# Patient Record
Sex: Female | Born: 1960
Health system: Southern US, Community
[De-identification: ages and names within clinical notes are randomized; demographics above are authoritative.]

## PROBLEM LIST (undated history)

## (undated) DIAGNOSIS — M899 Disorder of bone, unspecified: Secondary | ICD-10-CM

## (undated) DIAGNOSIS — M1990 Unspecified osteoarthritis, unspecified site: Secondary | ICD-10-CM

## (undated) DIAGNOSIS — Z860100 Personal history of colon polyps, unspecified: Secondary | ICD-10-CM

## (undated) DIAGNOSIS — T4145XA Adverse effect of unspecified anesthetic, initial encounter: Secondary | ICD-10-CM

## (undated) DIAGNOSIS — K219 Gastro-esophageal reflux disease without esophagitis: Secondary | ICD-10-CM

## (undated) DIAGNOSIS — K8012 Calculus of gallbladder with acute and chronic cholecystitis without obstruction: Principal | ICD-10-CM

## (undated) DIAGNOSIS — F329 Major depressive disorder, single episode, unspecified: Secondary | ICD-10-CM

## (undated) DIAGNOSIS — E785 Hyperlipidemia, unspecified: Secondary | ICD-10-CM

## (undated) DIAGNOSIS — I10 Essential (primary) hypertension: Secondary | ICD-10-CM

## (undated) DIAGNOSIS — N950 Postmenopausal bleeding: Secondary | ICD-10-CM

## (undated) DIAGNOSIS — G4733 Obstructive sleep apnea (adult) (pediatric): Secondary | ICD-10-CM

## (undated) DIAGNOSIS — F419 Anxiety disorder, unspecified: Secondary | ICD-10-CM

## (undated) DIAGNOSIS — K644 Residual hemorrhoidal skin tags: Secondary | ICD-10-CM

## (undated) DIAGNOSIS — K635 Polyp of colon: Secondary | ICD-10-CM

## (undated) DIAGNOSIS — K2950 Unspecified chronic gastritis without bleeding: Secondary | ICD-10-CM

## (undated) DIAGNOSIS — H269 Unspecified cataract: Secondary | ICD-10-CM

## (undated) DIAGNOSIS — T8859XA Other complications of anesthesia, initial encounter: Secondary | ICD-10-CM

## (undated) DIAGNOSIS — R7303 Prediabetes: Secondary | ICD-10-CM

## (undated) DIAGNOSIS — K8020 Calculus of gallbladder without cholecystitis without obstruction: Secondary | ICD-10-CM

## (undated) DIAGNOSIS — G4730 Sleep apnea, unspecified: Secondary | ICD-10-CM

## (undated) DIAGNOSIS — R928 Other abnormal and inconclusive findings on diagnostic imaging of breast: Secondary | ICD-10-CM

## (undated) DIAGNOSIS — R112 Nausea with vomiting, unspecified: Secondary | ICD-10-CM

## (undated) DIAGNOSIS — T7840XA Allergy, unspecified, initial encounter: Secondary | ICD-10-CM

## (undated) DIAGNOSIS — F32A Depression, unspecified: Secondary | ICD-10-CM

## (undated) DIAGNOSIS — D509 Iron deficiency anemia, unspecified: Secondary | ICD-10-CM

## (undated) DIAGNOSIS — G8929 Other chronic pain: Secondary | ICD-10-CM

## (undated) DIAGNOSIS — Z9889 Other specified postprocedural states: Secondary | ICD-10-CM

## (undated) HISTORY — DX: Allergy, unspecified, initial encounter: T78.40XA

## (undated) HISTORY — PX: OTHER SURGICAL HISTORY: SHX169

## (undated) HISTORY — PX: COLONOSCOPY WITH PROPOFOL: SHX5780

## (undated) HISTORY — DX: Unspecified cataract: H26.9

## (undated) HISTORY — DX: Polyp of colon: K63.5

## (undated) HISTORY — DX: Unspecified osteoarthritis, unspecified site: M19.90

## (undated) HISTORY — DX: Calculus of gallbladder without cholecystitis without obstruction: K80.20

## (undated) HISTORY — DX: Calculus of gallbladder with acute and chronic cholecystitis without obstruction: K80.12

## (undated) HISTORY — PX: ESOPHAGOGASTRODUODENOSCOPY (EGD) WITH PROPOFOL: SHX5813

## (undated) HISTORY — PX: BREAST BIOPSY: SHX20

## (undated) HISTORY — DX: Sleep apnea, unspecified: G47.30

## (undated) HISTORY — DX: Iron deficiency anemia, unspecified: D50.9

## (undated) HISTORY — PX: DRUG INDUCED ENDOSCOPY: SHX6808

## (undated) HISTORY — DX: Gastro-esophageal reflux disease without esophagitis: K21.9

## (undated) HISTORY — DX: Other abnormal and inconclusive findings on diagnostic imaging of breast: R92.8

## (undated) HISTORY — DX: Essential (primary) hypertension: I10

## (undated) HISTORY — PX: EYE SURGERY: SHX253

## (undated) HISTORY — DX: Disorder of bone, unspecified: M89.9

---

## 2003-09-26 ENCOUNTER — Encounter: Admission: RE | Admit: 2003-09-26 | Discharge: 2003-09-26 | Payer: Self-pay | Admitting: Internal Medicine

## 2004-05-06 ENCOUNTER — Emergency Department (HOSPITAL_COMMUNITY): Admission: EM | Admit: 2004-05-06 | Discharge: 2004-05-06 | Payer: Self-pay | Admitting: Emergency Medicine

## 2007-08-21 ENCOUNTER — Ambulatory Visit: Payer: Self-pay | Admitting: Family Medicine

## 2007-08-23 ENCOUNTER — Ambulatory Visit: Payer: Self-pay | Admitting: *Deleted

## 2007-08-23 ENCOUNTER — Ambulatory Visit (HOSPITAL_COMMUNITY): Admission: RE | Admit: 2007-08-23 | Discharge: 2007-08-23 | Payer: Self-pay | Admitting: Family Medicine

## 2007-11-02 ENCOUNTER — Ambulatory Visit: Payer: Self-pay | Admitting: Family Medicine

## 2007-11-02 LAB — CONVERTED CEMR LAB
ALT: 15 units/L (ref 0–35)
AST: 18 units/L (ref 0–37)
Albumin: 4.2 g/dL (ref 3.5–5.2)
Alkaline Phosphatase: 115 units/L (ref 39–117)
BUN: 9 mg/dL (ref 6–23)
Basophils Absolute: 0.1 10*3/uL (ref 0.0–0.1)
Basophils Relative: 1 % (ref 0–1)
CO2: 19 meq/L (ref 19–32)
Calcium: 9 mg/dL (ref 8.4–10.5)
Chloride: 105 meq/L (ref 96–112)
Creatinine, Ser: 0.74 mg/dL (ref 0.40–1.20)
Eosinophils Absolute: 0.3 10*3/uL (ref 0.0–0.7)
Eosinophils Relative: 3 % (ref 0–5)
Glucose, Bld: 79 mg/dL (ref 70–99)
HCT: 34.1 % — ABNORMAL LOW (ref 36.0–46.0)
Helicobacter Pylori Antibody-IgG: 4.4 — ABNORMAL HIGH
Hemoglobin: 10 g/dL — ABNORMAL LOW (ref 12.0–15.0)
Lymphocytes Relative: 32 % (ref 12–46)
Lymphs Abs: 2.5 10*3/uL (ref 0.7–4.0)
MCHC: 29.3 g/dL — ABNORMAL LOW (ref 30.0–36.0)
MCV: 71.6 fL — ABNORMAL LOW (ref 78.0–100.0)
Monocytes Absolute: 0.4 10*3/uL (ref 0.1–1.0)
Monocytes Relative: 6 % (ref 3–12)
Neutro Abs: 4.5 10*3/uL (ref 1.7–7.7)
Neutrophils Relative %: 58 % (ref 43–77)
Platelets: 304 10*3/uL (ref 150–400)
Potassium: 4.8 meq/L (ref 3.5–5.3)
RBC: 4.76 M/uL (ref 3.87–5.11)
RDW: 17.1 % — ABNORMAL HIGH (ref 11.5–15.5)
Sodium: 141 meq/L (ref 135–145)
Total Bilirubin: 0.3 mg/dL (ref 0.3–1.2)
Total Protein: 7.5 g/dL (ref 6.0–8.3)
WBC: 7.8 10*3/uL (ref 4.0–10.5)

## 2008-02-11 ENCOUNTER — Ambulatory Visit: Payer: Self-pay | Admitting: Family Medicine

## 2008-09-11 ENCOUNTER — Ambulatory Visit: Payer: Self-pay | Admitting: Family Medicine

## 2008-09-16 ENCOUNTER — Ambulatory Visit (HOSPITAL_COMMUNITY): Admission: RE | Admit: 2008-09-16 | Discharge: 2008-09-16 | Payer: Self-pay | Admitting: Family Medicine

## 2009-02-19 ENCOUNTER — Ambulatory Visit: Payer: Self-pay | Admitting: Family Medicine

## 2009-03-19 ENCOUNTER — Encounter: Payer: Self-pay | Admitting: Internal Medicine

## 2009-03-19 ENCOUNTER — Ambulatory Visit: Payer: Self-pay | Admitting: Internal Medicine

## 2009-03-19 ENCOUNTER — Ambulatory Visit (HOSPITAL_COMMUNITY): Admission: RE | Admit: 2009-03-19 | Discharge: 2009-03-19 | Payer: Self-pay | Admitting: Internal Medicine

## 2009-03-19 DIAGNOSIS — D509 Iron deficiency anemia, unspecified: Secondary | ICD-10-CM

## 2009-03-19 DIAGNOSIS — R0789 Other chest pain: Secondary | ICD-10-CM | POA: Insufficient documentation

## 2010-02-10 ENCOUNTER — Emergency Department (HOSPITAL_COMMUNITY): Admission: EM | Admit: 2010-02-10 | Discharge: 2010-02-10 | Payer: Self-pay | Admitting: Family Medicine

## 2010-02-12 ENCOUNTER — Ambulatory Visit: Payer: Self-pay | Admitting: Family Medicine

## 2010-02-12 LAB — CONVERTED CEMR LAB
Basophils Absolute: 0.1 10*3/uL (ref 0.0–0.1)
Basophils Relative: 1 % (ref 0–1)
Eosinophils Absolute: 0.2 10*3/uL (ref 0.0–0.7)
Eosinophils Relative: 2 % (ref 0–5)
HCT: 42 % (ref 36.0–46.0)
Hemoglobin: 13.5 g/dL (ref 12.0–15.0)
INR: 1.01 (ref <1.50)
Lymphocytes Relative: 34 % (ref 12–46)
Lymphs Abs: 2.5 10*3/uL (ref 0.7–4.0)
MCHC: 32.1 g/dL (ref 30.0–36.0)
MCV: 86.1 fL (ref 78.0–100.0)
Monocytes Absolute: 0.5 10*3/uL (ref 0.1–1.0)
Monocytes Relative: 7 % (ref 3–12)
Neutro Abs: 4.1 10*3/uL (ref 1.7–7.7)
Neutrophils Relative %: 56 % (ref 43–77)
Platelets: 300 10*3/uL (ref 150–400)
Prothrombin Time: 13.2 s (ref 11.6–15.2)
RBC: 4.88 M/uL (ref 3.87–5.11)
RDW: 13.2 % (ref 11.5–15.5)
WBC: 7.4 10*3/uL (ref 4.0–10.5)
aPTT: 30 s (ref 24–37)

## 2010-02-18 ENCOUNTER — Ambulatory Visit (HOSPITAL_COMMUNITY): Admission: RE | Admit: 2010-02-18 | Discharge: 2010-02-18 | Payer: Self-pay | Admitting: Family Medicine

## 2010-05-13 ENCOUNTER — Ambulatory Visit: Payer: Self-pay | Admitting: Family Medicine

## 2010-05-13 LAB — CONVERTED CEMR LAB
Chlamydia, DNA Probe: NEGATIVE
GC Probe Amp, Genital: NEGATIVE

## 2010-05-26 ENCOUNTER — Ambulatory Visit (HOSPITAL_COMMUNITY): Admission: RE | Admit: 2010-05-26 | Discharge: 2010-05-26 | Payer: Self-pay | Admitting: Family Medicine

## 2010-05-27 ENCOUNTER — Ambulatory Visit: Payer: Self-pay | Admitting: Internal Medicine

## 2010-05-27 ENCOUNTER — Encounter (INDEPENDENT_AMBULATORY_CARE_PROVIDER_SITE_OTHER): Payer: Self-pay | Admitting: Adult Health

## 2010-05-27 LAB — CONVERTED CEMR LAB
BUN: 8 mg/dL (ref 6–23)
CO2: 24 meq/L (ref 19–32)
Calcium: 10.1 mg/dL (ref 8.4–10.5)
Chloride: 104 meq/L (ref 96–112)
Creatinine, Ser: 0.58 mg/dL (ref 0.40–1.20)
Glucose, Bld: 96 mg/dL (ref 70–99)
Potassium: 4 meq/L (ref 3.5–5.3)
Sodium: 142 meq/L (ref 135–145)

## 2010-07-13 ENCOUNTER — Ambulatory Visit: Payer: Self-pay | Admitting: Family Medicine

## 2010-07-13 LAB — CONVERTED CEMR LAB
HCV Ab: NEGATIVE
Hep A Total Ab: POSITIVE — AB
Hep B Core Total Ab: NEGATIVE
Hep B S Ab: NEGATIVE
Rheumatoid fact SerPl-aCnc: <20 intl units/mL (ref 0–20)
Sed Rate: 1 mm/hr (ref 0–22)

## 2011-03-31 ENCOUNTER — Encounter: Payer: Self-pay | Admitting: Gastroenterology

## 2011-05-11 ENCOUNTER — Ambulatory Visit: Payer: Self-pay | Admitting: Gastroenterology

## 2011-08-25 ENCOUNTER — Other Ambulatory Visit (HOSPITAL_COMMUNITY): Payer: Self-pay | Admitting: Family Medicine

## 2011-08-25 DIAGNOSIS — R1011 Right upper quadrant pain: Secondary | ICD-10-CM

## 2011-08-29 ENCOUNTER — Ambulatory Visit (HOSPITAL_COMMUNITY)
Admission: RE | Admit: 2011-08-29 | Discharge: 2011-08-29 | Disposition: A | Discharge status: Home / Self Care | Payer: Self-pay | Source: Ambulatory Visit | Attending: Family Medicine | Admitting: Family Medicine

## 2011-08-29 DIAGNOSIS — K7689 Other specified diseases of liver: Secondary | ICD-10-CM | POA: Insufficient documentation

## 2011-08-29 DIAGNOSIS — R1011 Right upper quadrant pain: Secondary | ICD-10-CM

## 2011-09-19 ENCOUNTER — Encounter (INDEPENDENT_AMBULATORY_CARE_PROVIDER_SITE_OTHER): Payer: Self-pay | Admitting: Surgery

## 2011-09-19 ENCOUNTER — Ambulatory Visit (INDEPENDENT_AMBULATORY_CARE_PROVIDER_SITE_OTHER): Payer: PRIVATE HEALTH INSURANCE | Admitting: Surgery

## 2011-09-19 VITALS — BP 120/84 | HR 60 | Temp 97.4°F | Resp 18 | Ht 61.5 in | Wt 166.0 lb

## 2011-09-19 DIAGNOSIS — K625 Hemorrhage of anus and rectum: Secondary | ICD-10-CM | POA: Insufficient documentation

## 2011-09-19 DIAGNOSIS — K648 Other hemorrhoids: Secondary | ICD-10-CM | POA: Insufficient documentation

## 2011-09-19 DIAGNOSIS — K8010 Calculus of gallbladder with chronic cholecystitis without obstruction: Secondary | ICD-10-CM

## 2011-09-19 DIAGNOSIS — K219 Gastro-esophageal reflux disease without esophagitis: Secondary | ICD-10-CM | POA: Insufficient documentation

## 2011-09-19 DIAGNOSIS — K8012 Calculus of gallbladder with acute and chronic cholecystitis without obstruction: Secondary | ICD-10-CM | POA: Insufficient documentation

## 2011-09-19 NOTE — Progress Notes (Signed)
Subjective:     Patient ID: Susan Lopez, female   DOB: Aug 21, 1961, 50 y.o.   MRN: [id number]  HPI  Patient Care Team: Collene Leyden as PCP - General (Family Medicine)  This patient is a 50 y.o.female who presents today for surgical evaluation.   Reason for visit: Gallstones and biliary colic.  Patient is a 50 year old female who has numerous abdominal issues. She has history of reflux disease it seems to be well-controlled with omeprazole and Carafate. She also history of rectal bleeding. She's never had a colonoscopy. She struggles with chronic constipation. She occasionally will use laxatives to help her have bowel movements. She's never been on a bowel regimen.  Her main complaint has been intermittent right upper cautery postprandial abdominal pain. She often will get bloating. Seems to happen with more heavy foods but it's hard to tease out what diet bothers her more than not. She will get discomfort when she bent over well. This pain is different than heartburn or reflux. No sick contacts or travel history.  She does not speak [place]. She comes in with her niece who is bilingual. They prefer the niece to be interpreter.  Past Medical History  Diagnosis Date  . Iron deficiency anemia   . Hypertension   . Arthritis   . Gallstones     Past Surgical History  Procedure Date  . Cesarean section 1985, 1992    History   Social History  . Marital Status: Single    Spouse Name: N/A    Number of Children: N/A  . Years of Education: N/A   Occupational History  . Not on file.   Social History Main Topics  . Smoking status: Former Games developer  . Smokeless tobacco: Not on file  . Alcohol Use: No  . Drug Use: No  . Sexually Active:    Other Topics Concern  . Not on file   Social History Narrative  . No narrative on file    History reviewed. No pertinent family history.  Current outpatient prescriptions:Bisacodyl (LAXATIVE PO), Take 5 mg by mouth daily.  , Disp:  , Rfl: ;  lisinopril (PRINIVIL,ZESTRIL) 10 MG tablet, Take 10 mg by mouth daily.  , Disp: , Rfl: ;  omeprazole (PRILOSEC) 20 MG capsule, Take 20 mg by mouth daily.  , Disp: , Rfl:   No Known Allergies     Review of Systems  Constitutional: Negative for fever, chills, diaphoresis, appetite change and fatigue.  HENT: Positive for sore throat and trouble swallowing. Negative for ear pain, neck pain and ear discharge.   Eyes: Negative for photophobia, discharge and visual disturbance.  Respiratory: Negative for cough, choking, chest tightness, shortness of breath, wheezing and stridor.   Cardiovascular: Positive for chest pain. Negative for palpitations and leg swelling.       Patient walks 30 minutes for about without difficulty.  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  No exertional chest/neck/shoulder/arm pain.  Gastrointestinal: Positive for nausea, abdominal pain, constipation, abdominal distention, anal bleeding and rectal pain. Negative for vomiting and diarrhea.  Genitourinary: Negative for dysuria, urgency, frequency, decreased urine volume, vaginal bleeding, vaginal discharge and difficulty urinating.       No personal nor family history of GI/colon cancer, inflammatory bowel disease, irritable bowel syndrome, allergy such as Celiac Sprue, dietary/dairy problems, colitis, ulcers nor gastritis.    No recent sick contacts/gastroenteritis.  No travel outside the country.  No changes in diet.    Musculoskeletal: Positive for arthralgias. Negative for myalgias and gait  problem.  Skin: Negative for color change, pallor and rash.  Neurological: Negative for dizziness, speech difficulty, weakness and numbness.  Hematological: Negative for adenopathy.  Psychiatric/Behavioral: Negative for confusion and agitation. The patient is not nervous/anxious.        Objective:   Physical Exam  Constitutional: She is oriented to person, place, and time. She appears well-developed and well-nourished. No  distress.  HENT:  Head: Normocephalic.  Mouth/Throat: Oropharynx is clear and moist. No oropharyngeal exudate.  Eyes: Conjunctivae and EOM are normal. Pupils are equal, round, and reactive to light. No scleral icterus.  Neck: Normal range of motion. Neck supple. No tracheal deviation present.  Cardiovascular: Normal rate, regular rhythm and intact distal pulses.   Pulmonary/Chest: Effort normal and breath sounds normal. No respiratory distress. She exhibits no tenderness.  Abdominal: Soft. She exhibits no distension and no mass. There is no rebound and no guarding. Hernia confirmed negative in the right inguinal area and confirmed negative in the left inguinal area.       Obese.  RUQ TTP, no major Murphy's sign  Genitourinary: No vaginal discharge found.       Perianal skin clean with good hygiene.  No pruritis.  No pilonidal disease.  No fissure.  No abscess/fistula.    Tolerates digital and anoscopic rectal exam but sensitive.  Normal sphincter tone.  No rectal masses.    Hemorrhoidal piles R post > L lat mildly enlarged. R ant very mild   Musculoskeletal: Normal range of motion. She exhibits no tenderness.  Lymphadenopathy:    She has no cervical adenopathy.       Right: No inguinal adenopathy present.       Left: No inguinal adenopathy present.  Neurological: She is alert and oriented to person, place, and time. No cranial nerve deficit. She exhibits normal muscle tone. Coordination normal.  Skin: Skin is warm and dry. No rash noted. She is not diaphoretic. No erythema.  Psychiatric: She has a normal mood and affect. Her behavior is normal. Judgment and thought content normal.   Studies: A 2 cm large gallstone on ultrasound. Questionable Murphy's sign. No gallbladder wall thickening or ductal dilatation    Assessment:     Chronic cholecystitis + gallstone  GERD - controlled on meds.  No Hiatal hernia on 2009 UGI  Anorectal bleeding, most likely due to hemorrhoids    Plan:      Continue PPIs and Carafate for GERD. Consider reevaluation after cholecystectomy.  Consider laparoscopic cholecystectomy. I think she is reasonable to do a single site technique. She may need conversion to multiple port. I did discuss technique:  The anatomy & physiology of hepatobiliary & pancreatic function was discussed.  The pathophysiology of gallbladder dysfunction was discussed.  Natural history risks without surgery was discussed.   I feel the risks of no intervention will lead to serious problems that outweigh the operative risks; therefore, I recommended cholecystectomy to remove the pathology.  I explained laparoscopic techniques with possible need for an open approach.  Probable cholangiogram to evaluate the bilary tract was explained as well.    Risks such as bleeding, infection, abscess, leak, injury to other organs, need for further treatment, heart attack, death, and other risks were discussed.  I noted a good likelihood this will help address the problem.  Possibility that this will not correct all abdominal symptoms was explained.  Goals of post-operative recovery were discussed as well.  We will work to minimize complications.  An educational handout further explaining  the pathology and treatment options was given as well.  Questions were answered.  The patient expresses understanding & wishes to proceed with surgery.  Hemorrhoids most likely etiology of rectal bleeding. However, she is over 50. And his reason for a colonoscopy. I discussed that with him into recommended. They are wishing to hold off for now until after surgery. I noted rectum persistent rectal bleeding is normal. I held off on doing any bending or injections as she is rather sensitive for now and is not have a good bowel regimen.   I gave her a handout on cholecystectomy, hemorrhoids and, on bowel regimen area.  These were in Spanish as well

## 2011-09-19 NOTE — Patient Instructions (Addendum)
Colecistectoma laparoscpica  (Laparoscopic Cholecystectomy)  La colecistectoma laparoscpica es una ciruga que se realiza para extirpar la vescula biliar. Est ubicada ligeramente a la derecha del centro del vientre (abdomen ), detrs del hgado. Almacena la bilis que se produce en el hgado. La bilis interviene en la digestin y absorcin de las grasas. La enfermedad de la vescula biliar (colecistitis) es la inflamacin de la vescula biliar. Esta enfermedad se debe a la acumulacin de piedras (colelitiasis). Estas piedras obstruyen el flujo de la bilis, lo que produce inflamacin y Engineer, mining. En los [employer], podr ser [place]. Cuando no es [place] operacin de [place], usted tendr tiempo de prepararse para el procedimiento. La ciruga laparoscpica es una alternativa a la ciruga abierta convencional, y normalmente requiere menos tiempo de Customer service manager. El conducto bliliar comn tambin debe examinarse y explorarse. El mdico comentar esto con usted si considera que debe [place]. Si se encuentran clculos en el conducto comn, debern extirparse. HGALE SABER A SU MDICO:   Alergias a alimentos o medicamentos.   Medicamentos que [place], incluyendo vitaminas, hierbas, gotas oftlmicas, medicamentos de venta libre y cremas.   Uso de corticoides (por va oral o cremas).   Problemas anteriores debido a anestsicos o a medicamentos que [employer] sensibilidad.   Antecedentes de hemorragias o cogulos sanguneos   Cirugas anteriores.   Otros problemas de salud, incluyendo diabetes y problemas renales.   Posibilidad de embarazo, si corresponde.  RIESGOS Y COMPLICACIONES  Todas las cirugas conllevan riesgos. Algunos problemas que pueden ocurrir luego de la intervencin son:   Infecciones.   Daos en el conducto biliar comn, nervios, arterias, venas u otros rganos internos como el [address] o los intestinos.   Hemorragias   Una piedra puede  quedar en el conducto biliar comn.  ANTES DEL PROCEDIMIENTO   No tome aspirina durante los [address] a la [place], ni anticoagulantes durante 1 semana antes de la [place].   No coma ni beba nada despus de la medianoche anterior a la ciruga.   Informe al profesional que lo asiste si se resfra o sufre algn problema infeccioso antes de la [place].   Deber presentarse 60 minutos antes del procedimiento, o segn se lo indiquen.  PROCEDIMIENTO Le administrarn un medicamento que lo har dormir (anestesia general). Una vez que est dormido, el mdico realizar varios cortes pequeos (incisin) en en el abdomen. Una de estas incisiones se utilizan para insertar un telescopio pequeo, con luz (laparoscopio) en el abdomen. El laparoscopio le permite al cirujano observar el interior del abdomen. Le insertarn gas dixido de carbono en el abdomen. Esto tambin dar lugar al cirujano para Retail buyer. Otros instrumentos quirrgicos utilizados para llevar a cabo la ciruga se insertarn a travs de las otras incisiones.  La laparoscopia puede no ser apropiada cuando:  Hay una cicatriz importante de una operacin anterior.   La vescula est muy inflamada.   Hay trastornos hemorrgios o cirrosis en el hgado.   Si tiene Chartered loss adjuster de trmino.   Existen otras enfermedades que imposibilitan el procedimiento laparoscpico.  El cirujano determina que no es posible continuar con el procedimiento laparoscpico. Si esto sucede, realizar una ciruga abdominal abierta. En este caso, el cirujano realizar una incisin para ingresar al abdomen. Esto le dar Neomia Dear mayor visin y campo para trabajar en l. Le permitir al Nicholes Mango realizar procedimientos que a veces no pueden realizarse con un laparoscopio. Este tipo de [place] requiere un tiempo ms prolongado de Customer service manager.  DESPUS DEL PROCEDIMIENTO   Despus de la ciruga lo llevarn al rea de recuperacin donde un(a) enfermero(a) lo  cuidar y Chief Operating Officer su evolucin.   Podr volver a Sales promotion account executive.   No reanude la actividad fsica hasta que se lo indique el mdico.   Puede reanudar su dieta y sus actividades normales segn se le haya indicado.  Document Released: 10/17/2005 Document Revised: 06/29/2011 [facility] Patient Information 2012 [place], [place].  Hemorroides  (Hemorrhoids) Las hemorroides son venas agrandadas (dilatadas) alrededor del recto. Hay 2 tipos de hemorroides, que se determina por su ubicacin. Las hemorroides internas, que son las que se encuentran dentro del recto.Generalmente no duelen, Charity fundraiser.Sin embargo, pueden hincharse y salir por el recto, entonces se irritan y duelen. Las hemorroides externas incluyen las venas externas del ano, y se sienten como un bulto duro y que duele, cerca del ano.Muchas veces pican, y pueden romperse y Geophysicist/field seismologist. En algunos casos se forman cogulos en las venas. Esto hace que se hinchen y duelan. Esto se suele denominar trombosis hemorroidal.  CAUSAS  Las causas de hemorroides son:   Vanetta Mulders. El embarazo aumenta la presin en las venas hemorroidales.   Constipacin   Dificultad para mover el intestino   Obesidad.   Levantar pesas u otras actividades que impliquen esfuerzo.  TRATAMIENTO  La mayora de los casos de hemorroides mejoran en 1 a 2 semanas. Sin embargo, si los sntomas no mejoran o tiene Runner, broadcasting/film/video, el mdico puede Education officer, environmental un procedimiento para disminuir las hemorroides o extirparlas completamente.Los tratamientos posibles son:   Abigail Butts con Neomia Dear banda de goma. Se coloca una banda de goma en la base de la hemorroides para cortar la circulacin.   Escleroterapia Se inyecta una sustancia qumica para disminuir el tamao de la hemorroides.   Terapia con luz infrarroja. Se utiliza un instrumento para quemar la hemorroides.   Hemorroidectoma Es la remocin quirrgica de la hemorroides.  INSTRUCCIONES PARA EL CUIDADO EN EL HOGAR     Agregue fibra a su dieta. Consulte con su mdico acerca del uso de suplementos con fibras.   Beba gran cantidad de lquido para mantener la orina de tono claro o color amarillo plido.   Haga ejercicios regularmente.   Vaya al bao cuando sienta la necesidad de mover el intestino. Noespere.   Evite hacer fuerza al mover el intestino.   Mantenga la zona anal limpia y seca.   Solo tome medicamentos que se pueden comprar sin receta o recetados para Chief Technology Officer, Dentist o fiebre, como le indica el mdico.  Si la hemorroides est trombosada:   Tome baos de asiento calientes durante 20 a 30 minutos, 3 a 4 veces por da.   Si le duele y est hinchada, coloque compresas con hielo en la zona, segn la tolerancia. Usar las compresas de [employer] baos de asiento puede ser [place]. Llene una bolsa plstica con hielo. Coloque una toalla entre la bolsa de hielo y la piel.   Puede usar o Contractor segn las indicaciones algunas cremas especiales y supositorios.   No utilice una almohada en forma de aro ni se siente en el inodoro durante perodos prolongados. Esto aumenta la afluencia de sangre y Chief Technology Officer.  SOLICITE ATENCIN MDICA SI:   Aumenta el dolor y la hinchazn, y no puede controlarlo con Tourist information centre manager.   Tiene un sangrado que no puede parar.   No puede mover el intestino.   Siente dolor o tiene inflamacin fuera de la zona de  las hemorroides.   Tiene escalofros o una temperatura oral mayor a 102 F (38.9 C).  ASEGRESE DE QUE:   Comprende estas instrucciones.   Controlar su enfermedad.   Solicitar ayuda de inmediato si no mejora o si empeora.  Document Released: 10/17/2005 Document Revised: 06/29/2011 [facility] Patient Information 2012 [place], [place].  Colonoscopa (Colonoscopy) El profesional que lo asiste le ha ordenado una colonoscopa. Es un examen para evaluar el colon en su totalidad. Durante este examen se limpia el colon. Luego se inserta un tubo largo de  fibras pticas en el recto y el colon. El tubo de fibras pticas (fibroscopio, endoscopio) es un largo haz de fibras unidas y Vincent flexibles. Estas fibras transmiten luz hacia la zona examinada y envan las imgenes para que el profesional las observe. Las molestias son mnimas. Podrn administrarle un sedante suave que lo ayudar a dormir antes y Teacher, adult education. Este examen tambin ayuda a Engineer, manufacturing bultos (tumores), plipos, inflamacin y reas de [place]. El profesional extraer una pequea pieza de tejido (biopsia) que ser examinada en el microscopio. INFORME AL PROFESIONAL SOBRE LOS SIGUIENTES PUNTOS:  Alergias.   Medicamentos que toma, incluyendo hierbas, gotas oftlmicas, medicamentos de venta libre y cremas.   Uso de esteroides (por va oral o cremas).   Problemas anteriores debido a anestsicos.   Posibilidad de embarazo, si correspondiera.   Antecedentes de haber tenido cogulos sanguneos (tromboflebitis) o antecedentes de hemorragias o problemas sanguneos.   Cirugas previas.   Otros problemas de [place].  ANTES DEL PROCEDIMIENTO  [facility] antes del estudio deber someterse a [place]   Consulte a su mdico si debe cambiar o suspender los medicamentos que toma habitualmente.   Le requerirn que se aplique enemas o tome un purgante.   Tendr que beber una gran cantidad de solucin electroltica durante un breve perodo de [place]. Esta solucin ayuda a limpiar el colon.   Deber presentarse 60 minutos antes del procedimiento a menos que el profesional que lo asiste le indique otra cosa.  DESPUS DEL PROCEDIMIENTO  Si le administraron un sedante o un analgsico, deber solicitar a alguna persona que lo lleve hasta su casa.   En algunos casos se observar un pequeo cogulo la primera vez que haga la deposicin. NO debe preocuparse.  AVERIGUE LOS RESULTADOS DE SU ESTUDIO Durante su visita no contar con todos los [employer]. En este caso,  tenga otra entrevista con su mdico para conocerlos. No piense que el resultado es normal si no tiene noticias de su mdico o de la institucin mdica. Es Copy seguimiento de todos los [place]. INSTRUCCIONES PARA EL CUIDADO DOMICILIARIO  No es infrecuente eliminar una cantidad moderada de gases y experimentar ligeros clicos abdominales luego del procedimiento. Esto se debe al aire que se le ha insuflado en el colon durante el examen. [place] o colquese una almohadilla trmica en el abdomen (vientre).   Podr retornar a sus comidas y actividades habituales despus que se le pase el efecto de los sedantes y los medicamentos.   Utilice los medicamentos de venta libre o de prescripcin para Chief Technology Officer, Environmental health practitioner o la [place], segn se lo indique el profesional que lo asiste. NO tome aspirina o anticoagulantes si le tomaron una biopsia. Consulte al profesional que lo asiste acerca del uso de medicamentos en caso que le hayan practicado una biopsia.  SOLICITE ATENCIN MDICA DE INMEDIATO SI:  Tiene fiebre.   Elimina grandes cogulos o elimina sangre luego  del procedimiento. Esto puede sucederle hasta 10 a 14 das posteriores al procedimiento. Es ms probable que ocurra si le han practicado una biopsia.   Siente dolor abdominal que empeora y no puede aliviarse con los medicamentos.  Document Released: 07/27/2005 Document Revised: 06/29/2011 [facility] Patient Information 2012 [place], [place].

## 2011-09-26 ENCOUNTER — Encounter (INDEPENDENT_AMBULATORY_CARE_PROVIDER_SITE_OTHER): Payer: Self-pay | Admitting: Family Medicine

## 2011-10-14 ENCOUNTER — Encounter (HOSPITAL_COMMUNITY): Payer: Self-pay | Admitting: Pharmacy Technician

## 2011-10-21 ENCOUNTER — Other Ambulatory Visit: Payer: Self-pay

## 2011-10-21 ENCOUNTER — Encounter (HOSPITAL_COMMUNITY): Payer: Self-pay

## 2011-10-21 ENCOUNTER — Ambulatory Visit (HOSPITAL_COMMUNITY)
Admission: RE | Admit: 2011-10-21 | Discharge: 2011-10-21 | Disposition: A | Discharge status: Home / Self Care | Payer: Self-pay | Source: Ambulatory Visit | Attending: Surgery | Admitting: Surgery

## 2011-10-21 ENCOUNTER — Encounter (HOSPITAL_COMMUNITY)
Admission: RE | Admit: 2011-10-21 | Discharge: 2011-10-21 | Payer: Self-pay | Source: Ambulatory Visit | Attending: Surgery | Admitting: Surgery

## 2011-10-21 DIAGNOSIS — Z01818 Encounter for other preprocedural examination: Secondary | ICD-10-CM | POA: Insufficient documentation

## 2011-10-21 DIAGNOSIS — K644 Residual hemorrhoidal skin tags: Secondary | ICD-10-CM

## 2011-10-21 DIAGNOSIS — Z01812 Encounter for preprocedural laboratory examination: Secondary | ICD-10-CM | POA: Insufficient documentation

## 2011-10-21 DIAGNOSIS — Z01810 Encounter for preprocedural cardiovascular examination: Secondary | ICD-10-CM | POA: Insufficient documentation

## 2011-10-21 HISTORY — DX: Residual hemorrhoidal skin tags: K64.4

## 2011-10-21 HISTORY — PX: EYE SURGERY: SHX253

## 2011-10-21 LAB — BASIC METABOLIC PANEL
CO2: 27 mEq/L (ref 19–32)
Calcium: 9.5 mg/dL (ref 8.4–10.5)
Chloride: 105 mEq/L (ref 96–112)
Creatinine, Ser: 0.61 mg/dL (ref 0.50–1.10)
GFR calc Af Amer: >90 mL/min (ref >90)
Sodium: 140 mEq/L (ref 135–145)

## 2011-10-21 LAB — SURGICAL PCR SCREEN
MRSA, PCR: INVALID — AB
Staphylococcus aureus: INVALID — AB

## 2011-10-21 LAB — CBC
MCH: 30.6 pg (ref 26.0–34.0)
Platelets: 254 10*3/uL (ref 150–400)
RBC: 4.61 MIL/uL (ref 3.87–5.11)
RDW: 12.6 % (ref 11.5–15.5)
WBC: 7.2 10*3/uL (ref 4.0–10.5)

## 2011-10-21 NOTE — Patient Instructions (Addendum)
20 Susan Lopez  10/21/2011   Your procedure is scheduled on:  10-27-11  Report to [facility] at 0745 AM.  Call this number if you have problems the morning of surgery: [phone]   Remember:   Do not eat food:After Midnight.  May have clear liquids:until Midnight .  Clear liquids include soda, tea, black coffee, apple or grape juice, broth.  Take these medicines the morning of surgery with A SIP OF WATER:Omeprazole, may use eye drops   Do not wear jewelry, make-up or nail polish.  Do not wear lotions, powders, or perfumes. You may wear deodorant.  Do not shave 48 hours prior to surgery.  Do not bring valuables to the hospital.  Contacts, dentures or bridgework may not be worn into surgery.  Leave suitcase in the car. After surgery it may be brought to your room.  For patients admitted to the hospital, checkout time is 11:00 AM the day of discharge.   Patients discharged the day of surgery will not be allowed to drive home.  Name and phone number of your driver:  Transportation [phone]  Special Instructions: CHG Shower Use Special Wash: 1/2 bottle night before surgery and 1/2 bottle morning of surgery.   Please read over the following fact sheets that you were given: MRSA Information

## 2011-10-21 NOTE — Pre-Procedure Instructions (Signed)
10-21-11 [facility], interpreter with patient today and is willing to to be with pt. Day of surgery. Will inform language services of this.

## 2011-10-24 LAB — MRSA CULTURE

## 2011-10-27 ENCOUNTER — Encounter (HOSPITAL_COMMUNITY): Payer: Self-pay | Admitting: *Deleted

## 2011-10-27 ENCOUNTER — Ambulatory Visit (HOSPITAL_COMMUNITY): Payer: Self-pay

## 2011-10-27 ENCOUNTER — Other Ambulatory Visit (INDEPENDENT_AMBULATORY_CARE_PROVIDER_SITE_OTHER): Payer: Self-pay | Admitting: Surgery

## 2011-10-27 ENCOUNTER — Ambulatory Visit (HOSPITAL_COMMUNITY)
Admission: RE | Admit: 2011-10-27 | Discharge: 2011-10-27 | Disposition: A | Discharge status: Home / Self Care | Payer: Self-pay | Source: Ambulatory Visit | Attending: Surgery | Admitting: Surgery

## 2011-10-27 ENCOUNTER — Encounter (HOSPITAL_COMMUNITY)
Admission: RE | Disposition: A | Discharge status: Home / Self Care | Payer: Self-pay | Source: Ambulatory Visit | Attending: Surgery

## 2011-10-27 ENCOUNTER — Encounter (HOSPITAL_COMMUNITY): Payer: Self-pay | Admitting: Anesthesiology

## 2011-10-27 ENCOUNTER — Ambulatory Visit (HOSPITAL_COMMUNITY): Payer: Self-pay | Admitting: Anesthesiology

## 2011-10-27 DIAGNOSIS — I10 Essential (primary) hypertension: Secondary | ICD-10-CM | POA: Insufficient documentation

## 2011-10-27 DIAGNOSIS — J029 Acute pharyngitis, unspecified: Secondary | ICD-10-CM | POA: Insufficient documentation

## 2011-10-27 DIAGNOSIS — K219 Gastro-esophageal reflux disease without esophagitis: Secondary | ICD-10-CM | POA: Insufficient documentation

## 2011-10-27 DIAGNOSIS — K625 Hemorrhage of anus and rectum: Secondary | ICD-10-CM | POA: Insufficient documentation

## 2011-10-27 DIAGNOSIS — D509 Iron deficiency anemia, unspecified: Secondary | ICD-10-CM | POA: Insufficient documentation

## 2011-10-27 DIAGNOSIS — R142 Eructation: Secondary | ICD-10-CM | POA: Insufficient documentation

## 2011-10-27 DIAGNOSIS — R1011 Right upper quadrant pain: Secondary | ICD-10-CM | POA: Insufficient documentation

## 2011-10-27 DIAGNOSIS — R1310 Dysphagia, unspecified: Secondary | ICD-10-CM | POA: Insufficient documentation

## 2011-10-27 DIAGNOSIS — R141 Gas pain: Secondary | ICD-10-CM | POA: Insufficient documentation

## 2011-10-27 DIAGNOSIS — R0789 Other chest pain: Secondary | ICD-10-CM | POA: Insufficient documentation

## 2011-10-27 DIAGNOSIS — K8000 Calculus of gallbladder with acute cholecystitis without obstruction: Secondary | ICD-10-CM | POA: Insufficient documentation

## 2011-10-27 DIAGNOSIS — R143 Flatulence: Secondary | ICD-10-CM | POA: Insufficient documentation

## 2011-10-27 DIAGNOSIS — K6289 Other specified diseases of anus and rectum: Secondary | ICD-10-CM | POA: Insufficient documentation

## 2011-10-27 DIAGNOSIS — K8010 Calculus of gallbladder with chronic cholecystitis without obstruction: Secondary | ICD-10-CM

## 2011-10-27 DIAGNOSIS — K7689 Other specified diseases of liver: Secondary | ICD-10-CM | POA: Insufficient documentation

## 2011-10-27 DIAGNOSIS — K5900 Constipation, unspecified: Secondary | ICD-10-CM | POA: Insufficient documentation

## 2011-10-27 DIAGNOSIS — M2550 Pain in unspecified joint: Secondary | ICD-10-CM | POA: Insufficient documentation

## 2011-10-27 HISTORY — PX: CHOLECYSTECTOMY: SHX55

## 2011-10-27 SURGERY — LAPAROSCOPIC CHOLECYSTECTOMY WITH INTRAOPERATIVE CHOLANGIOGRAM
Anesthesia: General | Site: Abdomen | Wound class: Dirty or Infected

## 2011-10-27 MED ORDER — ACETAMINOPHEN 650 MG RE SUPP
650.0000 mg | RECTAL | Status: DC | PRN
  Filled 2011-10-27: qty 1

## 2011-10-27 MED ORDER — OXYCODONE HCL 5 MG PO TABS: 5.0000 mg | ORAL_TABLET | ORAL | Status: DC | PRN

## 2011-10-27 MED ORDER — PROMETHAZINE HCL 25 MG/ML IJ SOLN
6.2500 mg | INTRAMUSCULAR | Status: DC | PRN
  Administered 2011-10-27: 6.25 mg via INTRAVENOUS

## 2011-10-27 MED ORDER — ACETAMINOPHEN 325 MG PO TABS: 650.0000 mg | ORAL_TABLET | ORAL | Status: DC | PRN

## 2011-10-27 MED ORDER — LACTATED RINGERS IV SOLN
INTRAVENOUS | Status: DC
  Administered 2011-10-27: 14:00:00 via INTRAVENOUS
  Administered 2011-10-27: 1000 mL via INTRAVENOUS

## 2011-10-27 MED ORDER — SODIUM CHLORIDE 0.9 % IV SOLN
INTRAVENOUS | Status: AC
  Filled 2011-10-27: qty 3

## 2011-10-27 MED ORDER — GLYCOPYRROLATE 0.2 MG/ML IJ SOLN
INTRAMUSCULAR | Status: DC | PRN
  Administered 2011-10-27: .4 mg via INTRAVENOUS

## 2011-10-27 MED ORDER — FENTANYL CITRATE 0.05 MG/ML IJ SOLN
25.0000 ug | INTRAMUSCULAR | Status: DC | PRN
Start: 2011-10-27 — End: 2011-10-27

## 2011-10-27 MED ORDER — SODIUM CHLORIDE 0.9 % IJ SOLN: 3.0000 mL | Freq: Two times a day (BID) | INTRAMUSCULAR | Status: DC

## 2011-10-27 MED ORDER — SODIUM CHLORIDE 0.9 % IV SOLN: 250.0000 mL | INTRAVENOUS | Status: DC | PRN

## 2011-10-27 MED ORDER — FENTANYL CITRATE 0.05 MG/ML IJ SOLN
INTRAMUSCULAR | Status: AC
  Filled 2011-10-27: qty 2

## 2011-10-27 MED ORDER — LIDOCAINE HCL (CARDIAC) 20 MG/ML IV SOLN
INTRAVENOUS | Status: DC | PRN
  Administered 2011-10-27: 30 mg via INTRAVENOUS

## 2011-10-27 MED ORDER — KETOROLAC TROMETHAMINE 30 MG/ML IJ SOLN
INTRAMUSCULAR | Status: DC | PRN
  Administered 2011-10-27: 30 mg via INTRAVENOUS

## 2011-10-27 MED ORDER — ONDANSETRON HCL 4 MG/2ML IJ SOLN
INTRAMUSCULAR | Status: DC | PRN
  Administered 2011-10-27 (×2): 2 mg via INTRAVENOUS

## 2011-10-27 MED ORDER — BUPIVACAINE-EPINEPHRINE 0.25% -1:200000 IJ SOLN
INTRAMUSCULAR | Status: DC | PRN
  Administered 2011-10-27: 50 mL

## 2011-10-27 MED ORDER — ONDANSETRON HCL 4 MG/2ML IJ SOLN: 4.0000 mg | Freq: Four times a day (QID) | INTRAMUSCULAR | Status: DC | PRN

## 2011-10-27 MED ORDER — LACTATED RINGERS IR SOLN
Status: DC | PRN
  Administered 2011-10-27 (×2): 1000 mL
  Administered 2011-10-27: 3000 mL

## 2011-10-27 MED ORDER — PROMETHAZINE HCL 12.5 MG PO TABS: 12.5000 mg | ORAL_TABLET | Freq: Four times a day (QID) | ORAL | Status: AC | PRN

## 2011-10-27 MED ORDER — FENTANYL CITRATE 0.05 MG/ML IJ SOLN
INTRAMUSCULAR | Status: DC | PRN
  Administered 2011-10-27: 50 ug via INTRAVENOUS
  Administered 2011-10-27 (×4): 25 ug via INTRAVENOUS
  Administered 2011-10-27: 50 ug via INTRAVENOUS

## 2011-10-27 MED ORDER — OXYCODONE HCL 5 MG PO TABS
5.0000 mg | ORAL_TABLET | ORAL | Status: AC | PRN
Start: 2011-10-27 — End: 2011-11-06

## 2011-10-27 MED ORDER — PROPOFOL 10 MG/ML IV EMUL
INTRAVENOUS | Status: DC | PRN
  Administered 2011-10-27: 150 mg via INTRAVENOUS

## 2011-10-27 MED ORDER — PROMETHAZINE HCL 25 MG/ML IJ SOLN
INTRAMUSCULAR | Status: AC
  Filled 2011-10-27: qty 1

## 2011-10-27 MED ORDER — FENTANYL CITRATE 0.05 MG/ML IJ SOLN
25.0000 ug | INTRAMUSCULAR | Status: DC | PRN
  Administered 2011-10-27 (×2): 50 ug via INTRAVENOUS

## 2011-10-27 MED ORDER — FENTANYL CITRATE 0.05 MG/ML IJ SOLN
50.0000 ug | INTRAMUSCULAR | Status: DC | PRN
  Administered 2011-10-27: 100 ug via INTRAVENOUS

## 2011-10-27 MED ORDER — EPHEDRINE SULFATE 50 MG/ML IJ SOLN
INTRAMUSCULAR | Status: DC | PRN
  Administered 2011-10-27: 5 mg via INTRAVENOUS
  Administered 2011-10-27: 10 mg via INTRAVENOUS
  Administered 2011-10-27: 5 mg via INTRAVENOUS
  Administered 2011-10-27: 2.5 mg via INTRAVENOUS
  Administered 2011-10-27: 10 mg via INTRAVENOUS

## 2011-10-27 MED ORDER — AMOXICILLIN-POT CLAVULANATE 875-125 MG PO TABS: 1.0000 | ORAL_TABLET | Freq: Two times a day (BID) | ORAL | Status: AC

## 2011-10-27 MED ORDER — SUCCINYLCHOLINE CHLORIDE 20 MG/ML IJ SOLN
INTRAMUSCULAR | Status: DC | PRN
  Administered 2011-10-27: 100 mg via INTRAVENOUS

## 2011-10-27 MED ORDER — STERILE WATER FOR IRRIGATION IR SOLN
Status: DC | PRN
  Administered 2011-10-27: 1500 mL

## 2011-10-27 MED ORDER — IOHEXOL 300 MG/ML  SOLN
INTRAMUSCULAR | Status: DC | PRN
  Administered 2011-10-27: 10 mL via INTRAVENOUS

## 2011-10-27 MED ORDER — CISATRACURIUM BESYLATE 2 MG/ML IV SOLN
INTRAVENOUS | Status: DC | PRN
  Administered 2011-10-27 (×2): 1 mg via INTRAVENOUS
  Administered 2011-10-27: 6 mg via INTRAVENOUS
  Administered 2011-10-27: 2 mg via INTRAVENOUS

## 2011-10-27 MED ORDER — IOHEXOL 300 MG/ML  SOLN
INTRAMUSCULAR | Status: AC
  Filled 2011-10-27: qty 1

## 2011-10-27 MED ORDER — NEOSTIGMINE METHYLSULFATE 1 MG/ML IJ SOLN
INTRAMUSCULAR | Status: DC | PRN
  Administered 2011-10-27: 3 mg via INTRAVENOUS

## 2011-10-27 MED ORDER — LACTATED RINGERS IV SOLN
INTRAVENOUS | Status: DC | PRN
  Administered 2011-10-27 (×2): via INTRAVENOUS

## 2011-10-27 MED ORDER — SODIUM CHLORIDE 0.9 % IJ SOLN: 3.0000 mL | INTRAMUSCULAR | Status: DC | PRN

## 2011-10-27 MED ORDER — AMPICILLIN-SULBACTAM SODIUM 3 (2-1) G IJ SOLR
3.0000 g | INTRAMUSCULAR | Status: DC | PRN
  Administered 2011-10-27: 3 g via INTRAVENOUS

## 2011-10-27 MED ORDER — 0.9 % SODIUM CHLORIDE (POUR BTL) OPTIME
TOPICAL | Status: DC | PRN
  Administered 2011-10-27: 1000 mL

## 2011-10-27 MED ORDER — BUPIVACAINE-EPINEPHRINE 0.25% -1:200000 IJ SOLN
INTRAMUSCULAR | Status: AC
  Filled 2011-10-27: qty 1

## 2011-10-27 MED ORDER — PROMETHAZINE HCL 25 MG/ML IJ SOLN: 12.5000 mg | Freq: Four times a day (QID) | INTRAMUSCULAR | Status: DC | PRN

## 2011-10-27 SURGICAL SUPPLY — 50 items
APPLIER CLIP 5 13 M/L LIGAMAX5 (MISCELLANEOUS) ×2
APPLIER CLIP ROT 10 11.4 M/L (STAPLE)
APR CLP MED LRG 11.4X10 (STAPLE)
APR CLP MED LRG 5 ANG JAW (MISCELLANEOUS) ×1
BAG SPEC RTRVL LRG 6X4 10 (ENDOMECHANICALS) ×1
CABLE HIGH FREQUENCY MONO STRZ (ELECTRODE) ×1 IMPLANT
CANISTER SUCTION 2500CC (MISCELLANEOUS) ×2 IMPLANT
CLIP APPLIE 5 13 M/L LIGAMAX5 (MISCELLANEOUS) IMPLANT
CLIP APPLIE ROT 10 11.4 M/L (STAPLE) IMPLANT
CLOSURE STERI STRIP 1/2 X4 (GAUZE/BANDAGES/DRESSINGS) ×1 IMPLANT
CLOTH BEACON ORANGE TIMEOUT ST (SAFETY) ×2 IMPLANT
COVER MAYO STAND STRL (DRAPES) ×2 IMPLANT
COVER SURGICAL LIGHT HANDLE (MISCELLANEOUS) ×1 IMPLANT
DECANTER SPIKE VIAL GLASS SM (MISCELLANEOUS) ×2 IMPLANT
DRAPE C-ARM 42X72 X-RAY (DRAPES) ×2 IMPLANT
DRAPE LAPAROSCOPIC ABDOMINAL (DRAPES) ×2 IMPLANT
DRAPE WARM FLUID 44X44 (DRAPE) ×1 IMPLANT
DRSG TEGADERM 2-3/8X2-3/4 SM (GAUZE/BANDAGES/DRESSINGS) ×4 IMPLANT
DRSG TEGADERM 4X4.75 (GAUZE/BANDAGES/DRESSINGS) ×2 IMPLANT
ELECT REM PT RETURN 9FT ADLT (ELECTROSURGICAL) ×2
ELECTRODE REM PT RTRN 9FT ADLT (ELECTROSURGICAL) ×1 IMPLANT
ENDOLOOP SUT PDS II  0 18 (SUTURE) ×1
ENDOLOOP SUT PDS II 0 18 (SUTURE) IMPLANT
GAUZE SPONGE 2X2 8PLY STRL LF (GAUZE/BANDAGES/DRESSINGS) IMPLANT
GLOVE ECLIPSE 8.0 STRL XLNG CF (GLOVE) ×2 IMPLANT
GLOVE INDICATOR 8.0 STRL GRN (GLOVE) ×2 IMPLANT
GOWN STRL NON-REIN LRG LVL3 (GOWN DISPOSABLE) ×4 IMPLANT
GOWN STRL REIN XL XLG (GOWN DISPOSABLE) ×3 IMPLANT
KIT BASIN OR (CUSTOM PROCEDURE TRAY) ×2 IMPLANT
NS IRRIG 1000ML POUR BTL (IV SOLUTION) ×1 IMPLANT
POUCH SPECIMEN RETRIEVAL 10MM (ENDOMECHANICALS) ×1 IMPLANT
SCALPEL HARMONIC ACE (MISCELLANEOUS) ×2 IMPLANT
SCISSORS LAP 5X35 DISP (ENDOMECHANICALS) ×1 IMPLANT
SET CHOLANGIOGRAPH MIX (MISCELLANEOUS) ×1 IMPLANT
SET IRRIG TUBING LAPAROSCOPIC (IRRIGATION / IRRIGATOR) ×1 IMPLANT
SLEEVE Z-THREAD 5X100MM (TROCAR) IMPLANT
SPONGE GAUZE 2X2 STER 10/PKG (GAUZE/BANDAGES/DRESSINGS) ×1
SUT MNCRL AB 4-0 PS2 18 (SUTURE) ×2 IMPLANT
SUT VICRYL 0 ENDOLOOP (SUTURE) IMPLANT
SUT VICRYL 0 TIES 12 18 (SUTURE) IMPLANT
SUT VICRYL 0 UR6 27IN ABS (SUTURE) ×3 IMPLANT
SYR 20CC LL (SYRINGE) ×2 IMPLANT
TOWEL OR 17X26 10 PK STRL BLUE (TOWEL DISPOSABLE) ×2 IMPLANT
TRAY LAP CHOLE (CUSTOM PROCEDURE TRAY) ×2 IMPLANT
TROCAR 5M 150ML BLDLS (TROCAR) ×1 IMPLANT
TROCAR BLADELESS OPT 5 75 (ENDOMECHANICALS) ×1 IMPLANT
TROCAR CANNULA W/PORT DUAL 5MM (MISCELLANEOUS) IMPLANT
TROCAR Z-THREAD FIOS 11X100 BL (TROCAR) IMPLANT
TROCAR Z-THREAD FIOS 5X100MM (TROCAR) ×1 IMPLANT
TUBING INSUFFLATION 10FT LAP (TUBING) ×2 IMPLANT

## 2011-10-27 NOTE — Progress Notes (Signed)
Interpretor at bedside. 

## 2011-10-27 NOTE — Brief Op Note (Signed)
10/27/2011  12:01 PM  PATIENT:  Susan Lopez  50 y.o. female  Patient Care Team: Collene Leyden as PCP - General (Family Medicine)    PRE-OPERATIVE DIAGNOSIS:  Chronic cholecystitis with large gallstone  POST-OPERATIVE DIAGNOSIS:  Acute on chronic cholecystitis with large gallstone  PROCEDURE:  Procedure(s): LAPAROSCOPIC CHOLECYSTECTOMY WITH INTRAOPERATIVE CHOLANGIOGRAM (single site)  SURGEON:  Surgeon(s): Ardeth Sportsman, MD  PHYSICIAN ASSISTANT:   ASSISTANTS: none   ANESTHESIA:   local and general  EBL:  Total I/O In: 1100 [I.V.:1000; IV Piggyback:100] Out: -   BLOOD ADMINISTERED:none  DRAINS: none   LOCAL MEDICATIONS USED:  BUPIVICAINE 50  SPECIMEN:  Source of Specimen:  Gallbladder  DISPOSITION OF SPECIMEN:  PATHOLOGY  COUNTS:  YES  TOURNIQUET:  * No tourniquets in log *  DICTATION: .Other Dictation: Dictation Number [id number]  PLAN OF CARE: Discharge to home after PACU  PATIENT DISPOSITION:  PACU - hemodynamically stable.   Delay start of Pharmacological VTE agent (>24hrs) due to surgical blood loss or risk of bleeding:  NO

## 2011-10-27 NOTE — Progress Notes (Signed)
Received in short stay from PACU very sleepy from Phenergan but responds easily. Incision clean and dry ice bag to incision.

## 2011-10-27 NOTE — Progress Notes (Signed)
Prescriptions called into pharmacy in [place], [place] by doctor. Called Dr Michaell Cowing to inform & received orders to call antibiotic & phenergan into [facility] at [address], [place].

## 2011-10-27 NOTE — H&P (Signed)
HPI  Patient Care Team:  Collene Leyden as PCP - General (Family Medicine)  This patient is a 50 y.o.female who presents today for surgical evaluation.  Reason for visit: Gallstones and biliary colic.   Patient is a 50 year old female who has numerous abdominal issues. She has history of reflux disease it seems to be well-controlled with omeprazole and Carafate. She also history of rectal bleeding. She's never had a colonoscopy. She struggles with chronic constipation. She occasionally will use laxatives to help her have bowel movements. She's never been on a bowel regimen.  Her main complaint has been intermittent right upper cautery postprandial abdominal pain. She often will get bloating. Seems to happen with more heavy foods but it's hard to tease out what diet bothers her more than not. She will get discomfort when she bent over well. This pain is different than heartburn or reflux. No sick contacts or travel history.   She does not speak [place]. She comes in with her niece who is bilingual. They prefer the niece to be interpreter.   Past Medical History   Diagnosis  Date   .  Iron deficiency anemia    .  Hypertension    .  Arthritis    .  Gallstones     Past Surgical History   Procedure  Date   .  Cesarean section  1985, 1992    History    Social History   .  Marital Status:  Single     Spouse Name:  N/A     Number of Children:  N/A   .  Years of Education:  N/A    Occupational History   .  Not on file.    Social History Main Topics   .  Smoking status:  Former Games developer   .  Smokeless tobacco:  Not on file   .  Alcohol Use:  No   .  Drug Use:  No   .  Sexually Active:     Other Topics  Concern   .  Not on file    Social History Narrative   .  No narrative on file    History reviewed. No pertinent family history.  Current outpatient prescriptions:Bisacodyl (LAXATIVE PO), Take 5 mg by mouth daily. , Disp: , Rfl: ; lisinopril (PRINIVIL,ZESTRIL) 10 MG tablet, Take 10  mg by mouth daily. , Disp: , Rfl: ; omeprazole (PRILOSEC) 20 MG capsule, Take 20 mg by mouth daily. , Disp: , Rfl:  No Known Allergies  Review of Systems  Constitutional: Negative for fever, chills, diaphoresis, appetite change and fatigue.  HENT: Positive for sore throat and trouble swallowing. Negative for ear pain, neck pain and ear discharge.  Eyes: Negative for photophobia, discharge and visual disturbance.  Respiratory: Negative for cough, choking, chest tightness, shortness of breath, wheezing and stridor.  Cardiovascular: Positive for chest pain. Negative for palpitations and leg swelling.  Patient walks 30 minutes for about without difficulty. 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 No exertional chest/neck/shoulder/arm pain.  Gastrointestinal: Positive for nausea, abdominal pain, constipation, abdominal distention, anal bleeding and rectal pain. Negative for vomiting and diarrhea.  Genitourinary: Negative for dysuria, urgency, frequency, decreased urine volume, vaginal bleeding, vaginal discharge and difficulty urinating.  No personal nor family history of GI/colon cancer, inflammatory bowel disease, irritable bowel syndrome, allergy such as Celiac Sprue, dietary/dairy problems, colitis, ulcers nor gastritis.  No recent sick contacts/gastroenteritis. No travel outside the country. No changes in diet.  Musculoskeletal: Positive for arthralgias. Negative  for myalgias and gait problem.  Skin: Negative for color change, pallor and rash.  Neurological: Negative for dizziness, speech difficulty, weakness and numbness.  Hematological: Negative for adenopathy.  Psychiatric/Behavioral: Negative for confusion and agitation. The patient is not nervous/anxious.    Objective:   Physical Exam  Constitutional: She is oriented to person, place, and time. She appears well-developed and well-nourished. No distress.  HENT:  Head: Normocephalic.  Mouth/Throat: Oropharynx is clear and moist. No oropharyngeal  exudate.  Eyes: Conjunctivae and EOM are normal. Pupils are equal, round, and reactive to light. No scleral icterus.  Neck: Normal range of motion. Neck supple. No tracheal deviation present.  Cardiovascular: Normal rate, regular rhythm and intact distal pulses.  Pulmonary/Chest: Effort normal and breath sounds normal. No respiratory distress. She exhibits no tenderness.  Abdominal: Soft. She exhibits no distension and no mass. There is no rebound and no guarding. Hernia confirmed negative in the right inguinal area and confirmed negative in the left inguinal area.  Obese.  RUQ TTP, no major Murphy's sign   Musculoskeletal: Normal range of motion. She exhibits no tenderness.  Lymphadenopathy:  She has no cervical adenopathy.  Right: No inguinal adenopathy present.  Left: No inguinal adenopathy present.  Neurological: She is alert and oriented to person, place, and time. No cranial nerve deficit. She exhibits normal muscle tone. Coordination normal.  Skin: Skin is warm and dry. No rash noted. She is not diaphoretic. No erythema.  Psychiatric: She has a normal mood and affect. Her behavior is normal. Judgment and thought content normal.   Studies: A 2 cm large gallstone on ultrasound. Questionable Murphy's sign. No gallbladder wall thickening or ductal dilatation   Assessment:    Chronic cholecystitis + gallstone  GERD - controlled on meds. No Hiatal hernia on 2009 UGI  Anorectal bleeding, most likely due to hemorrhoids   Plan:    Continue PPIs and Carafate for GERD. Consider reevaluation after cholecystectomy.   Consider laparoscopic cholecystectomy. I think she is reasonable to do a single site technique. She may need conversion to multiple port. I did discuss technique:  The anatomy & physiology of hepatobiliary & pancreatic function was discussed. The pathophysiology of gallbladder dysfunction was discussed. Natural history risks without surgery was discussed. I feel the risks of no  intervention will lead to serious problems that outweigh the operative risks; therefore, I recommended cholecystectomy to remove the pathology. I explained laparoscopic techniques with possible need for an open approach. Probable cholangiogram to evaluate the bilary tract was explained as well.   Risks such as bleeding, infection, abscess, leak, injury to other organs, need for further treatment, heart attack, death, and other risks were discussed. I noted a good likelihood this will help address the problem. Possibility that this will not correct all abdominal symptoms was explained. Goals of post-operative recovery were discussed as well. We will work to minimize complications. An educational handout further explaining the pathology and treatment options was given as well. Questions were answered. The patient expresses understanding & wishes to proceed with surgery.  I have re-reviewed the the patient's records, history, medications, and allergies.  I have re-examined the patient.  I again discussed intraoperative plans and goals of post-operative recovery.  The patient agrees to proceed.

## 2011-10-27 NOTE — Transfer of Care (Signed)
Immediate Anesthesia Transfer of Care Note  Patient: Susan Lopez  Procedure(s) Performed:  LAPAROSCOPIC CHOLECYSTECTOMY WITH INTRAOPERATIVE CHOLANGIOGRAM - Laparoscopic Chole w/ IOC Single Site  Patient Location: PACU  Anesthesia Type: General  Level of Consciousness: sedated  Airway & Oxygen Therapy: Patient Spontanous Breathing  Post-op Assessment: Report given to PACU RN and Post -op Vital signs reviewed and stable  Post vital signs: Reviewed and stable  Complications: No apparent anesthesia complications

## 2011-10-27 NOTE — Anesthesia Postprocedure Evaluation (Signed)
  Anesthesia Post-op Note  Patient: Susan Lopez  Procedure(s) Performed:  LAPAROSCOPIC CHOLECYSTECTOMY WITH INTRAOPERATIVE CHOLANGIOGRAM - Laparoscopic Chole w/ IOC Single Site  Patient Location: PACU  Anesthesia Type: General  Level of Consciousness: oriented and sedated  Airway and Oxygen Therapy: Patient Spontanous Breathing  Post-op Pain: mild  Post-op Assessment: Post-op Vital signs reviewed, Patient's Cardiovascular Status Stable, Respiratory Function Stable and Patent Airway  Post-op Vital Signs: stable  Complications: No apparent anesthesia complications

## 2011-10-27 NOTE — Anesthesia Preprocedure Evaluation (Signed)
Anesthesia Evaluation  Patient identified by MRN, date of birth, ID band Patient awake    Reviewed: Allergy & Precautions, H&P , NPO status , Patient's Chart, lab work & pertinent test results, reviewed documented beta blocker date and time   History of Anesthesia Complications (+) PONV  Airway Mallampati: II TM Distance: >3 FB Neck ROM: Full    Dental  (+) Dental Advisory Given   Pulmonary neg pulmonary ROS,  clear to auscultation        Cardiovascular hypertension, Pt. on medications Regular Normal Denies cardiac symptoms   Neuro/Psych Negative Neurological ROS  Negative Psych ROS   GI/Hepatic Neg liver ROS, Gallstones, Cholecystitis   Endo/Other  Negative Endocrine ROS  Renal/GU negative Renal ROS  Genitourinary negative   Musculoskeletal negative musculoskeletal ROS (+)   Abdominal   Peds negative pediatric ROS (+)  Hematology negative hematology ROS (+)   Anesthesia Other Findings Upper front teeth implants  Reproductive/Obstetrics negative OB ROS                           Anesthesia Physical Anesthesia Plan  ASA: II  Anesthesia Plan: General   Post-op Pain Management:    Induction: Intravenous  Airway Management Planned: Oral ETT  Additional Equipment:   Intra-op Plan:   Post-operative Plan: Extubation in OR  Informed Consent:   Dental advisory given  Plan Discussed with: CRNA and Surgeon  Anesthesia Plan Comments:         Anesthesia Quick Evaluation

## 2011-10-28 ENCOUNTER — Encounter (HOSPITAL_COMMUNITY): Payer: Self-pay | Admitting: Surgery

## 2011-10-28 NOTE — Op Note (Signed)
Susan Lopez, Susan Lopez      ACCOUNT NO.:  [id number]  MEDICAL RECORD NO.:  [id number]  LOCATION:  [facility]                         FACILITY:  [facility]  PHYSICIAN:  Ardeth Sportsman, MD     DATE OF BIRTH:  [birth year]  DATE OF PROCEDURE: DATE OF DISCHARGE:  10/27/2011                              OPERATIVE REPORT   PRIMARY CARE PHYSICIAN:  Dr. Boneta Lucks  SURGEON:  Ardeth Sportsman, MD  PREOPERATIVE DIAGNOSIS:  Chronic cholecystitis with cholecystolithiasis.  POSTOPERATIVE DIAGNOSES: 1. Acute on chronic cholecystitis with cholecystolithiasis and empyema     of the gallbladder. 2. Steatohepatosis.  PROCEDURE PERFORMED:  Laparoscopic cholecystectomy with intraoperative cholangiogram (single site technique).  SPECIMEN:  Gallbladder.  DRAINS:  None.  ESTIMATED BLOOD LOSS:  50 mL.  COMPLICATIONS:  None major.  INDICATIONS:  [name] is a 50 year old morbidly obese Hispanic female with abdominal pain and episodes of biliary colic suspicious for chronic cholecystitis.  She also has heartburn, reflux, constipation, numerous upper abdominal complaints.  The anatomy and physiology of hepatobiliary and pancreatic function was discussed.  Pathophysiology of cholecystitis with its natural history, risks were discussed.  Recommendation was made for cholecystectomy. Single site versus multiple ports versus open techniques were discussed. Risks, benefits, and alternatives were discussed.  Possibility this does not address all her abdominal and other health complaints was discussed as well.  I have discussed with the family in the office, who acted as interpreter and then with an official interpreter in the holding area as well.  Questions were answered and the patient agreed to proceed.  OPERATIVE FINDINGS:  The patient had a thickened gallbladder wall with tan creamy empyema of the gallbladder suspicious for acute on chronic cholecystitis.  Her cholangiogram showed a mildly  dilated biliary system and a low insertion of her cystic duct on the common bile duct, but otherwise normal anatomy.  She also had fatty change in the liver.  DESCRIPTION OF PROCEDURE:  Informed consent was confirmed.  The patient underwent general anesthesia without any difficulty.  She was positioned supine with arms tucked.  She had sequential compression devices active during the entire case.  Her abdomen was prepped and draped in a sterile fashion.  Surgical time-out confirmed our plan.  I made a curvilinear incision within the superior umbilical fold.  I did a cutdown and placed a #5 mm long port through the supraumbilical fascia using a modified Hasson technique.  I induced carbon dioxide insufflation.  Camera inspection revealed no injury.  There were no major adhesions to the anterior abdominal wall supraumbilically.  I went ahead and placed a 5 mm port in the left upper aspect of the wound and a 5 mm grasper in the right inferior aspect of the wound.  I turned attention toward the right upper quadrant.  She had a large amount of omental and mesenteric fat.  With repositioning, I could see the dome of the gallbladder and elevated it cephalad.  There were no major adhesions to the gallbladder, but the gallbladder was very thickened and it was difficult to grasp.  In attempting to grasp it, I did note a tear and tan creamy fluid evacuated out consistent with empyema of the gallbladder.  I aspirated that  until the gallbladder was better decompressed.  The gallbladder wall was still very thickened.  I elevated the gallbladder cephalad.  I freed the peritoneal attachments on the gallbladder to the liver bed on the posterolateral wall and then came around anteriorly as well on the anterior lymphatics.  I came around the anterior medial wall to help mobilize the gallbladder off the liver bed.  In doing this, the infundibulum on the cystic duct was somewhat folded upon itself.   In straightening it out, I did note a tear in the cystic duct just below the infundibulum which was severely stretched out and dilated from an impacted gallbladder stone.  I found the cystic artery, isolated that, and ligated using ultrasonic dissection.  I did further dissection to get a good critical view of the cystic duct by freeing off the proximal half of the gallbladder off the liver bed.  I did put a #5-French cholangiogram catheter through a puncture site in the subcostal ridge into the cystic duct.  I ran a cholangiogram using dilute radiopaque contrast and continuous fluoroscopy.  Contrast flowed cystic duct down into the common bile duct.  The common hepatic duct was mildly dilated, but it refluxed into the right and left intrahepatic chains and flowed easily across the ampulla into the duodenum consistent with a normal cholangiogram.  I removed the cholangiogram catheter.  I placed 4 clips on the cystic duct and completed cystic duct transection.  I freed the gallbladder from its main attachments on the liver bed.  The posterior wall of the gallbladder and the gallbladder fossa was rather friable and so there was some oozing at certain points that I controlled with spatula tip cautery.  I placed the gallbladder in an EndoCatch bag and removed it out through the supraumbilical fascia.  I did have to open it up to 2.5 cm to get it out as there was a giant stone and very thickened gallbladder wall within it.  I reapproximated the fascia using interrupted Vicryl stitches and tied those down.  I replaced the ports.  I did camera inspection.  I did copious irrigation of over 5 L of saline in the bilateral upper quadrants to make sure I saw no evidence of stones or any pus or empyema.  I ensured hemostasis on the liver bed and on the remaining liver.  Hemostasis was good on the omentum.  I evacuated ports and the carbon dioxide.  I finished closing the fascia. I closed the skin  using 4-0 Monocryl stitch.  Sterile dressing was applied.  The patient was extubated and taken to the recovery room in stable condition.  I discussed postop care with the patient in detail. I am about to discuss it with the family again.  Instructions are written.     Ardeth Sportsman, MD     SCG/MEDQ  D:  10/27/2011  T:  10/28/2011  Job:  [id number]

## 2011-11-04 ENCOUNTER — Telehealth (INDEPENDENT_AMBULATORY_CARE_PROVIDER_SITE_OTHER): Payer: Self-pay | Admitting: General Surgery

## 2011-11-04 ENCOUNTER — Encounter (HOSPITAL_COMMUNITY): Payer: Self-pay

## 2011-11-04 ENCOUNTER — Emergency Department (INDEPENDENT_AMBULATORY_CARE_PROVIDER_SITE_OTHER)
Admission: EM | Admit: 2011-11-04 | Discharge: 2011-11-04 | Disposition: A | Discharge status: Home / Self Care | Payer: Self-pay | Source: Home / Self Care | Attending: Family Medicine | Admitting: Family Medicine

## 2011-11-04 DIAGNOSIS — R51 Headache: Secondary | ICD-10-CM

## 2011-11-04 MED ORDER — TRAMADOL HCL 50 MG PO TABS: 50.0000 mg | ORAL_TABLET | Freq: Three times a day (TID) | ORAL | Status: AC | PRN

## 2011-11-04 NOTE — ED Provider Notes (Signed)
History     CSN: [id number]  Arrival date & time 11/04/11  1405   First MD Initiated Contact with Patient 11/04/11 1618      Chief Complaint  Patient presents with  . Headache    (Consider location/radiation/quality/duration/timing/severity/associated sxs/prior treatment) HPI Comments: (interview and examination performed with assistance of [employer]) Kemesha presents for evaluation of chronic headaches after a recent exacerbation. She states that she is followed at [facility] who sent her here for "studies." She reports a long hx of headaches, and has been taking tramadol in the past with modest relief. She denies any vision changes, no nausea, no vomiting, no numbness, tingling, or weakness.  Patient is a 51 y.o. female presenting with headaches. The history is provided by the patient.  Headache The primary symptoms include headaches. Primary symptoms do not include dizziness, visual change, nausea or vomiting. The symptoms began more than 1 week ago. The symptoms are unchanged.  The headache began more than 2 days ago. Headache is a chronic problem. The headache is present intermittently. Location/region(s) of the headache: frontal. The headache is associated with nothing. The headache is not associated with visual change or weakness.  Additional symptoms do not include weakness.    Past Medical History  Diagnosis Date  . Iron deficiency anemia   . Hypertension   . Arthritis   . Gallstones   . Hemorrhoids, external 10-21-11    occ. bothersome  . Calculus of gallbladder with acute and chronic cholecystitis without obstruction, s/p lap chole [id number] 09/19/2011    Past Surgical History  Procedure Date  . Cesarean section 1985, 1992  . Eye surgery 1`12-21-10    bil. for tissue growth-laser surgery  . Cholecystectomy 10/27/2011    Procedure: LAPAROSCOPIC CHOLECYSTECTOMY WITH INTRAOPERATIVE CHOLANGIOGRAM;  Surgeon: Ardeth Sportsman, MD;  Location: [facility];  Service:  General;  Laterality: N/A;  Laparoscopic Chole w/ IOC Single Site    History reviewed. No pertinent family history.  History  Substance Use Topics  . Smoking status: Former Smoker    Quit date: 10/20/2004  . Smokeless tobacco: Not on file  . Alcohol Use: No    OB History    Grav Para Term Preterm Abortions TAB SAB Ect Mult Living                  Review of Systems  Constitutional: Negative.   Eyes: Negative.   Respiratory: Negative.   Cardiovascular: Negative.   Gastrointestinal: Negative.  Negative for nausea and vomiting.  Genitourinary: Negative.   Musculoskeletal: Negative.   Skin: Negative.   Neurological: Positive for headaches. Negative for dizziness, syncope, weakness and numbness.    Allergies  Hydrocodone and Oxycodone  Home Medications   Current Outpatient Rx  Name Route Sig Dispense Refill  . ACETAMINOPHEN 325 MG PO TABS Oral Take 325 mg by mouth every 6 (six) hours as needed. PAIN      . LAXATIVE PO Oral Take 5 mg by mouth 3 (three) times daily as needed. CONSTIPATION     . BLACK COHOSH PO Oral Take 1 tablet by mouth daily.      Marland Kitchen CALCIUM + D PO Oral Take 1 tablet by mouth 2 (two) times daily.      . OMEGA-3 FATTY ACIDS 1000 MG PO CAPS Oral Take 2 g by mouth daily.      Marland Kitchen SIMILASAN EARACHE RELIEF OT Otic Place 2 drops in ear(s) at bedtime.      Marland Kitchen LISINOPRIL 10 MG PO TABS  Oral Take 10 mg by mouth at bedtime.     Marland Kitchen MENTHOL (TOPICAL ANALGESIC) 5 % EX PADS Apply externally Apply 1 patch topically daily as needed. PAIN      . METHYLCELLULOSE 1 % OP SOLN Both Eyes Place 2 drops into both eyes 2 (two) times daily.      Carma Leaven M PLUS PO TABS Oral Take 1 tablet by mouth daily.      Marland Kitchen OMEPRAZOLE 20 MG PO CPDR Oral Take 20 mg by mouth daily.        BP 109/72  Pulse 74  Temp(Src) 98.5 F (36.9 C) (Oral)  Resp 20  SpO2 100%  LMP 08/20/2010  Physical Exam  Nursing note and vitals reviewed. Constitutional: She is oriented to person, place, and time. She  appears well-developed and well-nourished.  HENT:  Head: Normocephalic and atraumatic.  Right Ear: Tympanic membrane normal.  Left Ear: Tympanic membrane normal.  Mouth/Throat: Uvula is midline, oropharynx is clear and moist and mucous membranes are normal.  Eyes: EOM are normal.  Fundoscopic exam:      The right eye shows no arteriolar narrowing, no AV nicking, no hemorrhage and no papilledema.       The left eye shows no arteriolar narrowing, no AV nicking, no hemorrhage and no papilledema.  Neck: Normal range of motion.  Cardiovascular: Normal rate and regular rhythm.   Pulmonary/Chest: Effort normal.  Musculoskeletal: Normal range of motion.  Neurological: She is alert and oriented to person, place, and time.  Skin: Skin is warm and dry.  Psychiatric: Her behavior is normal.    ED Course  Procedures (including critical care time)  Labs Reviewed - No data to display No results found.   1. Headache       MDM  Refilled tramadol, [facility]       Richardo Priest, MD 11/26/11 1032

## 2011-11-04 NOTE — Telephone Encounter (Signed)
The patient contacted the office-Susan Lopez translated for me. Patient is c/o a rash on her face and arms which started 2 days ago,  Pt denies SOB, and N/V. She is s/p lap chole and was prescribed amoxicillin and oxycodone upon discharge. The patient is told to DC these current meds and I will call back with further instructions after speaking with Dr Michaell Cowing.Per Dr Michaell Cowing patient is to DC Amoxicillin and oxycodone and try to use aleve for pain. Susan notified the patient.

## 2011-11-04 NOTE — ED Notes (Signed)
Headache; eval by MD via [employer]

## 2011-11-14 ENCOUNTER — Encounter (INDEPENDENT_AMBULATORY_CARE_PROVIDER_SITE_OTHER): Payer: Self-pay | Admitting: Surgery

## 2011-11-14 ENCOUNTER — Ambulatory Visit (INDEPENDENT_AMBULATORY_CARE_PROVIDER_SITE_OTHER): Payer: PRIVATE HEALTH INSURANCE | Admitting: Surgery

## 2011-11-14 VITALS — BP 124/80 | HR 68 | Temp 97.6°F | Resp 16 | Ht 60.0 in | Wt 166.4 lb

## 2011-11-14 DIAGNOSIS — K8010 Calculus of gallbladder with chronic cholecystitis without obstruction: Secondary | ICD-10-CM

## 2011-11-14 DIAGNOSIS — M549 Dorsalgia, unspecified: Secondary | ICD-10-CM | POA: Insufficient documentation

## 2011-11-14 DIAGNOSIS — E669 Obesity, unspecified: Secondary | ICD-10-CM

## 2011-11-14 DIAGNOSIS — K8012 Calculus of gallbladder with acute and chronic cholecystitis without obstruction: Secondary | ICD-10-CM

## 2011-11-14 NOTE — Progress Notes (Signed)
Subjective:     Patient ID: Susan Lopez, female   DOB: 05-24-1961, 51 y.o.   MRN: [id number]  HPI  Susan Lopez  11/07/1960 [id number]  Patient Care Team: Collene Leyden as PCP - General (Family Medicine)  This patient is a 51 y.o.female who presents today for surgical evaluation.   Procedure: Cholecystectomy and cholangiogram 10/27/2011.  Pathology chronic cholecystitis.  The patient comes in today feeling that her but not normal. We had any interpreter for her in the room since she speaks no [place]. She had numerous questions or concerns. I spent over 20 minutes with her. She notes she still has some back pain. That is getting better. She's eating okay. She did get a rash with the oxycodone. We switched that around. She wondered if she got rash after eating some beef. However she ate beef after that and had no problems. No fevers chills or sweats. She is wanting to get back to work. However, she does not know when it is safe. No heartburn or reflux  No bad diarrhea. No more attacks. Abdominal pains nearly fading away.  Patient Active Problem List  Diagnoses  . ANEMIA-IRON DEFICIENCY  . CHEST PAIN, ATYPICAL  . GERD (gastroesophageal reflux disease)  . Calculus of gallbladder with acute and chronic cholecystitis without obstruction, s/p lap chole 27Dec2012  . Hemorrhoids, internal, with bleeding  . Back pain  . Obesity (BMI 30-39.9)    Past Medical History  Diagnosis Date  . Iron deficiency anemia   . Hypertension   . Arthritis   . Gallstones   . Hemorrhoids, external 10-21-11    occ. bothersome  . Calculus of gallbladder with acute and chronic cholecystitis without obstruction, s/p lap chole 27Dec2012 09/19/2011    Past Surgical History  Procedure Date  . Cesarean section 1985, 1992  . Eye surgery 1`12-21-10    bil. for tissue growth-laser surgery  . Cholecystectomy 10/27/2011    Procedure: LAPAROSCOPIC CHOLECYSTECTOMY WITH INTRAOPERATIVE  CHOLANGIOGRAM;  Surgeon: Ardeth Sportsman, MD;  Location: [facility];  Service: General;  Laterality: N/A;  Laparoscopic Chole w/ IOC Single Site    History   Social History  . Marital Status: Single    Spouse Name: N/A    Number of Children: N/A  . Years of Education: N/A   Occupational History  . Not on file.   Social History Main Topics  . Smoking status: Former Smoker    Quit date: 10/20/2004  . Smokeless tobacco: Not on file  . Alcohol Use: No  . Drug Use: No  . Sexually Active: Yes   Other Topics Concern  . Not on file   Social History Narrative  . No narrative on file    History reviewed. No pertinent family history.  Current outpatient prescriptions:acetaminophen (TYLENOL) 325 MG tablet, Take 325 mg by mouth every 6 (six) hours as needed. PAIN  , Disp: , Rfl: ;  Bisacodyl (LAXATIVE PO), Take 5 mg by mouth 3 (three) times daily as needed. CONSTIPATION , Disp: , Rfl: ;  BLACK COHOSH PO, Take 1 tablet by mouth daily.  , Disp: , Rfl: ;  Calcium Carbonate-Vitamin D (CALCIUM + D PO), Take 1 tablet by mouth 2 (two) times daily.  , Disp: , Rfl:  fish oil-omega-3 fatty acids 1000 MG capsule, Take 2 g by mouth daily.  , Disp: , Rfl: ;  Homeopathic Products (SIMILASAN EARACHE RELIEF OT), Place 2 drops in ear(s) at bedtime.  , Disp: , Rfl: ;  lisinopril (PRINIVIL,ZESTRIL)  10 MG tablet, Take 10 mg by mouth at bedtime. , Disp: , Rfl: ;  Menthol, Topical Analgesic, (ICY HOT) 5 % PADS, Apply 1 patch topically daily as needed. PAIN  , Disp: , Rfl:  methylcellulose (ARTIFICIAL TEARS) 1 % ophthalmic solution, Place 2 drops into both eyes 2 (two) times daily.  , Disp: , Rfl: ;  Multiple Vitamins-Minerals (MULTIVITAMINS THER. W/MINERALS) TABS, Take 1 tablet by mouth daily.  , Disp: , Rfl: ;  omeprazole (PRILOSEC) 20 MG capsule, Take 20 mg by mouth daily.  , Disp: , Rfl:  traMADol (ULTRAM) 50 MG tablet, Take 1 tablet (50 mg total) by mouth every 8 (eight) hours as needed for pain. Maximum dose= 8  tablets per day, Disp: 60 tablet, Rfl: 0  Allergies  Allergen Reactions  . Hydrocodone Nausea And Vomiting  . Oxycodone Rash    BP 124/80  Pulse 68  Temp(Src) 97.6 F (36.4 C) (Temporal)  Resp 16  Ht 5' (1.524 m)  Wt 166 lb 6.4 oz (75.479 kg)  BMI 32.50 kg/m2  LMP 08/20/2010     Review of Systems  Constitutional: Negative for fever, chills and diaphoresis.  HENT: Negative for ear pain, sore throat and trouble swallowing.   Eyes: Negative for photophobia and visual disturbance.  Respiratory: Negative for cough and choking.   Cardiovascular: Negative for chest pain and palpitations.  Gastrointestinal: Negative for nausea, vomiting, diarrhea, anal bleeding and rectal pain.  Genitourinary: Negative for dysuria, frequency and difficulty urinating.  Musculoskeletal: Positive for back pain. Negative for arthralgias and gait problem.  Skin: Negative for color change, pallor and rash.  Neurological: Negative for dizziness, speech difficulty, weakness and numbness.  Hematological: Negative for adenopathy.  Psychiatric/Behavioral: Negative for confusion and agitation. The patient is not nervous/anxious.        Objective:   Physical Exam  Constitutional: She is oriented to person, place, and time. She appears well-developed and well-nourished. No distress.  HENT:  Head: Normocephalic.  Mouth/Throat: Oropharynx is clear and moist. No oropharyngeal exudate.  Eyes: Conjunctivae and EOM are normal. Pupils are equal, round, and reactive to light. No scleral icterus.  Neck: Normal range of motion. No tracheal deviation present.  Cardiovascular: Normal rate and intact distal pulses.   Pulmonary/Chest: Effort normal. No respiratory distress. She exhibits no tenderness.  Abdominal: Soft. She exhibits no distension and no mass. There is no guarding. Hernia confirmed negative in the right inguinal area and confirmed negative in the left inguinal area.       Obese.  Incisions clean with  normal healing ridges.  No hernias  Genitourinary: No vaginal discharge found.  Musculoskeletal: Normal range of motion. She exhibits no tenderness.  Lymphadenopathy:       Right: No inguinal adenopathy present.       Left: No inguinal adenopathy present.  Neurological: She is alert and oriented to person, place, and time. No cranial nerve deficit. She exhibits normal muscle tone. Coordination normal.  Skin: Skin is warm and dry. No rash noted. She is not diaphoretic.  Psychiatric: She has a normal mood and affect. Her behavior is normal.       Assessment:     Obese female s/p lap chole for acute on chronic cholecystitis, improving    Plan:     Increase activity as tolerated.  Do not push through pain.  Okay to return to work next week. She is off all narcotics.  Advanced on diet as tolerated. Bowel regimen to avoid problems.  Return  to clinic p.r.n. The patient expressed understanding and appreciation

## 2011-11-14 NOTE — Patient Instructions (Signed)
Dolor de espalda, adultos  (Back Pain, Adult)  El dolor de espalda es frecuente. Con frecuencia mejora luego de algn tiempo. La causa suele no ser un peligro para la vida. La mayora de las personas aprende a controlarlo por sus propios medios.  CUIDADOS EN EL HOGAR   Mantngase fsicamente activo. Si puede, comience a dar cortas caminatas en un suelo plano. Trate de caminar un poco ms cada da.   Nopermanezca sentado, de pie ni conduzca automviles durante ms de 30 minutos seguidos. Nose quede en la cama.   Noevite los ejercicios ni el trabajo. La actividad puede ayudar a que la espalda se cure ms rpido.   Tenga cuidado al inclinarse o al levantar un objeto. Doble las rodillas, mantenga el [place] cerca de su cuerpo y no gire.   Duerma sobre un [employer] Acustese sobre un lado y doble las rodillas. Si se [employer], coloque una almohada debajo de las rodillas.   Tome la medicacin slo como le haya indicado el mdico.   Aplique hielo sobre la zona lesionada.   Ponga el hielo en una bolsa plstica.   Colquese una toalla entre la piel y la bolsa de hielo.   Deje la bolsa de hielo durante 15 a 3 a 4 veces por da, durante los primeros 2 o 3 das. Luego puede ir alternando entre hielo y compresas calientes.   Consulte a su mdico sobre cul ejercicios o IT sales professional.   Evite sentirse ansioso o estresado. Encuentre la forma de enfrentar el estrs, como por Lexicographer ejercicios.  SOLICITE AYUDA DE INMEDIATO SI:   El dolor no desaparece aunque haga reposo o tome medicamentos para Chief Technology Officer.   El dolor no desaparece en una semana.   Tiene nuevos problemas.   No se siente mejor.   El dolor se extiende a las piernas.   No puede controlar la orina o la materia fecal.   Siente que los brazos estn dbiles o pierde la sensibilidad (estn adormecidos).   Tiene Programme researcher, broadcasting/film/video (nuseas) y vmitos.   Siente dolor abdominal.   Siente  que se desvanece (se desmaya).  ASEGRESE DE QUE:   Comprende estas instrucciones.   Controlar su enfermedad.   Solicitar ayuda de inmediato si no mejora o si empeora.  Document Released: 05/02/2011 Document Revised: 06/29/2011 [facility] Patient Information 2012 [place], [place].

## 2011-11-16 ENCOUNTER — Telehealth (HOSPITAL_COMMUNITY): Payer: Self-pay | Admitting: *Deleted

## 2011-11-16 ENCOUNTER — Other Ambulatory Visit (HOSPITAL_COMMUNITY): Payer: Self-pay | Admitting: Family Medicine

## 2011-11-16 DIAGNOSIS — R200 Anesthesia of skin: Secondary | ICD-10-CM

## 2011-11-21 ENCOUNTER — Ambulatory Visit (HOSPITAL_COMMUNITY)
Admission: RE | Admit: 2011-11-21 | Discharge: 2011-11-21 | Disposition: A | Discharge status: Home / Self Care | Payer: Self-pay | Source: Ambulatory Visit | Attending: Family Medicine | Admitting: Family Medicine

## 2011-11-21 DIAGNOSIS — R51 Headache: Secondary | ICD-10-CM | POA: Insufficient documentation

## 2011-11-21 DIAGNOSIS — R209 Unspecified disturbances of skin sensation: Secondary | ICD-10-CM | POA: Insufficient documentation

## 2011-11-21 DIAGNOSIS — R200 Anesthesia of skin: Secondary | ICD-10-CM

## 2012-01-26 ENCOUNTER — Ambulatory Visit (HOSPITAL_COMMUNITY)
Admission: RE | Admit: 2012-01-26 | Discharge: 2012-01-26 | Disposition: A | Discharge status: Home / Self Care | Payer: Self-pay | Source: Ambulatory Visit | Attending: Family Medicine | Admitting: Family Medicine

## 2012-01-26 ENCOUNTER — Other Ambulatory Visit (HOSPITAL_COMMUNITY): Payer: Self-pay | Admitting: Family Medicine

## 2012-01-26 DIAGNOSIS — M949 Disorder of cartilage, unspecified: Secondary | ICD-10-CM | POA: Insufficient documentation

## 2012-01-26 DIAGNOSIS — M899 Disorder of bone, unspecified: Secondary | ICD-10-CM | POA: Insufficient documentation

## 2012-01-26 DIAGNOSIS — M549 Dorsalgia, unspecified: Secondary | ICD-10-CM | POA: Insufficient documentation

## 2012-01-26 DIAGNOSIS — M25559 Pain in unspecified hip: Secondary | ICD-10-CM | POA: Insufficient documentation

## 2012-01-26 DIAGNOSIS — W19XXXA Unspecified fall, initial encounter: Secondary | ICD-10-CM

## 2012-03-06 ENCOUNTER — Other Ambulatory Visit (HOSPITAL_COMMUNITY): Payer: Self-pay | Admitting: Family Medicine

## 2012-03-06 DIAGNOSIS — M545 Low back pain, unspecified: Secondary | ICD-10-CM

## 2012-03-06 DIAGNOSIS — M5410 Radiculopathy, site unspecified: Secondary | ICD-10-CM

## 2012-03-08 ENCOUNTER — Other Ambulatory Visit (HOSPITAL_COMMUNITY): Payer: Self-pay

## 2012-03-16 ENCOUNTER — Ambulatory Visit (HOSPITAL_COMMUNITY)
Admission: RE | Admit: 2012-03-16 | Discharge: 2012-03-16 | Disposition: A | Discharge status: Home / Self Care | Payer: Self-pay | Source: Ambulatory Visit | Attending: Family Medicine | Admitting: Family Medicine

## 2012-03-16 DIAGNOSIS — IMO0002 Reserved for concepts with insufficient information to code with codable children: Secondary | ICD-10-CM | POA: Insufficient documentation

## 2012-03-16 DIAGNOSIS — M48061 Spinal stenosis, lumbar region without neurogenic claudication: Secondary | ICD-10-CM | POA: Insufficient documentation

## 2012-03-16 DIAGNOSIS — M545 Low back pain, unspecified: Secondary | ICD-10-CM | POA: Insufficient documentation

## 2012-03-16 DIAGNOSIS — M949 Disorder of cartilage, unspecified: Secondary | ICD-10-CM | POA: Insufficient documentation

## 2012-03-16 DIAGNOSIS — M899 Disorder of bone, unspecified: Secondary | ICD-10-CM | POA: Insufficient documentation

## 2012-03-16 DIAGNOSIS — M5410 Radiculopathy, site unspecified: Secondary | ICD-10-CM

## 2013-01-18 ENCOUNTER — Emergency Department (HOSPITAL_COMMUNITY): Payer: Self-pay

## 2013-01-18 ENCOUNTER — Encounter (HOSPITAL_COMMUNITY): Payer: Self-pay | Admitting: *Deleted

## 2013-01-18 ENCOUNTER — Emergency Department (HOSPITAL_COMMUNITY)
Admission: EM | Admit: 2013-01-18 | Discharge: 2013-01-19 | Disposition: A | Discharge status: Home / Self Care | Payer: Self-pay | Attending: Emergency Medicine | Admitting: Emergency Medicine

## 2013-01-18 DIAGNOSIS — Z8679 Personal history of other diseases of the circulatory system: Secondary | ICD-10-CM | POA: Insufficient documentation

## 2013-01-18 DIAGNOSIS — Z8719 Personal history of other diseases of the digestive system: Secondary | ICD-10-CM | POA: Insufficient documentation

## 2013-01-18 DIAGNOSIS — Y9301 Activity, walking, marching and hiking: Secondary | ICD-10-CM | POA: Insufficient documentation

## 2013-01-18 DIAGNOSIS — R609 Edema, unspecified: Secondary | ICD-10-CM | POA: Insufficient documentation

## 2013-01-18 DIAGNOSIS — IMO0002 Reserved for concepts with insufficient information to code with codable children: Secondary | ICD-10-CM | POA: Insufficient documentation

## 2013-01-18 DIAGNOSIS — Z79899 Other long term (current) drug therapy: Secondary | ICD-10-CM | POA: Insufficient documentation

## 2013-01-18 DIAGNOSIS — Z87891 Personal history of nicotine dependence: Secondary | ICD-10-CM | POA: Insufficient documentation

## 2013-01-18 DIAGNOSIS — Y9269 Other specified industrial and construction area as the place of occurrence of the external cause: Secondary | ICD-10-CM | POA: Insufficient documentation

## 2013-01-18 DIAGNOSIS — Z862 Personal history of diseases of the blood and blood-forming organs and certain disorders involving the immune mechanism: Secondary | ICD-10-CM | POA: Insufficient documentation

## 2013-01-18 DIAGNOSIS — S8990XA Unspecified injury of unspecified lower leg, initial encounter: Secondary | ICD-10-CM | POA: Insufficient documentation

## 2013-01-18 DIAGNOSIS — I10 Essential (primary) hypertension: Secondary | ICD-10-CM | POA: Insufficient documentation

## 2013-01-18 DIAGNOSIS — Z8739 Personal history of other diseases of the musculoskeletal system and connective tissue: Secondary | ICD-10-CM | POA: Insufficient documentation

## 2013-01-18 DIAGNOSIS — M549 Dorsalgia, unspecified: Secondary | ICD-10-CM

## 2013-01-18 DIAGNOSIS — W19XXXA Unspecified fall, initial encounter: Secondary | ICD-10-CM

## 2013-01-18 DIAGNOSIS — W108XXA Fall (on) (from) other stairs and steps, initial encounter: Secondary | ICD-10-CM | POA: Insufficient documentation

## 2013-01-18 DIAGNOSIS — Y990 Civilian activity done for income or pay: Secondary | ICD-10-CM | POA: Insufficient documentation

## 2013-01-18 DIAGNOSIS — M25572 Pain in left ankle and joints of left foot: Secondary | ICD-10-CM

## 2013-01-18 LAB — URINE MICROSCOPIC-ADD ON

## 2013-01-18 LAB — URINALYSIS, ROUTINE W REFLEX MICROSCOPIC
Glucose, UA: NEGATIVE mg/dL
Ketones, ur: NEGATIVE mg/dL
Nitrite: NEGATIVE
Specific Gravity, Urine: 1.012 (ref 1.005–1.030)
pH: 6 (ref 5.0–8.0)
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
## 2013-01-18 NOTE — ED Notes (Signed)
Pt states she fell 2 days ago and injured left foot.  Today, states she has had increasing pain in her lower back since.

## 2013-01-19 MED ORDER — CYCLOBENZAPRINE HCL 10 MG PO TABS: 10.0000 mg | ORAL_TABLET | Freq: Three times a day (TID) | ORAL | Status: DC | PRN

## 2013-01-19 NOTE — ED Provider Notes (Signed)
Medical screening examination/treatment/procedure(s) were performed by non-physician practitioner and as supervising physician I was immediately available for consultation/collaboration.   Richardean Canal, MD 01/19/13 [phone]

## 2013-01-20 LAB — URINE CULTURE

## 2013-06-03 ENCOUNTER — Ambulatory Visit: Payer: Self-pay | Attending: Family Medicine

## 2013-06-10 ENCOUNTER — Ambulatory Visit: Payer: No Typology Code available for payment source | Attending: Internal Medicine | Admitting: Internal Medicine

## 2013-06-10 VITALS — BP 118/80 | HR 77 | Temp 98.9°F | Ht 60.0 in | Wt 165.0 lb

## 2013-06-10 DIAGNOSIS — I10 Essential (primary) hypertension: Secondary | ICD-10-CM | POA: Insufficient documentation

## 2013-06-10 DIAGNOSIS — G8929 Other chronic pain: Secondary | ICD-10-CM | POA: Insufficient documentation

## 2013-06-10 DIAGNOSIS — R079 Chest pain, unspecified: Secondary | ICD-10-CM | POA: Insufficient documentation

## 2013-06-10 DIAGNOSIS — R070 Pain in throat: Secondary | ICD-10-CM

## 2013-06-10 LAB — CBC WITH DIFFERENTIAL/PLATELET
HCT: 43.2 % (ref 36.0–46.0)
Hemoglobin: 14.9 g/dL (ref 12.0–15.0)
Lymphocytes Relative: 32 % (ref 12–46)
Monocytes Absolute: 0.4 10*3/uL (ref 0.1–1.0)
Monocytes Relative: 5 % (ref 3–12)
Neutro Abs: 4.5 10*3/uL (ref 1.7–7.7)
WBC: 7.6 10*3/uL (ref 4.0–10.5)

## 2013-06-10 MED ORDER — OMEPRAZOLE 40 MG PO CPDR: 40.0000 mg | DELAYED_RELEASE_CAPSULE | Freq: Every day | ORAL | Status: DC

## 2013-06-10 MED ORDER — IBUPROFEN 600 MG PO TABS: 600.0000 mg | ORAL_TABLET | Freq: Four times a day (QID) | ORAL | Status: DC | PRN

## 2013-06-10 MED ORDER — LISINOPRIL 10 MG PO TABS: 10.0000 mg | ORAL_TABLET | Freq: Every day | ORAL | Status: DC

## 2013-06-10 NOTE — Progress Notes (Signed)
Patient ID: Susan Lopez, female   DOB: 22-Jan-1961, 52 y.o.   MRN: [id number]  CC: To establish care  HPI: Patient was sent in the clinic today to establish medical care. She also complained of chest pain mostly central, radiating to the back, worse with eating. No known relieving factors, sometimes associated with nausea and vomiting. She also has chronic throat pain, that has been there for about 4 years, sometimes makes it difficult to swallow. She has history of hypertension but No past history of heart disease.   Allergies  Allergen Reactions  . Hydrocodone Nausea And Vomiting  . Oxycodone Rash   Past Medical History  Diagnosis Date  . Iron deficiency anemia   . Hypertension   . Arthritis   . Gallstones   . Hemorrhoids, external 10-21-11    occ. bothersome  . Calculus of gallbladder with acute and chronic cholecystitis without obstruction, s/p lap chole 27Dec2012 09/19/2011   No current outpatient prescriptions on file prior to visit.   No current facility-administered medications on file prior to visit.   No family history on file. History   Social History  . Marital Status: Single    Spouse Name: N/A    Number of Children: N/A  . Years of Education: N/A   Occupational History  . Not on file.   Social History Main Topics  . Smoking status: Former Smoker    Quit date: 10/20/2004  . Smokeless tobacco: Not on file  . Alcohol Use: No  . Drug Use: No  . Sexually Active: Yes   Other Topics Concern  . Not on file   Social History Narrative  . No narrative on file    Review of Systems: Constitutional: Negative for fever, chills, diaphoresis, activity change, appetite change and fatigue. HENT: Negative for ear pain, nosebleeds, congestion, facial swelling, rhinorrhea, neck pain, neck stiffness and ear discharge.  Eyes: Negative for pain, discharge, redness, itching and visual disturbance. Respiratory: Negative for cough, choking, chest tightness, shortness  of breath, wheezing and stridor.  Cardiovascular: Negative for chest pain, palpitations and leg swelling. Gastrointestinal: Negative for abdominal distention. Genitourinary: Negative for dysuria, urgency, frequency, hematuria, flank pain, decreased urine volume, difficulty urinating and dyspareunia.  Musculoskeletal: Negative for back pain, joint swelling, arthralgias and gait problem. Neurological: Negative for dizziness, tremors, seizures, syncope, facial asymmetry, speech difficulty, weakness, light-headedness, numbness and headaches.  Hematological: Negative for adenopathy. Does not bruise/bleed easily. Psychiatric/Behavioral: Negative for hallucinations, behavioral problems, confusion, dysphoric mood, decreased concentration and agitation.    Objective:   Filed Vitals:   06/10/13 1605  BP: 118/80  Pulse: 77  Temp: 98.9 F (37.2 C)    Physical Exam: Constitutional: Patient appears well-developed and well-nourished. No distress. HENT: Normocephalic, atraumatic, External right and left ear normal. Oropharynx is clear and moist.  Eyes: Conjunctivae and EOM are normal. PERRLA, no scleral icterus. Neck: Normal ROM. Neck supple. No JVD. No tracheal deviation. No thyromegaly. CVS: RRR, S1/S2 +, no murmurs, no gallops, no carotid bruit.  Pulmonary: Effort and breath sounds normal, no stridor, rhonchi, wheezes, rales.  Abdominal: Soft. BS +,  no distension, tenderness, rebound or guarding.  Musculoskeletal: Normal range of motion. No edema and no tenderness.  Lymphadenopathy: No lymphadenopathy noted, cervical, inguinal or axillary Neuro: Alert. Normal reflexes, muscle tone coordination. No cranial nerve deficit. Skin: Skin is warm and dry. No rash noted. Not diaphoretic. No erythema. No pallor. Psychiatric: Normal mood and affect. Behavior, judgment, thought content normal.  Lab Results  Component Value Date  WBC 7.2 10/21/2011   HGB 14.1 10/21/2011   HCT 40.8 10/21/2011   MCV  88.5 10/21/2011   PLT 254 10/21/2011   Lab Results  Component Value Date   CREATININE 0.61 10/21/2011   BUN 11 10/21/2011   NA 140 10/21/2011   K 3.8 10/21/2011   CL 105 10/21/2011   CO2 27 10/21/2011    No results found for this basename: HGBA1C   Lipid Panel  No results found for this basename: chol, trig, hdl, cholhdl, vldl, ldlcalc       Assessment and plan:   Patient Active Problem List   Diagnosis Date Noted  . Unspecified essential hypertension 06/10/2013  . Chest pain, unspecified 06/10/2013  . Chronic throat pain 06/10/2013  . Back pain 11/14/2011  . Obesity (BMI 30-39.9) 11/14/2011  . GERD (gastroesophageal reflux disease) 09/19/2011  . Calculus of gallbladder with acute and chronic cholecystitis without obstruction, s/p lap chole 27Dec2012 09/19/2011  . Hemorrhoids, internal, with bleeding 09/19/2011  . ANEMIA-IRON DEFICIENCY 03/19/2009  . CHEST PAIN, ATYPICAL 03/19/2009   Lab tests: CBC D. Comprehensive metabolic panel Lipid profile Urinalysis Lipase and amylase EKG today: Normal sinus rhythm, nonspecific ST-T wave changes  Hypertension: Lisinopril 10 mg by mouth tablet daily Pain: Ibuprofen 600 mg tablet by mouth every 6 hour when necessary pain Chronic Throat pain: ENT surgery referral Omeprazole 40 mg caps by mouth daily  Results of above tests will be reviewed with patient and subsequent management will depend on findings  Interpreter was used to communicate directly with patient for the entire encounter including providing detailed patient instructions.   Alaisha Eversley was given clear instructions to go to ER or return to the clinic if symptoms don't improve, worsen or new problems develop.  Shireen Leyva-Valdivia verbalized understanding.  Lanika Colgate was told to call to get lab results if hasn't heard anything in the next week.        Jeanann Lewandowsky, MD [facility] [place],  [place] [phone]   06/10/2013, 5:06 PM

## 2013-06-11 ENCOUNTER — Telehealth: Payer: Self-pay | Admitting: *Deleted

## 2013-06-11 LAB — COMPLETE METABOLIC PANEL WITH GFR
Alkaline Phosphatase: 103 U/L (ref 39–117)
BUN: 11 mg/dL (ref 6–23)
GFR, Est Non African American: >89 mL/min
Glucose, Bld: 91 mg/dL (ref 70–99)
Sodium: 140 mEq/L (ref 135–145)
Total Bilirubin: 0.6 mg/dL (ref 0.3–1.2)

## 2013-06-11 LAB — LIPASE: Lipase: 21 U/L (ref 0–75)

## 2013-06-11 NOTE — Telephone Encounter (Signed)
06/11/13 Attempt to reach patient not available message left via telephone for patient to return call To discuss lab result. P.Janneth Krasner,RN BSN [facility]

## 2013-07-08 ENCOUNTER — Ambulatory Visit: Payer: No Typology Code available for payment source | Attending: Internal Medicine | Admitting: Internal Medicine

## 2013-07-08 ENCOUNTER — Encounter: Payer: Self-pay | Admitting: Internal Medicine

## 2013-07-08 ENCOUNTER — Other Ambulatory Visit: Payer: Self-pay | Admitting: Internal Medicine

## 2013-07-08 VITALS — BP 122/81 | HR 90 | Temp 98.6°F | Resp 16 | Ht 59.84 in | Wt 166.0 lb

## 2013-07-08 DIAGNOSIS — M545 Low back pain, unspecified: Secondary | ICD-10-CM | POA: Insufficient documentation

## 2013-07-08 DIAGNOSIS — Z09 Encounter for follow-up examination after completed treatment for conditions other than malignant neoplasm: Secondary | ICD-10-CM | POA: Insufficient documentation

## 2013-07-08 DIAGNOSIS — M549 Dorsalgia, unspecified: Secondary | ICD-10-CM

## 2013-07-08 DIAGNOSIS — K219 Gastro-esophageal reflux disease without esophagitis: Secondary | ICD-10-CM

## 2013-07-08 DIAGNOSIS — M25519 Pain in unspecified shoulder: Secondary | ICD-10-CM | POA: Insufficient documentation

## 2013-07-08 DIAGNOSIS — I10 Essential (primary) hypertension: Secondary | ICD-10-CM | POA: Insufficient documentation

## 2013-07-08 DIAGNOSIS — R5381 Other malaise: Secondary | ICD-10-CM | POA: Insufficient documentation

## 2013-07-08 DIAGNOSIS — R079 Chest pain, unspecified: Secondary | ICD-10-CM

## 2013-07-08 DIAGNOSIS — M542 Cervicalgia: Secondary | ICD-10-CM | POA: Insufficient documentation

## 2013-07-08 MED ORDER — MELOXICAM 15 MG PO TABS: 15.0000 mg | ORAL_TABLET | Freq: Every day | ORAL | Status: DC

## 2013-07-08 MED ORDER — GABAPENTIN 100 MG PO CAPS: 100.0000 mg | ORAL_CAPSULE | Freq: Two times a day (BID) | ORAL | Status: DC

## 2013-07-08 MED ORDER — OMEPRAZOLE 40 MG PO CPDR: 40.0000 mg | DELAYED_RELEASE_CAPSULE | Freq: Every day | ORAL | Status: DC

## 2013-07-08 MED ORDER — LISINOPRIL 10 MG PO TABS: 10.0000 mg | ORAL_TABLET | Freq: Every day | ORAL | Status: DC

## 2013-07-08 MED ORDER — ACETAMINOPHEN 500 MG PO TABS: 500.0000 mg | ORAL_TABLET | Freq: Four times a day (QID) | ORAL | Status: DC | PRN

## 2013-07-08 NOTE — Progress Notes (Signed)
Patient Demographics  Susan Lopez, is a 52 y.o. female  NWG:[id number]  VHQ:[id number]  DOB - 03-21-1961  Chief Complaint  Patient presents with  . Follow-up       Subjective:   Susan Lopez today is here for a follow up visit. She apparently fell last March, continues to have persistent back pain since then. X-rays done during that time were negative. She claims to have pain in the bilateral shoulder girdle area and neck area as well. This has been going on for 2-3 years. She claims to have GERD-like symptoms- which has been essentially unchanged over the past few weeks. She continues to take omeprazole  Patient has No headache, No chest pain, No abdominal pain - No Nausea, No new weakness tingling or numbness, No Cough - SOB.   Objective:    Filed Vitals:   07/08/13 1558  BP: 122/81  Pulse: 90  Temp: 98.6 F (37 C)  TempSrc: Oral  Resp: 16  Height: 4' 11.84" (1.52 m)  Weight: 166 lb (75.297 kg)  SpO2: 97%     ALLERGIES:   Allergies  Allergen Reactions  . Hydrocodone Nausea And Vomiting  . Oxycodone Rash    PAST MEDICAL HISTORY: Past Medical History  Diagnosis Date  . Iron deficiency anemia   . Hypertension   . Arthritis   . Gallstones   . Hemorrhoids, external 10-21-11    occ. bothersome  . Calculus of gallbladder with acute and chronic cholecystitis without obstruction, s/p lap chole 27Dec2012 09/19/2011    MEDICATIONS AT HOME: Prior to Admission medications   Medication Sig Start Date End Date Taking? Authorizing Provider  lisinopril (PRINIVIL,ZESTRIL) 10 MG tablet Take 1 tablet (10 mg total) by mouth daily. 07/08/13  Yes Shanker Susan Dredge, MD  omeprazole (PRILOSEC) 40 MG capsule Take 1 capsule (40 mg total) by mouth daily. 07/08/13  Yes Shanker Susan Dredge, MD  acetaminophen (TYLENOL) 500 MG tablet Take 1 tablet (500 mg total) by mouth every 6 (six) hours as needed for pain. 07/08/13   Shanker Susan Dredge, MD  gabapentin (NEURONTIN) 100  MG capsule Take 1 capsule (100 mg total) by mouth 2 (two) times daily. 07/08/13   Shanker Susan Dredge, MD  meloxicam (MOBIC) 15 MG tablet Take 1 tablet (15 mg total) by mouth daily. 07/08/13   Shanker Susan Dredge, MD     Exam  General appearance :Awake, alert, not in any distress. Speech Clear. Not toxic Looking HEENT: Atraumatic and Normocephalic, pupils equally reactive to light and accomodation Neck: supple, no JVD. No cervical lymphadenopathy.  Chest:Good air entry bilaterally, no added sounds  CVS: S1 S2 regular, no murmurs.  Abdomen: Bowel sounds present, Non tender and not distended with no gaurding, rigidity or rebound. Extremities: B/L Lower Ext shows no edema, both legs are warm to touch Neurology: Awake alert, and oriented X 3, CN II-XII intact, Non focal Skin:No Rash Wounds:N/A    Data Review   CBC No results found for this basename: WBC, HGB, HCT, PLT, MCV, MCH, MCHC, RDW, NEUTRABS, LYMPHSABS, MONOABS, EOSABS, BASOSABS, BANDABS, BANDSABD,  in the last 168 hours  Chemistries   No results found for this basename: NA, K, CL, CO2, GLUCOSE, BUN, CREATININE, GFRCGP, CALCIUM, MG, AST, ALT, ALKPHOS, BILITOT,  in the last 168 hours ------------------------------------------------------------------------------------------------------------------ No results found for this basename: HGBA1C,  in the last 72 hours ------------------------------------------------------------------------------------------------------------------ No results found for this basename: CHOL, HDL, LDLCALC, TRIG, CHOLHDL, LDLDIRECT,  in the last 72 hours ------------------------------------------------------------------------------------------------------------------ No results found for  this basename: TSH, T4TOTAL, FREET3, T3FREE, THYROIDAB,  in the last 72 hours ------------------------------------------------------------------------------------------------------------------ No results found for this basename:  VITAMINB12, FOLATE, FERRITIN, TIBC, IRON, RETICCTPCT,  in the last 72 hours  Coagulation profile  No results found for this basename: INR, PROTIME,  in the last 168 hours    Assessment & Plan   Hypertension - Continue with lisinopril  GERD - Continue with omeprazole  Persistent low back pain since March of 2014 after a fall - Will check MRI of her lumbosacral spine - Changed from ibuprofen to Mobic - When necessary Tylenol  ? Fibromyalgia - Complains of pain in her shoulders are laterally, neck area, feels fatigued - Check ESR - Trial of Neurontin  Health Maintenance -Colonoscopy:- Will refer to gastroenterology -Pap Smear: Will refer to GYN -Mammogram: Will order  Follow up in 3 weeks, please follow MRI of the lumbosacral spine, ESR, lipid panel and TSH during that visit  The patient was given clear instructions to go to ER or return to medical center if symptoms don't improve, worsen or new problems develop. The patient verbalized understanding. The patient was told to call to get lab results if they haven't heard anything in the next week.

## 2013-07-08 NOTE — Addendum Note (Signed)
Addended by: Maretta Bees on: 07/08/2013 04:51 PM   Modules accepted: Orders

## 2013-07-08 NOTE — Progress Notes (Signed)
Pt is here for F/U visit here to discuss lab results. PT was here last visit with high blood pressure. Today after medication BP is normal. Pt also reports of severe lower back pain. Pain scale 8, stabbing, constant and radiates down the hip and shifts to each leg.  Pt reports falling at work and was hospitalized January 16, 2013

## 2013-07-09 NOTE — Telephone Encounter (Signed)
Medication reordered.

## 2013-07-10 ENCOUNTER — Ambulatory Visit: Payer: No Typology Code available for payment source

## 2013-07-19 ENCOUNTER — Ambulatory Visit: Payer: No Typology Code available for payment source | Admitting: Internal Medicine

## 2013-07-26 ENCOUNTER — Encounter: Payer: Self-pay | Admitting: Medical

## 2013-08-08 ENCOUNTER — Ambulatory Visit: Payer: No Typology Code available for payment source

## 2013-08-12 ENCOUNTER — Ambulatory Visit
Admission: RE | Admit: 2013-08-12 | Discharge: 2013-08-12 | Disposition: A | Discharge status: Home / Self Care | Payer: No Typology Code available for payment source | Source: Ambulatory Visit | Attending: Internal Medicine | Admitting: Internal Medicine

## 2013-08-12 DIAGNOSIS — I10 Essential (primary) hypertension: Secondary | ICD-10-CM

## 2013-08-12 DIAGNOSIS — R079 Chest pain, unspecified: Secondary | ICD-10-CM

## 2013-08-12 DIAGNOSIS — M549 Dorsalgia, unspecified: Secondary | ICD-10-CM

## 2013-08-12 DIAGNOSIS — K219 Gastro-esophageal reflux disease without esophagitis: Secondary | ICD-10-CM

## 2013-08-14 ENCOUNTER — Other Ambulatory Visit: Payer: Self-pay | Admitting: Internal Medicine

## 2013-08-14 DIAGNOSIS — N63 Unspecified lump in unspecified breast: Secondary | ICD-10-CM

## 2013-08-14 DIAGNOSIS — N644 Mastodynia: Secondary | ICD-10-CM

## 2013-08-22 ENCOUNTER — Ambulatory Visit (INDEPENDENT_AMBULATORY_CARE_PROVIDER_SITE_OTHER): Payer: No Typology Code available for payment source | Admitting: Medical

## 2013-08-22 ENCOUNTER — Encounter: Payer: Self-pay | Admitting: Medical

## 2013-08-22 VITALS — BP 109/74 | HR 78 | Temp 97.6°F | Ht 59.0 in | Wt 162.0 lb

## 2013-08-22 DIAGNOSIS — Z01419 Encounter for gynecological examination (general) (routine) without abnormal findings: Secondary | ICD-10-CM

## 2013-08-22 NOTE — Patient Instructions (Signed)
Perimenopausia (Perimenopause) La perimenopausia es el perodo en el que el organismo se prepara para la menopausia (ausencia de perodo menstrual durante 12 meses). Es un proceso natural. Puede comenzar entre 2 y 8 aos antes y generalmente dura hasta 1 ao despus de la menopausia. Durante este perodo los ovarios pueden o no producir vulos. Cada mes, los ovarios varan en la produccin de estrgenos y progesterona. Esto causa perodos menstruales irregulares, dificultad para quedar embarazada, hemorragias vaginales entre perodos y sntomas molestos. CAUSAS  Produccin irregular de hormonas ovricas, estrgenos y progesterona, y falta de ovulacin.  Entre otras causas se incluyen:  Tumor en la glndula pituitaria, ubicada en el cerebro.  Enfermedades que afectan los ovarios.  Tratamientos de radioterapia.  Quimioterapia.  Causas desconocidas.  El fumar y consumir alcohol en exceso puede ocasionar una menopausia prematura. SNTOMAS  Sofocos.  Sudoracin nocturna.  Perodos menstruales irregulares.  Disminucin del impulso sexual.  Sequedad vaginal.  Cefaleas.  Trastornos del estado de nimo.  Depresin.  Problemas de memoria.  Irritabilidad.  Cansancio.  Aumento de peso.  Dificultad para quedar embarazada.  Comienza a perderse la masa sea (osteoporosis).  Comienzan a endurecerse las arterias (aterosclerosis). DIAGNSTICO El medico har el diagnstico basndose en su edad, la historia de los ciclos menstruales y sus sntomas. Realizar un examen fsico para notar cualquier cambio en el cuerpo, especialmente en los rganos femeninos. Los anlisis de las hormonas femeninas pueden o no ser de ayuda, segn la cantidad y el momento en que se producen. Sin embargo, otros anlisis de hormonas pueden ser de ayuda (por ejemplo el de hormona tiroidea) para descartar otros problemas. TRATAMIENTO La decisin de recibir tratamiento durante la perimenopausia debe tomarse  en conjunto entre usted y su mdico, segn el grado en que los sntomas la afecten a usted y a su estilo de vida. Existen varios tipos de tratamiento disponibles como:  Tratamiento de los sntomas individuales con un medicamento especfico para ese sntoma (por ejemplo un tranquilizante para la depresin).  Hierbas que pueden ayudar en algunos sntomas especficos.  Psicoterapia.  Terapia grupal.  No recibir tratamiento. INSTRUCCIONES PARA EL CUIDADO DOMICILIARIO  Antes de consultar con su mdico haga una lista con los datos de sus perodos menstruales (cuando ocurrieron, cun abundantes son, cuanto tiempo pasa entre perodos y cunto duran), con sus sntomas y la fecha en que comenzaron.  Tome los otros medicamentos que le indic el profesional que lo asiste.  Duerma y descanse.  Practique ejercicios.  Consuma una dieta rica en calcio (buena para los huesos) y soja (acta como un estrgeno).  No fume.  Evite las bebidas alcohlicas.  El consumo de vitamina E puede ayudar en ciertos casos.  Tome suplementos de calcio y vitamina D para evitar la prdida de masa sea.  En algunos casos es de gran ayuda la terapia grupal.  En algunos casos puede ser de utilidad la acupuntura. SOLICITE ANTENCIN MDICA SI:  Tiene alguno de los sntomas mencionados y quiere saber si est en la perimenopausia.  Quiere consejo y tratamiento para alguno de los sntomas mencionados.  Necesita ser derivada a un especialista (gineclogo, psiquiatra o psiclogo). SOLICITE ATENCIN MDICA DE INMEDIATO SI:  Presenta una hemorragia vaginal abundante.  Presenta el perodo dura ms de 8 das.  Presenta los perodos aparecen en intervalos menores a 21 das.  Presenta tiene una hemorragia despus de mantener relaciones sexuales.  Presenta sufre una depresin severa.  Presenta siente dolor al orinar.  Presenta tiene dolores de cabeza intensos.  Presenta   desarrolla trastornos visuales. Document  Released: 10/17/2005 Document Revised: 01/09/2012 [employer] Patient Information 2014 [employer].  

## 2013-08-22 NOTE — Progress Notes (Signed)
Patient ID: Susan Lopez, female   DOB: 31-Jul-1961, 52 y.o.   MRN: [id number] Subjective:    Susan Lopez is a 52 y.o. female who presents for annual exam. The patient states that she noticed about 6 months ago some areas of white developing on the skin of her leg. She denies itching or irritation of this area. There has been no recent change. The patient also states that she has had a rash on her labia a few weeks ago that resolved after 3-4 days. There is no rash currently. The patient is not currently sexually active. GYN screening history: last pap: approximate date 2011 and was normal. The patient is not taking hormone replacement therapy. Patient denies post-menopausal vaginal bleeding.. The patient wears seatbelts: yes. The patient participates in regular exercise: yes. Has the patient ever been transfused or tattooed?: no. The patient reports that there is not domestic violence in her life. The patient also endorses occasional dysuria x 3 years.    Menstrual History: OB History   Grav Para Term Preterm Abortions TAB SAB Ect Mult Living   4 3 3  0 1 0 1 0 1 2      Patient's last menstrual period was 08/20/2010.    The following portions of the patient's history were reviewed and updated as appropriate: allergies, current medications, past family history, past medical history, past social history, past surgical history and problem list.  Review of Systems Pertinent items are noted in HPI.    Objective:     BP 109/74  Pulse 78  Temp(Src) 97.6 F (36.4 C) (Oral)  Ht 4\' 11"  (1.499 m)  Wt 162 lb (73.483 kg)  BMI 32.7 kg/m2  LMP 08/20/2010 GENERAL: Well-developed, well-nourished female in no acute distress.  HEENT: Normocephalic, atraumatic. Sclerae anicteric.  NECK: Supple. Normal thyroid.  LUNGS: Clear to auscultation bilaterally.  HEART: Regular rate and rhythm. BREASTS: Symmetric in size. No masses, skin changes, nipple drainage, or lymphadenopathy. ABDOMEN:  Soft, nontender, nondistended. No organomegaly. Normal bowel sounds in all four quadrants.  PELVIC: Normal external female genitalia. Vagina is pink and rugated.  Normal discharge. Normal cervix contour. Pap smear obtained. Scant bleeding noted after pap smear obtained. Uterus is normal in size. No adnexal mass or tenderness.  SKIN: Small area of hypopigmentation noted on the left inner thigh EXTREMITIES: No cyanosis, clubbing, or edema.        MDM Discussed patient's bleeding patterns with Dr. Macon Large Most likely patient is still spotting due to peri-menopause. No need for further work-up at this time.   Assessment:    Normal gyn exam   Peri-menopause  Plan:    1. Patient will be contacted with any abnormal pap results  2. Mammogram scheduled today 3. Patient encouraged to return to PCP for further evaluation and management of hypopigmentation on the inner thigh and urinary complaints as neither of these are acute issues 4. Patient to return to [facility] clinic in 1 year for annual exam or sooner PRN

## 2013-08-27 ENCOUNTER — Other Ambulatory Visit: Payer: Self-pay | Admitting: Internal Medicine

## 2013-08-27 ENCOUNTER — Telehealth: Payer: Self-pay | Admitting: General Practice

## 2013-08-27 NOTE — Telephone Encounter (Signed)
Called patient to inform her of mammogram appt for 11/14 at 7:30. No answer- left message that we are calling about an appt, please call us back at the clinics

## 2013-08-27 NOTE — Telephone Encounter (Signed)
Patient called back to front office, informed her of 11/14 at 7:30 appt. Patient verbalized understanding and had no further questions

## 2013-09-13 ENCOUNTER — Telehealth: Payer: Self-pay | Admitting: Hematology and Oncology

## 2013-09-13 ENCOUNTER — Encounter: Payer: Self-pay | Admitting: Internal Medicine

## 2013-09-13 ENCOUNTER — Ambulatory Visit: Payer: No Typology Code available for payment source | Attending: Internal Medicine | Admitting: Internal Medicine

## 2013-09-13 ENCOUNTER — Ambulatory Visit (HOSPITAL_COMMUNITY)
Admission: RE | Admit: 2013-09-13 | Discharge: 2013-09-13 | Disposition: A | Discharge status: Home / Self Care | Payer: No Typology Code available for payment source | Source: Ambulatory Visit | Attending: Medical | Admitting: Medical

## 2013-09-13 VITALS — BP 126/79 | HR 74 | Temp 98.2°F | Resp 16 | Wt 164.0 lb

## 2013-09-13 DIAGNOSIS — B3749 Other urogenital candidiasis: Secondary | ICD-10-CM

## 2013-09-13 DIAGNOSIS — Z1231 Encounter for screening mammogram for malignant neoplasm of breast: Secondary | ICD-10-CM | POA: Insufficient documentation

## 2013-09-13 DIAGNOSIS — Z01419 Encounter for gynecological examination (general) (routine) without abnormal findings: Secondary | ICD-10-CM

## 2013-09-13 LAB — POCT URINE PREGNANCY: Preg Test, Ur: NEGATIVE

## 2013-09-13 MED ORDER — DOCUSATE SODIUM 100 MG PO CAPS: 100.0000 mg | ORAL_CAPSULE | Freq: Two times a day (BID) | ORAL | Status: DC | PRN

## 2013-09-13 MED ORDER — CIPROFLOXACIN HCL 500 MG PO TABS: 500.0000 mg | ORAL_TABLET | Freq: Two times a day (BID) | ORAL | Status: DC

## 2013-09-13 NOTE — Progress Notes (Unsigned)
Patient ID: Susan Lopez, female   DOB: 10-28-1961, 52 y.o.   MRN: [id number]  Patient Demographics  Susan Lopez, is a 52 y.o. female  WUJ:[id number]  [id number]  DOB - 1960/12/07  Chief Complaint  Patient presents with  . Urinary Tract Infection        Subjective:   Susan Lopez with History of tonic low back pain, GERD, obesity  is here for chief complaints of dysuria, denies any vaginal discharge, no abdominal pain, no unprotected sex. Denies fever chills, symptoms have been present for the last 2-3 days, she has chronic low back pain which is stable, no new weakness in any extremity, no bowel bladder incontinence.  Denies any subjective complaints except as above, no active headache, no chest abdominal pain at this time, not short of breath. No focal weakness which is new.   Objective:    Patient Active Problem List   Diagnosis Date Noted  . Unspecified essential hypertension 06/10/2013  . Chest pain, unspecified 06/10/2013  . Chronic throat pain 06/10/2013  . Back pain 11/14/2011  . Obesity (BMI 30-39.9) 11/14/2011  . GERD (gastroesophageal reflux disease) 09/19/2011  . Calculus of gallbladder with acute and chronic cholecystitis without obstruction, s/p lap chole 27Dec2012 09/19/2011  . Hemorrhoids, internal, with bleeding 09/19/2011  . ANEMIA-IRON DEFICIENCY 03/19/2009  . CHEST PAIN, ATYPICAL 03/19/2009     Filed Vitals:   09/13/13 1049  BP: 126/79  Pulse: 74  Temp: 98.2 F (36.8 C)  TempSrc: Oral  Resp: 16  Weight: 164 lb (74.39 kg)  SpO2: 98%     Exam   Awake Alert, Oriented X 3, No new F.N deficits, Normal affect Brenas.AT,PERRAL Supple Neck,No JVD, No cervical lymphadenopathy appriciated.  Symmetrical Chest wall movement, Good air movement bilaterally, CTAB RRR,No Gallops,Rubs or new Murmurs, No Parasternal Heave +ve B.Sounds, Abd Soft, Non tender, No organomegaly appriciated, No rebound - guarding or rigidity. No  Cyanosis, Clubbing or edema, No new Rash or bruise  Strength is 5 / 5 in both lower extremities, no paresthesias, no bowel bladder incontinence.    Data Review   Lab Results  Component Value Date   WBC 7.6 06/10/2013   HGB 14.9 06/10/2013   HCT 43.2 06/10/2013   MCV 90.6 06/10/2013   PLT 262 06/10/2013      Chemistry      Component Value Date/Time   NA 140 06/10/2013 1700   K 3.7 06/10/2013 1700   CL 106 06/10/2013 1700   CO2 23 06/10/2013 1700   BUN 11 06/10/2013 1700   CREATININE 0.54 06/10/2013 1700   CREATININE 0.61 10/21/2011 1545      Component Value Date/Time   CALCIUM 9.3 06/10/2013 1700   ALKPHOS 103 06/10/2013 1700   AST 26 06/10/2013 1700   ALT 48* 06/10/2013 1700   BILITOT 0.6 06/10/2013 1700       Lab Results  Component Value Date   HGBA1C 5.4 06/10/2013    No results found for this basename: CHOL, HDL, LDLCALC, LDLDIRECT, TRIG, CHOLHDL    No results found for this basename: TSH    No results found for this basename: PSA      Prior to Admission medications   Medication Sig Start Date End Date Taking? Authorizing Provider  acetaminophen (TYLENOL) 500 MG tablet Take 1 tablet (500 mg total) by mouth every 6 (six) hours as needed for pain. 07/08/13  Yes Shanker Levora Dredge, MD  Calcium-Magnesium-Vitamin D (CALCIUM 500 PO) Take by mouth.   Yes Historical Provider,  MD  ferrous sulfate 325 (65 FE) MG tablet Take 325 mg by mouth daily with breakfast.   Yes Historical Provider, MD  gabapentin (NEURONTIN) 100 MG capsule Take 1 capsule (100 mg total) by mouth 2 (two) times daily. 07/08/13  Yes Shanker Levora Dredge, MD  lisinopril (PRINIVIL,ZESTRIL) 10 MG tablet Take 1 tablet (10 mg total) by mouth daily. 07/08/13  Yes Shanker Levora Dredge, MD  meloxicam (MOBIC) 15 MG tablet Take 1 tablet (15 mg total) by mouth daily. 07/08/13  Yes Shanker Levora Dredge, MD  Multiple Vitamin (MULTIVITAMIN) capsule Take 1 capsule by mouth daily.   Yes Historical Provider, MD  omeprazole (PRILOSEC) 40 MG  capsule Take 1 capsule (40 mg total) by mouth daily. 07/08/13  Yes Shanker Levora Dredge, MD  ciprofloxacin (CIPRO) 500 MG tablet Take 1 tablet (500 mg total) by mouth 2 (two) times daily. 09/13/13   Leroy Sea, MD  docusate sodium (COLACE) 100 MG capsule Take 1 capsule (100 mg total) by mouth 2 (two) times daily as needed for mild constipation. 09/13/13   Leroy Sea, MD     Assessment & Plan    Chronic low back pain. MRI noted. Questionable lesion suspicious for metastatic disease versus multiple myeloma, urgently referred to oncology. She has no focal deficits and pain actually is in good control. Will wait for oncology input. Also she has been referred to GI for colonoscopy along with OB for mammogram, off note patient went for mammogram today and they felt lumps in her breast after which they have rescheduled her for another visit. We'll request OB to kindly evaluate her for breast lumps also.   Constipation. Placed on Colace.    UTI. Obtained UA with culture sensitivity, placed on Cipro     Routine health maintenance.  Flu shot was given last visit.  She has been referred to [facility] for mammogram, Pap smear was unremarkable.  GI referral made for surveillance colonoscopy.   Leroy Sea M.D on 09/13/2013 at 11:03 AM    Patient was given clear instructions to go to ER or return to the clinic if symptoms don't improve, worsen or new problems develop. Patient verbalized understanding. Patient was told to call to get lab results if hasn't heard anything in the next week.

## 2013-09-13 NOTE — Telephone Encounter (Signed)
lvom for pt and gve np appt 11/20 @ 10:30 W/Dr. Bertis Ruddy Referring Dr. Susa Raring Dx- Multiple Myeloma Welcome packet mailed.

## 2013-09-13 NOTE — Progress Notes (Unsigned)
Pt c/o UTI sxs onset 1 month Sxs include: dysuria, abd/back pain,  Denies: f/v/n/d, hematuria Alert w/no signs of acute distress.

## 2013-09-14 LAB — URINALYSIS W MICROSCOPIC + REFLEX CULTURE
Casts: NONE SEEN
Crystals: NONE SEEN
Hgb urine dipstick: NEGATIVE
Leukocytes, UA: NEGATIVE
Nitrite: NEGATIVE
Specific Gravity, Urine: 1.016 (ref 1.005–1.030)
pH: 7 (ref 5.0–8.0)

## 2013-09-16 ENCOUNTER — Telehealth: Payer: Self-pay | Admitting: Hematology and Oncology

## 2013-09-16 NOTE — Telephone Encounter (Signed)
C/D 09/16/13 for appt. 09/19/13

## 2013-09-17 ENCOUNTER — Other Ambulatory Visit: Payer: Self-pay | Admitting: Medical

## 2013-09-17 DIAGNOSIS — R928 Other abnormal and inconclusive findings on diagnostic imaging of breast: Secondary | ICD-10-CM

## 2013-09-19 ENCOUNTER — Ambulatory Visit: Payer: No Typology Code available for payment source | Admitting: Hematology and Oncology

## 2013-09-19 ENCOUNTER — Ambulatory Visit: Payer: No Typology Code available for payment source

## 2013-09-20 ENCOUNTER — Telehealth: Payer: Self-pay | Admitting: Hematology and Oncology

## 2013-09-20 NOTE — Telephone Encounter (Signed)
Pt called to r/s np appt.

## 2013-10-04 ENCOUNTER — Telehealth: Payer: Self-pay | Admitting: Hematology and Oncology

## 2013-10-04 ENCOUNTER — Encounter: Payer: Self-pay | Admitting: *Deleted

## 2013-10-04 ENCOUNTER — Encounter: Payer: Self-pay | Admitting: Hematology and Oncology

## 2013-10-04 ENCOUNTER — Ambulatory Visit (HOSPITAL_BASED_OUTPATIENT_CLINIC_OR_DEPARTMENT_OTHER): Payer: No Typology Code available for payment source | Admitting: Hematology and Oncology

## 2013-10-04 ENCOUNTER — Ambulatory Visit: Payer: No Typology Code available for payment source

## 2013-10-04 VITALS — BP 119/72 | HR 63 | Temp 98.5°F | Resp 18 | Ht 59.0 in | Wt 165.5 lb

## 2013-10-04 DIAGNOSIS — K5900 Constipation, unspecified: Secondary | ICD-10-CM

## 2013-10-04 DIAGNOSIS — M545 Low back pain: Secondary | ICD-10-CM

## 2013-10-04 DIAGNOSIS — M549 Dorsalgia, unspecified: Secondary | ICD-10-CM

## 2013-10-04 DIAGNOSIS — R109 Unspecified abdominal pain: Secondary | ICD-10-CM

## 2013-10-04 DIAGNOSIS — R1032 Left lower quadrant pain: Secondary | ICD-10-CM

## 2013-10-04 DIAGNOSIS — R928 Other abnormal and inconclusive findings on diagnostic imaging of breast: Secondary | ICD-10-CM

## 2013-10-04 DIAGNOSIS — M899 Disorder of bone, unspecified: Secondary | ICD-10-CM

## 2013-10-04 DIAGNOSIS — D509 Iron deficiency anemia, unspecified: Secondary | ICD-10-CM

## 2013-10-04 NOTE — Progress Notes (Signed)
[facility] CONSULT NOTE  Patient Care Team: Jeanann Lewandowsky, MD as PCP - General (Internal Medicine)  CHIEF COMPLAINTS/PURPOSE OF CONSULTATION:  Abnormal bone lesion, severe back pain, concern for multiple myeloma  HISTORY OF PRESENTING ILLNESS: History was obtained via an interpreter [name] 52 y.o. female is here because of abnormal imaging study of her back. The patient had multiple falls, the first time was 2 years ago and the most recent one about a year ago. She has been complaining of severe lower back pain radiating down to both legs. She is describing her pain as about 5/10 pain. She denies any neurological deficit such as urinary retention or incontinence. Over the last 3 months, she felt that her pain is getting progressively worse. Certain positions such as bending or standing up to get worse. She was prescribed multiple different pain medication but could not tolerate them. She denies history of abnormal bone fracture. She had MRI imaging study done a year ago which showed a lesion around L3/L4 region of indeterminate etiology Patient denies history of recurrent infection or atypical infections such as shingles of meningitis. Denies chills, night sweats, anorexia or abnormal weight loss. She has also been complaining of surgery or left lower quadrant abdominal pain. It is not related to food or bowel habit changes. The patient has on iron supplement for the last 5 years. She complained of chronic constipation once every 3 days. She never complained of melena or hematochezia. She never had a screening colonoscopy. Recently, screening mammogram detected abnormal breast mass. Workup is still pending. This patient felt a lump on the left breast close to the nipple. She denies any nipple changes.  MEDICAL HISTORY:  Past Medical History  Diagnosis Date  . Iron deficiency anemia   . Hypertension   . Arthritis   . Gallstones   . Hemorrhoids, external  10-21-11    occ. bothersome  . Calculus of gallbladder with acute and chronic cholecystitis without obstruction, s/p lap chole 27Dec2012 09/19/2011  . Bone lesion 10/04/2013  . Mammogram abnormal 10/04/2013  . Abdominal pain, unspecified site 10/04/2013    SURGICAL HISTORY: Past Surgical History  Procedure Laterality Date  . Cesarean section  1985, 1992  . Eye surgery  1`12-21-10    bil. for tissue growth-laser surgery  . Cholecystectomy  10/27/2011    Procedure: LAPAROSCOPIC CHOLECYSTECTOMY WITH INTRAOPERATIVE CHOLANGIOGRAM;  Surgeon: Ardeth Sportsman, MD;  Location: [facility] ORS;  Service: General;  Laterality: N/A;  Laparoscopic Chole w/ IOC Single Site    SOCIAL HISTORY: History   Social History  . Marital Status: Single    Spouse Name: N/A    Number of Children: N/A  . Years of Education: N/A   Occupational History  . Not on file.   Social History Main Topics  . Smoking status: Former Smoker -- 0.25 packs/day for 10 years    Quit date: 10/20/2004  . Smokeless tobacco: Never Used  . Alcohol Use: No  . Drug Use: No  . Sexual Activity: Yes    Birth Control/ Protection: Post-menopausal   Other Topics Concern  . Not on file   Social History Narrative  . No narrative on file    FAMILY HISTORY: History reviewed. No pertinent family history.  ALLERGIES:  is allergic to hydrocodone and oxycodone.  MEDICATIONS:  Current Outpatient Prescriptions  Medication Sig Dispense Refill  . acetaminophen (TYLENOL) 500 MG tablet Take 1 tablet (500 mg total) by mouth every 6 (six) hours as needed for pain.  30 tablet  0  . Calcium-Magnesium-Vitamin D (CALCIUM 500 PO) Take by mouth.      . docusate sodium (COLACE) 100 MG capsule Take 1 capsule (100 mg total) by mouth 2 (two) times daily as needed for mild constipation.  30 capsule  0  . ferrous sulfate 325 (65 FE) MG tablet Take 325 mg by mouth daily with breakfast.      . gabapentin (NEURONTIN) 100 MG capsule Take 1 capsule (100 mg total)  by mouth 2 (two) times daily.  60 capsule  3  . lisinopril (PRINIVIL,ZESTRIL) 10 MG tablet Take 1 tablet (10 mg total) by mouth daily.  30 tablet  3  . meloxicam (MOBIC) 15 MG tablet Take 1 tablet (15 mg total) by mouth daily.  30 tablet  3  . Multiple Vitamin (MULTIVITAMIN) capsule Take 1 capsule by mouth daily.      Marland Kitchen omeprazole (PRILOSEC) 40 MG capsule Take 1 capsule (40 mg total) by mouth daily.  30 capsule  3   No current facility-administered medications for this visit.    REVIEW OF SYSTEMS:   Eyes: Denies blurriness of vision, double vision or watery eyes Ears, nose, mouth, throat, and face: Denies mucositis or sore throat Respiratory: Denies cough, dyspnea or wheezes Cardiovascular: Denies palpitation, chest discomfort or lower extremity swelling Gastrointestinal:  Denies nausea, heartburn or change in bowel habits Skin: Denies abnormal skin rashes Lymphatics: Denies new lymphadenopathy or easy bruising Neurological:Denies numbness, tingling or new weaknesses Behavioral/Psych: Mood is stable, no new changes  All other systems were reviewed with the patient and are negative.  PHYSICAL EXAMINATION: ECOG PERFORMANCE STATUS: 0 - Asymptomatic  Filed Vitals:   10/04/13 0929  BP: 119/72  Pulse: 63  Temp: 98.5 F (36.9 C)  Resp: 18   Filed Weights   10/04/13 0929  Weight: 165 lb 8 oz (75.07 kg)    GENERAL:alert, no distress and comfortable. SKIN: skin color, texture, turgor are normal, no rashes or significant lesions EYES: normal, conjunctiva are pink and non-injected, sclera clear OROPHARYNX:no exudate, no erythema and lips, buccal mucosa, and tongue normal  NECK: supple, thyroid normal size, non-tender, without nodularity LYMPH:  no palpable lymphadenopathy in the cervical, axillary or inguinal LUNGS: clear to auscultation and percussion with normal breathing effort HEART: regular rate & rhythm and no murmurs and no lower extremity edema ABDOMEN:abdomen soft,  non-tender and normal bowel sounds Musculoskeletal:no cyanosis of digits and no clubbing . No tenderness on percussion PSYCH: alert & oriented x 3 with fluent speech NEURO: no focal motor/sensory deficits Left breast examination did not detect any dominant mass.  LABORATORY DATA:  I have reviewed the data as listed Lab Results  Component Value Date   WBC 7.6 06/10/2013   HGB 14.9 06/10/2013   HCT 43.2 06/10/2013   MCV 90.6 06/10/2013   PLT 262 06/10/2013    RADIOGRAPHIC STUDIES: I reviewed her MRI scan personally last your and agree with the interpretation. I have personally reviewed the radiological images as listed and agreed with the findings in the report. Mm Digital Screening  09/13/2013   CLINICAL DATA:  Screening.  EXAM: DIGITAL SCREENING BILATERAL MAMMOGRAM WITH CAD  COMPARISON:  Previous Exam(s)  ACR Breast Density Category c: The breasts are heterogeneously dense, which may obscure small masses.  FINDINGS: In the left breast, calcifications warrant further evaluation with magnified views. There is also a possible mass in the left breast which warrants further evaluation with spot compression views and possibly ultrasound. In the right  breast, no mass or malignant type calcifications are identified. Images were processed with CAD.  IMPRESSION: Further evaluation is suggested for calcifications and possible mass in the left breast.  RECOMMENDATION: Diagnostic mammogram and possible ultrasound of the left breast. (Code:[id number])  The patient will be contacted regarding the findings, and additional imaging will be scheduled.  BI-RADS CATEGORY  0. Incomplete. Need additional imaging evaluation and/or prior mammograms for comparison.   Electronically Signed   By: Esperanza Heir M.D.   On: 09/13/2013 16:17    ASSESSMENT:  #1 bone lesion, concern for multiple myeloma, with back pain #2 abnormal breast imaging #3 chronic constipation, abdominal pain, need further evaluation   PLAN:  #1  bone lesion  To rule out multiple myeloma, I recommend complete blood work, 24 hour urine collection for UPEP and skeletal survey to rule out multiple myeloma Depending on test results, we may or may not proceed with bone marrow aspirate and biopsy. #2 abnormal breast imaging I will order a diagnostic mammogram #3 pain in her abdomen I will refer her to GI service for evaluation. #4 history of anemia, constipation Her last blood work indicated anemia has resolved. I have told her to stop iron supplements. I am referring her to the GI service for evaluation and rule out cancer Orders Placed This Encounter  Procedures  . MM Digital Diagnostic Unilat L    Standing Status: Future     Number of Occurrences:      Standing Expiration Date: 10/04/2014    Order Specific Question:  Reason for exam:    Answer:  abnormal mammogram    Order Specific Question:  Preferred imaging location?    Answer:  [facility]  . DG Bone Survey Met    Standing Status: Future     Number of Occurrences:      Standing Expiration Date: 12/04/2014    Order Specific Question:  Reason for Exam (SYMPTOM  OR DIAGNOSIS REQUIRED)    Answer:  staging myeloma    Order Specific Question:  Is the patient pregnant?    Answer:  No    Order Specific Question:  Preferred imaging location?    Answer:  [facility]  . CBC with Differential    Standing Status: Future     Number of Occurrences:      Standing Expiration Date: 06/26/2014  . Comprehensive metabolic panel    Standing Status: Future     Number of Occurrences:      Standing Expiration Date: 10/04/2014  . Beta 2 microglobuline, serum    Standing Status: Future     Number of Occurrences:      Standing Expiration Date: 10/04/2014  . Immunofixation electrophoresis    Standing Status: Future     Number of Occurrences:      Standing Expiration Date: 10/04/2014  . SPEP & IFE with QIG    Standing Status: Future     Number of Occurrences:      Standing  Expiration Date: 10/04/2014  . Protein Electro, 24-Hour Urine    Standing Status: Future     Number of Occurrences:      Standing Expiration Date: 10/04/2014  . Kappa/lambda light chains    Standing Status: Future     Number of Occurrences:      Standing Expiration Date: 10/04/2014  . Immunofixation interpretive, urine    Standing Status: Future     Number of Occurrences:      Standing Expiration Date: 10/04/2014  . Ferritin  Standing Status: Future     Number of Occurrences:      Standing Expiration Date: 10/04/2014  . Iron and TIBC    Standing Status: Future     Number of Occurrences:      Standing Expiration Date: 10/04/2014  . Vitamin D 25 hydroxy    Standing Status: Future     Number of Occurrences:      Standing Expiration Date: 10/04/2014  . Ambulatory referral to Gastroenterology    Referral Priority:  Routine    Referral Type:  Consultation    Referral Reason:  Specialty Services Required    Requested Specialty:  Gastroenterology    Number of Visits Requested:  1    All questions were answered. The patient knows to call the clinic with any problems, questions or concerns.    Malisa Ruggiero, MD 10/04/2013 10:44 AM

## 2013-10-04 NOTE — Progress Notes (Signed)
Checked in new patient. She speaks no english. She has GCCN. I gave her appt card also

## 2013-10-04 NOTE — Progress Notes (Unsigned)
Called [facility] for new pt referral and was told that pt d/c'd from their clinic and they cannot take referral.  Asked for them to re consider allowing pt to have appt.. Was instructed to call Medical Records dept..   Will request desk nurse f/u on next business day.

## 2013-10-04 NOTE — Telephone Encounter (Signed)
Gave pt appt for lab and MD , pt sent to labs today, mammogram scnheduled, tried to schedule [facility], pt has been discharged from thier practice,tried to schedule  @ [facility] and self pay , pt  cannot afford it, will talke to primary MD

## 2013-10-07 ENCOUNTER — Telehealth: Payer: Self-pay | Admitting: *Deleted

## 2013-10-07 ENCOUNTER — Encounter: Payer: Self-pay | Admitting: Internal Medicine

## 2013-10-07 NOTE — Telephone Encounter (Signed)
Made appointment per Dr Bertis Ruddy request with [facility]. (Spoke with Cordelia Pen at [facility]  [phone] appointment) Left voice mail for patient using spanish interpreter. Appointment is 10/09/13 10:30am with Lucrezia Europe, PA. To call us if she has questions.

## 2013-10-08 ENCOUNTER — Other Ambulatory Visit (HOSPITAL_BASED_OUTPATIENT_CLINIC_OR_DEPARTMENT_OTHER): Payer: No Typology Code available for payment source | Admitting: Lab

## 2013-10-08 DIAGNOSIS — M899 Disorder of bone, unspecified: Secondary | ICD-10-CM

## 2013-10-08 DIAGNOSIS — M549 Dorsalgia, unspecified: Secondary | ICD-10-CM

## 2013-10-08 DIAGNOSIS — D509 Iron deficiency anemia, unspecified: Secondary | ICD-10-CM

## 2013-10-08 LAB — CBC WITH DIFFERENTIAL/PLATELET
BASO%: 2.1 % — ABNORMAL HIGH (ref 0.0–2.0)
EOS%: 3.2 % (ref 0.0–7.0)
HCT: 43.2 % (ref 34.8–46.6)
LYMPH%: 33.4 % (ref 14.0–49.7)
MCH: 32.1 pg (ref 25.1–34.0)
MCHC: 34.6 g/dL (ref 31.5–36.0)
MCV: 92.8 fL (ref 79.5–101.0)
MONO%: 6.1 % (ref 0.0–14.0)
NEUT#: 3.5 10*3/uL (ref 1.5–6.5)
NEUT%: 55.2 % (ref 38.4–76.8)
Platelets: 245 10*3/uL (ref 145–400)
WBC: 6.3 10*3/uL (ref 3.9–10.3)

## 2013-10-08 LAB — COMPREHENSIVE METABOLIC PANEL (CC13)
ALT: 45 U/L (ref 0–55)
AST: 21 U/L (ref 5–34)
Albumin: 3.9 g/dL (ref 3.5–5.0)
Anion Gap: 12 mEq/L — ABNORMAL HIGH (ref 3–11)
CO2: 23 mEq/L (ref 22–29)
Creatinine: 0.7 mg/dL (ref 0.6–1.1)
Total Bilirubin: 0.58 mg/dL (ref 0.20–1.20)
Total Protein: 7.6 g/dL (ref 6.4–8.3)

## 2013-10-08 LAB — IRON AND TIBC CHCC
%SAT: 27 % (ref 21–57)
TIBC: 379 ug/dL (ref 236–444)
UIBC: 276 ug/dL (ref 120–384)

## 2013-10-09 ENCOUNTER — Ambulatory Visit (INDEPENDENT_AMBULATORY_CARE_PROVIDER_SITE_OTHER): Payer: No Typology Code available for payment source | Admitting: Nurse Practitioner

## 2013-10-09 ENCOUNTER — Encounter: Payer: Self-pay | Admitting: Nurse Practitioner

## 2013-10-09 VITALS — BP 118/70 | HR 66 | Ht 60.0 in | Wt 166.0 lb

## 2013-10-09 DIAGNOSIS — K5909 Other constipation: Secondary | ICD-10-CM | POA: Insufficient documentation

## 2013-10-09 DIAGNOSIS — K219 Gastro-esophageal reflux disease without esophagitis: Secondary | ICD-10-CM

## 2013-10-09 DIAGNOSIS — R109 Unspecified abdominal pain: Secondary | ICD-10-CM

## 2013-10-09 DIAGNOSIS — K5900 Constipation, unspecified: Secondary | ICD-10-CM

## 2013-10-09 MED ORDER — MOVIPREP 100 G PO SOLR: 1.0000 | ORAL | Status: DC

## 2013-10-09 NOTE — Progress Notes (Signed)
HPI :  Patient is a 52 year old Spanish speaking female referred by oncology for evaluation of abdominal pain and constipation. Patient is here with an interpreter. Patient gives a several year history of chronic constipation described as hard stool. She has tried Dulcolax and other over-the-counter agents with variable success. Patient complains of associated bloating and generalized abdominal discomfort. Patient has not had any unusual weight loss, she has gained weight. She reports occasional hemorrhoidal bleeding. CBC yesterday was normal with hemoglobin 14.9. Patient has a history of iron deficiency for which she has taken iron for several years. Hematologist has asked her to hold iron, likely because of constipation. Of note, her ferritin is 91, TIBC 379 with 27% saturation. No family history of colon cancer.  Other GI problems include history of GERD. Patient recently evaluated by ENT who prescribed twice daily PPI for one month. Patient's ear problems and tongue discomfort resolved with PPI as did her heartburn. She completed a one-month course of twice daily PPI 2 weeks ago. Since that time she's been taking a PPI only as needed.  Patient had an MRI revealing a lesion around L3/L4 of indeterminate etiology. Oncology is evaluating her for possible multiple myeloma, patient just completed the urine study. She also has a left breast mass and is in the process of workup for that as well    Past  Medical History  Diagnosis Date  . Iron deficiency anemia   . Hypertension   . Arthritis   . Gallstones   . Hemorrhoids, external 10-21-11    occ. bothersome  . Calculus of gallbladder with acute and chronic cholecystitis without obstruction, s/p lap chole 27Dec2012 09/19/2011  . Bone lesion 10/04/2013  . Mammogram abnormal 10/04/2013    Family History  Problem Relation Age of Onset  . Colon cancer Neg Hx   . Hypertension Mother   . Hypertension Father   . Diabetes Mother    History    Substance Use Topics  . Smoking status: Former Smoker -- 0.25 packs/day for 10 years    Quit date: 10/20/2004  . Smokeless tobacco: Never Used  . Alcohol Use: No   Current Outpatient Prescriptions  Medication Sig Dispense Refill  . acetaminophen (TYLENOL) 500 MG tablet Take 1 tablet (500 mg total) by mouth every 6 (six) hours as needed for pain.  30 tablet  0  . Calcium-Magnesium-Vitamin D (CALCIUM 500 PO) Take by mouth.      . docusate sodium (COLACE) 100 MG capsule Take 1 capsule (100 mg total) by mouth 2 (two) times daily as needed for mild constipation.  30 capsule  0  . ferrous sulfate 325 (65 FE) MG tablet Take 325 mg by mouth daily with breakfast.      . gabapentin (NEURONTIN) 100 MG capsule Take 1 capsule (100 mg total) by mouth 2 (two) times daily.  60 capsule  3  . lisinopril (PRINIVIL,ZESTRIL) 10 MG tablet Take 1 tablet (10 mg total) by mouth daily.  30 tablet  3  . meloxicam (MOBIC) 15 MG tablet Take 1 tablet (15 mg total) by mouth daily.  30 tablet  3  . Multiple Vitamin (MULTIVITAMIN) capsule Take 1 capsule by mouth daily.      Marland Kitchen omeprazole (PRILOSEC) 40 MG capsule Take 1 capsule (40 mg total) by mouth daily.  30 capsule  3  . MOVIPREP 100 G SOLR Take 1 kit (200 g total) by mouth as directed.  1 kit  0   No current facility-administered medications  for this visit.   Allergies  Allergen Reactions  . Hydrocodone Nausea And Vomiting  . Oxycodone Rash   Review of Systems: positive for low back pain, fatigue. All other systems reviewed and negative except where noted in HPI.    Physical Exam: BP 118/70  Pulse 66  Ht 5' (1.524 m)  Wt 166 lb (75.297 kg)  BMI 32.42 kg/m2  LMP 08/20/2010 Constitutional: Pleasant,well-developed, hispanic female in no acute distress. HEENT: Normocephalic and atraumatic. Conjunctivae are normal. No scleral icterus. Neck supple.  Cardiovascular: Normal rate, regular rhythm.  Pulmonary/chest: Effort normal and breath sounds normal. No  wheezing, rales or rhonchi. Abdominal: Soft, nondistended, nontender. Bowel sounds active throughout. There are no masses palpable. No hepatomegaly. Extremities: no edema Lymphadenopathy: No cervical adenopathy noted. Neurological: Alert and oriented to person place and time. Skin: Skin is warm and dry. No rashes noted. Psychiatric: Normal mood and affect. Behavior is normal.   ASSESSMENT AND PLAN:  27. 52 year old Spanish speaking female referred for evaluation of constipation and abdominal pain. Her constipation is chronic. Generalized abdominal pain / bloating may be secondary to constipation. Recent labs unremarkable. Will start Miralax and high fiber diet (list of foods in Spanish provided). Patient is of age for screening colonoscopy, she is agreeable to proceeding. The risks, benefits, and alternatives to colonoscopy with possible biopsy and possible polypectomy were discussed with the patient through the interpreter and she consents to proceed. She should undergo a two day bowel prep. Iron has already been placed on hold. If colonoscopy is normal patient will need to be on daily bowel regimen.  2. Low back pain, lumbar lesion. Workup for multiple myeloma in process.   3. Breast mass, workup up in process.     Patient has chronic constipation

## 2013-10-09 NOTE — Patient Instructions (Signed)
You have been scheduled for a colonoscopy with propofol. Please follow written instructions given to you at your visit today.  Please pick up your prep kit at the pharmacy within the next 1-3 days.[facility].  If you use inhalers (even only as needed), please bring them with you on the day of your procedure.

## 2013-10-10 LAB — SPEP & IFE WITH QIG
Albumin ELP: 56.2 % (ref 55.8–66.1)
Alpha-2-Globulin: 8.8 % (ref 7.1–11.8)
Beta 2: 5.2 % (ref 3.2–6.5)
Beta Globulin: 7.6 % — ABNORMAL HIGH (ref 4.7–7.2)
IgA: 243 mg/dL (ref 69–380)
IgM, Serum: 154 mg/dL (ref 52–322)

## 2013-10-10 LAB — UIFE/LIGHT CHAINS/TP QN, 24-HR UR
Free Kappa Lt Chains,Ur: 0.88 mg/dL (ref 0.14–2.42)
Free Lambda Excretion/Day: 2.1 mg/d
Free Lambda Lt Chains,Ur: 0.06 mg/dL (ref 0.02–0.67)
Free Lt Chn Excr Rate: 30.8 mg/d
Time: 24 hours
Volume, Urine: 3500 mL

## 2013-10-10 LAB — UPEP/TP, 24-HR URINE: Total Protein, Urine: <3 mg/dL

## 2013-10-10 LAB — KAPPA/LAMBDA LIGHT CHAINS: Lambda Free Lght Chn: 1.59 mg/dL (ref 0.57–2.63)

## 2013-10-10 LAB — VITAMIN D 25 HYDROXY (VIT D DEFICIENCY, FRACTURES): Vit D, 25-Hydroxy: 31 ng/mL (ref 30–89)

## 2013-10-11 ENCOUNTER — Ambulatory Visit (HOSPITAL_COMMUNITY)
Admission: RE | Admit: 2013-10-11 | Discharge: 2013-10-11 | Disposition: A | Discharge status: Home / Self Care | Payer: No Typology Code available for payment source | Source: Ambulatory Visit | Attending: Hematology and Oncology | Admitting: Hematology and Oncology

## 2013-10-11 DIAGNOSIS — M545 Low back pain, unspecified: Secondary | ICD-10-CM | POA: Insufficient documentation

## 2013-10-11 DIAGNOSIS — C9000 Multiple myeloma not having achieved remission: Secondary | ICD-10-CM | POA: Insufficient documentation

## 2013-10-11 DIAGNOSIS — Z9089 Acquired absence of other organs: Secondary | ICD-10-CM | POA: Insufficient documentation

## 2013-10-11 DIAGNOSIS — M549 Dorsalgia, unspecified: Secondary | ICD-10-CM

## 2013-10-11 DIAGNOSIS — M19019 Primary osteoarthritis, unspecified shoulder: Secondary | ICD-10-CM | POA: Insufficient documentation

## 2013-10-11 DIAGNOSIS — M899 Disorder of bone, unspecified: Secondary | ICD-10-CM

## 2013-10-14 ENCOUNTER — Ambulatory Visit (AMBULATORY_SURGERY_CENTER): Payer: Self-pay | Admitting: Gastroenterology

## 2013-10-14 ENCOUNTER — Encounter: Payer: Self-pay | Admitting: Gastroenterology

## 2013-10-14 VITALS — BP 98/67 | HR 69 | Temp 97.6°F | Resp 15 | Ht 60.0 in | Wt 166.0 lb

## 2013-10-14 DIAGNOSIS — D126 Benign neoplasm of colon, unspecified: Secondary | ICD-10-CM

## 2013-10-14 DIAGNOSIS — Z1211 Encounter for screening for malignant neoplasm of colon: Secondary | ICD-10-CM

## 2013-10-14 DIAGNOSIS — R109 Unspecified abdominal pain: Secondary | ICD-10-CM

## 2013-10-14 MED ORDER — SODIUM CHLORIDE 0.9 % IV SOLN: 500.0000 mL | INTRAVENOUS | Status: DC

## 2013-10-14 NOTE — Op Note (Signed)
[facility] [address] [place], [address]   COLONOSCOPY PROCEDURE REPORT  PATIENT: Susan Lopez, Susan Lopez  MR#: [id number] BIRTHDATE: [birth year] , 52  yrs. old GENDER: Female ENDOSCOPIST: Rachael Fee, MD REFERRED BY: Artis Delay, MD PROCEDURE DATE:  10/14/2013 PROCEDURE:   Colonoscopy, screening First Screening Colonoscopy - Avg.  risk and is 50 yrs.  old or older Yes.  Prior Negative Screening - Now for repeat screening. N/A  History of Adenoma - Now for follow-up colonoscopy & has been > or = to 3 yrs.  N/A  Polyps Removed Today? Yes. ASA CLASS:   Class II INDICATIONS:average risk screening. MEDICATIONS: Fentanyl 75 mcg IV, Versed 8 mg IV, and These medications were titrated to patient response per physician's verbal order  DESCRIPTION OF PROCEDURE:   After the risks benefits and alternatives of the procedure were thoroughly explained, informed consent was obtained.  A digital rectal exam revealed no abnormalities of the rectum.   The LB [id number]  endoscope was introduced through the anus and advanced to the cecum, which was identified by both the appendix and ileocecal valve. No adverse events experienced.   The quality of the prep was excellent.  The instrument was then slowly withdrawn as the colon was fully examined.   COLON FINDINGS: One polyp was found, removed and sent to pathology. This was sessile, 4mm across, located in cecum, removed with cold snare.  The examination was otherwise normal.  Retroflexed views revealed no abnormalities. The time to cecum=1 minutes 34 seconds. Withdrawal time=8 minutes 37 seconds.  The scope was withdrawn and the procedure completed. COMPLICATIONS: There were no complications.  ENDOSCOPIC IMPRESSION: One polyp was found, removed and sent to pathology. The examination was otherwise normal.  RECOMMENDATIONS: If the polyp(s) removed today are proven to be adenomatous (pre-cancerous) polyps, you  Susan Lopez need a repeat colonoscopy in 5 years.  Otherwise you should continue to follow colorectal cancer screening guidelines for "routine risk" patients with colonoscopy in 10 years.  You Susan Lopez receive a letter within 1-2 weeks with the results of your biopsy as well as final recommendations.  Please call my office if you have not received a letter after 3 weeks. You should continue to once daily miralax for your chronic constipation, as recommended in my office last week.  eSigned:  Rachael Fee, MD 10/14/2013 11:09 AM

## 2013-10-14 NOTE — Patient Instructions (Signed)
YOU HAD AN ENDOSCOPIC PROCEDURE TODAY AT THE [facility]: Refer to the procedure report that was given to you for any specific questions about what was found during the examination.  If the procedure report does not answer your questions, please call your gastroenterologist to clarify.  If you requested that your care partner not be given the details of your procedure findings, then the procedure report has been included in a sealed envelope for you to review at your convenience later.  YOU SHOULD EXPECT: Some feelings of bloating in the abdomen. Passage of more gas than usual.  Walking can help get rid of the air that was put into your GI tract during the procedure and reduce the bloating. If you had a lower endoscopy (such as a colonoscopy or flexible sigmoidoscopy) you may notice spotting of blood in your stool or on the toilet paper. If you underwent a bowel prep for your procedure, then you may not have a normal bowel movement for a few days.  DIET: Your first meal following the procedure should be a light meal and then it is ok to progress to your normal diet.  A half-sandwich or bowl of soup is an example of a good first meal.  Heavy or fried foods are harder to digest and may make you feel nauseous or bloated.  Likewise meals heavy in dairy and vegetables can cause extra gas to form and this can also increase the bloating.  Drink plenty of fluids but you should avoid alcoholic beverages for 24 hours.  ACTIVITY: Your care partner should take you home directly after the procedure.  You should plan to take it easy, moving slowly for the rest of the day.  You can resume normal activity the day after the procedure however you should NOT DRIVE or use heavy machinery for 24 hours (because of the sedation medicines used during the test).    SYMPTOMS TO REPORT IMMEDIATELY: A gastroenterologist can be reached at any hour.  During normal business hours, 8:30 AM to 5:00 PM Monday through Friday,  call [phone].  After hours and on weekends, please call the GI answering service at [phone] who will take a message and have the physician on call contact you.   Following lower endoscopy (colonoscopy or flexible sigmoidoscopy):  Excessive amounts of blood in the stool  Significant tenderness or worsening of abdominal pains  Swelling of the abdomen that is new, acute  Fever of 100F or higher  FOLLOW UP: If any biopsies were taken you will be contacted by phone or by letter within the next 1-3 weeks.  Call your gastroenterologist if you have not heard about the biopsies in 3 weeks.  Our staff will call the home number listed on your records the next business day following your procedure to check on you and address any questions or concerns that you may have at that time regarding the information given to you following your procedure. This is a courtesy call and so if there is no answer at the home number and we have not heard from you through the emergency physician on call, we will assume that you have returned to your regular daily activities without incident.  SIGNATURES/CONFIDENTIALITY: You and/or your care partner have signed paperwork which will be entered into your electronic medical record.  These signatures attest to the fact that that the information above on your After Visit Summary has been reviewed and is understood.  Full responsibility of the confidentiality of this   discharge information lies with you and/or your care-partner.   HANDOUT ON POLYPS  Plipos en el colon  (Colon Polyps) Los plipos son masas de tejido que crecen dentro del cuerpo. Los plipos pueden desarrollarse en el intestino grueso (colon). La mayora de los plipos son no cancerosos (benignos). Sin embargo, algunos plipos pueden convertirse en cancerosos con el tiempo. Los plipos que sean ms grandes que un guisante pueden ser [employer]. Para estar seguros, los mdicos extirpan y [employer]  plipos.  CAUSAS  Se forman cuando ciertas mutaciones genticas hacen que las clulas se desarrollen y se dividan por dems.  FACTORES DE RIESGO  Hay un nmero de factores de riesgo que pueden aumentar las probabilidades de padecer plipos en el colon. Ellos son:   Ser mayor de 50 aos de edad.  Historia familiar de cncer o plipos de colon.  Ciertas enfermedades crnicas como la colitis o la enfermedad de Crohn.  Tener sobrepeso.  El hbito de fumar.  El sedentarismo.  Beber alcohol en exceso. SNTOMAS  La mayor parte de los plipos no causa sntomas. Si se presentan sntomas, stos pueden ser:   [employer] materia fecal. Heces de color rojo oscuro o negro.  Constipacin o diarrea que duran ms de 1 semana. DIAGNSTICO  [place] persona con frecuencia no sabe que tiene plipos [employer] su mdico los halla durante un examen fsico de [place]. El mdico puede usar 4 tipos de pruebas para Engineer, manufacturing plipos:   Examen Ecologist. Se colocar guantes y palpar el interior del recto. Esta prueba detectar solo plipos en el recto.  Enema de bario. El mdico introduce un lquido llamado bario en el recto y luego toma radiografas del colon. El bario hace que el colon se vea blanco. Los plipos son de color oscuro, por lo que son fciles de Landscape architect.  Sigmoideoscopia. Se coloca un tubo delgado y flexible (sigmoideoscopio) en el recto. Este sigmoideoscopio tiene [place] fuente de luz y Neomia Dear pequea cmara de video. El mdico [place] el sigmoideoscopio para observar el ltimo tercio del colon.  Colonoscopa. Esta prueba es similar a la sigmoideoscopia, pero el mdico examina todo el colon. Este es el mtodo ms frecuente para Engineer, manufacturing y extirpar los plipos. TRATAMIENTO  Todo plipo ser extirpado Franco Nones sigmoideoscopa o una colonoscopa. Luego se analizan para Clinical cytogeneticist.  PREVENCIN  Para disminuir los riesgos de volver a Warehouse manager plipos en el colon:   Coma mucha  fruta y verdura fresca. Evite las comidas grasas.  No fume.  Evite consumir alcohol.  Haga ejercicios [employer].  Baje de peso segn las indicaciones de su mdico.  Consuma mucho clcio y folato. Las comidas que contienen calcio son la [place], los quesos y el brcoli. Las comidas que contienen folato son los garbanzos, los frijoles rojos y Conservation officer, historic buildings. INSTRUCCIONES PARA EL CUIDADO EN EL HOGAR  Cumpla con todas las visitas de control, segn le indique su mdico. Podr necesitar exmenes peridicos para controlar si vuelven a aparecer.  SOLICITE ATENCIN MDICA SI:  Nota sangrado al mover el intestino.  Document Released: 04/17/2012 [facility] Patient Information 2014 [place], [place].   CONTINUE MIRALAX AS DIRECTED ONCE A DAY FOR CONSTIPATION

## 2013-10-14 NOTE — Progress Notes (Signed)
Patient did not experience any of the following events: a burn prior to discharge; a fall within the facility; wrong site/side/patient/procedure/implant event; or a hospital transfer or hospital admission upon discharge from the facility. (G8907)Patient did not have preoperative order for IV antibiotic SSI prophylaxis. (G8918) EWM 

## 2013-10-14 NOTE — Progress Notes (Signed)
i agree with the plan above 

## 2013-10-15 ENCOUNTER — Telehealth: Payer: Self-pay | Admitting: *Deleted

## 2013-10-15 NOTE — Telephone Encounter (Signed)
  Follow up Call-  Call back number 10/14/2013  Post procedure Call Back phone  # [phone]  Phone comments [phone]     Patient questions:  Do you have a fever, pain , or abdominal swelling? no Pain Score  0 *  Have you tolerated food without any problems? yes  Have you been able to return to your normal activities? yes  Do you have any questions about your discharge instructions: Diet   no Medications  no Follow up visit  no  Do you have questions or concerns about your Care? no  Actions: * If pain score is 4 or above: No action needed, pain <4.

## 2013-10-16 ENCOUNTER — Ambulatory Visit: Payer: No Typology Code available for payment source | Attending: Internal Medicine | Admitting: Internal Medicine

## 2013-10-16 ENCOUNTER — Encounter: Payer: Self-pay | Admitting: Internal Medicine

## 2013-10-16 ENCOUNTER — Ambulatory Visit
Admission: RE | Admit: 2013-10-16 | Discharge: 2013-10-16 | Disposition: A | Discharge status: Home / Self Care | Payer: No Typology Code available for payment source | Source: Ambulatory Visit | Attending: Medical | Admitting: Medical

## 2013-10-16 VITALS — BP 135/81 | HR 76 | Temp 98.7°F | Resp 16 | Ht 60.0 in | Wt 165.0 lb

## 2013-10-16 DIAGNOSIS — R928 Other abnormal and inconclusive findings on diagnostic imaging of breast: Secondary | ICD-10-CM

## 2013-10-16 DIAGNOSIS — I10 Essential (primary) hypertension: Secondary | ICD-10-CM | POA: Insufficient documentation

## 2013-10-16 DIAGNOSIS — R079 Chest pain, unspecified: Secondary | ICD-10-CM

## 2013-10-16 MED ORDER — FERROUS SULFATE 325 (65 FE) MG PO TABS: 325.0000 mg | ORAL_TABLET | Freq: Every day | ORAL | Status: DC

## 2013-10-16 MED ORDER — LISINOPRIL 10 MG PO TABS: 10.0000 mg | ORAL_TABLET | Freq: Every day | ORAL | Status: DC

## 2013-10-16 MED ORDER — MELOXICAM 15 MG PO TABS: 15.0000 mg | ORAL_TABLET | Freq: Every day | ORAL | Status: DC

## 2013-10-16 MED ORDER — GABAPENTIN 100 MG PO CAPS: 100.0000 mg | ORAL_CAPSULE | Freq: Two times a day (BID) | ORAL | Status: DC

## 2013-10-16 MED ORDER — OMEPRAZOLE 40 MG PO CPDR: 40.0000 mg | DELAYED_RELEASE_CAPSULE | Freq: Every day | ORAL | Status: DC

## 2013-10-16 MED ORDER — DOCUSATE SODIUM 100 MG PO CAPS: 100.0000 mg | ORAL_CAPSULE | Freq: Two times a day (BID) | ORAL | Status: DC | PRN

## 2013-10-16 NOTE — Progress Notes (Signed)
Patient ID: Susan Lopez, female   DOB: [birth year], 52 y.o.   MRN: [id number]  CC: follow up  HPI: Patient is 52 year old female with hypertension who presents to clinic for followup he needs refill on her blood pressure medications. She denies chest pain or shortness of breath, no recent sicknesses or hospitalizations. She reports checking blood pressure regularly and the numbers are usually less than 130/80.  Allergies  Allergen Reactions  . Hydrocodone Nausea And Vomiting  . Oxycodone Rash   Past Medical History  Diagnosis Date  . Iron deficiency anemia   . Hypertension   . Arthritis   . Gallstones   . Hemorrhoids, external 10-21-11    occ. bothersome  . Calculus of gallbladder with acute and chronic cholecystitis without obstruction, s/p lap chole [id number] 09/19/2011  . Bone lesion 10/04/2013  . Mammogram abnormal 10/04/2013   Current Outpatient Prescriptions on File Prior to Visit  Medication Sig Dispense Refill  . acetaminophen (TYLENOL) 500 MG tablet Take 1 tablet (500 mg total) by mouth every 6 (six) hours as needed for pain.  30 tablet  0  . Calcium-Magnesium-Vitamin D (CALCIUM 500 PO) Take by mouth.      . Multiple Vitamin (MULTIVITAMIN) capsule Take 1 capsule by mouth daily.       No current facility-administered medications on file prior to visit.   Family History  Problem Relation Age of Onset  . Colon cancer Neg Hx   . Hypertension Mother   . Hypertension Father   . Diabetes Mother    History   Social History  . Marital Status: Single    Spouse Name: N/A    Number of Children: N/A  . Years of Education: N/A   Occupational History  . Not on file.   Social History Main Topics  . Smoking status: Former Smoker -- 0.25 packs/day for 10 years    Quit date: 10/20/2004  . Smokeless tobacco: Never Used  . Alcohol Use: No  . Drug Use: No  . Sexual Activity: Yes    Birth Control/ Protection: Post-menopausal   Other Topics Concern  . Not on file    Social History Narrative  . No narrative on file    Review of Systems  Constitutional: Negative for fever, chills, diaphoresis, activity change, appetite change and fatigue.  HENT: Negative for ear pain, nosebleeds, congestion, facial swelling, rhinorrhea, neck pain, neck stiffness and ear discharge.   Eyes: Negative for pain, discharge, redness, itching and visual disturbance.  Respiratory: Negative for cough, choking, chest tightness, shortness of breath, wheezing and stridor.   Cardiovascular: Negative for chest pain, palpitations and leg swelling.  Gastrointestinal: Negative for abdominal distention.  Genitourinary: Negative for dysuria, urgency, frequency, hematuria, flank pain, decreased urine volume, difficulty urinating and dyspareunia.  Musculoskeletal: Negative for back pain, joint swelling, arthralgias and gait problem.  Neurological: Negative for dizziness, tremors, seizures, syncope, facial asymmetry, speech difficulty, weakness, light-headedness, numbness and headaches.  Hematological: Negative for adenopathy. Does not bruise/bleed easily.  Psychiatric/Behavioral: Negative for hallucinations, behavioral problems, confusion, dysphoric mood, decreased concentration and agitation.    Objective:   Filed Vitals:   10/16/13 1053  BP: 135/81  Pulse: 76  Temp: 98.7 F (37.1 C)  Resp: 16    Physical Exam  Constitutional: Appears well-developed and well-nourished. No distress.  CVS: RRR, S1/S2 +, no murmurs, no gallops, no carotid bruit.  Pulmonary: Effort and breath sounds normal, no stridor, rhonchi, wheezes, rales.  Abdominal: Soft. BS +,  no distension, tenderness, rebound or  guarding.  Musculoskeletal: Normal range of motion. No edema and no tenderness.   Lab Results  Component Value Date   WBC 6.3 10/08/2013   HGB 14.9 10/08/2013   HCT 43.2 10/08/2013   MCV 92.8 10/08/2013   PLT 245 10/08/2013   Lab Results  Component Value Date   CREATININE 0.7 10/08/2013   BUN  10.4 10/08/2013   NA 142 10/08/2013   K 3.9 10/08/2013   CL 106 06/10/2013   CO2 23 10/08/2013    Lab Results  Component Value Date   HGBA1C 5.4 06/10/2013   Lipid Panel  No results found for this basename: chol, trig, hdl, cholhdl, vldl, ldlcalc       Assessment and plan:   Patient Active Problem List   Diagnosis Date Noted  . Mammogram abnormal - results not back yet, we'll continue to followup  10/04/2013  . Unspecified essential hypertension - appears to be well controlled. We'll provide refills on blood pressure medicine. I advised patient to check blood pressure regularly and to call his back in the numbers are persistently higher than 140/90 for that we can readjust her regimen as indicated.  06/10/2013

## 2013-10-16 NOTE — Progress Notes (Signed)
PT IS HERE FOLLOWING UP ON HER HTN. PT IS REQUESTING REFILLS ON HER MEDICATION.

## 2013-10-16 NOTE — Patient Instructions (Signed)
Hipertensión  (Hypertension)  Cuando el corazón late bombea la sangre a través de las arterias. La fuerza que se origina es la presión arterial. Si la presión es demasiado elevada, se denomina hipertensión. El peligro radica en que puede sufrirla y no saberlo. Hipertensión puede significar que su corazón debe trabajar más intensamente para bombear sangre. Las arterias pueden estar estrechas o rígidas. El trabajo extra aumenta el riesgo de enfermedades cardíacas, ictus y otros problemas.   La presión arterial está formada por dos números: el número mayor sobre el número menor, por ejemplo110/70. Se señala "110/72". Los valores ideales son por debajo de 120 para el número más alto (sistólica) y por debajo de 80 para el más bajo (diastólica). Anote su presión sanguínea hoy.  Debe prestar mucha atención a su presión arterial si sufre alguna otra enfermedad como:  · Insuficiencia cardíaca  · Ataques cardiacos previos  · Diabetes  · Enfermedad renal crónica  · Ictus previo  · Múltiples factores de riesgo para enfermedades cardíacas  Para diagnosticar si usted sufre hipertensión arterial, debe medirse la presión mientras encuentra sentado con el brazo elevado a la altura del nivel del corazón. Debe medirse al menos 2 veces. Una única lectura de presión arterial elevada (especialmente en el servicio de emergencias) no significa que necesita tratamiento. Hay enfermedades en las que la presión arterial es diferente en ambos brazos. Es importante que consulte rápidamente con su médico para un control.  La mayoría de las personas sufren hipertensión esencial, lo que significa que no tiene una causa específica. Este tipo de hipertensión puede bajarse modificando algunos factores en el estilo de vida como:  · Estrés.  · El consumo de cigarrillos.  · La falta de actividad física.  · Peso excesivo  · Consumo de drogas y alcohol.  · Consumiendo menos sal  La mayoría de las personas no tienen síntomas hasta que la hipertensión  ocasiona un daño en el organismo. El tratamiento efectivo puede evitar, demorar o reducir ese daño.  TRATAMIENTO:  El tratamiento para la hipertensión, cuando se ha identificado una causa, está dirigido a la misma Hay un gran número en medicamentos para tratarla. Se agrupan en diferentes categorías y el médico seleccionará los medicamentos indicados para usted. Muchos medicamentos disponibles tienen efectos secundarios. Debe comentar los efectos secundarios con su médico.  Si la presión arterial permanece elevada después de modificar su estilo de vida o comenzar a tomar medicamentos:  · Los medicamentos deben ser reemplazados  · Puede ser necesario evaluar otros problemas.  · Debe estar seguro que comprende las indicaciones, que sabe cómo y cuándo tomar los medicamentos.  · Asegúrese de realizar un control con su médico dentro del tiempo indicado (generalmente dentro de las dos semanas) para volver a evaluar la presión arterial y revisar los medicamentos prescritos.  · Si está tomando más de un medicamento para la presión arterial, asegúrese que sabe cómo y en qué momentos debe tomarlos. Tomar los medicamentos al mismo tiempo puede dar como resultado un gran descenso en la presión arterial.  SOLICITE ATENCIÓN MÉDICA DE INMEDIATO SI PRESENTA:  · Dolor de cabeza intenso, visión borrosa o cambios en la visión, o confusión.  · Debilidad o adormecimientos inusuales o sensación de desmayo.  · Dolor de pecho o abdominal intenso, vómitos o problemas para respirar.  ASEGÚRESE QUE:   · Comprende estas instrucciones.  · Controlará su enfermedad.  · Solicitará ayuda inmediatamente si no mejora o si empeora.  Document Released: 10/17/2005 Document Revised: 01/09/2012  [employer]®   Patient Information ©2014 [employer].

## 2013-10-17 ENCOUNTER — Ambulatory Visit (HOSPITAL_BASED_OUTPATIENT_CLINIC_OR_DEPARTMENT_OTHER): Payer: No Typology Code available for payment source | Admitting: Hematology and Oncology

## 2013-10-17 ENCOUNTER — Encounter: Payer: Self-pay | Admitting: Hematology and Oncology

## 2013-10-17 VITALS — BP 121/75 | HR 79 | Temp 97.6°F | Resp 18 | Ht 60.0 in | Wt 164.4 lb

## 2013-10-17 DIAGNOSIS — M899 Disorder of bone, unspecified: Secondary | ICD-10-CM

## 2013-10-17 DIAGNOSIS — R928 Other abnormal and inconclusive findings on diagnostic imaging of breast: Secondary | ICD-10-CM

## 2013-10-17 DIAGNOSIS — Z862 Personal history of diseases of the blood and blood-forming organs and certain disorders involving the immune mechanism: Secondary | ICD-10-CM

## 2013-10-17 NOTE — Progress Notes (Signed)
[facility] OFFICE PROGRESS NOTE  JEGEDE, OLUGBEMIGA, MD DIAGNOSIS:  History of bone lesion, abnormal mammogram and history of anemia  SUMMARY OF HEMATOLOGIC HISTORY: This patient was referred here because of bone pain, questionable bone lesion and anemia, to exclude diagnosis of multiple myeloma. Workup was negative for multiple myeloma. 4 anemia, colonoscopy was done which showed no evidence of malignancy. She was subsequently found to have abnormal mammogram, workup pending INTERVAL HISTORY: Susan Lopez 52 y.o. female returns for further followup. She is concerned about diagnosis of breast cancer. She have occasional breast tenderness on the left side When I saw her last, I told her to stop iron supplements. Her constipation is improving with laxatives. I have reviewed the past medical history, past surgical history, social history and family history with the patient and they are unchanged from previous note.  ALLERGIES:  is allergic to hydrocodone and oxycodone.  MEDICATIONS:  Current Outpatient Prescriptions  Medication Sig Dispense Refill  . acetaminophen (TYLENOL) 500 MG tablet Take 1 tablet (500 mg total) by mouth every 6 (six) hours as needed for pain.  30 tablet  0  . Calcium-Magnesium-Vitamin D (CALCIUM 500 PO) Take by mouth.      . docusate sodium (COLACE) 100 MG capsule Take 1 capsule (100 mg total) by mouth 2 (two) times daily as needed for mild constipation.  30 capsule  3  . ferrous sulfate 325 (65 FE) MG tablet Take 1 tablet (325 mg total) by mouth daily with breakfast.  30 tablet  5  . gabapentin (NEURONTIN) 100 MG capsule Take 1 capsule (100 mg total) by mouth 2 (two) times daily.  60 capsule  3  . lisinopril (PRINIVIL,ZESTRIL) 10 MG tablet Take 1 tablet (10 mg total) by mouth daily.  30 tablet  3  . meloxicam (MOBIC) 15 MG tablet Take 1 tablet (15 mg total) by mouth daily.  30 tablet  3  . Multiple Vitamin (MULTIVITAMIN) capsule Take 1 capsule  by mouth daily.      Marland Kitchen omeprazole (PRILOSEC) 40 MG capsule Take 1 capsule (40 mg total) by mouth daily.  30 capsule  3   No current facility-administered medications for this visit.     REVIEW OF SYSTEMS:   Constitutional: Denies fevers, chills or night sweats All other systems were reviewed with the patient and are negative.  PHYSICAL EXAMINATION: ECOG PERFORMANCE STATUS: 0 - Asymptomatic  Filed Vitals:   10/17/13 1325  BP: 121/75  Pulse: 79  Temp: 97.6 F (36.4 C)  Resp: 18   Filed Weights   10/17/13 1325  Weight: 164 lb 6.4 oz (74.571 kg)    GENERAL:alert, no distress and comfortable NEURO: alert & oriented x 3 with fluent speech, no focal motor/sensory deficits  LABORATORY DATA:  I have reviewed the data as listed No results found for this or any previous visit (from the past 48 hour(s)).  Lab Results  Component Value Date   WBC 6.3 10/08/2013   HGB 14.9 10/08/2013   HCT 43.2 10/08/2013   MCV 92.8 10/08/2013   PLT 245 10/08/2013    RADIOGRAPHIC STUDIES: I have personally reviewed the radiological images as listed and agreed with the findings in the report. Mm Digital Diagnostic Unilat L  10/16/2013   CLINICAL DATA:  Screening callback for 2 left breast masses and 1 area of calcifications in the central left breast 12 o'clock location.  EXAM: DIGITAL DIAGNOSTIC  left MAMMOGRAM  COMPARISON:  Prior exams 09/13/2013, 05/26/2010  ACR Breast Density  Category b: There are scattered areas of fibroglandular density.  FINDINGS: Additional views re- demonstrate nodular breast parenchyma with multiple oval and round circumscribed masses in the central left breast corresponding to those seen on the 2011 exam and without significant interval change or suspicious feature.  In the left breast central 12 o'clock location, there is a 5 mm cluster of round calcifications which is new since the prior study and corresponds to the finding at recent screening mammography.  IMPRESSION: Suspicious  left central/ 12 o'clock location 5 mm cluster of round calcifications. Given their development since the prior exam, stereotactic core needle biopsy is recommended and will be scheduled at the patient's convenience. She does not have insurance and we will arrange for referral to the BCCCP program.  RECOMMENDATION: Left stereotactic core needle biopsy  I have discussed the findings and recommendations with the patient. Results were also provided in writing at the conclusion of the visit. If applicable, a reminder letter will be sent to the patient regarding the next appointment.  BI-RADS CATEGORY  4: Suspicious abnormality - biopsy should be considered.   Electronically Signed   By: Christiana Pellant M.D.   On: 10/16/2013 10:23    ASSESSMENT & PLAN:  #1 history of bone pain #2 history of anemia Her anemia has resolved. Her bone pain is probably degenerative in nature. She was referred here to exclude bone disease such as multiple myeloma and it was excluded. Her iron supplement was discontinued #3 history of constipation and anemia Colonoscopy was negative. The patient is reassured #4 abnormal breast imaging Workup is in progress. I am going to defer to the breast Center for further workup. All questions were answered. The patient knows to call the clinic with any problems, questions or concerns. No barriers to learning was detected.  I spent 15 minutes counseling the patient face to face. The total time spent in the appointment was 20 minutes and more than 50% was on counseling.     Wana Mount, MD 10/17/2013 1:54 PM

## 2013-10-18 ENCOUNTER — Encounter: Payer: Self-pay | Admitting: Gastroenterology

## 2013-10-21 ENCOUNTER — Other Ambulatory Visit: Payer: Self-pay | Admitting: Obstetrics and Gynecology

## 2013-10-21 DIAGNOSIS — R921 Mammographic calcification found on diagnostic imaging of breast: Secondary | ICD-10-CM

## 2013-10-22 ENCOUNTER — Ambulatory Visit: Payer: No Typology Code available for payment source | Attending: Internal Medicine

## 2013-10-22 ENCOUNTER — Encounter (HOSPITAL_COMMUNITY): Payer: Self-pay

## 2013-10-22 ENCOUNTER — Ambulatory Visit (HOSPITAL_COMMUNITY)
Admission: RE | Admit: 2013-10-22 | Discharge: 2013-10-22 | Disposition: A | Discharge status: Home / Self Care | Payer: No Typology Code available for payment source | Source: Ambulatory Visit | Attending: Obstetrics and Gynecology | Admitting: Obstetrics and Gynecology

## 2013-10-22 VITALS — BP 104/62 | Temp 98.1°F | Ht 60.0 in | Wt 164.2 lb

## 2013-10-22 DIAGNOSIS — N6325 Unspecified lump in the left breast, overlapping quadrants: Secondary | ICD-10-CM | POA: Insufficient documentation

## 2013-10-22 DIAGNOSIS — Z1239 Encounter for other screening for malignant neoplasm of breast: Secondary | ICD-10-CM

## 2013-10-22 NOTE — Progress Notes (Signed)
Patient referred to [facility] by the [facility] due to recommending a left breast biopsy. Screening mammogram completed 09/13/2013 at [facility]. Left breast diagnostic mammogram completed 10/16/2013 at the [facility]. Patient complained of a left breast lump x 10 years that is painful. Patient rated the pain at a 6-7 out of 10.  Pap Smear:  Pap smear not completed today. Last Pap smear was 08/22/2013 at the [facility] and normal. Per patient has no history of an abnormal Pap smear. Last Pap smear result is in EPIC.  Physical exam: Breasts Breasts symmetrical. No skin abnormalities bilateral breasts. No nipple retraction bilateral breasts. No nipple discharge bilateral breasts. No lymphadenopathy. No lumps palpated right breast. Palpated a small lump within the left breast at 12 o'clock. Patient complained of some tenderness when palpated left upper breast. Referred patient to the [facility] for left breast biopsy per recommendation. Appointment scheduled for Friday, October 25, 2013 at 0945.   Pelvic/Bimanual No Pap smear completed today since last Pap smear was 08/22/2013. Pap smear not indicated per BCCCP guidelines.

## 2013-10-22 NOTE — Patient Instructions (Signed)
Taught [name] how to perform BSE and gave educational materials to take home. Patient did not need a Pap smear today due to last Pap smear was 08/22/2013. Let her know BCCCP will cover Pap smears every 3 years unless has a history of abnormal Pap smears. Referred patient to the [facility] for left breast biopsy per recommendation. Appointment scheduled for Friday, October 25, 2013 at 0945. Patient aware of appointment and will be there.  Susan Lopez verbalized understanding.  Nick Armel, Kathaleen Maser, RN 3:15 PM

## 2013-10-25 ENCOUNTER — Ambulatory Visit
Admission: RE | Admit: 2013-10-25 | Discharge: 2013-10-25 | Disposition: A | Discharge status: Home / Self Care | Payer: No Typology Code available for payment source | Source: Ambulatory Visit | Attending: Obstetrics and Gynecology | Admitting: Obstetrics and Gynecology

## 2013-10-25 ENCOUNTER — Other Ambulatory Visit: Payer: Self-pay | Admitting: Obstetrics and Gynecology

## 2013-10-25 DIAGNOSIS — R921 Mammographic calcification found on diagnostic imaging of breast: Secondary | ICD-10-CM

## 2013-10-28 ENCOUNTER — Ambulatory Visit
Admission: RE | Admit: 2013-10-28 | Discharge: 2013-10-28 | Disposition: A | Discharge status: Home / Self Care | Payer: No Typology Code available for payment source | Source: Ambulatory Visit | Attending: Obstetrics and Gynecology | Admitting: Obstetrics and Gynecology

## 2013-10-28 DIAGNOSIS — R921 Mammographic calcification found on diagnostic imaging of breast: Secondary | ICD-10-CM

## 2013-10-31 HISTORY — PX: MM BREAST STEREO BIOPSY LEFT (ARMC HX): HXRAD1824

## 2013-11-11 ENCOUNTER — Other Ambulatory Visit (HOSPITAL_COMMUNITY): Payer: Self-pay | Admitting: *Deleted

## 2013-11-11 DIAGNOSIS — N6320 Unspecified lump in the left breast, unspecified quadrant: Secondary | ICD-10-CM

## 2013-11-12 ENCOUNTER — Ambulatory Visit (HOSPITAL_COMMUNITY)
Admission: RE | Admit: 2013-11-12 | Discharge: 2013-11-12 | Disposition: A | Discharge status: Home / Self Care | Payer: No Typology Code available for payment source | Source: Ambulatory Visit | Attending: Obstetrics and Gynecology | Admitting: Obstetrics and Gynecology

## 2013-11-12 ENCOUNTER — Encounter (HOSPITAL_COMMUNITY): Payer: Self-pay

## 2013-11-12 VITALS — BP 114/60 | Temp 98.2°F | Ht 60.0 in | Wt 168.2 lb

## 2013-11-12 DIAGNOSIS — Z1239 Encounter for other screening for malignant neoplasm of breast: Secondary | ICD-10-CM

## 2013-11-12 NOTE — Patient Instructions (Signed)
Referred patient to the [facility] for follow up of possible hematoma vs new lump at biopsy site. Appointment scheduled for Tuesday, November 19, 2013 at 1030. Patient aware of appointment and will be there. Susan Lopez verbalized understanding.  Ricci Dirocco, Arvil Chaco, RN 4:10 PM

## 2013-11-12 NOTE — Progress Notes (Signed)
Complaints of lump that has increased in size within left breast.  Physical exam: Breasts Breasts symmetrical. No skin abnormalities right breast. Bruising observed on left upper breast around biopsy site. No nipple retraction bilateral breasts. No nipple discharge bilateral breasts. No lymphadenopathy. No lumps palpated right breast. Palpated a hematoma vs lump at 12 o'clock left breast at biopsy site. Area of concern within left breast feels like a possible hematoma from biopsy. Patient stated area became bruised and noticed the lump a few days after biopsy was completed on 10/16/2013 stating the lump got bigger at first. Patient stated that it has been the same size over the past couple weeks. Patient stated she has tried warm/cold compresses with some relief. Complaints of tenderness when palpated left upper breast. Referred patient to the [facility] for follow up of possible hematoma vs new lump at biopsy site. Appointment scheduled for Tuesday, November 19, 2013 at 1030.

## 2013-11-19 ENCOUNTER — Ambulatory Visit
Admission: RE | Admit: 2013-11-19 | Discharge: 2013-11-19 | Disposition: A | Discharge status: Home / Self Care | Payer: No Typology Code available for payment source | Source: Ambulatory Visit | Attending: Obstetrics and Gynecology | Admitting: Obstetrics and Gynecology

## 2013-11-19 DIAGNOSIS — N6320 Unspecified lump in the left breast, unspecified quadrant: Secondary | ICD-10-CM

## 2013-11-22 ENCOUNTER — Other Ambulatory Visit: Payer: No Typology Code available for payment source

## 2013-11-22 ENCOUNTER — Ambulatory Visit: Payer: Self-pay

## 2013-12-05 ENCOUNTER — Other Ambulatory Visit (HOSPITAL_COMMUNITY): Payer: Self-pay | Admitting: Obstetrics and Gynecology

## 2013-12-20 ENCOUNTER — Ambulatory Visit (HOSPITAL_BASED_OUTPATIENT_CLINIC_OR_DEPARTMENT_OTHER): Payer: No Typology Code available for payment source

## 2013-12-20 ENCOUNTER — Other Ambulatory Visit: Payer: No Typology Code available for payment source

## 2013-12-20 VITALS — BP 124/98 | HR 70 | Temp 98.5°F | Resp 16 | Ht 58.5 in | Wt 166.6 lb

## 2013-12-20 DIAGNOSIS — Z0000 Encounter for general adult medical examination without abnormal findings: Secondary | ICD-10-CM

## 2013-12-20 LAB — LIPID PANEL
CHOL/HDL RATIO: 4.7 ratio
CHOLESTEROL: 231 mg/dL — AB (ref 0–200)
HDL: 49 mg/dL (ref >39)
LDL Cholesterol: 118 mg/dL — ABNORMAL HIGH (ref 0–99)
TRIGLYCERIDES: 321 mg/dL — AB (ref <150)
VLDL: 64 mg/dL — ABNORMAL HIGH (ref 0–40)

## 2013-12-20 LAB — HEMOGLOBIN A1C
Hgb A1c MFr Bld: 5.6 % (ref <5.7)
MEAN PLASMA GLUCOSE: 114 mg/dL (ref <117)

## 2013-12-20 LAB — GLUCOSE (CC13): Glucose: 100 mg/dl (ref 70–140)

## 2013-12-20 NOTE — Patient Instructions (Signed)
Discussed health assessment with patient. Patient voiced that she would like to be followed up for depression/anxiety and nutrition. She will be called with results of lab work. Plan on working on bus passes for transportation. Patient verbalized understanding.

## 2013-12-20 NOTE — Progress Notes (Signed)
Patient is a new patient to the [facility] program and is currently a BCCCP patient effective 10/22/2013. She speaks spanish and has an interpreter present with her.  Clinical Measurements: Patient is 4 ft. 10.5 inches, weight 166.6 lbs, waist circumference 41 inches, and hip circumference 38.5 inches.   Medical History: Patient states she does not have a history of high cholesterol and doesn't take any medications. Patient does have a history of hypertension. She is presently on Lisinopril 10 mg and takes it every evening. Per patient has  no history of  diabetes.Per patient no diagnosed history of coronary heart disease, heart attack, heart failure, stroke/TIA, vascular disease or congenital heart defects.  Blood Pressure, Self-measurement: Patient states that she measures her blood pressure with an automatic BP cuff a few times a week. She does take her results to Doctors office.  Nutrition Assessment: Patient stated that eats about two cups of fruit a day in a smoothie with vegetables. Patient states eats 1 to 2 cups of vegetables a day. Patient states does not eat 3 or more ounces of whole grains daily. Patient only eats one serving of fish weekly. Patient states she does not drink more than 36 ounces or 450 calories of beverages with added sugars weekly. Patient stated she does not watch her salt intake.   Physical Activity Assessment: Patient stated she does around 413 minutes of moderate exercise weekly including walking and household chores. Patient stated she does not do any vigorous physical activity.  Smoking Status: Patient states she did smoke but quit around seven years ago. Per patient is not around others that smoke in closed area.  Quality of Life Assessment: In assessing patient's quality of life she stated that out of the past 30 days that she has felt her health is not good all 30 of them. Patient also stated that in the past 30 days that her mental health has not  been good for  30 days including stress, depression and problems with emotions. The patient started crying because of grandson asking for his dad. Patients daughter and husband are separated. Patient did state that out of the past 30 days that 0 of them she felt her physical or mental health kept her from doing her usual activities including self-care, work or recreation. Patient feels that she is depressed and has been for long time. Voiced helplessness in that had to rely on others for transportation.  Plan: Lab work will be done today including a lipid panel, glucose and Hgb A1C. Patient will be referred to [facility] for follow up if has any abnormal lab work, We discussed the possibility of her going to [facility] for counseling and possible depression/anxiety medication control. Will call lab results on Wednesday. Patient voiced interest in nutrition assistance.

## 2013-12-25 ENCOUNTER — Telehealth: Payer: Self-pay

## 2013-12-25 NOTE — Telephone Encounter (Signed)
Interrupter called to inform patient of high lipid panel, health Coaching appointment and no appointment/information on counseling.  PLAN: Will call on counseling, doctors appointment March 17th, and Health Coaching on 01/16/13 at 10am.

## 2013-12-31 ENCOUNTER — Telehealth: Payer: Self-pay

## 2013-12-31 NOTE — Telephone Encounter (Signed)
Patient wanted a mental health appointment for Counseling and possible medication. Patient also had voiced that grandson may need assistance. Called Andreas to refer for counseling.  PLAN: Conan Bowens will call patient to set up intake appointment. Will follow up with patient and set up another Health Coaching Appointment in 1-2 weeks.

## 2014-01-14 ENCOUNTER — Encounter: Payer: Self-pay | Admitting: Internal Medicine

## 2014-01-14 ENCOUNTER — Ambulatory Visit: Payer: No Typology Code available for payment source | Attending: Internal Medicine | Admitting: Internal Medicine

## 2014-01-14 VITALS — BP 131/80 | HR 84 | Temp 98.2°F | Resp 17 | Wt 166.6 lb

## 2014-01-14 DIAGNOSIS — D509 Iron deficiency anemia, unspecified: Secondary | ICD-10-CM | POA: Insufficient documentation

## 2014-01-14 DIAGNOSIS — I10 Essential (primary) hypertension: Secondary | ICD-10-CM | POA: Insufficient documentation

## 2014-01-14 DIAGNOSIS — R079 Chest pain, unspecified: Secondary | ICD-10-CM | POA: Insufficient documentation

## 2014-01-14 DIAGNOSIS — Z8249 Family history of ischemic heart disease and other diseases of the circulatory system: Secondary | ICD-10-CM | POA: Insufficient documentation

## 2014-01-14 DIAGNOSIS — Z833 Family history of diabetes mellitus: Secondary | ICD-10-CM | POA: Insufficient documentation

## 2014-01-14 DIAGNOSIS — R0609 Other forms of dyspnea: Secondary | ICD-10-CM | POA: Insufficient documentation

## 2014-01-14 DIAGNOSIS — E785 Hyperlipidemia, unspecified: Secondary | ICD-10-CM | POA: Insufficient documentation

## 2014-01-14 DIAGNOSIS — R0602 Shortness of breath: Secondary | ICD-10-CM | POA: Insufficient documentation

## 2014-01-14 DIAGNOSIS — Z79899 Other long term (current) drug therapy: Secondary | ICD-10-CM | POA: Insufficient documentation

## 2014-01-14 DIAGNOSIS — Z87891 Personal history of nicotine dependence: Secondary | ICD-10-CM | POA: Insufficient documentation

## 2014-01-14 DIAGNOSIS — K649 Unspecified hemorrhoids: Secondary | ICD-10-CM | POA: Insufficient documentation

## 2014-01-14 DIAGNOSIS — R0989 Other specified symptoms and signs involving the circulatory and respiratory systems: Secondary | ICD-10-CM | POA: Insufficient documentation

## 2014-01-14 DIAGNOSIS — Z006 Encounter for examination for normal comparison and control in clinical research program: Secondary | ICD-10-CM | POA: Insufficient documentation

## 2014-01-14 MED ORDER — ATORVASTATIN CALCIUM 20 MG PO TABS: 20.0000 mg | ORAL_TABLET | Freq: Every day | ORAL | Status: DC

## 2014-01-14 NOTE — Patient Instructions (Signed)
La dieta DASH  (DASH Diet) La dieta DASH (por su nombre en ingls) significa "Abordaje diettico para detener la hipertensin". Es un plan de alimentacin saludable que ha demostrado reducir la presin arterial elevada (hipertensin) en tan solo [address] mientras probablemente tambin ofrezca otros beneficios significativos para la salud. Estos otros beneficios incluyen la reduccin del riesgo de sufrir cncer de mama despus de la menopausia y de sufrir diabetes tipo 2, enfermedades cardacas, cncer de colon e ictus. Los beneficios para la salud tambin incluyen la prdida de peso y la disminucin del riesgo de insuficiencia renal en pacientes con enfermedad renal crnica.  GUAS PARA LA DIETA   Limite la cantidad de sal (sodio). Su dieta debe contener menos de 1500 mg de [employer].  Limite el consumo de carbohidratos refinados o procesados. Su dieta debe incluir principalmente granos enteros. Consuma postres con moderacin y limite el agregado de Location manager.  Incluya pequeas cantidades de grasas saludables para el corazn. Estos tipos de grasas estn contenidas en las nueces, aceites y [place] y trans. Estas grasas han demostrado ser perjudiciales para el organismo. ELECCIN DE LOS ALIMENTOS  Los siguientes grupos de alimentos se basan en una dieta de 2000 caloras. Consulte a su nutricionista matriculado cules son sus necesidades calricas individuales.  Granos y productos elaborados con granos (6 a 8 porciones por Training and development officer).   Consuma ms a menudo: Pan integral, arroz integral, pasta de trigo o de [place] integral, quinoa, palomitas de maz sin grasa o sal aadida (infladas).  Consuma con menos frecuencia:  Pan blanco, pastas blancas, arroz blanco, pan de maz. Verduras (4 a 5 porciones por Training and development officer).   Consuma ms a menudo: Hortalizas frescas, congeladas y IT sales professional. Puede consumir las hortalizas crudas, al vapor, asadas o grilladas con una mnima cantidad de  grasas.  Consuma con menos frecuencia o evite: Vegetales con crema o fritos. Verduras en [place]. Frutas (4 a 5 porciones por da).  Consuma ms a menudo: Frutas frescas, en conserva (en su jugo natural) o frutas congeladas. Frutas secas sin el agregado de azcar. Cien por ciento jugo de fruta ( taza [237 ml] por da).  Consuma con menos frecuencia: Frutas secas con azcar agregado. Fruta enlatada en almbar light o espeso. Carnes magras, pescado y aves de corral (2 porciones o Programmer, multimedia. Una porcin es de 3 a 4 oz. [id number] g]).  Consuma ms a menudo: Carne molida 90% magra o ms de lomo o solomillo . Cortes redondos de carne, [place] de pollo y de [place]. Todos los pescados. Prepare la carne al horno o asada a la parrilla o al grill. Nada debe ser frito.  Consuma con menos frecuencia o evite: Cortes grasos de carne, [place] o pata, muslo o ala de pollo. Cortes de carne o pescado fritos. Lcteos (2 a 3 porciones)  Consuma ms a menudo: La [employer] o con bajo contenido de grasa, yogur descremado o semidescremado, queso con bajo contenido en grasa o parcialmente descremado.  Consuma con menos frecuencia o evite: Leche (entera, al 2%). [place] Quesos con toda su grasa. Nueces, semillas y legumbres (4 a 5 porciones por semana).  Consuma ms a menudo: Todos los alimentos sin sal agregada.  Consuma con menos frecuencia o evite: Nueces y semillas con sal, frijoles enlatados con sal agregada. Grasas y dulces (limitados)  Consuma ms a menudo: Art gallery manager, margarinas en pote sin grasas trans, gelatina sin azcar. Mayonesa y aderezos para Associate Professor.  Consuma  con menos frecuencia o evite: Aceites de coco, aceites de palma, mantequilla, margarina en barra, crema, [employer] y mitad crema, [place], [place], [place]. Marthenia Rolling MS INFORMACIN  El Dash Diet Eating Plan (Plan Nutricional Dieta Dash): www.dashdiet.org  Document Released: 10/06/2011 Document  Revised: 01/09/2012 [facility] Patient Information 2014 [place], [place]. Dieta para el control del colesterol y las grasas  (Fat and Cholesterol Control Diet) Los niveles de grasa y colesterol en la sangre y en los rganos se ven influidos por la dieta. Los [employer] de grasa y Research officer, trade union pueden conducir a enfermedades del Training and development officer, de los pequeos y los grandes vasos sanguneos, de la vescula biliar, el hgado y el pncreas.  CONTROL DE LA GRASA Y EL COLESTEROL CON LA DIETA  Aunque el ejercicio y el estilo de vida son factores importantes, su dieta es la clave. Esto se debe a que se sabe que ciertos alimentos hacen subir el colesterol y otros lo [place]. El objetivo debe ser [employer] alimentos, de modo que tengan un efecto sobre el colesterol y, an ms importante, Training and development officer las grasas saturadas y trans con otros tipos de grasas, como las monoinsaturadas y las poliinsaturadas y cidos grasos omega-3.  En promedio, una persona no debe consumir ms de 15 a 17 g de grasas saturadas por [employer]. Las grasas saturadas y trans se consideran grasas "malas", ya que elevan el colesterol LDL. Las grasas saturadas se encuentran principalmente en productos animales como carne, [place] y crema. Sin embargo, eso no significa que tenga que renunciar a todas sus comidas favoritas. Actualmente, hay buenos sustitutos bajos en colesterol, bajos en grasas para la [employer] de las cosas que le gusta comer. Elija aquellos alimentos alternativos que sean bajos en grasas o sin grasas. Elija cortes de peceto o lomo de carne roja. Estos tipos de cortes contienen menos grasa y colesterol. Pollo (sin la piel), pescado, ternera y [place] de [place] molida son excelentes opciones. Eliminar las carnes grasas, como las salchichas y [place]. Los mariscos contienen poca o casi nada de grasas saturadas. Consuma una porcin de 3 oz (85 g) de carne magra, aves o pescado.  Las grasas trans tambin se llaman "aceites parcialmente hidrogenados".  Son aceites manipulados cientficamente de [place] que son slidos a Engineer, water, tienen una larga vida y Therapist, art sabor y la textura de los alimentos a los que se Pension scheme manager. Las grasas trans se encuentran en la [place], [place], crackers y alimentos horneados.  Al hornear y Audiological scientist, el aceite es un buen sustituto de la [place]. Los aceites monoinsaturados son beneficiosos, sobre todo porque se cree que reducen el colesterol LDL y aumentan el HDL. Los aceites que hay que evitar completamente son los aceites tropicales saturados, como el de coco y palma.  Recuerde consumir una gran cantidad de alimentos de los grupos que estn naturalmente libres de grasas saturadas y grasas trans, e incluya pescado, frutas, verduras, frijoles, granos (cebada, arroz, cuscs, trigo bulgur) y pastas (sin salsas de crema).  IDENTIFICACIN DE LOS ALIMENTOS QUE REDUCEN LAS GRASAS Y ELCOLESTEROL  La fibra soluble puede reducir el colesterol. Este tipo de [place] se Firefighter en [place], verduras como el [place], papas y [place], las legumbres [employer] frijoles, guisantes y Engineer, technical sales y granos como la cebada. Los alimentos enriquecidos con Dentist (fitosteroles) tambin pueden reducir Freight forwarder. Consuma al menos 2 g por da de estos alimentos para un efecto de disminucin del colesterol.  Lea las etiquetas de los paquetes para identificar los alimentos bajos  en grasas saturadas, en grasas trans y los bajos en grasas en el supermercado. Seleccione los Constellation Energy tienen slo 2 a 3 g de grasa saturada por onza. Utilice margarina saludable para el corazn que sea [place] de grasas trans o aceites parcialmente hidrogenados. Al comprar productos de panadera (galletas, crackers), se deben evitar los aceites parcialmente hidrogenados. Panes y panecillos deben hacerse con cereales integrales (trigo integral o harina de avena integral en lugar de " harina " o " harina enriquecida ") Compre sopas en lata que  no sean cremosas, con bajo contenido de sal y sin grasas adicionadas.  TCNICAS DE PREPARACIN DE LOS ALIMENTOS  Nunca prepare los alimentos fritos. Si usted debe frer, Tesoro Corporation en muy poca grasa o use un aerosol de cocina anti adherente. Siempre que sea posible, debe hervir, hornear o asar las carnes y preparar las verduras al vapor. En lugar de agregar mantequilla o margarina a las verduras, use limn y hierbas, pur de [place] y canela (para la calabaza y la batata). Utilice yogur natural sin grasa, salsas y aderezos bajos en grasa para ensaladas.  BAJO EN GRASAS SATURADAS / SUSTITUTOS BAJOS EN GRASA  Carnes / grasas saturadas (g)  Evite: Bife, veteado (3 oz/85 g) / 11 g  Elija: Bife, magro(3 oz/85 g) / 4 g  Evite: [place] (3 oz/85 g) / 7 g  Elija: [place], magra (3 oz/85 g) / 5 g  Evite: Jamn (3 oz/85 g) / 6 g  Elija: Jamn, corte magro (3 oz/85 g) / 2,4 g  Evite: Pollo con piel, carne oscura (3 oz/85 g) / 4 g  Elija: Pollo, sin piel, carne oscura (3 oz/85 g) / 2 g  Evite: Pollo con piel, carne blanca (3 oz/85 g) / 2,5 g  Elija: Pollo, sin piel, carne blanca (3 oz/85 g) / 1 g Lcteos / Grasa saturada (g)   Evite: Leche entera (1 taza) / 5 g  Elija: Leche descremada, 2% (1 taza) / 3 g  Elija: Leche descremada, 1% (1 taza) / 1,5 g  Elija: Leche descremada, 1 taza (0,3 g).  Evite: Queso duro (1 oz/28 g) / 6 g  Elija: Queso de [employer] Corporation (1 oz/28 g) / 2 a 3 g  Evite: Queso cottage, 4% de grasa (1 taza) / 6,5 g  Elija: Queso cottage bajo en grasa, 1% de grasa (1 taza) / 1,5 g  Evite: [place] (1 taza) / 9 g  Elija: Sorbete (1 taza) / 2,5 g  Elija: Yogur congelado descremado (1 taza) / 0,3 g  Elija: Barra de frutas congeladas / trace  Evite: Crema batida (1 cucharada) / 3,5 g  Elija: Crema batida no lctea (1 cucharada) / 1 g Condimentos / Grasas Saturadas (g)   Evite: Mayonesa (1 cucharada) / 2 g  Elija: [place]onesa baja en grasa (1  cucharada) / 1 g  Evite: Mantequilla (1 cucharada) / 7 g  Elija: Margarina light extra (1 cucharada) / 1 g  Evite: Aceite de coco (1 cucharada) / 11,8 g  Elija: Aceite de oliva (1 cucharada) / 1,8 g  Elija: Aceite de maz (1 cucharada) / 1,7 g  Elija: Aceite de crtamo (1 cucharada) / 1,2 g  Elija: Aceite de girasol (1 cucharada) / 1,4 g  Elija: Aceite de soja (1 cucharada) / 0 mg / 2,4 g  Elija: Aceite de canola (1 cucharada) / 0 mg / 1 g Document Released: 10/17/2005 Document Revised: 02/11/2013 ExitCare Patient Information 2014 [place], [place].

## 2014-01-14 NOTE — Progress Notes (Signed)
MRN: [id number] Name: Susan Lopez  Sex: female Age: 52 y.o. DOB: November 27, 1960  Allergies: Hydrocodone and Oxycodone  Chief Complaint  Patient presents with  . Follow-up    HPI: Patient is 53 y.o. female who has history of hypertension, recently had a blood work done found to have high cholesterol level patient is concerned since she has family history of heart disease and has been having symptoms of exertional chest pain and shortness of breath for several months never been evaluated by cardiologist, she takes are blood pressure medication regularly, denies smoking cigarettes. Patient denies any orthopnea or PND.  Past Medical History  Diagnosis Date  . Iron deficiency anemia   . Hypertension   . Arthritis   . Gallstones   . Hemorrhoids, external 10-21-11    occ. bothersome  . Calculus of gallbladder with acute and chronic cholecystitis without obstruction, s/p lap chole 27Dec2012 09/19/2011  . Bone lesion 10/04/2013  . Mammogram abnormal 10/04/2013    Past Surgical History  Procedure Laterality Date  . Cesarean section  1985, 1992  . Eye surgery  1`12-21-10    bil. for tissue growth-laser surgery  . Cholecystectomy  10/27/2011    Procedure: LAPAROSCOPIC CHOLECYSTECTOMY WITH INTRAOPERATIVE CHOLANGIOGRAM;  Surgeon: Adin Hector, MD;  Location: [facility];  Service: General;  Laterality: N/A;  Laparoscopic Chole w/ IOC Single Site      Medication List       This list is accurate as of: 01/14/14  9:57 AM.  Always use your most recent med list.               acetaminophen 500 MG tablet  Commonly known as:  TYLENOL  Take 1 tablet (500 mg total) by mouth every 6 (six) hours as needed for pain.     atorvastatin 20 MG tablet  Commonly known as:  LIPITOR  Take 1 tablet (20 mg total) by mouth daily.     CALCIUM 500 PO  Take by mouth.     docusate sodium 100 MG capsule  Commonly known as:  COLACE  Take 1 capsule (100 mg total) by mouth 2 (two) times daily as  needed for mild constipation.     ferrous sulfate 325 (65 FE) MG tablet  Take 1 tablet (325 mg total) by mouth daily with breakfast.     gabapentin 100 MG capsule  Commonly known as:  NEURONTIN  Take 1 capsule (100 mg total) by mouth 2 (two) times daily.     lisinopril 10 MG tablet  Commonly known as:  PRINIVIL,ZESTRIL  Take 1 tablet (10 mg total) by mouth daily.     meloxicam 15 MG tablet  Commonly known as:  MOBIC  Take 1 tablet (15 mg total) by mouth daily.     multivitamin capsule  Take 1 capsule by mouth daily.     omeprazole 40 MG capsule  Commonly known as:  PRILOSEC  Take 1 capsule (40 mg total) by mouth daily.        Meds ordered this encounter  Medications  . atorvastatin (LIPITOR) 20 MG tablet    Sig: Take 1 tablet (20 mg total) by mouth daily.    Dispense:  90 tablet    Refill:  3     There is no immunization history on file for this patient.  Family History  Problem Relation Age of Onset  . Colon cancer Neg Hx   . Hypertension Mother   . Hypertension Father   . Diabetes Mother  History  Substance Use Topics  . Smoking status: Former Smoker -- 0.25 packs/day for 10 years    Quit date: 10/20/2004  . Smokeless tobacco: Never Used  . Alcohol Use: No    Review of Systems   As noted in HPI  Filed Vitals:   01/14/14 0910  BP: 131/80  Pulse: 84  Temp: 98.2 F (36.8 C)  Resp: 17    Physical Exam  Physical Exam  Constitutional:  Obese female sitting comfortably not in acute distress  Eyes: EOM are normal. Pupils are equal, round, and reactive to light.  Cardiovascular: Normal rate and regular rhythm.   Pulmonary/Chest: Breath sounds normal. No respiratory distress. She has no wheezes. She has no rales.  Musculoskeletal: She exhibits no edema.    CBC    Component Value Date/Time   WBC 6.3 10/08/2013 0853   WBC 7.6 06/10/2013 1700   RBC 4.65 10/08/2013 0853   RBC 4.77 06/10/2013 1700   HGB 14.9 10/08/2013 0853   HGB 14.9 06/10/2013  1700   HCT 43.2 10/08/2013 0853   HCT 43.2 06/10/2013 1700   PLT 245 10/08/2013 0853   PLT 262 06/10/2013 1700   MCV 92.8 10/08/2013 0853   MCV 90.6 06/10/2013 1700   LYMPHSABS 2.1 10/08/2013 0853   LYMPHSABS 2.4 06/10/2013 1700   MONOABS 0.4 10/08/2013 0853   MONOABS 0.4 06/10/2013 1700   EOSABS 0.2 10/08/2013 0853   EOSABS 0.2 06/10/2013 1700   BASOSABS 0.1 10/08/2013 0853   BASOSABS 0.1 06/10/2013 1700    CMP     Component Value Date/Time   NA 142 10/08/2013 0853   NA 140 06/10/2013 1700   K 3.9 10/08/2013 0853   K 3.7 06/10/2013 1700   CL 106 06/10/2013 1700   CO2 23 10/08/2013 0853   CO2 23 06/10/2013 1700   GLUCOSE 100 12/20/2013 1124   GLUCOSE 91 06/10/2013 1700   BUN 10.4 10/08/2013 0853   BUN 11 06/10/2013 1700   CREATININE 0.7 10/08/2013 0853   CREATININE 0.54 06/10/2013 1700   CREATININE 0.61 10/21/2011 1545   CALCIUM 9.7 10/08/2013 0853   CALCIUM 9.3 06/10/2013 1700   PROT 7.6 10/08/2013 0853   PROT 7.1 06/10/2013 1700   ALBUMIN 3.9 10/08/2013 0853   ALBUMIN 4.4 06/10/2013 1700   AST 21 10/08/2013 0853   AST 26 06/10/2013 1700   ALT 45 10/08/2013 0853   ALT 48* 06/10/2013 1700   ALKPHOS 106 10/08/2013 0853   ALKPHOS 103 06/10/2013 1700   BILITOT 0.58 10/08/2013 0853   BILITOT 0.6 06/10/2013 1700   GFRNONAA >90 10/21/2011 1545   GFRAA >90 10/21/2011 1545    Lab Results  Component Value Date/Time   CHOL 231* 12/20/2013 11:24 AM    No components found with this basename: hga1c    Lab Results  Component Value Date/Time   AST 21 10/08/2013  8:53 AM   AST 26 06/10/2013  5:00 PM    Assessment and Plan  Unspecified essential hypertension Controlled continue with her lisinopril 10 mg  Dyslipidemia - Plan: Started patient on atorvastatin (LIPITOR) 20 MG tablet, will repeat lipid panel in 3 months  Exertional chest pain - Plan: Ambulatory referral to Cardiology  Exertional dyspnea - Plan: Ambulatory referral to Cardiology  Family history of heart disease - Plan: Ambulatory referral  to Cardiology  Return in about 3 months (around 04/16/2014) for hypertension, hyperipidemia.  Lorayne Marek, MD

## 2014-01-14 NOTE — Progress Notes (Signed)
Interpreter line used Patient here for follow up for her high cholesterol

## 2014-01-16 ENCOUNTER — Ambulatory Visit (HOSPITAL_BASED_OUTPATIENT_CLINIC_OR_DEPARTMENT_OTHER): Payer: Self-pay

## 2014-01-16 DIAGNOSIS — Z789 Other specified health status: Secondary | ICD-10-CM

## 2014-01-16 NOTE — Progress Notes (Signed)
Patient returns today for Health Coaching regarding Activity, Nutrition, and Cholesterol.  CHOLESTEROL: Per patient she went to [facility] on 3/17 for regular check up. Patient was started on atrovastatin 10mg  qd. Was given information on eating to lower cholesterol.  NUTRITION: Gave handouts from [email or web address] in Spanish about salt, Making Better Choices, My Plate, cookbook and other Handouts for her to take home and review.  ACTIVITY: Patient stated that needs more activity. Per patient has Zumba tape. Discussed activity and getting some materials in Spanish that she could use. Patient has a stretchy band at home.  MENTAL HEALTH: Has an appointment at [place] [facility] for counseling.  PLAN: Will get materials to her for activity in [place]. Will call and check on nutrition progress in two weeks. Will do another  Health Coaching in one to two months.

## 2014-01-16 NOTE — Patient Instructions (Signed)
Discussed nuritition and serving sizes. We particularly discussed Carbohydrates and the kinds and amounts to eat. Discussed side effects related to her cholesterol medication. Patient stated that she understood and had a lot to look over.

## 2014-01-22 ENCOUNTER — Ambulatory Visit: Payer: No Typology Code available for payment source | Attending: Cardiology | Admitting: Cardiology

## 2014-01-22 ENCOUNTER — Encounter: Payer: Self-pay | Admitting: Cardiology

## 2014-01-22 VITALS — BP 123/84 | HR 75 | Temp 98.6°F | Resp 17 | Wt 167.0 lb

## 2014-01-22 DIAGNOSIS — D509 Iron deficiency anemia, unspecified: Secondary | ICD-10-CM | POA: Insufficient documentation

## 2014-01-22 DIAGNOSIS — I10 Essential (primary) hypertension: Secondary | ICD-10-CM | POA: Insufficient documentation

## 2014-01-22 DIAGNOSIS — R0789 Other chest pain: Secondary | ICD-10-CM

## 2014-01-22 DIAGNOSIS — Z8249 Family history of ischemic heart disease and other diseases of the circulatory system: Secondary | ICD-10-CM | POA: Insufficient documentation

## 2014-01-22 DIAGNOSIS — E785 Hyperlipidemia, unspecified: Secondary | ICD-10-CM

## 2014-01-22 DIAGNOSIS — R079 Chest pain, unspecified: Secondary | ICD-10-CM | POA: Insufficient documentation

## 2014-01-22 DIAGNOSIS — Z79899 Other long term (current) drug therapy: Secondary | ICD-10-CM | POA: Insufficient documentation

## 2014-01-22 NOTE — Progress Notes (Signed)
Here with interpreter Has history or HTN States sometimes she feels pressure in her chest And her heart feels like its jumping

## 2014-01-22 NOTE — Progress Notes (Signed)
HPI Ms Susan Lopez comes to the office today for evaluation of chest pain. Her history is obtained by an interpreter. Chest discomfort occurs mostly at rest. She has a sudden pressure in the center of her chest it does not radiate. She does not get short of breath or have nausea or vomiting. It lasts for just a few seconds. Sometimes the pressure can last up to 10 minutes. She also has occasional palpitations that are usually at rest. Activity does not seem to bother her.  She does have a family history of coronary artery disease and has risk factors of hypertension. She does not smoke.  Cholesterol was just checked and was 231. She was started on a statin.  Past Medical History  Diagnosis Date  . Iron deficiency anemia   . Hypertension   . Arthritis   . Gallstones   . Hemorrhoids, external 10-21-11    occ. bothersome  . Calculus of gallbladder with acute and chronic cholecystitis without obstruction, s/p lap chole 27Dec2012 09/19/2011  . Bone lesion 10/04/2013  . Mammogram abnormal 10/04/2013    Current Outpatient Prescriptions  Medication Sig Dispense Refill  . acetaminophen (TYLENOL) 500 MG tablet Take 1 tablet (500 mg total) by mouth every 6 (six) hours as needed for pain.  30 tablet  0  . atorvastatin (LIPITOR) 20 MG tablet Take 1 tablet (20 mg total) by mouth daily.  90 tablet  3  . Calcium-Magnesium-Vitamin D (CALCIUM 500 PO) Take by mouth.      . docusate sodium (COLACE) 100 MG capsule Take 1 capsule (100 mg total) by mouth 2 (two) times daily as needed for mild constipation.  30 capsule  3  . ferrous sulfate 325 (65 FE) MG tablet Take 1 tablet (325 mg total) by mouth daily with breakfast.  30 tablet  5  . gabapentin (NEURONTIN) 100 MG capsule Take 1 capsule (100 mg total) by mouth 2 (two) times daily.  60 capsule  3  . lisinopril (PRINIVIL,ZESTRIL) 10 MG tablet Take 1 tablet (10 mg total) by mouth daily.  30 tablet  3  . meloxicam (MOBIC) 15 MG tablet Take 1 tablet (15 mg  total) by mouth daily.  30 tablet  3  . Multiple Vitamin (MULTIVITAMIN) capsule Take 1 capsule by mouth daily.      Marland Kitchen omeprazole (PRILOSEC) 40 MG capsule Take 1 capsule (40 mg total) by mouth daily.  30 capsule  3   No current facility-administered medications for this visit.    Allergies  Allergen Reactions  . Hydrocodone Nausea And Vomiting  . Oxycodone Rash    Family History  Problem Relation Age of Onset  . Colon cancer Neg Hx   . Hypertension Mother   . Hypertension Father   . Diabetes Mother     History   Social History  . Marital Status: Single    Spouse Name: N/A    Number of Children: N/A  . Years of Education: N/A   Occupational History  . Not on file.   Social History Main Topics  . Smoking status: Former Smoker -- 0.25 packs/day for 10 years    Quit date: 10/20/2004  . Smokeless tobacco: Never Used  . Alcohol Use: No  . Drug Use: No  . Sexual Activity: Yes    Birth Control/ Protection: Post-menopausal   Other Topics Concern  . Not on file   Social History Narrative  . No narrative on file    ROS ALL NEGATIVE EXCEPT THOSE NOTED IN HPI  PE  General Appearance: well developed, well nourished in no acute distress obese, HEENT: symmetrical face, PERRLA, good dentition  Neck: no JVD, thyromegaly, or adenopathy, trachea midline Chest: symmetric without deformity Cardiac: PMI non-displaced, RRR, normal S1, S2, no gallop or murmur Lung: clear to ausculation and percussion Vascular: all pulses full without bruits  Abdominal: nondistended, nontender, good bowel sounds,  Extremities: no cyanosis, clubbing or edema, no sign of DVT, no varicosities  Skin: normal color, no rashes Neuro: alert and oriented x 3, non-focal Pysch: normal affect  EKG Normal sinus rhythm, nonspecific T wave changes anterior lateral. No change from her last EKG in 2014. BMET    Component Value Date/Time   NA 142 10/08/2013 0853   NA 140 06/10/2013 1700   K 3.9 10/08/2013  0853   K 3.7 06/10/2013 1700   CL 106 06/10/2013 1700   CO2 23 10/08/2013 0853   CO2 23 06/10/2013 1700   GLUCOSE 100 12/20/2013 1124   GLUCOSE 91 06/10/2013 1700   BUN 10.4 10/08/2013 0853   BUN 11 06/10/2013 1700   CREATININE 0.7 10/08/2013 0853   CREATININE 0.54 06/10/2013 1700   CREATININE 0.61 10/21/2011 1545   CALCIUM 9.7 10/08/2013 0853   CALCIUM 9.3 06/10/2013 1700   GFRNONAA >90 10/21/2011 1545   GFRAA >90 10/21/2011 1545    Lipid Panel     Component Value Date/Time   CHOL 231* 12/20/2013 1124   TRIG 321* 12/20/2013 1124   HDL 49 12/20/2013 1124   CHOLHDL 4.7 12/20/2013 1124   VLDL 64* 12/20/2013 1124   LDLCALC 118* 12/20/2013 1124    CBC    Component Value Date/Time   WBC 6.3 10/08/2013 0853   WBC 7.6 06/10/2013 1700   RBC 4.65 10/08/2013 0853   RBC 4.77 06/10/2013 1700   HGB 14.9 10/08/2013 0853   HGB 14.9 06/10/2013 1700   HCT 43.2 10/08/2013 0853   HCT 43.2 06/10/2013 1700   PLT 245 10/08/2013 0853   PLT 262 06/10/2013 1700   MCV 92.8 10/08/2013 0853   MCV 90.6 06/10/2013 1700   MCH 32.1 10/08/2013 0853   MCH 31.2 06/10/2013 1700   MCHC 34.6 10/08/2013 0853   MCHC 34.5 06/10/2013 1700   RDW 12.8 10/08/2013 0853   RDW 13.3 06/10/2013 1700   LYMPHSABS 2.1 10/08/2013 0853   LYMPHSABS 2.4 06/10/2013 1700   MONOABS 0.4 10/08/2013 0853   MONOABS 0.4 06/10/2013 1700   EOSABS 0.2 10/08/2013 0853   EOSABS 0.2 06/10/2013 1700   BASOSABS 0.1 10/08/2013 0853   BASOSABS 0.1 06/10/2013 1700

## 2014-01-22 NOTE — Assessment & Plan Note (Signed)
Her chest discomfort is noncardiac. I suspect is mostly due to to stress, muscle spasm. She is on good medications for primary prevention of coronary artery disease. Patient reassured and all questions answered. No further cardiac followup needed.

## 2014-04-16 ENCOUNTER — Encounter: Payer: Self-pay | Admitting: Internal Medicine

## 2014-04-16 ENCOUNTER — Other Ambulatory Visit (HOSPITAL_COMMUNITY)
Admission: RE | Admit: 2014-04-16 | Discharge: 2014-04-16 | Disposition: A | Discharge status: Home / Self Care | Payer: No Typology Code available for payment source | Source: Ambulatory Visit | Attending: Internal Medicine | Admitting: Internal Medicine

## 2014-04-16 ENCOUNTER — Other Ambulatory Visit: Payer: Self-pay | Admitting: Internal Medicine

## 2014-04-16 ENCOUNTER — Ambulatory Visit: Payer: No Typology Code available for payment source | Attending: Internal Medicine

## 2014-04-16 ENCOUNTER — Ambulatory Visit: Payer: No Typology Code available for payment source | Attending: Internal Medicine | Admitting: Internal Medicine

## 2014-04-16 VITALS — BP 105/71 | HR 75 | Temp 98.9°F | Resp 16 | Ht 63.0 in | Wt 164.0 lb

## 2014-04-16 DIAGNOSIS — Z113 Encounter for screening for infections with a predominantly sexual mode of transmission: Secondary | ICD-10-CM | POA: Insufficient documentation

## 2014-04-16 DIAGNOSIS — E785 Hyperlipidemia, unspecified: Secondary | ICD-10-CM | POA: Insufficient documentation

## 2014-04-16 DIAGNOSIS — M129 Arthropathy, unspecified: Secondary | ICD-10-CM | POA: Insufficient documentation

## 2014-04-16 DIAGNOSIS — N760 Acute vaginitis: Secondary | ICD-10-CM | POA: Insufficient documentation

## 2014-04-16 DIAGNOSIS — Z01419 Encounter for gynecological examination (general) (routine) without abnormal findings: Secondary | ICD-10-CM | POA: Insufficient documentation

## 2014-04-16 DIAGNOSIS — N816 Rectocele: Secondary | ICD-10-CM

## 2014-04-16 DIAGNOSIS — Z79899 Other long term (current) drug therapy: Secondary | ICD-10-CM | POA: Insufficient documentation

## 2014-04-16 DIAGNOSIS — N8111 Cystocele, midline: Secondary | ICD-10-CM

## 2014-04-16 DIAGNOSIS — N390 Urinary tract infection, site not specified: Secondary | ICD-10-CM

## 2014-04-16 DIAGNOSIS — I10 Essential (primary) hypertension: Secondary | ICD-10-CM | POA: Insufficient documentation

## 2014-04-16 DIAGNOSIS — D509 Iron deficiency anemia, unspecified: Secondary | ICD-10-CM | POA: Insufficient documentation

## 2014-04-16 DIAGNOSIS — N8110 Cystocele, unspecified: Secondary | ICD-10-CM

## 2014-04-16 DIAGNOSIS — N8112 Cystocele, lateral: Secondary | ICD-10-CM | POA: Insufficient documentation

## 2014-04-16 DIAGNOSIS — Z791 Long term (current) use of non-steroidal anti-inflammatories (NSAID): Secondary | ICD-10-CM | POA: Insufficient documentation

## 2014-04-16 DIAGNOSIS — R079 Chest pain, unspecified: Secondary | ICD-10-CM

## 2014-04-16 DIAGNOSIS — Z124 Encounter for screening for malignant neoplasm of cervix: Secondary | ICD-10-CM

## 2014-04-16 LAB — POCT URINALYSIS DIPSTICK
Bilirubin, UA: NEGATIVE
Glucose, UA: NEGATIVE
KETONES UA: NEGATIVE
Leukocytes, UA: NEGATIVE
Nitrite, UA: NEGATIVE
PH UA: 6.5
Protein, UA: NEGATIVE
SPEC GRAV UA: 1.025
UROBILINOGEN UA: 0.2

## 2014-04-16 MED ORDER — OMEPRAZOLE 40 MG PO CPDR: 40.0000 mg | DELAYED_RELEASE_CAPSULE | Freq: Every day | ORAL | Status: DC

## 2014-04-16 MED ORDER — SULFAMETHOXAZOLE-TMP DS 800-160 MG PO TABS
1.0000 | ORAL_TABLET | Freq: Two times a day (BID) | ORAL | Status: DC
Start: 2014-04-16 — End: 2014-08-04

## 2014-04-16 NOTE — Progress Notes (Signed)
Patient ID: Susan Lopez, female   DOB: 07-03-1961, 53 y.o.   MRN: [id number]   Arneisha Kincannon, is a 53 y.o. female  GDJ:[id number]  QQI:[id number]  DOB - 27-Nov-1960  Chief Complaint  Patient presents with  . Follow-up        Subjective:   Susan Lopez is a 53 y.o. female here today for a follow up visit. Patient has history of hypertension, osteoarthritis, iron deficiency anemia and hyperlipidemia here today with major complaint of dysuria and polyuria. Patient has past history of multiple UTIs she thinks she might be having a UTI today. She also wants to follow with her high cholesterol. Patient has No headache, No chest pain, No abdominal pain - No Nausea, No new weakness tingling or numbness, No Cough - SOB.  Problem  Uti (Urinary Tract Infection)  Pap Smear for Cervical Cancer Screening  Cystocele With Rectocele    ALLERGIES: Allergies  Allergen Reactions  . Hydrocodone Nausea And Vomiting  . Oxycodone Rash    PAST MEDICAL HISTORY: Past Medical History  Diagnosis Date  . Iron deficiency anemia   . Hypertension   . Arthritis   . Gallstones   . Hemorrhoids, external 10-21-11    occ. bothersome  . Calculus of gallbladder with acute and chronic cholecystitis without obstruction, s/p lap chole 27Dec2012 09/19/2011  . Bone lesion 10/04/2013  . Mammogram abnormal 10/04/2013    MEDICATIONS AT HOME: Prior to Admission medications   Medication Sig Start Date End Date Taking? Authorizing Provider  acetaminophen (TYLENOL) 500 MG tablet Take 1 tablet (500 mg total) by mouth every 6 (six) hours as needed for pain. 07/08/13  Yes Shanker Kristeen Mans, MD  atorvastatin (LIPITOR) 20 MG tablet Take 1 tablet (20 mg total) by mouth daily. 01/14/14  Yes Lorayne Marek, MD  Calcium-Magnesium-Vitamin D (CALCIUM 500 PO) Take by mouth.   Yes Historical Provider, MD  docusate sodium (COLACE) 100 MG capsule Take 1 capsule (100 mg total) by mouth 2 (two) times daily as  needed for mild constipation. 10/16/13  Yes Theodis Blaze, MD  ferrous sulfate 325 (65 FE) MG tablet Take 1 tablet (325 mg total) by mouth daily with breakfast. 10/16/13  Yes Theodis Blaze, MD  gabapentin (NEURONTIN) 100 MG capsule Take 1 capsule (100 mg total) by mouth 2 (two) times daily. 10/16/13  Yes Theodis Blaze, MD  lisinopril (PRINIVIL,ZESTRIL) 10 MG tablet Take 1 tablet (10 mg total) by mouth daily. 10/16/13  Yes Theodis Blaze, MD  meloxicam (MOBIC) 15 MG tablet Take 1 tablet (15 mg total) by mouth daily. 10/16/13  Yes Theodis Blaze, MD  Multiple Vitamin (MULTIVITAMIN) capsule Take 1 capsule by mouth daily.   Yes Historical Provider, MD  omeprazole (PRILOSEC) 40 MG capsule Take 1 capsule (40 mg total) by mouth daily. 04/16/14  Yes Angelica Chessman, MD  sulfamethoxazole-trimethoprim (BACTRIM DS) 800-160 MG per tablet Take 1 tablet by mouth 2 (two) times daily. 04/16/14   Angelica Chessman, MD     Objective:   Filed Vitals:   04/16/14 1037  BP: 105/71  Pulse: 75  Temp: 98.9 F (37.2 C)  TempSrc: Oral  Resp: 16  Height: 5\' 3"  (1.6 m)  Weight: 164 lb (74.39 kg)  SpO2: 98%    Exam General appearance : Awake, alert, not in any distress. Speech Clear. Not toxic looking HEENT: Atraumatic and Normocephalic, pupils equally reactive to light and accomodation Neck: supple, no JVD. No cervical lymphadenopathy.  Chest:Good air entry bilaterally, no added  sounds  CVS: S1 S2 regular, no murmurs.  Abdomen: Bowel sounds present, Non tender and not distended with no gaurding, rigidity or rebound. Extremities: B/L Lower Ext shows no edema, both legs are warm to touch Neurology: Awake alert, and oriented X 3, CN II-XII intact, Non focal Skin:No Rash Wounds:N/A  Data Review Lab Results  Component Value Date   HGBA1C 5.6 12/20/2013   HGBA1C 5.4 06/10/2013     Assessment & Plan   1. UTI (urinary tract infection)  - Urinalysis Dipstick  - sulfamethoxazole-trimethoprim (BACTRIM DS)  800-160 MG per tablet; Take 1 tablet by mouth 2 (two) times daily.  Dispense: 14 tablet; Refill: 0  2. Chest pain, unspecified  - omeprazole (PRILOSEC) 40 MG capsule; Take 1 capsule (40 mg total) by mouth daily.  Dispense: 30 capsule; Refill: 3  3. Pap smear for cervical cancer screening  - Cytology - PAP - Cervicovaginal ancillary only  4. Cystocele with rectocele  - Ambulatory referral to Urology   Return in about 3 months (around 07/17/2014), or if symptoms worsen or fail to improve, for Follow up HTN, Follow up Pain and comorbidities.  The patient was given clear instructions to go to ER or return to medical center if symptoms don't improve, worsen or new problems develop. The patient verbalized understanding. The patient was told to call to get lab results if they haven't heard anything in the next week.   This note has been created with Surveyor, quantity. Any transcriptional errors are unintentional.    Angelica Chessman, MD, [place], [place], [place] and [place], [place]   04/16/2014, 5:55 PM

## 2014-04-16 NOTE — Progress Notes (Signed)
Pt states that she probably has a UTI. Pt said that she has polyuria and multiple uti's Pt was told to follow up on her high cholesterol

## 2014-04-18 LAB — CYTOLOGY - PAP

## 2014-04-21 ENCOUNTER — Telehealth: Payer: Self-pay | Admitting: Emergency Medicine

## 2014-04-21 NOTE — Telephone Encounter (Signed)
Message copied by Ricci Barker on Mon Apr 21, 2014  5:26 PM ------      Message from: Tresa Garter      Created: Sun Apr 20, 2014  5:09 PM       Please inform patient that her Pap smear result is negative for malignancy, and negative for infections. ------

## 2014-05-27 ENCOUNTER — Other Ambulatory Visit: Payer: Self-pay | Admitting: Urology

## 2014-05-27 ENCOUNTER — Ambulatory Visit (INDEPENDENT_AMBULATORY_CARE_PROVIDER_SITE_OTHER): Payer: Self-pay | Admitting: Urology

## 2014-05-27 DIAGNOSIS — N8111 Cystocele, midline: Secondary | ICD-10-CM

## 2014-05-27 DIAGNOSIS — R1032 Left lower quadrant pain: Secondary | ICD-10-CM

## 2014-06-05 ENCOUNTER — Other Ambulatory Visit: Payer: Self-pay | Admitting: Internal Medicine

## 2014-06-05 ENCOUNTER — Other Ambulatory Visit: Payer: Self-pay | Admitting: Emergency Medicine

## 2014-06-05 DIAGNOSIS — I10 Essential (primary) hypertension: Secondary | ICD-10-CM

## 2014-06-05 MED ORDER — LISINOPRIL 10 MG PO TABS: 10.0000 mg | ORAL_TABLET | Freq: Every day | ORAL | Status: DC

## 2014-06-18 ENCOUNTER — Ambulatory Visit (HOSPITAL_COMMUNITY)
Admission: RE | Admit: 2014-06-18 | Discharge: 2014-06-18 | Disposition: A | Discharge status: Home / Self Care | Payer: Self-pay | Source: Ambulatory Visit | Attending: Urology | Admitting: Urology

## 2014-06-18 ENCOUNTER — Encounter (HOSPITAL_COMMUNITY): Payer: Self-pay

## 2014-06-18 DIAGNOSIS — K7689 Other specified diseases of liver: Secondary | ICD-10-CM | POA: Insufficient documentation

## 2014-06-18 DIAGNOSIS — D259 Leiomyoma of uterus, unspecified: Secondary | ICD-10-CM | POA: Insufficient documentation

## 2014-06-18 DIAGNOSIS — R1032 Left lower quadrant pain: Secondary | ICD-10-CM

## 2014-06-18 DIAGNOSIS — D1809 Hemangioma of other sites: Secondary | ICD-10-CM | POA: Insufficient documentation

## 2014-06-18 MED ORDER — IOHEXOL 300 MG/ML  SOLN
100.0000 mL | Freq: Once | INTRAMUSCULAR | Status: AC | PRN
  Administered 2014-06-18: 100 mL via INTRAVENOUS

## 2014-07-17 ENCOUNTER — Ambulatory Visit: Payer: Self-pay | Attending: Internal Medicine | Admitting: Internal Medicine

## 2014-07-17 ENCOUNTER — Encounter: Payer: Self-pay | Admitting: Internal Medicine

## 2014-07-17 VITALS — BP 121/73 | HR 83 | Temp 98.1°F | Resp 16 | Ht 60.0 in | Wt 168.0 lb

## 2014-07-17 DIAGNOSIS — R079 Chest pain, unspecified: Secondary | ICD-10-CM

## 2014-07-17 DIAGNOSIS — R519 Headache, unspecified: Secondary | ICD-10-CM | POA: Insufficient documentation

## 2014-07-17 DIAGNOSIS — R51 Headache: Secondary | ICD-10-CM | POA: Insufficient documentation

## 2014-07-17 DIAGNOSIS — I10 Essential (primary) hypertension: Secondary | ICD-10-CM | POA: Insufficient documentation

## 2014-07-17 DIAGNOSIS — D509 Iron deficiency anemia, unspecified: Secondary | ICD-10-CM | POA: Insufficient documentation

## 2014-07-17 DIAGNOSIS — Z79899 Other long term (current) drug therapy: Secondary | ICD-10-CM | POA: Insufficient documentation

## 2014-07-17 DIAGNOSIS — Z791 Long term (current) use of non-steroidal anti-inflammatories (NSAID): Secondary | ICD-10-CM | POA: Insufficient documentation

## 2014-07-17 DIAGNOSIS — E785 Hyperlipidemia, unspecified: Secondary | ICD-10-CM | POA: Insufficient documentation

## 2014-07-17 LAB — CBC WITH DIFFERENTIAL/PLATELET
Basophils Absolute: 0.1 10*3/uL (ref 0.0–0.1)
Basophils Relative: 1 % (ref 0–1)
Eosinophils Absolute: 0.1 10*3/uL (ref 0.0–0.7)
Eosinophils Relative: 2 % (ref 0–5)
HCT: 43.3 % (ref 36.0–46.0)
Hemoglobin: 14.9 g/dL (ref 12.0–15.0)
LYMPHS ABS: 2 10*3/uL (ref 0.7–4.0)
Lymphocytes Relative: 31 % (ref 12–46)
MCH: 30.6 pg (ref 26.0–34.0)
MCHC: 34.4 g/dL (ref 30.0–36.0)
MCV: 88.9 fL (ref 78.0–100.0)
Monocytes Absolute: 0.4 10*3/uL (ref 0.1–1.0)
Monocytes Relative: 6 % (ref 3–12)
NEUTROS PCT: 60 % (ref 43–77)
Neutro Abs: 3.8 10*3/uL (ref 1.7–7.7)
PLATELETS: 256 10*3/uL (ref 150–400)
RBC: 4.87 MIL/uL (ref 3.87–5.11)
RDW: 13.4 % (ref 11.5–15.5)
WBC: 6.3 10*3/uL (ref 4.0–10.5)

## 2014-07-17 LAB — COMPLETE METABOLIC PANEL WITH GFR
ALBUMIN: 4.4 g/dL (ref 3.5–5.2)
ALT: 59 U/L — ABNORMAL HIGH (ref 0–35)
AST: 28 U/L (ref 0–37)
Alkaline Phosphatase: 115 U/L (ref 39–117)
BUN: 11 mg/dL (ref 6–23)
CALCIUM: 10 mg/dL (ref 8.4–10.5)
CO2: 29 meq/L (ref 19–32)
Chloride: 105 mEq/L (ref 96–112)
Creat: 0.58 mg/dL (ref 0.50–1.10)
GFR, Est Non African American: >89 mL/min
Glucose, Bld: 97 mg/dL (ref 70–99)
POTASSIUM: 4.6 meq/L (ref 3.5–5.3)
Sodium: 143 mEq/L (ref 135–145)
Total Bilirubin: 0.4 mg/dL (ref 0.2–1.2)
Total Protein: 7.3 g/dL (ref 6.0–8.3)

## 2014-07-17 MED ORDER — GABAPENTIN 100 MG PO CAPS: 100.0000 mg | ORAL_CAPSULE | Freq: Two times a day (BID) | ORAL | Status: DC

## 2014-07-17 MED ORDER — MULTIVITAMINS PO CAPS: 1.0000 | ORAL_CAPSULE | Freq: Every day | ORAL | Status: DC

## 2014-07-17 MED ORDER — OMEPRAZOLE 40 MG PO CPDR
40.0000 mg | DELAYED_RELEASE_CAPSULE | Freq: Every day | ORAL | Status: DC
Start: 2014-07-17 — End: 2014-12-11

## 2014-07-17 MED ORDER — FERROUS SULFATE 325 (65 FE) MG PO TABS: 325.0000 mg | ORAL_TABLET | Freq: Every day | ORAL | Status: DC

## 2014-07-17 MED ORDER — ATORVASTATIN CALCIUM 20 MG PO TABS: 20.0000 mg | ORAL_TABLET | Freq: Every day | ORAL | Status: DC

## 2014-07-17 MED ORDER — LISINOPRIL 10 MG PO TABS: 10.0000 mg | ORAL_TABLET | Freq: Every day | ORAL | Status: DC

## 2014-07-17 MED ORDER — MELOXICAM 15 MG PO TABS: 15.0000 mg | ORAL_TABLET | Freq: Every day | ORAL | Status: DC

## 2014-07-17 NOTE — Progress Notes (Signed)
Pt is here following up on her HTN. Pt reports that she is having a constant head ache for a month. Pt states that she is getting confused lately.

## 2014-07-18 LAB — URINALYSIS, COMPLETE
Bacteria, UA: NONE SEEN
Bilirubin Urine: NEGATIVE
Casts: NONE SEEN
Crystals: NONE SEEN
GLUCOSE, UA: NEGATIVE mg/dL
HGB URINE DIPSTICK: NEGATIVE
Ketones, ur: NEGATIVE mg/dL
LEUKOCYTES UA: NEGATIVE
Nitrite: NEGATIVE
Protein, ur: NEGATIVE mg/dL
Specific Gravity, Urine: 1.018 (ref 1.005–1.030)
Squamous Epithelial / LPF: NONE SEEN
Urobilinogen, UA: 0.2 mg/dL (ref 0.0–1.0)
pH: 6.5 (ref 5.0–8.0)

## 2014-07-18 LAB — TSH: TSH: 1.471 u[IU]/mL (ref 0.350–4.500)

## 2014-07-25 ENCOUNTER — Ambulatory Visit: Payer: Self-pay

## 2014-07-28 ENCOUNTER — Ambulatory Visit: Payer: Self-pay

## 2014-07-28 ENCOUNTER — Ambulatory Visit: Payer: Self-pay | Attending: Internal Medicine

## 2014-08-04 ENCOUNTER — Emergency Department (HOSPITAL_COMMUNITY)
Admission: EM | Admit: 2014-08-04 | Discharge: 2014-08-05 | Disposition: A | Discharge status: Home / Self Care | Payer: Self-pay | Attending: Emergency Medicine | Admitting: Emergency Medicine

## 2014-08-04 ENCOUNTER — Encounter (HOSPITAL_COMMUNITY): Payer: Self-pay | Admitting: Emergency Medicine

## 2014-08-04 ENCOUNTER — Emergency Department (HOSPITAL_COMMUNITY): Payer: Self-pay

## 2014-08-04 DIAGNOSIS — Z87891 Personal history of nicotine dependence: Secondary | ICD-10-CM | POA: Insufficient documentation

## 2014-08-04 DIAGNOSIS — R079 Chest pain, unspecified: Secondary | ICD-10-CM | POA: Insufficient documentation

## 2014-08-04 DIAGNOSIS — Z79899 Other long term (current) drug therapy: Secondary | ICD-10-CM | POA: Insufficient documentation

## 2014-08-04 DIAGNOSIS — M1990 Unspecified osteoarthritis, unspecified site: Secondary | ICD-10-CM | POA: Insufficient documentation

## 2014-08-04 DIAGNOSIS — D509 Iron deficiency anemia, unspecified: Secondary | ICD-10-CM | POA: Insufficient documentation

## 2014-08-04 DIAGNOSIS — R51 Headache: Secondary | ICD-10-CM | POA: Insufficient documentation

## 2014-08-04 DIAGNOSIS — R200 Anesthesia of skin: Secondary | ICD-10-CM | POA: Insufficient documentation

## 2014-08-04 DIAGNOSIS — F329 Major depressive disorder, single episode, unspecified: Secondary | ICD-10-CM | POA: Insufficient documentation

## 2014-08-04 DIAGNOSIS — F32A Depression, unspecified: Secondary | ICD-10-CM

## 2014-08-04 DIAGNOSIS — Z791 Long term (current) use of non-steroidal anti-inflammatories (NSAID): Secondary | ICD-10-CM | POA: Insufficient documentation

## 2014-08-04 DIAGNOSIS — M549 Dorsalgia, unspecified: Secondary | ICD-10-CM | POA: Insufficient documentation

## 2014-08-04 DIAGNOSIS — Z8719 Personal history of other diseases of the digestive system: Secondary | ICD-10-CM | POA: Insufficient documentation

## 2014-08-04 DIAGNOSIS — I10 Essential (primary) hypertension: Secondary | ICD-10-CM | POA: Insufficient documentation

## 2014-08-04 LAB — BASIC METABOLIC PANEL
Anion gap: 12 (ref 5–15)
BUN: 11 mg/dL (ref 6–23)
CALCIUM: 9.2 mg/dL (ref 8.4–10.5)
CHLORIDE: 103 meq/L (ref 96–112)
CO2: 26 mEq/L (ref 19–32)
CREATININE: 0.71 mg/dL (ref 0.50–1.10)
GFR calc Af Amer: >90 mL/min (ref >90)
GFR calc non Af Amer: >90 mL/min (ref >90)
GLUCOSE: 121 mg/dL — AB (ref 70–99)
Potassium: 4 mEq/L (ref 3.7–5.3)
Sodium: 141 mEq/L (ref 137–147)

## 2014-08-04 LAB — CBC
HCT: 42.4 % (ref 36.0–46.0)
Hemoglobin: 14.5 g/dL (ref 12.0–15.0)
MCH: 31.3 pg (ref 26.0–34.0)
MCHC: 34.2 g/dL (ref 30.0–36.0)
MCV: 91.4 fL (ref 78.0–100.0)
PLATELETS: 261 10*3/uL (ref 150–400)
RBC: 4.64 MIL/uL (ref 3.87–5.11)
RDW: 12.6 % (ref 11.5–15.5)
WBC: 7.4 10*3/uL (ref 4.0–10.5)

## 2014-08-04 LAB — I-STAT TROPONIN, ED: Troponin i, poc: 0 ng/mL (ref 0.00–0.08)

## 2014-08-04 MED ORDER — METOCLOPRAMIDE HCL 10 MG PO TABS
10.0000 mg | ORAL_TABLET | Freq: Once | ORAL | Status: AC
  Administered 2014-08-05: 10 mg via ORAL
  Filled 2014-08-04: qty 1

## 2014-08-04 MED ORDER — SODIUM CHLORIDE 0.9 % IV BOLUS (SEPSIS): 1000.0000 mL | Freq: Once | INTRAVENOUS | Status: DC

## 2014-08-04 MED ORDER — DIPHENHYDRAMINE HCL 25 MG PO CAPS
25.0000 mg | ORAL_CAPSULE | Freq: Once | ORAL | Status: AC
  Administered 2014-08-05: 25 mg via ORAL
  Filled 2014-08-04: qty 1

## 2014-08-04 MED ORDER — DEXAMETHASONE SODIUM PHOSPHATE 10 MG/ML IJ SOLN: 10.0000 mg | Freq: Once | INTRAMUSCULAR | Status: DC

## 2014-08-04 MED ORDER — KETOROLAC TROMETHAMINE 60 MG/2ML IM SOLN
60.0000 mg | Freq: Once | INTRAMUSCULAR | Status: AC
  Administered 2014-08-05: 60 mg via INTRAMUSCULAR
  Filled 2014-08-04: qty 2

## 2014-08-04 MED ORDER — ASPIRIN EC 81 MG PO TBEC
81.0000 mg | DELAYED_RELEASE_TABLET | Freq: Once | ORAL | Status: AC
  Administered 2014-08-05: 81 mg via ORAL
  Filled 2014-08-04: qty 1

## 2014-08-04 MED ORDER — METOCLOPRAMIDE HCL 5 MG/ML IJ SOLN: 10.0000 mg | Freq: Once | INTRAMUSCULAR | Status: DC

## 2014-08-04 MED ORDER — DIPHENHYDRAMINE HCL 50 MG/ML IJ SOLN: 25.0000 mg | Freq: Once | INTRAMUSCULAR | Status: DC

## 2014-08-04 NOTE — ED Provider Notes (Signed)
CSN: [id number]     Arrival date & time 08/04/14  2017 History   First MD Initiated Contact with Patient 08/04/14 2230     Chief Complaint  Patient presents with  . Chest Pain  . Back Pain  . Abdominal Pain   Patient is a 53 y.o. female presenting with chest pain. The history is provided by the patient. The history is limited by a language barrier. A language interpreter was used.  Chest Pain Pain location:  Substernal area Pain quality: pressure   Pain radiates to:  L arm and mid back Pain radiates to the back: yes   Pain severity:  Moderate Onset quality:  Gradual Timing:  Constant Progression:  Worsening Chronicity:  New Relieved by:  Nothing Worsened by:  Nothing tried Ineffective treatments:  None tried Associated symptoms: back pain, headache and numbness   Associated symptoms: no abdominal pain, no dizziness, no fever, no nausea, no palpitations, no shortness of breath and not vomiting   Risk factors: high cholesterol and hypertension     53 year old Hispanic female with a history of hypertension, iron deficiency anemia who presents with chest pain and headache. Patient states that for approximately one week she has had pain in her chest and back. She also reports a headache that comes and goes. She occasionally has pain radiating down her left arm with numbness. She denies diaphoresis, shortness of breath. She denies blurry vision. She also reports that she has been depressed and has trouble remembering things. The pain feels like pressure. The pain does not radiate to her jaw or neck.  Past Medical History  Diagnosis Date  . Iron deficiency anemia   . Hypertension   . Arthritis   . Gallstones   . Hemorrhoids, external 10-21-11    occ. bothersome  . Calculus of gallbladder with acute and chronic cholecystitis without obstruction, s/p lap chole [id number] 09/19/2011  . Bone lesion 10/04/2013  . Mammogram abnormal 10/04/2013   Past Surgical History  Procedure Laterality  Date  . Cesarean section  1985, 1992  . Eye surgery  1`12-21-10    bil. for tissue growth-laser surgery  . Cholecystectomy  10/27/2011    Procedure: LAPAROSCOPIC CHOLECYSTECTOMY WITH INTRAOPERATIVE CHOLANGIOGRAM;  Surgeon: Adin Hector, MD;  Location: [facility];  Service: General;  Laterality: N/A;  Laparoscopic Chole w/ IOC Single Site   Family History  Problem Relation Age of Onset  . Colon cancer Neg Hx   . Hypertension Mother   . Hypertension Father   . Diabetes Mother    History  Substance Use Topics  . Smoking status: Former Smoker -- 0.25 packs/day for 10 years    Quit date: 10/20/2004  . Smokeless tobacco: Never Used  . Alcohol Use: No   OB History   Grav Para Term Preterm Abortions TAB SAB Ect Mult Living   4 3 3  0 1 0 1 0 1 2     Review of Systems  Constitutional: Negative for fever.  HENT: Negative for rhinorrhea and sore throat.   Eyes: Negative for visual disturbance.  Respiratory: Negative for chest tightness and shortness of breath.   Cardiovascular: Positive for chest pain. Negative for palpitations.  Gastrointestinal: Negative for nausea, vomiting, abdominal pain and constipation.  Genitourinary: Negative for dysuria and hematuria.  Musculoskeletal: Positive for back pain. Negative for neck pain.  Skin: Negative for rash.  Neurological: Positive for numbness and headaches. Negative for dizziness.  Psychiatric/Behavioral: Positive for confusion.       Depression  All other systems reviewed and are negative.  Allergies  Hydrocodone and Oxycodone  Home Medications   Prior to Admission medications   Medication Sig Start Date End Date Taking? Authorizing Provider  acetaminophen (TYLENOL) 500 MG tablet Take 1 tablet (500 mg total) by mouth every 6 (six) hours as needed for pain. 07/08/13   Shanker Kristeen Mans, MD  atorvastatin (LIPITOR) 20 MG tablet Take 1 tablet (20 mg total) by mouth daily. 07/17/14   Tresa Garter, MD  Calcium-Magnesium-Vitamin D  (CALCIUM 500 PO) Take by mouth.    Historical Provider, MD  docusate sodium (COLACE) 100 MG capsule Take 1 capsule (100 mg total) by mouth 2 (two) times daily as needed for mild constipation. 10/16/13   Theodis Blaze, MD  ferrous sulfate 325 (65 FE) MG tablet Take 1 tablet (325 mg total) by mouth daily with breakfast. 07/17/14   Tresa Garter, MD  gabapentin (NEURONTIN) 100 MG capsule Take 1 capsule (100 mg total) by mouth 2 (two) times daily. 07/17/14   Tresa Garter, MD  lisinopril (PRINIVIL,ZESTRIL) 10 MG tablet Take 1 tablet (10 mg total) by mouth daily. 07/17/14   Tresa Garter, MD  meloxicam (MOBIC) 15 MG tablet Take 1 tablet (15 mg total) by mouth daily. 07/17/14   Tresa Garter, MD  Multiple Vitamin (MULTIVITAMIN) capsule Take 1 capsule by mouth daily. 07/17/14   Tresa Garter, MD  omeprazole (PRILOSEC) 40 MG capsule Take 1 capsule (40 mg total) by mouth daily. 07/17/14   Tresa Garter, MD  sulfamethoxazole-trimethoprim (BACTRIM DS) 800-160 MG per tablet Take 1 tablet by mouth 2 (two) times daily. 04/16/14   Tresa Garter, MD   BP 126/77  Pulse 97  Temp(Src) 98.9 F (37.2 C) (Oral)  Resp 14  Ht 5\' 4"  (1.626 m)  Wt 170 lb (77.111 kg)  BMI 29.17 kg/m2  SpO2 96%  LMP 08/20/2010  Physical Exam  Constitutional: She is oriented to person, place, and time. She appears well-developed and well-nourished. No distress.  HENT:  Head: Normocephalic and atraumatic.  Mouth/Throat: Oropharynx is clear and moist.  Eyes: EOM are normal. Pupils are equal, round, and reactive to light.  Neck: Neck supple. No JVD present.  Cardiovascular: Normal rate, regular rhythm, normal heart sounds and intact distal pulses.  Exam reveals no gallop.   No murmur heard. Pulmonary/Chest: Effort normal and breath sounds normal. She has no wheezes. She has no rales.  Abdominal: Soft. She exhibits no distension. There is no tenderness.  Musculoskeletal: Normal range of motion. She  exhibits no tenderness.  Neurological: She is alert and oriented to person, place, and time. She displays no tremor. No cranial nerve deficit or sensory deficit. She exhibits normal muscle tone. Coordination and gait normal. GCS eye subscore is 4. GCS verbal subscore is 5. GCS motor subscore is 6. She displays no Babinski's sign on the right side. She displays no Babinski's sign on the left side.  Reflex Scores:      Tricep reflexes are 2+ on the right side and 2+ on the left side.      Bicep reflexes are 2+ on the right side and 2+ on the left side.      Brachioradialis reflexes are 2+ on the right side and 2+ on the left side.      Patellar reflexes are 2+ on the right side and 2+ on the left side.      Achilles reflexes are 2+ on the right side  and 2+ on the left side. Skin: Skin is warm and dry. No rash noted.  Psychiatric: Her behavior is normal.    ED Course  Procedures  None   Labs Review Labs Reviewed  BASIC METABOLIC PANEL - Abnormal; Notable for the following:    Glucose, Bld 121 (*)    All other components within normal limits  CBC  I-STAT TROPOININ, ED    Imaging Review Dg Chest 2 View  08/04/2014   CLINICAL DATA:  Initial evaluation for chest pain, back pain. History of hypertension.  EXAM: CHEST  2 VIEW  COMPARISON:  Prior radiograph from 01/18/2013  FINDINGS: The cardiac and mediastinal silhouettes are stable in size and contour, and remain within normal limits.  The lungs are mildly hypoinflated. No airspace consolidation, pleural effusion, or pulmonary edema is identified. There is no pneumothorax.  No acute osseous abnormality identified.  IMPRESSION: No active cardiopulmonary disease.   Electronically Signed   By: Jeannine Boga M.D.   On: 08/04/2014 21:45   MDM   Final diagnoses:  Depression  Chest pain, unspecified chest pain type    53 year old Hispanic female presents with chest pain and headache. Patient is well appearing on exam. She is afebrile,  VSS. EKG shows sinus rhythm, rate 92 with no ST segment elevation or depression. No other ischemic changes. Troponin is negative. Neurologic exam is nonfocal. Hb 14.5. Chest x-ray unremarkable. Will give headache cocktail and reassess the patient. We'll give aspirin 325 mg. Will order second troponin for 1215. Care assumed by Dr. Claudine Mouton at 11:30PM. Please see his not for further ED management and disposition.   Case discussed with Dr. Claudine Mouton.   Gustavus Bryant, MD 08/05/14 [phone]

## 2014-08-04 NOTE — ED Notes (Signed)
Pt. reports intermittent mid chest pain/pressure  radiating to mid back onset Wednesday last week with headache , left arm numbness , low abdominal pain , denies SOB / mild nausea , no diaphoresis .

## 2014-08-05 ENCOUNTER — Emergency Department (HOSPITAL_COMMUNITY): Payer: Self-pay

## 2014-08-05 LAB — I-STAT TROPONIN, ED: TROPONIN I, POC: 0 ng/mL (ref 0.00–0.08)

## 2014-08-05 MED ORDER — IBUPROFEN 800 MG PO TABS: 800.0000 mg | ORAL_TABLET | Freq: Three times a day (TID) | ORAL | Status: DC | PRN

## 2014-08-05 MED ORDER — SODIUM CHLORIDE 0.9 % IV BOLUS (SEPSIS): 1000.0000 mL | Freq: Once | INTRAVENOUS | Status: DC

## 2014-08-05 NOTE — Discharge Instructions (Signed)
Depresin (Depression) [name], you were seen today for depression, headache and chest pain.  Your CT scans were normal and your heart markers were normal.  Continue to take motrin at home as needed for pain and follow up with your regular doctor within 3 days for continued care of your headache, chest pain, and depression.  If any symptoms worsen, come back to the ED immediately for repeat evaluation.  Thank you. Susan Lopez , que se ve hoy en da para la depresin , dolor de [place] y Social research officer, government de [place] . Sus TC fueron normales y tus marcadores cardacos eran normales . Siga tomando motrin en casa como sea necesario para Conservation officer, historic buildings y el seguimiento con su mdico de cabecera dentro de 3 das para el cuidado continuo de su dolor de cabeza, Social research officer, government en el pecho , y la depresin. Si los sntomas empeoran , vuelve al servicio de urgencias de inmediato para la evaluacin de repeticin. Gracias.  La depresin es un sentimiento de tristeza, decaimiento, sufrimiento espiritual, melancola, pesimismo o vaco. Hay dos tipos de depresin:  Tristeza o afliccin normal. Ocurre despus de un suceso que Armed forces logistics/support/administrative officer. Generalmente desaparece sin tratamiento dentro de las 2 semanas. Despus de perder un ser querido (duelo), la tristeza o afliccin normales pueden durar ms de [employer]. Generalmente mejora al pasar [employer].  Depresin clnica. Dura ms que la tristeza o afliccin normal. Impide realizar las cosas habituales de la vida. Causa dificultades para actuar en el hogar, el trabajo o la escuela. Puede afectar las relaciones con los dems. A menudo requiere tratamiento. SOLICITE AYUDA DE INMEDIATO SI:  Tiene pensamientos acerca de lastimarse o daar a Producer, television/film/video.  Pierde el contacto con la realidad (sntomas psicticos). Puede ocurrirle que:  Vea o escuche cosas que no existen.  Tenga creencias falsas sobre su vida o sobre las personas que lo rodean.  Los [employer] produzcan  trastornos. ASEGRESE DE QUE:  Comprende estas instrucciones.  Controlar su afeccin.  Recibir ayuda de inmediato si no mejora o si empeora. Document Released: 02/01/2011 Document Revised: 03/03/2014 [facility] Patient Information 2015 [place]. This information is not intended to replace advice given to you by your health care provider. Make sure you discuss any questions you have with your health care provider. Dolor de Pensions consultant, preguntas frecuentes y sus respuestas (Headaches, Frequently Asked Questions) CEFALEAS MIGRAOSAS P: Qu es la migraa? Qu la ocasiona? Cmo puedo tratarla? R: En general, la migraa comienza como un dolor apagado. Luego progresa hacia un dolor, constante, punzante y como un latido. Sentir Copy las sienes. Podr sentir Aeronautical engineer parte anterior o posterior de la cabeza, o en uno o ambos lados. El dolor suele estar acompaado de una combinacin de:  Nuseas.  Vmitos.  Sensibilidad a la luz y los ruidos. Algunas personas (un 15%) experimentan un aura (ver abajo) antes de un ataque. La causa de la migraa se debe a reacciones qumicas del cerebro. El tratamiento para la migraa puede incluir medicamentos de [place]. Tambin puede incluir tcnicas de [place]. Estas incluyen entrenamientos para la relajacin y biorretroalimentacin.  P: Qu es un aura? R: Alrededor del 15% de las personas con migraa tiene un "aura". Es una seal de sntomas neurolgicos que ocurren antes de un dolor de cabeza por migraas. Podr ver lneas onduladas o irregulares, puntos o luces parpadeantes. Podr experimentar visin de tnel o puntos ciegos en uno o ambos ojos. El aura puede incluir alucinaciones visuales o [phone] (  algo que se imagina). Puede incluir trastornos en el olfato (como olores extraos), el tacto o el gusto. Entre otros sntomas se incluyen:  Adormecimiento.  Sensacin de hormigueo.  Dificultad para recordar o Insurance risk surveyor. Estos episodios neurolgicos pueden durar hasta 60 minutos. Los sntomas desaparecern a medida que el dolor de cabeza comience. P:Qu es un disparador? R: Ciertos factores fsicos o Best boy a "disparar" una migraa. Estos son:  Alimentos.  Cambios hormonales.  Clima.  Estrs. Es importante recordar que los disparadores son diferentes entre si. Para ayudar a prevenir ataques de migraas, necesitar descubrir cules son los Engineer, civil (consulting). Lleve un diario sobre sus dolores de [place]. Este es un buen modo para descubrir los disparadores. El Visual merchandiser en el momento de hablar con el profesional acerca de su enfermedad. P: El clima afecta en las migraas? R: La luz solar, el calor, la humedad y lo cambios drsticos en la presin Doctor, hospital a, o "disparar" un ataque de migraa en [employer]. Pero estudios han demostrado que el clima no acta como disparador para todas las personas con [place]. P: Cul es la relacin entre la migraa y la hormonas? R: Las hormonas inician y [place] funciones corporales. Las hormonas [employer] balance en el cuerpo dentro de los constantes cambios de [place]. Algunas veces, el nivel de hormonas en el cuerpo se desbalancea. Por ejemplo, durante la menstruacin, el embarazo o la [place]. Pueden ser la causa de un ataque de migraa. De hecho, alrededor de tres cuartos de las mujeres con migraa informan que sus ataques estn relacionados con el ciclo menstrual.  P: Aumenta el riesgo de sufrir un choque cardaco en las personas que padecen migraa? R: La probabilidad de que un ataque de migraa ocasione un ataque cardaco es muy remota. Esto no quiere Google persona que sufre de migraa no pueda tener un ataque cardaco asociado con ella. En las personas menores de 40 aos, el factor ms comn para un ataque es la [place]. Pero durante la vida de una persona, la ocurrencia de  un dolor de cabeza por migraa est asociada con una reduccin en el riesgo de morir por un ataque cerebrovascular.  P: Cules son los medicamentos para la migraa? R: La medicacin precisa se [place] para tratar el dolor de cabeza una vez que ha comenzado. Son ejemplos, medicamentos de [place], desinflamatorios sin esteroides, ergotamnicos y triptanos.  P: Qu son los triptanos? R: Lo triptanos son Rodena Piety clase de medicamentos abortivos. Son especficos para tratar este problema. Los triptanos son vasoconstrictores. Moderan algunas reacciones qumicas del cerebro. Los triptanos trabajan como receptores del cerebro. Ayudan a Doctor, general practice de un neurotransmisor denominado serotonina. Se cree que las fluctuaciones en los [place] de serotonina son la causa principal de la migraa.  P: Son [employer] de venta libre para la migraa? R: Los medicamentos de [employer] pueden ser efectivos para Public house manager dolores leves a moderados y los sntomas asociados a la [place]. Pero deber consultar a un mdico antes de Oncologist tratamiento para la migraa.  P: Cules son los medicamentos de prevencin de la migraa? R: Se suele denominar tratamiento "profilctico" a los medicamentos para la prevencin de la migraa. Se utilizan para reducir Scientist, forensic, gravedad y duracin de los ataques de [place]. Son ejemplos de medicamentos de prevencin: antiepilpticos, antidepresivos, bloqueadores beta, bloqueadores de los canales de calcio y medicamentos antiinflamatorios sin esteroides. P:  Por  qu se utilizan anticonvulsivantes para tratar la migraa? R: Durante los ltimos aos, ha habido un creciente inters en las drogas antiepilpticas para la prevencin de la migraa. A menudo se los conoce como "anticonvulsivantes". La epilepsia y la migraa suceden por reacciones similares en el cerebro.  P:  Por qu se utilizan antidepresivos para tratar la migraa? R: Los antidepresivos  tpicamente se [place] para tratar a las personas con depresin. Pueden reducir la frecuencia de la migraa a travs de la regulacin de los niveles qumicos, como la serotonina, en el cerebro.  P:  Por qu se utilizan terapias alternativas para tratar la migraa? R: El trmino "terapias alternativas" suelen utilizarse para describir los tratamientos que se considera que estn por fuera de Doctor, hospital la medicina occidental convencional. Son ejemplos de las terapias alternativas: la acupuntura, la acupresin y el yoga. Otra terapia alternativa comn es la terapia herbal. Se cree que algunas hierbas ayudan a [employer] de [place]. Siempre consulte con [employer] acerca de las terapias alternativas antes de [place]. Algunos productos herbales contienen arsnico y [place] toxinas. DOLORES DE CABEZA POR TENSIN P: Qu es un dolor de cabeza por tensin? Qu lo ocasiona? Cmo puedo tratarlo? R: Los dolores de cabeza por tensin ocurren al azar. A menudo son el resultado de estrs temporario, ansiedad, fatiga o ira. Los sntomas Research scientist (physical sciences) en las sienes, una sensacin como de tener una banda alrededor de la cabeza (un dolor que "presiona"). Los sntomas pueden incluir una sensacin de [place], de presin y Writer de los msculos de la cabeza y el cuello. El dolor comienza en la frente, sienes o en la parte posterior de la cabeza y el cuello. El tratamiento para los dolores de cabeza por tensin puede incluir medicamentos de [place]. Tambin puede incluir tcnicas de [place] con entrenamientos para la relajacin y biorretroalimentacin. CEFALEA EN RACIMOS P: Qu es una cefalea en racimos? Qu la ocasiona? Cmo puedo tratarla? R: La cefalea en racimos toma su nombre debido a que los ataques vienen en grupos. El dolor aparece con poco o ningn aviso. Normalmente ocurre de un lado de la cabeza. Muchas veces el dolor viene acompaado de un lagrimeo u ojo rojo y goteo de la nariz del mismo  lado que Conservation officer, historic buildings. Se cree que la causa es una reaccin en las sustancias qumicas del cerebro. Se describe como el caso ms grave e intenso de cualquier tipo de dolor de cabeza. El tratamiento incluye medicamentos bajo receta y oxgeno. CEFALEA SINUSAL P: Qu es una cefalea sinusal? Qu la ocasiona? Cmo puedo tratarla? R: Cuando se inflama una cavidad en los huesos de la cara y el crneo (sinus) ocasiona un dolor localizado. Esta enfermedad generalmente es el resultado de una reaccin alrgica, un tumor o una infeccin. Si el dolor de cabeza est ocasionado por un bloqueo del sinus, como una infeccin, probablemente tendr [place]. Una imagen de rayos X confirmar el bloqueo del sinus. El tratamiento indicado por el mdico podr incluir antibiticos para la infeccin, y tambin antihistamnicos o descongestivos.  DOLOR DE CABEZA POR EFECTO "REBOTE" P: Qu es un dolor de cabeza por efecto "rebote"? Qu lo ocasiona? Cmo puedo tratarlo? R: Si se toman medicamentos para el dolor de cabeza muy a menudo puede llevar a la enfermedad conocida como "dolor de cabeza por rebote". Un patrn de abuso de medicamentos para el dolor de cabeza supone tomarlos ms de [employer] por semana o en cantidades excesivas. Esto significa ms que lo que indica el  envase o el mdico. Con los dolores de cabeza por rebote, los medicamentos no slo dejan de Best boy sino que adems comienzan a Electronics engineer dolores de [place]. Los mdicos tratan los dolores de cabeza por rebote mediante la disminucin del medicamento del que se ha abusado. A veces el medico podr sustituir gradualmente por un tipo diferente de tratamiento o medicacin. Dejar de consumirlo podra ser difcil. El abuso regular de un medicamento aumenta el potencial que se produzcan efectos secundarios graves. Consulte con un mdico si utiliza regularmente medicamentos para Conservation officer, historic buildings de cabeza ms de dos das por semana o ms de lo que indica el envase. PREGUNTAS Y  RESPUESTAS ADICIONALES P: Qu es la biorretroalimentacin? R: La biorretroalimentacin es un tratamiento de [place]. La biorretroalimentacin utiliza un equipamiento especial para controlar los movimientos involuntarios del cuerpo y las respuestas fsicas. La biorretroalimentacin controla:  Respiracin.  Pulso.  Latidos cardacos.  Temperatura.  Tensin muscular.  Actividad cerebrales. La biorretroalimentacin le ayudar a mejorar y Government social research officer sus ejercicios de Systems developer. Aprender a Chief Technology Officer las respuestas fsicas relacionadas con el estrs. Una vez que se dominan las tcnicas no necesitar ms el equipamiento. P: Son hereditarios los dolores de [place]? R: Segn algunas estimaciones, aproximadamente 28 millones de estadounidenses sufren migraa. Cuatro de cada cinco (80%) informan una historia familiar de migraa. Los investigadores no pueden asegurar si se trata de un problema gentico o Retail banker. A pesar de esto, un nio tiene 50% de probabilidades de sufrir migraa si uno de sus padres la sufre. El nio tiene un 75% de probabilidades si ambos padres la sufren.  P. Puede un nio tener migraa? R: En el momento de ingresar a la escuela secundaria, la mayora de los jvenes han experimentado algn tipo de cefalea. Algunos abordajes o medicamentos seguros y Location manager las cefaleas o detenerlas luego de que han comenzado.  Marita Snellen tipo de especialista debe ver para diagnosticar y tratar una cefalea? R: Comience con su mdico de cabecera. Luther Hearing de su experiencia y abordaje de las cefaleas. Comente los mtodos de clasificacin, diagnstico y [place]. El profesional decidir si lo derivar a Teaching laboratory technician, segn los sntomas u otras enfermedades. El hecho de sufrir diabetes, Set designer, Social research officer, government, puede requerir un abordaje ms complejo. [place] ([place] para las [place]) Geneticist, molecular, a pedido, Furniture conservator/restorer lista de los  mdicos que son miembros de [place]. Document Released: 09/29/2008 Document Revised: 01/09/2012 [facility] Patient Information 2015 [place]. This information is not intended to replace advice given to you by your health care provider. Make sure you discuss any questions you have with your health care provider. Dolor de pecho (no especfico) (Chest Pain (Nonspecific)) Suele ser difcil diagnosticar la causa del dolor de [place]. Siempre hay una posibilidad de que el dolor podra estar relacionado con algo grave, como un ataque al corazn o un cogulo sanguneo en los pulmones. Debe concurrir a las visitas de control con el mdico. CUIDADOS EN EL HOGAR  Si le dieron antibiticos, tmelos como se lo haya indicado el mdico. Finalice el medicamento, aunque comience a Sports administrator.  [address] no haga actividades que provoquen dolor de [place]. Contine con las actividades fsicas como se lo haya indicado el mdico.  No use productos que contengan tabaco, que incluyen cigarrillos, tabaco para Higher education careers adviser y Psychologist, sport and exercise.  Evite el consumo de alcohol.  Tome los medicamentos solamente como se lo haya indicado el mdico.  Siga las sugerencias del mdico en lo que  respecta a ms pruebas, si el dolor de pecho no desaparece.  Concurra a todas las visitas que concert con el mdico. SOLICITE AYUDA SI:  El dolor de pecho no desaparece, incluso despus del tratamiento.  Tiene una erupcin cutnea con ampollas en el pecho.  Tiene fiebre. SOLICITE AYUDA DE INMEDIATO SI:   Aumenta el dolor de pecho o el dolor se irradia hacia el brazo, el cuello, la [place], la espalda o el vientre (abdomen).  Le falta el aire.  Tose ms de lo normal o tose con sangre.  Siente un dolor muy intenso en la espalda o el vientre.  Tiene malestar estomacal (nuseas) o vomita.  Se siente muy dbil.  Pierde el conocimiento (se desmaya).  Tiene escalofros. Esto es Engineer, maintenance (IT).  No espere a ver que los problemas desaparezcan. Llame a los servicios de emergencia locales (911 en [place]). No conduzca por sus propios medios [employer] hospital. ASEGRESE DE QUE:   Comprende estas instrucciones.  Controlar su afeccin.  Recibir ayuda de inmediato si no mejora o si empeora. Document Released: 01/13/2009 Document Revised: 10/22/2013 [facility] Patient Information 2015 [place]. This information is not intended to replace advice given to you by your health care provider. Make sure you discuss any questions you have with your health care provider.

## 2014-08-05 NOTE — ED Provider Notes (Addendum)
Patient signed out with multiple complaints including headache chest pain and back pain. Dr. Phineas Real stent at 12:15 AM, headache medication given plan was to discharge patient. Upon my evaluation the patient she states her chest pain is new, radiates to her back with left upper and lower extremity numbness tingling. This is concerning for possible dissection, however the patient is allergic to IV contrast. Patient also has post differentiation on both sides. We'll obtain study without contrast and reevaluate. Repeat troponin is negative.  CT of the chest without contrast as a dissection. Patient's pain is improved with the medication. We'll send her home prescription for the same and PCP followup. She has an appointment in December was encouraged to get an earlier appointment this week.  I saw and evaluated the patient, reviewed the resident's note and I agree with the findings and plan.   EKG Interpretation None        Everlene Balls, MD 08/05/14 [id number]  Everlene Balls, MD 08/05/14 [id number]

## 2014-08-05 NOTE — ED Notes (Signed)
Patient transported to CT 

## 2014-08-05 NOTE — ED Provider Notes (Signed)
Repeat troponin negative.  CT head and chest also negative.  Patient feels better after medications and admits to some depression.  She has no SI/HI and is not seeking care from psych right now.  Advised to follow up within 3 days for care from PCP.  Medical screening examination/treatment/procedure(s) were conducted as a shared visit with non-physician practitioner(s) and myself.  I personally evaluated the patient during the encounter.   EKG Interpretation None        Everlene Balls, MD 08/05/14 [id number]

## 2014-08-12 ENCOUNTER — Encounter: Payer: Self-pay | Admitting: Internal Medicine

## 2014-08-12 ENCOUNTER — Ambulatory Visit: Payer: Self-pay | Attending: Internal Medicine | Admitting: Internal Medicine

## 2014-08-12 VITALS — BP 123/80 | HR 84 | Temp 98.5°F | Ht 63.0 in | Wt 168.0 lb

## 2014-08-12 DIAGNOSIS — F329 Major depressive disorder, single episode, unspecified: Secondary | ICD-10-CM | POA: Insufficient documentation

## 2014-08-12 DIAGNOSIS — F32A Depression, unspecified: Secondary | ICD-10-CM | POA: Insufficient documentation

## 2014-08-12 DIAGNOSIS — Z79899 Other long term (current) drug therapy: Secondary | ICD-10-CM | POA: Insufficient documentation

## 2014-08-12 DIAGNOSIS — Z791 Long term (current) use of non-steroidal anti-inflammatories (NSAID): Secondary | ICD-10-CM | POA: Insufficient documentation

## 2014-08-12 DIAGNOSIS — E785 Hyperlipidemia, unspecified: Secondary | ICD-10-CM | POA: Insufficient documentation

## 2014-08-12 DIAGNOSIS — I10 Essential (primary) hypertension: Secondary | ICD-10-CM | POA: Insufficient documentation

## 2014-08-12 DIAGNOSIS — D509 Iron deficiency anemia, unspecified: Secondary | ICD-10-CM | POA: Insufficient documentation

## 2014-08-12 DIAGNOSIS — M1990 Unspecified osteoarthritis, unspecified site: Secondary | ICD-10-CM | POA: Insufficient documentation

## 2014-08-12 MED ORDER — ATORVASTATIN CALCIUM 20 MG PO TABS: 20.0000 mg | ORAL_TABLET | Freq: Every day | ORAL | Status: DC

## 2014-08-12 MED ORDER — IBUPROFEN 800 MG PO TABS: 800.0000 mg | ORAL_TABLET | Freq: Three times a day (TID) | ORAL | Status: DC | PRN

## 2014-08-12 MED ORDER — PAROXETINE HCL 20 MG PO TABS: 20.0000 mg | ORAL_TABLET | Freq: Every day | ORAL | Status: DC

## 2014-08-12 NOTE — Patient Instructions (Signed)
Plan de alimentacin DASH (DASH Eating Plan) DASH es la sigla en ingls de "Enfoques Alimentarios para Detener la Hipertensin". El plan de alimentacin DASH ha demostrado bajar la presin arterial elevada (hipertensin). Los beneficios adicionales para la salud pueden incluir la disminucin del riesgo de diabetes mellitus tipo2, enfermedades cardacas e ictus. Este plan tambin puede ayudar a Horticulturist, commercial. QU DEBO SABER ACERCA DEL PLAN DE ALIMENTACIN DASH? Para el plan de alimentacin DASH, seguir las siguientes pautas generales:  Elija los alimentos con un valor porcentual diario de sodio de menos del 5% (segn figura en la etiqueta del alimento).  Use hierbas o aderezos sin sal, en lugar de sal de mesa o sal marina.  Consulte al mdico o farmacutico antes de usar sustitutos de la sal.  Coma productos con bajo contenido de sodio, cuya etiqueta suele decir "bajo contenido de sodio" o "sin agregado de sal".  Coma alimentos frescos.  Coma ms verduras, frutas y productos lcteos con bajo contenido de [place].  Elija los cereales integrales. Busque la palabra "integral" en Equities trader de la lista de ingredientes.  Elija el pescado y el pollo o el pavo sin piel ms a menudo que las carnes rojas. Limite el consumo de pescado, carne de ave y carne a 6onzas (170g) por Training and development officer.  Limite el consumo de dulces, postres, azcares y bebidas azucaradas.  Elija las grasas saludables para el corazn.  Limite el consumo de queso a 1onza (28g) por Training and development officer.  Consuma ms comida casera y menos de restaurante, de buf y comida rpida.  Limite el consumo de alimentos fritos.  Cocine los alimentos utilizando mtodos que no sean la fritura.  Limite las verduras enlatadas. Si las consume, enjuguelas bien para disminuir el sodio.  Cuando coma en un restaurante, pida que preparen su comida con menos sal o, en lo posible, sin nada de sal. QU ALIMENTOS PUEDO COMER? Pida ayuda a un nutricionista para  conocer las necesidades calricas individuales. Cereales Pan de salvado o integral. Arroz integral. Pastas de salvado o integrales. Quinua, trigo burgol y cereales integrales. Cereales con bajo contenido de sodio. Tortillas de harina de maz o de salvado. Pan de maz integral. Galletas saladas integrales. Galletas con bajo contenido de [place]. Vegetales Verduras frescas o congeladas (crudas, al vapor, asadas o grilladas). Jugos de tomate y verduras con contenido bajo o reducido de sodio. Pasta y salsa de tomate con contenido bajo o [place]. Verduras enlatadas con bajo contenido de sodio o reducido de sodio.  Lambert Mody Lambert Mody frescas, en conserva (en su jugo natural) o frutas congeladas. Carnes y otros productos con protenas Carne de res molida (al 85% o ms [place]), carne de res de animales alimentados con pastos o carne de res sin la grasa. Pollo o pavo sin piel. Carne de pollo o de [place]. Cerdo sin la grasa. Todos los pescados y frutos de mar. Huevos. Porotos, guisantes o lentejas secos. Frutos secos y semillas sin sal. Frijoles enlatados sin sal. Lcteos Productos lcteos con bajo contenido de grasas, como [place] o al 1%, quesos reducidos en grasas o al 2%, ricota con bajo contenido de grasas o [employer], o yogur natural con bajo contenido de [place]. Quesos con contenido bajo o reducido de sodio. Grasas y Naval architect en barra que no contengan grasas trans. Mayonesa y alios para ensaladas livianos o reducidos en grasas (reducidos en sodio). [place]. Aceites de crtamo, oliva o canola. [place] natural de man o almendra. Otros Palomitas de maz y pretzels sin sal.  Los artculos mencionados arriba pueden no ser [employer] Company de las bebidas o los alimentos recomendados. Comunquese con el nutricionista para conocer ms opciones. QU ALIMENTOS NO SE RECOMIENDAN? Cereales Pan blanco. Pastas blancas. Arroz blanco. Pan de maz refinado. Bagels y  croissants. Galletas saladas que contengan grasas trans. Vegetales Vegetales con crema o fritos. Verduras en [place]. Verduras enlatadas comunes. Pasta y salsa de tomate en lata comunes. Jugos comunes de tomate y de verduras. Lambert Mody Frutas secas. Fruta enlatada en almbar liviano o espeso. Jugo de frutas. Carnes y otros productos con protenas Cortes de carne con Lobbyist. Costillas, alas de pollo, tocineta, salchicha, mortadela, salame, chinchulines, tocino, perros calientes, salchichas alemanas y embutidos envasados. Frutos secos y semillas con sal. Frijoles con sal en lata. Lcteos Leche entera o al 2%, crema, mezcla de [place] y crema, y queso crema. Yogur entero o endulzado. Quesos o queso azul con alto contenido de Physicist, medical. Cremas no lcteas y coberturas batidas. Quesos procesados, quesos para untar o cuajadas. Condimentos Sal de cebolla y ajo, sal condimentada, sal de mesa y sal marina. Salsas en lata y envasadas. Salsa [place]. Salsa trtara. Salsa barbacoa. Salsa teriyaki. Salsa de soja, incluso la que tiene contenido reducido de [place]. Salsa de carne. Salsa de pescado. Salsa de [place]. Salsa rosada. Rbano picante. Ketchup y mostaza. Saborizantes y tiernizantes para carne. Caldo en cubitos. Salsa picante. Salsa tabasco. Adobos. Aderezos para tacos. Salsas. Grasas y aceites [place], [place] en barra, [place] de [place], [place], [place] clarificada y Wendee Copp de tocino. Aceites de coco, de palmiste o de palma. Aderezos comunes para ensalada. Otros Pickles y [place]. Palomitas de maz y pretzels con sal. Los artculos mencionados arriba pueden no ser [employer] Company de las bebidas y los alimentos que se Higher education careers adviser. Comunquese con el nutricionista para obtener ms informacin. DNDE Dolan Amen MS INFORMACIN? [place], del Pulmn y de la Sangre (National Heart, Lung, and [place]):  [email or web address] Document Released: 10/06/2011 Document Revised: 03/03/2014 [facility] Patient Information 2015 [place], [place]. This information is not intended to replace advice given to you by your health care provider. Make sure you discuss any questions you have with your health care provider. Hipertensin (Hypertension) La hipertensin, conocida comnmente como presin arterial alta, se produce cuando la sangre bombea en las arterias con mucha fuerza. Las arterias son los vasos sanguneos que transportan la sangre desde el corazn hacia todas las partes del cuerpo. Una lectura de la presin arterial consiste en un nmero ms alto sobre un nmero ms bajo, por ejemplo, 110/72. El nmero ms alto (presin sistlica) corresponde a la presin interna de las arterias cuando el corazn [place]. El nmero ms bajo (presin diastlica) corresponde a la presin interna de las arterias cuando el corazn se relaja. En condiciones ideales, la presin arterial debe ser inferior a 120/80. La hipertensin fuerza al corazn a trabajar ms para Herbalist. Las arterias pueden estrecharse o ponerse rgidas. La hipertensin conlleva el riesgo de enfermedad cardaca, ictus y otros problemas.  [place] de riesgo de hipertensin son controlables, pero otros no lo son.  [employer] factores de riesgo que usted no puede Chief Technology Officer, se incluyen:   Manufacturing systems engineer. El riesgo es mayor para las Retail banker.  La edad. Los riesgos aumentan con la edad.  El sexo. Antes de los 45aos, los hombres corren ms [employer]. Despus de los 65aos, las mujeres corren ms [employer]. [employer]Source factores de [place]  que usted IT consultant, se incluyen:  No hacer la cantidad suficiente de actividad fsica o ejercicio.  Tener sobrepeso.  Consumir mucha grasa, azcar, caloras o sal en la dieta.  Beber alcohol en exceso. SIGNOS Y  SNTOMAS Por lo general, la hipertensin no causa signos o sntomas. La hipertensin demasiado alta (crisis hipertensiva) puede causar dolor de cabeza, ansiedad, falta de aire y hemorragia nasal. DIAGNSTICO  Para detectar si usted tiene hipertensin, el mdico le medir la presin arterial mientras est sentado, con el brazo levantado a la altura del corazn. Debe medirla al [facility] veces en el mismo brazo. Determinadas condiciones pueden causar una diferencia de presin arterial entre el brazo izquierdo y Insurance underwriter. El hecho de tener una sola lectura de la presin arterial ms alta que lo normal no significa que Stage manager. En el caso de tener una lectura de la presin arterial con un valor alto, pdale al mdico que la verifique nuevamente. [place] hipertensin arterial incluye hacer cambios en el estilo de vida y, posiblemente, tomar medicamentos. Un estilo de vida saludable puede ayudar a bajar la presin arterial alta. Quiz deba cambiar algunos hbitos. Los Levi Strauss en el estilo de vida pueden incluir:  Seguir la dieta DASH. Esta dieta tiene un alto contenido de frutas, verduras y Psychologist, prison and probation services. Incluye poca cantidad de sal, carnes rojas y azcares agregados.  Hacer al menos 2horas de actividad fsica enrgica todas las semanas.  Perder peso, si es necesario.  No fumar.  Limitar el consumo de bebidas alcohlicas.  Aprender formas de reducir el estrs. Si los cambios en el estilo de vida no son suficientes para Child psychotherapist la presin arterial, el mdico puede recetarle medicamentos. Quiz necesite tomar ms de uno. Trabaje en conjunto con su mdico para comprender los riesgos y los beneficios. INSTRUCCIONES PARA EL CUIDADO EN EL HOGAR  Haga que le midan de nuevo la presin arterial segn las indicaciones del [place] los medicamentos solamente como se lo haya indicado el mdico. Siga cuidadosamente las indicaciones. Los  medicamentos para la presin arterial deben tomarse segn las indicaciones. Los medicamentos pierden eficacia al omitir las dosis. El hecho de omitir las dosis tambin [place] el riesgo de otros problemas.  No fume.  Contrlese la presin arterial en su casa segn las indicaciones del mdico. SOLICITE ATENCIN MDICA SI:   Piensa que tiene una reaccin alrgica a los medicamentos.  Tiene mareos o dolores de cabeza con Scientist, research (physical sciences).  Tiene hinchazn en los tobillos.  Tiene problemas de visin. SOLICITE ATENCIN MDICA DE INMEDIATO SI:  Siente un dolor de cabeza intenso o confusin.  Siente debilidad inusual, adormecimiento o que Geneticist, molecular.  Siente dolor intenso en el pecho o en el abdomen.  Vomita repetidas veces.  Tiene dificultad para respirar. ASEGRESE DE QUE:   Comprende estas instrucciones.  Controlar su afeccin.  Recibir ayuda de inmediato si no mejora o si empeora. Document Released: 10/17/2005 Document Revised: 03/03/2014 [facility] Patient Information 2015 [place], [place]. This information is not intended to replace advice given to you by your health care provider. Make sure you discuss any questions you have with your health care provider.

## 2014-08-12 NOTE — Progress Notes (Signed)
Pt is here following up on her hyperlipidemia and HTN. Pt is c/o a mild headaches. Pt was recently in the ED with chest and back pain w/ SOB.

## 2014-08-12 NOTE — Progress Notes (Signed)
Patient ID: Susan Lopez, female   DOB: January 27, 1961, 53 y.o.   MRN: [id number]   Susan Lopez, is a 53 y.o. female  BSW:[id number]  GYK:[id number]  DOB - January 26, 1961  Chief Complaint  Patient presents with  . Follow-up        Subjective:   Susan Lopez is a 53 y.o. female here today for a follow up visit. Patient has medical history significant for iron deficiency anemia, hypertension, arthritis, gallstone today for follow-up. She was recently seen in the ED for chest and back pain with shortness of breath , CT head and chest were negative. Patient felt better in the ED , admitted that she was depressed. She ruled out of MI , EKG shows sinus rhythm with no ST segment elevation or depression, troponin were negative. Patient was discharged to follow-up in our clinic today. She claims headache is less. She denies any suicidal ideation or thoughts. She agrees to begin treatment for that depression but she would rather not see a psychiatrist for now. Patient has No chest pain today, No abdominal pain - No Nausea, No new weakness tingling or numbness, No Cough - SOB.  Problem  Depression (Emotion)  Dyslipidemia    ALLERGIES: Allergies  Allergen Reactions  . Hydrocodone Nausea And Vomiting  . Oxycodone Rash    PAST MEDICAL HISTORY: Past Medical History  Diagnosis Date  . Iron deficiency anemia   . Hypertension   . Arthritis   . Gallstones   . Hemorrhoids, external 10-21-11    occ. bothersome  . Calculus of gallbladder with acute and chronic cholecystitis without obstruction, s/p lap chole [id number] 09/19/2011  . Bone lesion 10/04/2013  . Mammogram abnormal 10/04/2013    MEDICATIONS AT HOME: Prior to Admission medications   Medication Sig Start Date End Date Taking? Authorizing Provider  acetaminophen (TYLENOL) 500 MG tablet Take 1 tablet (500 mg total) by mouth every 6 (six) hours as needed for pain. 07/08/13  Yes Shanker Kristeen Mans, MD  atorvastatin  (LIPITOR) 20 MG tablet Take 1 tablet (20 mg total) by mouth daily. 08/12/14  Yes Tresa Garter, MD  docusate sodium (COLACE) 100 MG capsule Take 1 capsule (100 mg total) by mouth 2 (two) times daily as needed for mild constipation. 10/16/13  Yes Theodis Blaze, MD  ferrous sulfate 325 (65 FE) MG tablet Take 1 tablet (325 mg total) by mouth daily with breakfast. 07/17/14  Yes Sharee Sturdy Essie Christine, MD  gabapentin (NEURONTIN) 100 MG capsule Take 1 capsule (100 mg total) by mouth 2 (two) times daily. 07/17/14  Yes Tresa Garter, MD  ibuprofen (ADVIL,MOTRIN) 800 MG tablet Take 1 tablet (800 mg total) by mouth every 8 (eight) hours as needed for headache. 08/05/14  Yes Everlene Balls, MD  lisinopril (PRINIVIL,ZESTRIL) 10 MG tablet Take 10 mg by mouth at bedtime.   Yes Historical Provider, MD  meloxicam (MOBIC) 15 MG tablet Take 1 tablet (15 mg total) by mouth daily. 07/17/14  Yes Tresa Garter, MD  Multiple Vitamin (MULTIVITAMIN WITH MINERALS) TABS tablet Take 1 tablet by mouth daily.   Yes Historical Provider, MD  omeprazole (PRILOSEC) 40 MG capsule Take 1 capsule (40 mg total) by mouth daily. 07/17/14  Yes Tresa Garter, MD  Polyvinyl Alcohol-Povidone (REFRESH OP) Place 1 drop into both eyes 2 (two) times daily.   Yes Historical Provider, MD  PARoxetine (PAXIL) 20 MG tablet Take 1 tablet (20 mg total) by mouth daily. 08/12/14   Tresa Garter, MD  Objective:   Filed Vitals:   08/12/14 1043  BP: 123/80  Pulse: 84  Temp: 98.5 F (36.9 C)  TempSrc: Oral  Height: 5\' 3"  (1.6 m)  Weight: 168 lb (76.204 kg)    Exam General appearance : Awake, alert, not in any distress. Speech Clear. Not toxic looking HEENT: Atraumatic and Normocephalic, pupils equally reactive to light and accomodation Neck: supple, no JVD. No cervical lymphadenopathy.  Chest:Good air entry bilaterally, no added sounds  CVS: S1 S2 regular, no murmurs.  Abdomen: Bowel sounds present, Non tender and not  distended with no gaurding, rigidity or rebound. Extremities: B/L Lower Ext shows no edema, both legs are warm to touch Neurology: Awake alert, and oriented X 3, CN II-XII intact, Non focal  Data Review Lab Results  Component Value Date   HGBA1C 5.6 12/20/2013   HGBA1C 5.4 06/10/2013     Assessment & Plan   1. Dyslipidemia  - Lipid panel - atorvastatin (LIPITOR) 20 MG tablet; Take 1 tablet (20 mg total) by mouth daily.  Dispense: 90 tablet; Refill: 3  To address this please limit saturated fat to no more than 7% of your calories, limit cholesterol to 200 mg/day, increase fiber and exercise as tolerated. If needed we may add another cholesterol lowering medication to your regimen.   2. Depression (emotion)  - PARoxetine (PAXIL) 20 MG tablet; Take 1 tablet (20 mg total) by mouth daily.  Dispense: 30 tablet; Refill: 3  Patient will see our Education officer, museum for counseling and support.  Return in about 3 months (around 11/12/2014), or if symptoms worsen or fail to improve, for Follow up HTN, Follow up Pain and comorbidities.  The patient was given clear instructions to go to ER or return to medical center if symptoms don't improve, worsen or new problems develop. The patient verbalized understanding. The patient was told to call to get lab results if they haven't heard anything in the next week.   This note has been created with Surveyor, quantity. Any transcriptional errors are unintentional.    Angelica Chessman, MD, [place], [place], [place] and [place], [place]   08/12/2014, 11:44 AM

## 2014-08-13 LAB — LIPID PANEL
Cholesterol: 135 mg/dL (ref 0–200)
HDL: 40 mg/dL (ref >39)
Total CHOL/HDL Ratio: 3.4 Ratio
Triglycerides: 463 mg/dL — ABNORMAL HIGH (ref <150)

## 2014-08-21 ENCOUNTER — Other Ambulatory Visit: Payer: Self-pay | Admitting: Internal Medicine

## 2014-08-21 DIAGNOSIS — Z1231 Encounter for screening mammogram for malignant neoplasm of breast: Secondary | ICD-10-CM

## 2014-08-29 ENCOUNTER — Other Ambulatory Visit: Payer: Self-pay | Admitting: Obstetrics and Gynecology

## 2014-08-29 DIAGNOSIS — Z1231 Encounter for screening mammogram for malignant neoplasm of breast: Secondary | ICD-10-CM

## 2014-09-01 ENCOUNTER — Encounter: Payer: Self-pay | Admitting: Internal Medicine

## 2014-09-15 ENCOUNTER — Ambulatory Visit (HOSPITAL_COMMUNITY): Payer: Self-pay

## 2014-10-13 ENCOUNTER — Ambulatory Visit: Payer: Self-pay

## 2014-10-22 ENCOUNTER — Encounter (HOSPITAL_COMMUNITY): Payer: Self-pay

## 2014-10-22 ENCOUNTER — Other Ambulatory Visit (HOSPITAL_COMMUNITY): Payer: Self-pay | Admitting: *Deleted

## 2014-10-22 ENCOUNTER — Ambulatory Visit (HOSPITAL_COMMUNITY)
Admission: RE | Admit: 2014-10-22 | Discharge: 2014-10-22 | Disposition: A | Discharge status: Home / Self Care | Payer: Self-pay | Source: Ambulatory Visit | Attending: Obstetrics and Gynecology | Admitting: Obstetrics and Gynecology

## 2014-10-22 VITALS — BP 118/76 | Temp 98.5°F | Ht 60.0 in | Wt 170.6 lb

## 2014-10-22 DIAGNOSIS — Z1231 Encounter for screening mammogram for malignant neoplasm of breast: Secondary | ICD-10-CM

## 2014-10-22 DIAGNOSIS — N644 Mastodynia: Secondary | ICD-10-CM

## 2014-10-22 DIAGNOSIS — Z1239 Encounter for other screening for malignant neoplasm of breast: Secondary | ICD-10-CM

## 2014-10-22 NOTE — Progress Notes (Signed)
Complaints of stabbing left outer breast pain that comes and goes. Patient rates pain at a 6 out of 10.  Pap Smear:  Pap smear completed today. Patients last Pap smear was 04/16/2014 at [facility] and normal. Patient had a previous Pap smear 08/22/2013 at [facility] that was normal with negative HPV. Per patient has no history of an abnormal Pap smear. Last two Pap smear results are in EPIC.  Physical exam: Breasts Breasts symmetrical. No skin abnormalities bilateral breasts. No nipple retraction bilateral breasts. No nipple discharge bilateral breasts. No lymphadenopathy. No lumps palpated bilateral breasts. Complaints of left outer breast pain on exam. Referred patient to the [facility] for diagnostic mammogram. Appointment scheduled for Friday, November 07, 2013 at 1100.         Pelvic/Bimanual   Ext Genitalia No lesions, no swelling and no discharge observed on external genitalia.         Vagina Vagina pink and normal texture. No lesions or discharge observed in vagina.          Cervix Cervix is present. Cervix pink and of normal texture. No discharge observed.     Uterus Uterus is present and palpable. Uterus in normal position and normal size.        Adnexae Bilateral ovaries present and palpable. No tenderness on palpation.          Rectovaginal No rectal exam completed today since patient had no rectal complaints. No skin abnormalities observed on exam.

## 2014-10-22 NOTE — Patient Instructions (Addendum)
Explained to Susan Lopez how to perform BSE and gave educational materials to take home. Patient did not need a Pap smear today due to last Pap smear was 04/16/2014. Let her know [facility]CP will cover Pap smears every 3 years unless has a history of abnormal Pap smears and due to having a previous Pap smear 08/22/2013 that was normal and negative for HPV that her next Pap smear will be due in four years. Referred patient to the [facility] for diagnostic mammogram. Appointment scheduled for Friday, November 07, 2013 at 1100. Patient aware of appointment and will be there. Susan Lopez verbalized understanding.  Nayan Proch, Arvil Chaco, RN 10:05 AM

## 2014-11-07 ENCOUNTER — Ambulatory Visit
Admission: RE | Admit: 2014-11-07 | Discharge: 2014-11-07 | Disposition: A | Discharge status: Home / Self Care | Payer: No Typology Code available for payment source | Source: Ambulatory Visit | Attending: Obstetrics and Gynecology | Admitting: Obstetrics and Gynecology

## 2014-11-07 DIAGNOSIS — N644 Mastodynia: Secondary | ICD-10-CM

## 2014-11-20 ENCOUNTER — Ambulatory Visit: Payer: Self-pay | Admitting: Internal Medicine

## 2014-12-11 ENCOUNTER — Encounter: Payer: Self-pay | Admitting: Internal Medicine

## 2014-12-11 ENCOUNTER — Ambulatory Visit: Payer: Self-pay | Attending: Internal Medicine | Admitting: Internal Medicine

## 2014-12-11 VITALS — BP 126/79 | HR 84 | Temp 98.7°F | Resp 16 | Ht 60.0 in | Wt 168.0 lb

## 2014-12-11 DIAGNOSIS — R519 Headache, unspecified: Secondary | ICD-10-CM

## 2014-12-11 DIAGNOSIS — G8929 Other chronic pain: Secondary | ICD-10-CM | POA: Insufficient documentation

## 2014-12-11 DIAGNOSIS — R51 Headache: Secondary | ICD-10-CM | POA: Insufficient documentation

## 2014-12-11 DIAGNOSIS — I10 Essential (primary) hypertension: Secondary | ICD-10-CM | POA: Insufficient documentation

## 2014-12-11 DIAGNOSIS — M546 Pain in thoracic spine: Secondary | ICD-10-CM | POA: Insufficient documentation

## 2014-12-11 DIAGNOSIS — E785 Hyperlipidemia, unspecified: Secondary | ICD-10-CM | POA: Insufficient documentation

## 2014-12-11 DIAGNOSIS — R0789 Other chest pain: Secondary | ICD-10-CM | POA: Insufficient documentation

## 2014-12-11 DIAGNOSIS — F32A Depression, unspecified: Secondary | ICD-10-CM

## 2014-12-11 DIAGNOSIS — F329 Major depressive disorder, single episode, unspecified: Secondary | ICD-10-CM | POA: Insufficient documentation

## 2014-12-11 MED ORDER — PAROXETINE HCL 20 MG PO TABS: 20.0000 mg | ORAL_TABLET | Freq: Every day | ORAL | Status: DC

## 2014-12-11 MED ORDER — ATORVASTATIN CALCIUM 20 MG PO TABS: 20.0000 mg | ORAL_TABLET | Freq: Every day | ORAL | Status: DC

## 2014-12-11 MED ORDER — MELOXICAM 15 MG PO TABS: 15.0000 mg | ORAL_TABLET | Freq: Every day | ORAL | Status: DC

## 2014-12-11 MED ORDER — FERROUS SULFATE 325 (65 FE) MG PO TABS: 325.0000 mg | ORAL_TABLET | Freq: Every day | ORAL | Status: DC

## 2014-12-11 MED ORDER — OMEPRAZOLE 40 MG PO CPDR: 40.0000 mg | DELAYED_RELEASE_CAPSULE | Freq: Every day | ORAL | Status: DC

## 2014-12-11 NOTE — Patient Instructions (Signed)
Opciones de alimentos para Therapist, nutritional de triglicridos (Food Choices to Lower Your Triglycerides) Los triglicridos son un tipo de grasas que se Chief Strategy Officer. Un nivel elevado de triglicridos puede aumentar el riesgo de padecer enfermedades cardacas e infartos. Si sus niveles de triglicridos son altos, los alimentos que se ingieren y los hbitos de alimentacin son Theatre stage manager. Elegir los alimentos adecuados puede ayudar a Therapist, nutritional de triglicridos.  [place]?  Baje de peso si es necesario.  Limite o evite el alcohol.  Llene la mitad del plato con vegetales y ensaladas de hojas verdes.  [place] a dos porciones por da. Elija frutas en lugar de jugos.  Ocupe un cuarto del plato con cereales integrales. Busque la palabra "integral" en Equities trader de la lista de ingredientes.  Llene un cuarto del plato con alimentos con protenas magras.  Disfrute de pescados grasos (como salmn, caballa, sardinas y atn) tres veces por semana.  [place] grasas saludables.  Limite los alimentos con alto contenido de almidn y Location manager.  Consuma ms comida casera y menos de restaurante, de buf y comida rpida.  Limite el consumo de alimentos fritos.  Cocine los alimentos utilizando mtodos que no sean la fritura.  Limite el consumo de grasas saturadas.  Verifique las listas de ingredientes para evitar alimentos con aceites parcialmente hidrogenados (grasas trans). QU ALIMENTOS PUEDO COMER?  Cereales Cereales integrales, como los panes de salvado o [place], las [place], los cereales y las pastas. Avena sin endulzar, trigo, [place], quinua o arroz integral. Tortillas de harina de maz o de salvado.  Vegetales Verduras frescas o congeladas (crudas, al vapor, asadas o grilladas). Ensaladas de hojas verdes. Fruits Frutas frescas, en conserva (en su jugo natural) o frutas congeladas. Carnes y otros productos con protenas Carne de  res molida (al 85% o ms [place]), carne de res de animales alimentados con pastos o carne de res sin la grasa. Pollo o pavo sin piel. Carne de pollo o de [place]. Cerdo sin la grasa. Todos los pescados y frutos de mar. Huevos. Porotos, guisantes o lentejas secos. Frutos secos o semillas sin sal. Frijoles secos o en lata sin sal. Lcteos Productos lcteos con bajo contenido de grasas, como [place] o al 1%, quesos reducidos en grasas o al 2%, ricota con bajo contenido de grasas o [employer], o yogur natural con bajo contenido de [place]. Grasas y Naval architect en barra que no contengan grasas trans. [place] y condimentos para ensaladas livianos o reducidos en grasas. [place]. Aceites de crtamo, oliva o canola. [place] natural de man o almendra. Los artculos mencionados arriba pueden no ser [employer] de las bebidas o los alimentos recomendados. Comunquese con el nutricionista para conocer ms opciones. QU ALIMENTOS NO SE RECOMIENDAN?  Cereales Pan blanco. Pastas blancas. Arroz blanco. Pan de maz. Bagels, pasteles y croissants. Galletas saladas que contengan grasas trans. Vegetales Papas blancas. Maz. Vegetales con crema o fritos. Verduras en [place]. Fruits Frutas secas. Fruta enlatada en almbar liviano o espeso. Jugo de frutas. Carnes y otros productos con protenas Cortes de carne con Lobbyist. Costillas, alas de pollo, tocineta, salchicha, mortadela, salame, chinchulines, tocino, perros calientes, salchichas alemanas y embutidos envasados. Lcteos Leche entera o al 2%, crema, mezcla de [place] y crema y queso crema. Yogur entero o endulzado. Quesos con toda su grasa. Cremas no lcteas y coberturas batidas. Quesos procesados, quesos para untar o cuajadas. [place] y [place] de  maz, azcares, miel y melazas. Caramelos. Mermelada y [place]. Chrissie Noa. Cereales endulzados. Galletas, pasteles, bizcochuelos, donas, muffins y helado. Grasas y  aceites [place], [place] en barra, [place] de [place], [place], [place] clarificada o grasa de tocino. Aceites de coco, de palmiste o de palma. Bebidas Alcohol. Bebidas endulzadas (como refrescos, limonadas y bebidas frutales o ponches). Los artculos mencionados arriba pueden no ser [employer] de las bebidas y los alimentos que se Higher education careers adviser. Comunquese con el nutricionista para recibir ms informacin. Document Released: 04/06/2010 Document Revised: 10/22/2013 [facility] Patient Information 2015 [place]. This information is not intended to replace advice given to you by your health care provider. Make sure you discuss any questions you have with your health care provider.

## 2014-12-11 NOTE — Progress Notes (Signed)
Patient ID: Susan Lopez, female   DOB: 1961-10-10, 54 y.o.   MRN: [id number]   Susan Lopez, is a 54 y.o. female  [id number]  [id number]  DOB - [birth year]  Chief Complaint  Patient presents with  . Follow-up        Subjective:   Susan Lopez is a 54 y.o. female here today for a follow up visit. Patient has history of hypertension, osteoarthritis, iron deficiency anemia and hyperlipidemia, here today for routine follow-up. Her major complaint today is back pain mostly between the shoulder blades, radiating to her chest, mostly severe at night, sometimes so severe that she has associated shortness of breath. She has had multiple imaging studies in the past including CT abdomen and pelvis which showed uterine fibroids. A remote MRI lumbar spine showed: "Bone marrow lesions at L3 and L4 are indeterminate.The L3 lesion is well defined and probably benign. Differential includes metastatic disease, myeloma, and benign hemangioma.At L4-5 there is moderate spinal stenosis due to facet and ligamentum flavum hypertrophy. Mild grade 1 slip L5 on S1 with facet degeneration causing stenosis of the canal and lateral recess bilaterally". There has not been a follow-up MRI or repeat imaging studies since then. Patient also needs refill of all her medications. She does not smoke cigarettes, she does not drink alcohol. Patient has No headache, No chest pain, No abdominal pain - No Nausea, No new weakness tingling or numbness, No Cough - SOB.  Problem  Midline Thoracic Back Pain    ALLERGIES: Allergies  Allergen Reactions  . Hydrocodone Nausea And Vomiting  . Oxycodone Rash    PAST MEDICAL HISTORY: Past Medical History  Diagnosis Date  . Iron deficiency anemia   . Hypertension   . Arthritis   . Gallstones   . Hemorrhoids, external 10-21-11    occ. bothersome  . Calculus of gallbladder with acute and chronic cholecystitis without obstruction, s/p lap chole  27Dec2012 09/19/2011  . Bone lesion 10/04/2013  . Mammogram abnormal 10/04/2013    MEDICATIONS AT HOME: Prior to Admission medications   Medication Sig Start Date End Date Taking? Authorizing Provider  atorvastatin (LIPITOR) 20 MG tablet Take 1 tablet (20 mg total) by mouth daily. 12/11/14  Yes Tresa Garter, MD  gabapentin (NEURONTIN) 100 MG capsule Take 1 capsule (100 mg total) by mouth 2 (two) times daily. 07/17/14  Yes Tresa Garter, MD  lisinopril (PRINIVIL,ZESTRIL) 10 MG tablet Take 10 mg by mouth at bedtime.   Yes Historical Provider, MD  meloxicam (MOBIC) 15 MG tablet Take 1 tablet (15 mg total) by mouth daily. 12/11/14  Yes Tresa Garter, MD  Multiple Vitamin (MULTIVITAMIN WITH MINERALS) TABS tablet Take 1 tablet by mouth daily.   Yes Historical Provider, MD  omeprazole (PRILOSEC) 40 MG capsule Take 1 capsule (40 mg total) by mouth daily. 12/11/14  Yes Tresa Garter, MD  docusate sodium (COLACE) 100 MG capsule Take 1 capsule (100 mg total) by mouth 2 (two) times daily as needed for mild constipation. Patient not taking: Reported on 12/11/2014 10/16/13   Theodis Blaze, MD  ferrous sulfate 325 (65 FE) MG tablet Take 1 tablet (325 mg total) by mouth daily with breakfast. 12/11/14   Tresa Garter, MD  PARoxetine (PAXIL) 20 MG tablet Take 1 tablet (20 mg total) by mouth daily. 12/11/14   Tresa Garter, MD  Polyvinyl Alcohol-Povidone (REFRESH OP) Place 1 drop into both eyes 2 (two) times daily.    Historical Provider, MD  Objective:   Filed Vitals:   12/11/14 1050  BP: 126/79  Pulse: 84  Temp: 98.7 F (37.1 C)  TempSrc: Oral  Resp: 16  Height: 5' (1.524 m)  Weight: 168 lb (76.204 kg)  SpO2: 94%    Exam General appearance : Awake, alert, not in any distress. Speech Clear. Not toxic looking HEENT: Atraumatic and Normocephalic, pupils equally reactive to light and accomodation Neck: supple, no JVD. No cervical lymphadenopathy.  Chest:Good air  entry bilaterally, no added sounds  CVS: S1 S2 regular, no murmurs.  Abdomen: Bowel sounds present, Non tender and not distended with no gaurding, rigidity or rebound. Extremities: B/L Lower Ext shows no edema, both legs are warm to touch, negative leg raising sign  Neurology: Awake alert, and oriented X 3, CN II-XII intact, Non focal   Data Review Lab Results  Component Value Date   HGBA1C 5.6 12/20/2013   HGBA1C 5.4 06/10/2013     Assessment & Plan   1. Midline thoracic back pain  - MR Thoracic Spine Wo Contrast; Future - MR Lumbar Spine Wo Contrast; Future  2. Headache disorder  - meloxicam (MOBIC) 15 MG tablet; Take 1 tablet (15 mg total) by mouth daily.  Dispense: 30 tablet; Refill: 3 - ferrous sulfate 325 (65 FE) MG tablet; Take 1 tablet (325 mg total) by mouth daily with breakfast.  Dispense: 90 tablet; Refill: 3  3. Depression (emotion)  - PARoxetine (PAXIL) 20 MG tablet; Take 1 tablet (20 mg total) by mouth daily.  Dispense: 90 tablet; Refill: 3  4. Atypical chest pain  - omeprazole (PRILOSEC) 40 MG capsule; Take 1 capsule (40 mg total) by mouth daily.  Dispense: 90 capsule; Refill: 3  5. Dyslipidemia  - atorvastatin (LIPITOR) 20 MG tablet; Take 1 tablet (20 mg total) by mouth daily.  Dispense: 90 tablet; Refill: 3   Patient was counseled extensively about nutrition and exercise  Interpreter was used to communicate directly with patient for the entire encounter including providing detailed patient instructions.    Return in about 6 months (around 06/11/2015), or if symptoms worsen or fail to improve, for Follow up Pain and comorbidities, Generalized Anxiety Disorder.  The patient was given clear instructions to go to ER or return to medical center if symptoms don't improve, worsen or new problems develop. The patient verbalized understanding. The patient was told to call to get lab results if they haven't heard anything in the next week.   This note has been  created with Surveyor, quantity. Any transcriptional errors are unintentional.    Angelica Chessman, MD, [place], [place], [place] and [place], [place]   12/11/2014, 6:43 PM

## 2014-12-11 NOTE — Progress Notes (Signed)
Pt is here c/o severe back pain that radiates to her shoulder and to her chest. Pt states that sometimes the pain in so severe that its hard for her to breath. Pt has an interpreter.

## 2015-01-02 ENCOUNTER — Ambulatory Visit (HOSPITAL_COMMUNITY): Admission: RE | Admit: 2015-01-02 | Payer: Self-pay | Source: Ambulatory Visit

## 2015-01-02 ENCOUNTER — Ambulatory Visit (HOSPITAL_COMMUNITY): Payer: Self-pay

## 2015-01-13 NOTE — Progress Notes (Signed)
Patient ID: Susan Lopez, female   DOB: [birth year], 54 y.o.   MRN: [id number]   Susan Lopez, is a 54 y.o. female  [id number]  [id number]  DOB - [birth year]  Chief Complaint  Patient presents with  . Follow-up        Subjective:   Susan Lopez is a 54 y.o. female here today for a follow up visit. Patient has past medical history of iron deficiency anemia, hypertension, arthritis, gallstones here today for routine follow-up of hypertension. Patient reports that she is having a constant headache for more than a month and then lately she has been getting confused. She has no fever. She has no urinary symptoms. Activity of daily living is intact. Patient has No headache, No chest pain, No abdominal pain - No Nausea, No new weakness tingling or numbness, No Cough - SOB.  No problems updated.  ALLERGIES: Allergies  Allergen Reactions  . Hydrocodone Nausea And Vomiting  . Oxycodone Rash    PAST MEDICAL HISTORY: Past Medical History  Diagnosis Date  . Iron deficiency anemia   . Hypertension   . Arthritis   . Gallstones   . Hemorrhoids, external 10-21-11    occ. bothersome  . Calculus of gallbladder with acute and chronic cholecystitis without obstruction, s/p lap chole 27Dec2012 09/19/2011  . Bone lesion 10/04/2013  . Mammogram abnormal 10/04/2013    MEDICATIONS AT HOME: Prior to Admission medications   Medication Sig Start Date End Date Taking? Authorizing Provider  atorvastatin (LIPITOR) 20 MG tablet Take 1 tablet (20 mg total) by mouth daily. 12/11/14   Tresa Garter, MD  docusate sodium (COLACE) 100 MG capsule Take 1 capsule (100 mg total) by mouth 2 (two) times daily as needed for mild constipation. Patient not taking: Reported on 12/11/2014 10/16/13   Theodis Blaze, MD  ferrous sulfate 325 (65 FE) MG tablet Take 1 tablet (325 mg total) by mouth daily with breakfast. 12/11/14   Tresa Garter, MD  gabapentin (NEURONTIN) 100 MG  capsule Take 1 capsule (100 mg total) by mouth 2 (two) times daily. 07/17/14   Tresa Garter, MD  lisinopril (PRINIVIL,ZESTRIL) 10 MG tablet Take 10 mg by mouth at bedtime.    Historical Provider, MD  meloxicam (MOBIC) 15 MG tablet Take 1 tablet (15 mg total) by mouth daily. 12/11/14   Tresa Garter, MD  Multiple Vitamin (MULTIVITAMIN WITH MINERALS) TABS tablet Take 1 tablet by mouth daily.    Historical Provider, MD  omeprazole (PRILOSEC) 40 MG capsule Take 1 capsule (40 mg total) by mouth daily. 12/11/14   Tresa Garter, MD  PARoxetine (PAXIL) 20 MG tablet Take 1 tablet (20 mg total) by mouth daily. 12/11/14   Tresa Garter, MD  Polyvinyl Alcohol-Povidone (REFRESH OP) Place 1 drop into both eyes 2 (two) times daily.    Historical Provider, MD     Objective:   Filed Vitals:   07/17/14 1034  BP: 121/73  Pulse: 83  Temp: 98.1 F (36.7 C)  TempSrc: Oral  Resp: 16  Height: 5' (1.524 m)  Weight: 168 lb (76.204 kg)  SpO2: 99%    Exam General appearance : Awake, alert, not in any distress. Speech Clear. Not toxic looking HEENT: Atraumatic and Normocephalic, pupils equally reactive to light and accomodation Neck: supple, no JVD. No cervical lymphadenopathy.  Chest:Good air entry bilaterally, no added sounds  CVS: S1 S2 regular, no murmurs.  Abdomen: Bowel sounds present, Non tender and not distended with no gaurding, rigidity  or rebound. Extremities: B/L Lower Ext shows no edema, both legs are warm to touch Neurology: Awake alert, and oriented X 3, CN II-XII intact, Non focal Skin:No Rash  Data Review Lab Results  Component Value Date   HGBA1C 5.6 12/20/2013   HGBA1C 5.4 06/10/2013     Assessment & Plan   1. Headache(784.0)  - TSH - COMPLETE METABOLIC PANEL WITH GFR - Urinalysis, Complete - CBC with Differential - gabapentin (NEURONTIN) 100 MG capsule; Take 1 capsule (100 mg total) by mouth 2 (two) times daily.  Dispense: 180 capsule; Refill: 3  2.  Unspecified essential hypertension  - We have discussed target BP range and blood pressure goal - I have advised patient to check BP regularly and to call us back or report to clinic if the numbers are consistently higher than 140/90  - We discussed the importance of compliance with medical therapy and DASH diet recommended, consequences of uncontrolled hypertension discussed.  - continue current BP medications  3. Dyslipidemia  To address this please limit saturated fat to no more than 7% of your calories, limit cholesterol to 200 mg/day, increase fiber and exercise as tolerated. If needed we may add another cholesterol lowering medication to your regimen.    Patient have been counseled extensively about nutrition and exercise  Interpreter was used to communicate directly with patient for the entire encounter including providing detailed patient instructions.   Return in about 3 months (around 10/16/2014), or if symptoms worsen or fail to improve, for Follow up Pain and comorbidities, Routine Follow Up.  The patient was given clear instructions to go to ER or return to medical center if symptoms don't improve, worsen or new problems develop. The patient verbalized understanding. The patient was told to call to get lab results if they haven't heard anything in the next week.   This note has been created with Surveyor, quantity. Any transcriptional errors are unintentional.    Angelica Chessman, MD, [place], [place], [place] and [facility] [place], [place]

## 2015-03-09 ENCOUNTER — Encounter: Payer: Self-pay | Admitting: Internal Medicine

## 2015-03-09 ENCOUNTER — Ambulatory Visit: Payer: Self-pay | Attending: Internal Medicine | Admitting: Internal Medicine

## 2015-03-09 VITALS — BP 127/76 | HR 92 | Temp 98.0°F | Resp 16 | Wt 168.0 lb

## 2015-03-09 DIAGNOSIS — M25579 Pain in unspecified ankle and joints of unspecified foot: Secondary | ICD-10-CM | POA: Insufficient documentation

## 2015-03-09 DIAGNOSIS — F4321 Adjustment disorder with depressed mood: Secondary | ICD-10-CM | POA: Insufficient documentation

## 2015-03-09 DIAGNOSIS — F329 Major depressive disorder, single episode, unspecified: Secondary | ICD-10-CM | POA: Insufficient documentation

## 2015-03-09 DIAGNOSIS — E785 Hyperlipidemia, unspecified: Secondary | ICD-10-CM | POA: Insufficient documentation

## 2015-03-09 DIAGNOSIS — M546 Pain in thoracic spine: Secondary | ICD-10-CM | POA: Insufficient documentation

## 2015-03-09 DIAGNOSIS — F32A Depression, unspecified: Secondary | ICD-10-CM

## 2015-03-09 DIAGNOSIS — F4320 Adjustment disorder, unspecified: Secondary | ICD-10-CM | POA: Insufficient documentation

## 2015-03-09 DIAGNOSIS — R0789 Other chest pain: Secondary | ICD-10-CM | POA: Insufficient documentation

## 2015-03-09 LAB — CBC WITH DIFFERENTIAL/PLATELET
Basophils Absolute: 0.1 10*3/uL (ref 0.0–0.1)
Basophils Relative: 1 % (ref 0–1)
Eosinophils Absolute: 0.1 10*3/uL (ref 0.0–0.7)
Eosinophils Relative: 2 % (ref 0–5)
HCT: 44.7 % (ref 36.0–46.0)
HEMOGLOBIN: 15 g/dL (ref 12.0–15.0)
LYMPHS ABS: 1.8 10*3/uL (ref 0.7–4.0)
Lymphocytes Relative: 29 % (ref 12–46)
MCH: 30.6 pg (ref 26.0–34.0)
MCHC: 33.6 g/dL (ref 30.0–36.0)
MCV: 91.2 fL (ref 78.0–100.0)
MPV: 10.1 fL (ref 8.6–12.4)
Monocytes Absolute: 0.3 10*3/uL (ref 0.1–1.0)
Monocytes Relative: 5 % (ref 3–12)
Neutro Abs: 3.9 10*3/uL (ref 1.7–7.7)
Neutrophils Relative %: 63 % (ref 43–77)
Platelets: 270 10*3/uL (ref 150–400)
RBC: 4.9 MIL/uL (ref 3.87–5.11)
RDW: 13.4 % (ref 11.5–15.5)
WBC: 6.2 10*3/uL (ref 4.0–10.5)

## 2015-03-09 LAB — LIPID PANEL
Cholesterol: 216 mg/dL — ABNORMAL HIGH (ref 0–200)
HDL: 41 mg/dL — AB (ref >=46)
LDL CALC: 131 mg/dL — AB (ref 0–99)
Total CHOL/HDL Ratio: 5.3 Ratio
Triglycerides: 218 mg/dL — ABNORMAL HIGH (ref <150)
VLDL: 44 mg/dL — ABNORMAL HIGH (ref 0–40)

## 2015-03-09 MED ORDER — ATORVASTATIN CALCIUM 20 MG PO TABS: 20.0000 mg | ORAL_TABLET | Freq: Every day | ORAL | Status: DC

## 2015-03-09 MED ORDER — GABAPENTIN 100 MG PO CAPS: 100.0000 mg | ORAL_CAPSULE | Freq: Two times a day (BID) | ORAL | Status: DC

## 2015-03-09 MED ORDER — LISINOPRIL 10 MG PO TABS: 10.0000 mg | ORAL_TABLET | Freq: Every day | ORAL | Status: DC

## 2015-03-09 MED ORDER — OMEPRAZOLE 40 MG PO CPDR: 40.0000 mg | DELAYED_RELEASE_CAPSULE | Freq: Every day | ORAL | Status: DC

## 2015-03-09 NOTE — Progress Notes (Signed)
Patient complains of upper back pain.SOB and wheezing  Patient states she has been feeling this way for about five months

## 2015-03-09 NOTE — Progress Notes (Signed)
Patient ID: Susan Lopez, female   DOB: Oct 06, 1961, 54 y.o.   MRN: [id number]   Susan Lopez, is a 54 y.o. female  [id number]  [id number]  DOB - November 22, 1960  Chief Complaint  Patient presents with  . Back Pain        Subjective:   Susan Lopez is a 54 y.o. female here today for a follow up visit. Patient has history of iron deficiency anemia, hypertension, major depression and chronic back pain, here today with major complaint of ongoing back pain. Patient is very upset and tearful during this visit, she lost her mother yesterday and her major concern and grief is that she has not seen her for a long time, her mother was back in [place] and patient is not able to travel back to see her, her mother was about 24 years old when she passed on yesterday/mother's day. Patient's father is still alive. She was scheduled to have MRI done in the past but patient could not make the appointment because of other commitments. This back pain has been going on for about 5 months and is getting worse mostly between the upper and lower back. She has no known aggravating or relieving factors, she has no urinary or fecal incontinence, she denies any numbness or tingling in the lower extremities. She has been having headaches since her mother passed away yesterday. Patient has No chest pain, No abdominal pain - No Nausea, No new weakness tingling or numbness.  Problem  Grief Reaction    ALLERGIES: Allergies  Allergen Reactions  . Hydrocodone Nausea And Vomiting  . Oxycodone Rash    PAST MEDICAL HISTORY: Past Medical History  Diagnosis Date  . Iron deficiency anemia   . Hypertension   . Arthritis   . Gallstones   . Hemorrhoids, external 10-21-11    occ. bothersome  . Calculus of gallbladder with acute and chronic cholecystitis without obstruction, s/p lap chole 27Dec2012 09/19/2011  . Bone lesion 10/04/2013  . Mammogram abnormal 10/04/2013    MEDICATIONS AT  HOME: Prior to Admission medications   Medication Sig Start Date End Date Taking? Authorizing Provider  atorvastatin (LIPITOR) 20 MG tablet Take 1 tablet (20 mg total) by mouth daily. 12/11/14   Tresa Garter, MD  docusate sodium (COLACE) 100 MG capsule Take 1 capsule (100 mg total) by mouth 2 (two) times daily as needed for mild constipation. Patient not taking: Reported on 12/11/2014 10/16/13   Theodis Blaze, MD  ferrous sulfate 325 (65 FE) MG tablet Take 1 tablet (325 mg total) by mouth daily with breakfast. 12/11/14   Tresa Garter, MD  gabapentin (NEURONTIN) 100 MG capsule Take 1 capsule (100 mg total) by mouth 2 (two) times daily. 07/17/14   Tresa Garter, MD  lisinopril (PRINIVIL,ZESTRIL) 10 MG tablet Take 10 mg by mouth at bedtime.    Historical Provider, MD  meloxicam (MOBIC) 15 MG tablet Take 1 tablet (15 mg total) by mouth daily. 12/11/14   Tresa Garter, MD  Multiple Vitamin (MULTIVITAMIN WITH MINERALS) TABS tablet Take 1 tablet by mouth daily.    Historical Provider, MD  omeprazole (PRILOSEC) 40 MG capsule Take 1 capsule (40 mg total) by mouth daily. 12/11/14   Tresa Garter, MD  PARoxetine (PAXIL) 20 MG tablet Take 1 tablet (20 mg total) by mouth daily. 12/11/14   Tresa Garter, MD  Polyvinyl Alcohol-Povidone (REFRESH OP) Place 1 drop into both eyes 2 (two) times daily.    Historical Provider, MD  Objective:   Filed Vitals:   03/09/15 1118  BP: 127/76  Pulse: 92  Temp: 98 F (36.7 C)  Resp: 16  Weight: 168 lb (76.204 kg)  SpO2: 100%    Exam General appearance : Awake, alert, not in any distress. Speech Clear. Not toxic looking HEENT: Atraumatic and Normocephalic, pupils equally reactive to light and accomodation Neck: supple, no JVD. No cervical lymphadenopathy.  Chest:Good air entry bilaterally, no added sounds  CVS: S1 S2 regular, no murmurs.  Abdomen: Bowel sounds present, Non tender and not distended with no gaurding, rigidity or  rebound. Extremities: B/L Lower Ext shows no edema, both legs are warm to touch Neurology: Awake alert, and oriented X 3, CN II-XII intact, Non focal Skin:No Rash  Data Review Lab Results  Component Value Date   HGBA1C 5.6 12/20/2013   HGBA1C 5.4 06/10/2013     Assessment & Plan   1. Midline thoracic back pain  - MR Thoracic Spine Wo Contrast; Future - MR Lumbar Spine Wo Contrast; Future  2. Depression (emotion)  - CBC with Differential/Platelet Continue Paxil  3. Grief reaction  Patient was counseled extensively and offered anxiolytics, patient declined saying she will continue her Paxil because it's helping. Patient was offered comfort and social support.  4. Dyslipidemia  - Lipid panel  To address this please limit saturated fat to no more than 7% of your calories, limit cholesterol to 200 mg/day, increase fiber and exercise as tolerated. If needed we may add another cholesterol lowering medication to your regimen.   Patient have been counseled extensively about nutrition and exercise  Interpreter was used to communicate directly with patient for the entire encounter including providing detailed patient instructions.  Return in about 3 months (around 06/09/2015), or if symptoms worsen or fail to improve, for Routine Follow Up, Follow up Pain and comorbidities, Follow up HTN.  The patient was given clear instructions to go to ER or return to medical center if symptoms don't improve, worsen or new problems develop. The patient verbalized understanding. The patient was told to call to get lab results if they haven't heard anything in the next week.   This note has been created with Surveyor, quantity. Any transcriptional errors are unintentional.    Angelica Chessman, MD, [place], [place], [place], [place] and [place], [place]   03/09/2015, 12:07 PM

## 2015-03-09 NOTE — Patient Instructions (Signed)
Plan de alimentacin DASH (DASH Eating Plan) DASH es la sigla en ingls de "Enfoques Alimentarios para Detener la Hipertensin". El plan de alimentacin DASH ha demostrado bajar la presin arterial elevada (hipertensin). Los beneficios adicionales para la salud pueden incluir la disminucin del riesgo de diabetes mellitus tipo2, enfermedades cardacas e ictus. Este plan tambin puede ayudar a Horticulturist, commercial. QU DEBO SABER ACERCA DEL PLAN DE ALIMENTACIN DASH? Para el plan de alimentacin DASH, seguir las siguientes pautas generales:  Elija los alimentos con un valor porcentual diario de sodio de menos del 5% (segn figura en la etiqueta del alimento).  Use hierbas o aderezos sin sal, en lugar de sal de mesa o sal marina.  Consulte al mdico o farmacutico antes de usar sustitutos de la sal.  Coma productos con bajo contenido de sodio, cuya etiqueta suele decir "bajo contenido de sodio" o "sin agregado de sal".  Coma alimentos frescos.  Coma ms verduras, frutas y productos lcteos con bajo contenido de [place].  Elija los cereales integrales. Busque la palabra "integral" en Equities trader de la lista de ingredientes.  Elija el pescado y el pollo o el pavo sin piel ms a menudo que las carnes rojas. Limite el consumo de pescado, carne de ave y carne a 6onzas (170g) por Training and development officer.  Limite el consumo de dulces, postres, azcares y bebidas azucaradas.  Elija las grasas saludables para el corazn.  Limite el consumo de queso a 1onza (28g) por Training and development officer.  Consuma ms comida casera y menos de restaurante, de buf y comida rpida.  Limite el consumo de alimentos fritos.  Cocine los alimentos utilizando mtodos que no sean la fritura.  Limite las verduras enlatadas. Si las consume, enjuguelas bien para disminuir el sodio.  Cuando coma en un restaurante, pida que preparen su comida con menos sal o, en lo posible, sin nada de sal. QU ALIMENTOS PUEDO COMER? Pida ayuda a un nutricionista para  conocer las necesidades calricas individuales. Cereales Pan de salvado o integral. Arroz integral. Pastas de salvado o integrales. Quinua, trigo burgol y cereales integrales. Cereales con bajo contenido de sodio. Tortillas de harina de maz o de salvado. Pan de maz integral. Galletas saladas integrales. Galletas con bajo contenido de [place]. Vegetales Verduras frescas o congeladas (crudas, al vapor, asadas o grilladas). Jugos de tomate y verduras con contenido bajo o reducido de sodio. Pasta y salsa de tomate con contenido bajo o [place]. Verduras enlatadas con bajo contenido de sodio o reducido de sodio.  Lambert Mody Lambert Mody frescas, en conserva (en su jugo natural) o frutas congeladas. Carnes y otros productos con protenas Carne de res molida (al 85% o ms [place]), carne de res de animales alimentados con pastos o carne de res sin la grasa. Pollo o pavo sin piel. Carne de pollo o de [place]. Cerdo sin la grasa. Todos los pescados y frutos de mar. Huevos. Porotos, guisantes o lentejas secos. Frutos secos y semillas sin sal. Frijoles enlatados sin sal. Lcteos Productos lcteos con bajo contenido de grasas, como [place] o al 1%, quesos reducidos en grasas o al 2%, ricota con bajo contenido de grasas o [employer], o yogur natural con bajo contenido de [place]. Quesos con contenido bajo o reducido de sodio. Grasas y Naval architect en barra que no contengan grasas trans. [place]onesa y alios para ensaladas livianos o reducidos en grasas (reducidos en sodio). [place]. Aceites de crtamo, oliva o canola. [place] natural de man o almendra. Otros Palomitas de maz y pretzels sin sal.  Los artculos mencionados arriba pueden no ser [employer] de las bebidas o los alimentos recomendados. Comunquese con el nutricionista para conocer ms opciones. QU ALIMENTOS NO SE RECOMIENDAN? Cereales Pan blanco. Pastas blancas. Arroz blanco. Pan de maz refinado. Bagels y  croissants. Galletas saladas que contengan grasas trans. Vegetales Vegetales con crema o fritos. Verduras en [place]. Verduras enlatadas comunes. Pasta y salsa de tomate en lata comunes. Jugos comunes de tomate y de verduras. Lambert Mody Frutas secas. Fruta enlatada en almbar liviano o espeso. Jugo de frutas. Carnes y otros productos con protenas [place] de carne con Lobbyist. Costillas, alas de pollo, tocineta, salchicha, mortadela, salame, chinchulines, tocino, perros calientes, salchichas alemanas y embutidos envasados. Frutos secos y semillas con sal. Frijoles con sal en lata. Lcteos Leche entera o al 2%, crema, mezcla de Rudolph y crema, y queso crema. Yogur entero o endulzado. Quesos o queso azul con alto contenido de Physicist, medical. [place]rem[place] no lcteas y coberturas batidas. Quesos procesados, quesos para untar o cuajadas. Condimentos Sal de cebolla y ajo, sal condimentada, sal de mesa y sal marina. Salsas en lata y envasadas. Salsa [place]. Salsa trtara. Salsa barbacoa. Salsa teriyaki. Salsa de soja, incluso la que tiene contenido reducido de [place]. Salsa de carne. Salsa de pescado. Salsa de [place]. Salsa rosada. Rbano picante. Ketchup y mostaza. Saborizantes y tiernizantes para carne. Caldo en cubitos. Salsa picante. Salsa tabasco. Adobos. Aderezos para tacos. Salsas. Grasas y aceites [place], [place] en barra, [place] de [place], [place], [place] clarificada y Wendee Copp de tocino. Aceites de coco, de palmiste o de palma. Aderezos comunes para ensalada. Otros Pickles y [place]. Palomitas de maz y pretzels con sal. Los artculos mencionados arriba pueden no ser [employer] Company de las bebidas y los alimentos que se Higher education careers adviser. Comunquese con el nutricionista para obtener ms informacin. DNDE Dolan Amen MS INFORMACIN? [place], del Pulmn y de la Sangre (National Heart, Lung, and Privateer):  [email or web address] Document Released: 10/06/2011 Document Revised: 03/03/2014 [facility] Patient Information 2015 [place], [place]. This information is not intended to replace advice given to you by your health care provider. Make sure you discuss any questions you have with your health care provider. Dolor de espalda en el adulto (Back Pain, Adult)  El dolor de cintura es frecuente. Aproximadamente 1 de cada 5 personas lo sufren.La causa rara vez pone en peligro la vida. Con frecuencia mejora luego de algn tiempo.Alrededor de la mitad de las personas que sufren un inicio sbito de dolor de cintura, se sentirn mejor luego de 2 semanas. Aproximadamente 8 de cada 10 se sentirn mejor luego de 6 semanas.  CAUSAS  Algunas causas comunes son:   Distensin de los msculos o ligamentos que sostienen la columna vertebral.  Desgaste (degeneracin) de los discos vertebrales.  Artritis.  Traumatismos directos en la espalda. DIAGNSTICO  La mayor parte de las veces, la causa directa no se conoce.Sin embargo, Conservation officer, historic buildings puede tratarse efectivamente an cuando no se Community education officer.Una de las formas ms precisas de asegurar que la causa del dolor no constituye un peligro es responder a las preguntas del mdico acerca de su salud y sus sntomas. Si el mdico necesita ms informacin, podr indicar anlisis de laboratorio o Optometrist un diagnstico por imgenes (radiografas o Health visitor).Sin embargo, aunque las [employer] modificaciones, generalmente no es necesaria la [place].  INSTRUCCIONES PARA EL CUIDADO EN EL HOGAR  En algunas personas, el dolor de espalda vuelve.Como rara vez es peligroso, los pacientes pueden aprender a  manejarlo ellos mismos.   Mantngase activo. Si permanece sentado o de pie mucho tiempo en el mismo lugar, se tensiona la espalda.  No se siente, maneje ni se quede parado en un mismo lugar por ms de 30 minutos. Realice caminatas  cortas en superficies planas ni bien el dolor haya cedido. Trate de Orthoptist tiempo que camina .  No se quede en la cama.Si hace reposo durante ms de 1 o 2 das, puede Geologist, engineering.  No evite los ejercicios ni el trabajo.El cuerpo est hecho para moverse.No es peligroso estar [place], aunque le duela la espalda.La espalda se curar ms rpido si contina sus actividades antes de que el dolor se vaya.  Preste atencin a su cuerpo cuando se incline y se levante. Muchas personas sienten menos molestias cuando levantan objetos si doblan las rodillas, mantienen la carga cerca del cuerpo y evitan torcerse. Generalmente, las posiciones ms cmodas son las que ejercen menos tensin en la espalda en recuperacin.  Encuentre una posicin cmoda para dormir. Utilice un colchn firme y recustese de [place]. Doble ligeramente sus rodillas. Si se recuesta sobre su espalda, coloque una almohada debajo de sus rodillas.  Tome slo medicamentos de venta libre o recetados, segn las indicaciones del mdico. Los medicamentos de venta libre para Glass blower/designer y reducir Futures trader, son los que en general ms ayudan.El mdico podr prescribirle relajantes musculares.Estos medicamentos calman el dolor de modo que pueda retornar a sus actividades normales y a Marine scientist.  Aplique hielo sobre la zona lesionada.  Ponga el hielo en una bolsa plstica.  Colquese una toalla entre la piel y la bolsa de hielo.  Deje la bolsa de hielo durante 15 a 20 minutos 3 a 4 veces por da, durante los primeros 2  3 das. Luego podr alternar Lyndal Pulley calor y 41 para reducir Conservation officer, historic buildings y los espasmos.  Consulte a su mdico si puede tratar de hacer ejercicios para la espalda y recibir un masaje suave. Pueden ser beneficiosos.  Evite sentirse ansioso o estresado.El estrs aumenta la tensin muscular y puede empeorar el dolor de espalda.Es importante reconocer cuando est ansioso o  estresado y aprender la forma de controlarlos.El ejercicio es una gran opcin. SOLICITE ATENCIN MDICA SI:   Siente un dolor que no se alivia con reposo o medicamentos.  El dolor no mejora en 1 semana.  Desarrolla nuevos sntomas.  No se siente bien en general. SOLICITE ATENCIN MDICA DE INMEDIATO SI:  Siente un dolor que se irradia desde la espalda hacia sus piernas.  Desarrolla nuevos problemas en el intestino o la vejiga.  Siente debilidad o adormecimiento inusual en sus brazos o piernas.  Presenta nuseas o vmitos.  Presenta dolor abdominal.  Se siente desfalleciente. Document Released: 10/17/2005 Document Revised: 04/17/2012 [facility] Patient Information 2015 [place], [place]. This information is not intended to replace advice given to you by your health care provider. Make sure you discuss any questions you have with your health care provider. Hipertensin (Hypertension) La hipertensin, conocida comnmente como presin arterial alta, se produce cuando la sangre bombea en las arterias con mucha fuerza. Las arterias son los vasos sanguneos que transportan la sangre desde el corazn hacia todas las partes del cuerpo. Una lectura de la presin arterial consiste en un nmero ms alto sobre un nmero ms bajo, por ejemplo, 110/72. El nmero ms alto (presin sistlica) corresponde a la presin interna de las arterias cuando el corazn [place]. El nmero ms bajo (presin diastlica)  corresponde a la presin interna de las arterias cuando el corazn se [place]. En condiciones ideales, la presin arterial debe ser inferior a 120/80. La hipertensin fuerza al corazn a trabajar ms para Herbalist. Las arterias pueden estrecharse o ponerse rgidas. La hipertensin conlleva el riesgo de enfermedad cardaca, ictus y otros problemas.  [place] de riesgo de hipertensin son controlables, pero otros no lo son.  [employer] factores de riesgo que usted  no puede Chief Technology Officer, se incluyen:   Manufacturing systems engineer. El riesgo es mayor para las Retail banker.  La edad. Los riesgos aumentan con la edad.  El sexo. Antes de los 45aos, los hombres corren ms [employer]. Despus de los 65aos, las mujeres corren ms [employer]M [employer]. Entre los factores de riesgo que usted puede Chief Technology Officer, se incluyen:  No hacer la cantidad suficiente de actividad fsica o ejercicio.  Tener sobrepeso.  Consumir mucha grasa, azcar, caloras o sal en la dieta.  Beber alcohol en exceso. SIGNOS Y SNTOMAS Por lo general, la hipertensin no causa signos o sntomas. La hipertensin demasiado alta (crisis hipertensiva) puede causar dolor de cabeza, ansiedad, falta de aire y hemorragia nasal. DIAGNSTICO  Para detectar si usted tiene hipertensin, el mdico le medir la presin arterial mientras est sentado, con el brazo levantado a la altura del corazn. Debe medirla al [facility] veces en el mismo brazo. Determinadas condiciones pueden causar una diferencia de presin arterial entre el brazo izquierdo y Insurance underwriter. El hecho de tener una sola lectura de la presin arterial ms alta que lo normal no significa que Stage manager. En el caso de tener una lectura de la presin arterial con un valor alto, pdale al mdico que la verifique nuevamente. [place] hipertensin arterial incluye hacer cambios en el estilo de vida y, posiblemente, tomar medicamentos. Un estilo de vida saludable puede ayudar a bajar la presin arterial alta. Quiz deba cambiar algunos hbitos. Los Levi Strauss en el estilo de vida pueden incluir:  Seguir la dieta DASH. Esta dieta tiene un alto contenido de frutas, verduras y Psychologist, prison and probation services. Incluye poca cantidad de sal, carnes rojas y azcares agregados.  Hacer al menos 2horas de actividad fsica enrgica todas las semanas.  Perder peso, si es necesario.  No fumar.  Limitar el consumo de bebidas  alcohlicas.  Aprender formas de reducir el estrs. Si los cambios en el estilo de vida no son suficientes para Child psychotherapist la presin arterial, el mdico puede recetarle medicamentos. Quiz necesite tomar ms de uno. Trabaje en conjunto con su mdico para comprender los riesgos y los beneficios. INSTRUCCIONES PARA EL CUIDADO EN EL HOGAR  Haga que le midan de nuevo la presin arterial segn las indicaciones del [place] los medicamentos solamente como se lo haya indicado el mdico. Siga cuidadosamente las indicaciones. Los medicamentos para la presin arterial deben tomarse segn las indicaciones. Los medicamentos pierden eficacia al omitir las dosis. El hecho de omitir las dosis tambin [place] el riesgo de otros problemas.  No fume.  Contrlese la presin arterial en su casa segn las indicaciones del mdico. SOLICITE ATENCIN MDICA SI:   Piensa que tiene una reaccin alrgica a los medicamentos.  Tiene mareos o dolores de cabeza con Scientist, research (physical sciences).  Tiene hinchazn en los tobillos.  Tiene problemas de visin. SOLICITE ATENCIN MDICA DE INMEDIATO SI:  Siente un dolor de cabeza intenso o confusin.  Siente debilidad inusual, adormecimiento o que Geneticist, molecular.  Siente dolor intenso en el pecho o en el abdomen.  Vomita repetidas veces.  Tiene dificultad para respirar. ASEGRESE DE QUE:   Comprende estas instrucciones.  Controlar su afeccin.  Recibir ayuda de inmediato si no mejora o si empeora. Document Released: 10/17/2005 Document Revised: 03/03/2014 [facility] Patient Information 2015 [place], [place]. This information is not intended to replace advice given to you by your health care provider. Make sure you discuss any questions you have with your health care provider. Duelo (Grief Reaction) El duelo es una respuesta normal a la muerte de alguien cercano. Cleora Fleet persona que pierde a alguien que ama, tiene sentimientos de Control and instrumentation engineer, enojo y Biochemist, clinical. Tambin son  frecuentes los sntomas de depresin. stos se manifiestan como trastornos del sueo, prdida del apetito y falta de Teacher, early years/pre. Los sntomas del duelo duran entre algunas semanas y algunos meses luego de la prdida del ser querido. Tambin puede volver en momentos especiales que le recuerdan a la persona que perdi, como un aniversario o cumpleaos. La ansiedad, el insomnio, la irritabilidad y la depresin profunda pueden durar ms all del perodo normal de duelo. Si experimenta estos sentimientos por 6 meses o ms, es posible que sufra de depresin clnica. La depresin clnica requiere atencin mdica adicional. Si cree que sufre de depresin clnica, debe comunicarse con el profesional que lo asiste. Si tiene [employer] personal o familiar de depresin, tiene mayor riesgo de sufrirla. Tambin tendr un mayor riesgo de sufrir depresin clnica si la prdida fue traumtica o si fue de alguien con quien tena cuestiones sin Investment banker, operational.  Un duelo puede complicarse si no se manifiesta. Esto significa ser incapaz de llorar o de expresar emociones extremas. Puede prolongar el perodo de duelo y [place] efectos emocionales de la prdida. El luto es un suceso natural en la vida [place]. Una reaccin de duelo saludable es aquella que no es bloqueada. Requiere un tiempo de tristeza y readaptacin. Es muy importante compartir su dolor y sus temores con Producer, television/film/video, especialmente amigos cercanos y familiares. Los terapeutas y los sacerdotes tambin pueden ayudar en el proceso de duelo. Document Released: 10/17/2005 Document Revised: 01/09/2012 [facility] Patient Information 2015 [place]. This information is not intended to replace advice given to you by your health care provider. Make sure you discuss any questions you have with your health care provider.

## 2015-03-24 ENCOUNTER — Ambulatory Visit (HOSPITAL_COMMUNITY)
Admission: RE | Admit: 2015-03-24 | Discharge: 2015-03-24 | Disposition: A | Discharge status: Home / Self Care | Payer: Self-pay | Source: Ambulatory Visit | Attending: Internal Medicine | Admitting: Internal Medicine

## 2015-03-24 ENCOUNTER — Ambulatory Visit (HOSPITAL_COMMUNITY): Payer: Self-pay

## 2015-03-24 DIAGNOSIS — G8929 Other chronic pain: Secondary | ICD-10-CM | POA: Insufficient documentation

## 2015-03-24 DIAGNOSIS — M4806 Spinal stenosis, lumbar region: Secondary | ICD-10-CM | POA: Insufficient documentation

## 2015-03-24 DIAGNOSIS — M546 Pain in thoracic spine: Secondary | ICD-10-CM

## 2015-03-24 DIAGNOSIS — M5137 Other intervertebral disc degeneration, lumbosacral region: Secondary | ICD-10-CM | POA: Insufficient documentation

## 2015-03-24 DIAGNOSIS — M5134 Other intervertebral disc degeneration, thoracic region: Secondary | ICD-10-CM | POA: Insufficient documentation

## 2015-03-24 DIAGNOSIS — M5124 Other intervertebral disc displacement, thoracic region: Secondary | ICD-10-CM | POA: Insufficient documentation

## 2015-03-25 ENCOUNTER — Telehealth: Payer: Self-pay | Admitting: General Practice

## 2015-03-25 NOTE — Telephone Encounter (Signed)
Patient called to inform clinic staff that she had MRI performed yesterday, 03/24/15.  Please call with results once available

## 2015-03-27 ENCOUNTER — Telehealth: Payer: Self-pay | Admitting: *Deleted

## 2015-03-27 NOTE — Telephone Encounter (Signed)
-----   Message from Tresa Garter, MD sent at 03/18/2015  5:28 PM EDT ----- Please inform patient that her blood count is normal, but her cholesterol level is very high. We will start her on cholesterol medication and also to address this please limit saturated fat to no more than 7% of your calories, limit cholesterol to 200 mg/day, increase fiber and exercise as tolerated. If needed we may add another cholesterol lowering medication to your regimen.   Please call prescription atorvastatin 20 mg tablet by mouth daily, 30 tablets with 3 refills.

## 2015-03-27 NOTE — Telephone Encounter (Signed)
[place] # [phone]  Left message to return call

## 2015-06-08 ENCOUNTER — Ambulatory Visit: Payer: Self-pay | Attending: Internal Medicine

## 2015-07-02 ENCOUNTER — Telehealth: Payer: Self-pay | Admitting: Internal Medicine

## 2015-07-02 NOTE — Telephone Encounter (Signed)
Patient called requesting medication refill for lisinopril (PRINIVIL,ZESTRIL) 10 MG tablet and omeprazole (PRILOSEC) 40 MG capsule. Patient also would like to know if she is able to receive 90 day supply due to transportation,Please f/u

## 2015-07-08 NOTE — Telephone Encounter (Signed)
Patient called requesting medication refill for lisinopril (PRINIVIL,ZESTRIL) 10 MG tablet, patient states she is completely out of medication. Please f/u

## 2015-07-31 ENCOUNTER — Telehealth: Payer: Self-pay | Admitting: Internal Medicine

## 2015-07-31 NOTE — Telephone Encounter (Signed)
lisinopril (PRINIVIL,ZESTRIL) 10 MG tablet --- currently only has 4 pills left which is 4 days left. omeprazole (PRILOSEC) 40 MG capsule gabapentin (NEURONTIN) 100 MG capsule atorvastatin (LIPITOR) 20 MG tablet meloxicam (MOBIC) 15 MG tablet

## 2015-08-05 ENCOUNTER — Other Ambulatory Visit: Payer: Self-pay | Admitting: Internal Medicine

## 2015-08-05 NOTE — Telephone Encounter (Signed)
Patient called requesting a med refill on atorvastatin (LIPITOR) 20 MG tablet , lisinopril (PRINIVIL,ZESTRIL) 10 MG tablet, meloxicam (MOBIC) 15 MG tablet, and gabapentin (NEURONTIN) 100 MG. Patient stated she is out of her med. Please f/u with pt.

## 2015-08-07 NOTE — Telephone Encounter (Signed)
Patient called requesting a med refill on atorvastatin (LIPITOR) 20 MG tablet , lisinopril (PRINIVIL,ZESTRIL) 10 MG tablet, meloxicam (MOBIC) 15 MG tablet, and gabapentin (NEURONTIN) 100 MG. Patient stated she is out of her med. Please f/u with pt.

## 2015-08-12 ENCOUNTER — Ambulatory Visit: Payer: Self-pay | Attending: Family Medicine | Admitting: Family Medicine

## 2015-08-12 ENCOUNTER — Encounter: Payer: Self-pay | Admitting: Family Medicine

## 2015-08-12 VITALS — BP 107/72 | HR 85 | Temp 98.2°F | Resp 16 | Ht 60.0 in | Wt 167.8 lb

## 2015-08-12 DIAGNOSIS — M542 Cervicalgia: Secondary | ICD-10-CM

## 2015-08-12 MED ORDER — NAPROXEN 500 MG PO TABS: 500.0000 mg | ORAL_TABLET | Freq: Two times a day (BID) | ORAL | Status: DC

## 2015-08-12 MED ORDER — CYCLOBENZAPRINE HCL 10 MG PO TABS: 10.0000 mg | ORAL_TABLET | Freq: Three times a day (TID) | ORAL | Status: DC | PRN

## 2015-08-12 NOTE — Progress Notes (Signed)
   Subjective:    Patient ID: Susan Lopez, female    DOB: [birth year], 54 y.o.   MRN: [id number]  HPI  Visit in [place]. Patient with c/o R sided neck and scalp pain that started 20 days ago when she fell from a beach chair while camping. The chair was unstable on the edge of a small hill (3 ft elevation), she leaned and lost balance, struck R side of head on ground. No LOC. No emesis, had a mild HA the next day with some nausea. Has had some pain in R trap and side of head. Meloxicam helps very little.   ROS No LOC, no loss of balance or weakness. Has diffuse joint pain and back pain from previous. Has had intermittent visual changes (blurred vision). 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  Feels tearful with thoughts of mother who died 107 months ago last week.   Review of Systems     Objective:   Physical Exam Well appearing, no distress HEENT< Full active ROM neck in all planes. No point tenderness over cervical vertebrae.  Able to internally rotate R arm completely. Strength and sensation in both hands/arms full and symmetric. Palpable radial pulses bilaterally, handgrip full and symmetric bilat.  CN II-XII full and intact. Moist mucus membranes. Funduscopic exam with crisp discs visible bilaterally, no papilledema.  COR Regular S1S2       Assessment & Plan:

## 2015-08-12 NOTE — Patient Instructions (Signed)
fue un placer verle hoy.   Para el dolor del cuello y la cabeza, DEJE DE TOMAR LA MELOXICAM POR AHORA; ESTOY RECETANDO NAPROXEN 500mg , UNA TABLETA POR BOCA 2 VECES POR DIA CON ALGO DE COMER, POR 15 DIAS>   relajante de musculos, Cyclobenzaprine 10mg , por boca, antes de dormir, por 7 dias.   Llame si no esta' mejorando.

## 2015-08-12 NOTE — Progress Notes (Signed)
Patient here for headaches that she has been experiencing due to hitting her head from falling 15 days ago. Patient has been experiencing headaches and neck pain since then. Pt reports pain on the right side of her head that radiates to her neck, rated as 8-9, described as pressure. Pain is constant. Patient has taken her medications today and needs refill on omeprazole, atorvastatin, meloxicam and lisinopril.     [place] ID [phone] used.

## 2015-09-14 ENCOUNTER — Other Ambulatory Visit: Payer: Self-pay

## 2015-09-14 MED ORDER — LISINOPRIL 10 MG PO TABS: 10.0000 mg | ORAL_TABLET | Freq: Every day | ORAL | Status: DC

## 2015-09-16 ENCOUNTER — Other Ambulatory Visit: Payer: Self-pay | Admitting: Family Medicine

## 2015-10-08 ENCOUNTER — Other Ambulatory Visit: Payer: Self-pay | Admitting: Internal Medicine

## 2015-10-08 ENCOUNTER — Ambulatory Visit: Payer: Self-pay | Attending: Internal Medicine | Admitting: Internal Medicine

## 2015-10-08 ENCOUNTER — Encounter: Payer: Self-pay | Admitting: Internal Medicine

## 2015-10-08 VITALS — BP 116/86 | HR 84 | Temp 98.6°F | Resp 18 | Ht 60.0 in | Wt 170.6 lb

## 2015-10-08 DIAGNOSIS — Z79899 Other long term (current) drug therapy: Secondary | ICD-10-CM | POA: Insufficient documentation

## 2015-10-08 DIAGNOSIS — R51 Headache: Secondary | ICD-10-CM

## 2015-10-08 DIAGNOSIS — R519 Headache, unspecified: Secondary | ICD-10-CM | POA: Insufficient documentation

## 2015-10-08 DIAGNOSIS — M25571 Pain in right ankle and joints of right foot: Secondary | ICD-10-CM | POA: Insufficient documentation

## 2015-10-08 DIAGNOSIS — F32A Depression, unspecified: Secondary | ICD-10-CM

## 2015-10-08 DIAGNOSIS — Z885 Allergy status to narcotic agent status: Secondary | ICD-10-CM | POA: Insufficient documentation

## 2015-10-08 DIAGNOSIS — F329 Major depressive disorder, single episode, unspecified: Secondary | ICD-10-CM | POA: Insufficient documentation

## 2015-10-08 DIAGNOSIS — I10 Essential (primary) hypertension: Secondary | ICD-10-CM | POA: Insufficient documentation

## 2015-10-08 DIAGNOSIS — R0789 Other chest pain: Secondary | ICD-10-CM | POA: Insufficient documentation

## 2015-10-08 DIAGNOSIS — Z8719 Personal history of other diseases of the digestive system: Secondary | ICD-10-CM | POA: Insufficient documentation

## 2015-10-08 DIAGNOSIS — E785 Hyperlipidemia, unspecified: Secondary | ICD-10-CM | POA: Insufficient documentation

## 2015-10-08 DIAGNOSIS — M25572 Pain in left ankle and joints of left foot: Secondary | ICD-10-CM | POA: Insufficient documentation

## 2015-10-08 DIAGNOSIS — M25579 Pain in unspecified ankle and joints of unspecified foot: Secondary | ICD-10-CM | POA: Insufficient documentation

## 2015-10-08 DIAGNOSIS — N950 Postmenopausal bleeding: Secondary | ICD-10-CM | POA: Insufficient documentation

## 2015-10-08 DIAGNOSIS — Z0000 Encounter for general adult medical examination without abnormal findings: Secondary | ICD-10-CM | POA: Insufficient documentation

## 2015-10-08 LAB — CBC WITH DIFFERENTIAL/PLATELET
BASOS PCT: 1 % (ref 0–1)
Basophils Absolute: 0.1 10*3/uL (ref 0.0–0.1)
EOS ABS: 0.2 10*3/uL (ref 0.0–0.7)
Eosinophils Relative: 2 % (ref 0–5)
HCT: 44 % (ref 36.0–46.0)
Hemoglobin: 14.8 g/dL (ref 12.0–15.0)
Lymphocytes Relative: 32 % (ref 12–46)
Lymphs Abs: 2.4 10*3/uL (ref 0.7–4.0)
MCH: 30.9 pg (ref 26.0–34.0)
MCHC: 33.6 g/dL (ref 30.0–36.0)
MCV: 91.9 fL (ref 78.0–100.0)
MONO ABS: 0.4 10*3/uL (ref 0.1–1.0)
MONOS PCT: 5 % (ref 3–12)
MPV: 10.3 fL (ref 8.6–12.4)
Neutro Abs: 4.5 10*3/uL (ref 1.7–7.7)
Neutrophils Relative %: 60 % (ref 43–77)
PLATELETS: 262 10*3/uL (ref 150–400)
RBC: 4.79 MIL/uL (ref 3.87–5.11)
RDW: 13.6 % (ref 11.5–15.5)
WBC: 7.5 10*3/uL (ref 4.0–10.5)

## 2015-10-08 MED ORDER — OMEPRAZOLE 40 MG PO CPDR: 40.0000 mg | DELAYED_RELEASE_CAPSULE | Freq: Every day | ORAL | Status: DC

## 2015-10-08 MED ORDER — LISINOPRIL 10 MG PO TABS: 10.0000 mg | ORAL_TABLET | Freq: Every day | ORAL | Status: DC

## 2015-10-08 MED ORDER — PAROXETINE HCL 20 MG PO TABS: 20.0000 mg | ORAL_TABLET | Freq: Every day | ORAL | Status: DC

## 2015-10-08 MED ORDER — DOCUSATE SODIUM 100 MG PO CAPS: 100.0000 mg | ORAL_CAPSULE | Freq: Two times a day (BID) | ORAL | Status: DC | PRN

## 2015-10-08 MED ORDER — FERROUS SULFATE 325 (65 FE) MG PO TABS: 325.0000 mg | ORAL_TABLET | Freq: Every day | ORAL | Status: DC

## 2015-10-08 MED ORDER — CYCLOBENZAPRINE HCL 10 MG PO TABS: 10.0000 mg | ORAL_TABLET | Freq: Three times a day (TID) | ORAL | Status: DC | PRN

## 2015-10-08 MED ORDER — MELOXICAM 15 MG PO TABS: 15.0000 mg | ORAL_TABLET | Freq: Every day | ORAL | Status: DC

## 2015-10-08 MED ORDER — ATORVASTATIN CALCIUM 20 MG PO TABS: 20.0000 mg | ORAL_TABLET | Freq: Every day | ORAL | Status: DC

## 2015-10-08 NOTE — Progress Notes (Signed)
Patient here for leg pain.  Patient complains of pain on her right side which radiates down her leg to the heel of her foot. Pain is scaled at a 7, described as throbbing pain.  Patient would like her flu shot today.

## 2015-10-08 NOTE — Progress Notes (Signed)
Patient ID: Susan Lopez, female   DOB: 1961-07-14, 54 y.o.   MRN: [id number]   Susan Lopez, is a 54 y.o. female  [id number]  [id number]  DOB - [birth year]  Chief Complaint  Patient presents with  . Leg Pain        Subjective:   Susan Lopez is a 54 y.o. female with history of iron deficiency anemia, hypertension, major depression and chronic back pain here today for a follow up visit. She is complaining of pain in her right side which radiates down to her legs and foot, she rates the pain as 7 out of 10, described as throbbing. She denies any trauma or injury. She has been postmenopausal for years but recently started bleeding again. It started with spotting but now is more of menstrual flow. She has no abdominal pain. No vaginal discharge. No urinary symptoms. Patient has No headache, No chest pain, No Nausea, No new weakness tingling or numbness, No Cough - SOB.  Problem  Healthcare Maintenance  Postmenopausal Bleeding  Pain in Joint, Ankle and Foot  Headache Disorder  Atypical Chest Pain  Essential Hypertension    ALLERGIES: Allergies  Allergen Reactions  . Hydrocodone Nausea And Vomiting  . Oxycodone Rash    PAST MEDICAL HISTORY: Past Medical History  Diagnosis Date  . Iron deficiency anemia   . Hypertension   . Arthritis   . Gallstones   . Hemorrhoids, external 10-21-11    occ. bothersome  . Calculus of gallbladder with acute and chronic cholecystitis without obstruction, s/p lap chole [id number] 09/19/2011  . Bone lesion 10/04/2013  . Mammogram abnormal 10/04/2013    MEDICATIONS AT HOME: Prior to Admission medications   Medication Sig Start Date End Date Taking? Authorizing Provider  atorvastatin (LIPITOR) 20 MG tablet Take 1 tablet (20 mg total) by mouth daily. 10/08/15  Yes Tresa Garter, MD  cyclobenzaprine (FLEXERIL) 10 MG tablet Take 1 tablet (10 mg total) by mouth 3 (three) times daily as needed for muscle spasms.  10/08/15  Yes Tresa Garter, MD  gabapentin (NEURONTIN) 100 MG capsule Take 1 capsule (100 mg total) by mouth 2 (two) times daily. 03/09/15  Yes Lorayne Marek, MD  lisinopril (PRINIVIL,ZESTRIL) 10 MG tablet Take 1 tablet (10 mg total) by mouth daily. 10/08/15  Yes Tresa Garter, MD  meloxicam (MOBIC) 15 MG tablet Take 1 tablet (15 mg total) by mouth daily. 10/08/15  Yes Tresa Garter, MD  Multiple Vitamin (MULTIVITAMIN WITH MINERALS) TABS tablet Take 1 tablet by mouth daily.   Yes Historical Provider, MD  naproxen (NAPROSYN) 500 MG tablet Take 1 tablet (500 mg total) by mouth 2 (two) times daily with a meal. 08/12/15  Yes Willeen Niece, MD  omeprazole (PRILOSEC) 40 MG capsule Take 1 capsule (40 mg total) by mouth daily. 10/08/15  Yes Tresa Garter, MD  PARoxetine (PAXIL) 20 MG tablet Take 1 tablet (20 mg total) by mouth daily. 10/08/15  Yes Tresa Garter, MD  Polyvinyl Alcohol-Povidone (REFRESH OP) Place 1 drop into both eyes 2 (two) times daily.   Yes Historical Provider, MD  docusate sodium (COLACE) 100 MG capsule Take 1 capsule (100 mg total) by mouth 2 (two) times daily as needed for mild constipation. 10/08/15   Tresa Garter, MD  ferrous sulfate 325 (65 FE) MG tablet Take 1 tablet (325 mg total) by mouth daily with breakfast. 10/08/15   Tresa Garter, MD     Objective:   Filed Vitals:  10/08/15 1109  BP: 116/86  Pulse: 84  Temp: 98.6 F (37 C)  TempSrc: Oral  Resp: 18  Height: 5' (1.524 m)  Weight: 170 lb 9.6 oz (77.384 kg)  SpO2: 96%    Exam General appearance : Awake, alert, not in any distress. Speech Clear. Not toxic looking HEENT: Atraumatic and Normocephalic, pupils equally reactive to light and accomodation Neck: supple, no JVD. No cervical lymphadenopathy.  Chest:Good air entry bilaterally, no added sounds  CVS: S1 S2 regular, no murmurs.  Abdomen: Bowel sounds present, Non tender and not distended with no gaurding, rigidity or  rebound. Extremities: B/L Lower Ext shows no edema, both legs are warm to touch Neurology: Awake alert, and oriented X 3, CN II-XII intact, Non focal  Data Review Lab Results  Component Value Date   HGBA1C 5.6 12/20/2013   HGBA1C 5.4 06/10/2013     Assessment & Plan   1. Healthcare maintenance  - Flu Vaccine QUAD 36+ mos PF IM (Fluarix & Fluzone Quad PF)  2. Postmenopausal bleeding  - ferrous sulfate 325 (65 FE) MG tablet; Take 1 tablet (325 mg total) by mouth daily with breakfast.  Dispense: 90 tablet; Refill: 3 - US Pelvis Complete; Future - US Transvaginal Non-OB; Future - CBC with Differential/Platelet  3. Pain in joint, ankle and foot, unspecified laterality  - cyclobenzaprine (FLEXERIL) 10 MG tablet; Take 1 tablet (10 mg total) by mouth 3 (three) times daily as needed for muscle spasms.  Dispense: 30 tablet; Refill: 3 - meloxicam (MOBIC) 15 MG tablet; Take 1 tablet (15 mg total) by mouth daily.  Dispense: 30 tablet; Refill: 3 - omeprazole (PRILOSEC) 40 MG capsule; Take 1 capsule (40 mg total) by mouth daily.  Dispense: 90 capsule; Refill: 3  4. Dyslipidemia  - atorvastatin (LIPITOR) 20 MG tablet; Take 1 tablet (20 mg total) by mouth daily.  Dispense: 90 tablet; Refill: 3 - Lipid panel  To address this please limit saturated fat to no more than 7% of your calories, limit cholesterol to 200 mg/day, increase fiber and exercise as tolerated. If needed we may add another cholesterol lowering medication to your regimen.   5. Depression (emotion)  - docusate sodium (COLACE) 100 MG capsule; Take 1 capsule (100 mg total) by mouth 2 (two) times daily as needed for mild constipation.  Dispense: 60 capsule; Refill: 3 - PARoxetine (PAXIL) 20 MG tablet; Take 1 tablet (20 mg total) by mouth daily.  Dispense: 90 tablet; Refill: 3  6. Essential hypertension  - lisinopril (PRINIVIL,ZESTRIL) 10 MG tablet; Take 1 tablet (10 mg total) by mouth daily.  Dispense: 90 tablet; Refill:  3  We have discussed target BP range and blood pressure goal. I have advised patient to check BP regularly and to call us back or report to clinic if the numbers are consistently higher than 140/90. We discussed the importance of compliance with medical therapy and DASH diet recommended, consequences of uncontrolled hypertension discussed.   - continue current BP medications Patient have been counseled extensively about nutrition and exercise  Interpreter was used to communicate directly with patient for the entire encounter including providing detailed patient instructions.   Return in about 6 months (around 04/07/2016) for Routine Follow Up, Follow up Pain and comorbidities.  The patient was given clear instructions to go to ER or return to medical center if symptoms don't improve, worsen or new problems develop. The patient verbalized understanding. The patient was told to call to get lab results if they haven't heard  anything in the next week.   This note has been created with Surveyor, quantity. Any transcriptional errors are unintentional.    Angelica Chessman, MD, [place], [place], [place], [place] and [place], [place]   10/08/2015, 12:11 PM

## 2015-10-08 NOTE — Patient Instructions (Signed)
Plan de alimentacin DASH (DASH Eating Plan) DASH es la sigla en ingls de "Enfoques Alimentarios para Detener la Hipertensin". El plan de alimentacin DASH ha demostrado bajar la presin arterial elevada (hipertensin). Los beneficios adicionales para la salud pueden incluir la disminucin del riesgo de diabetes mellitus tipo2, enfermedades cardacas e ictus. Este plan tambin puede ayudar a Horticulturist, commercial. QU DEBO SABER ACERCA DEL PLAN DE ALIMENTACIN DASH? Para el plan de alimentacin DASH, seguir las siguientes pautas generales:  Elija los alimentos con un valor porcentual diario de sodio de menos del 5% (segn figura en la etiqueta del alimento).  Use hierbas o aderezos sin sal, en lugar de sal de mesa o sal marina.  Consulte al mdico o farmacutico antes de usar sustitutos de la sal.  Coma productos con bajo contenido de sodio, cuya etiqueta suele decir "bajo contenido de sodio" o "sin agregado de sal".  Coma alimentos frescos.  Coma ms verduras, frutas y productos lcteos con bajo contenido de [place].  Elija los cereales integrales. Busque la palabra "integral" en Equities trader de la lista de ingredientes.  Elija el pescado y el pollo o el pavo sin piel ms a menudo que las carnes rojas. Limite el consumo de pescado, carne de ave y carne a 6onzas (170g) por Training and development officer.  Limite el consumo de dulces, postres, azcares y bebidas azucaradas.  Elija las grasas saludables para el corazn.  Limite el consumo de queso a 1onza (28g) por Training and development officer.  Consuma ms comida casera y menos de restaurante, de buf y comida rpida.  Limite el consumo de alimentos fritos.  Cocine los alimentos utilizando mtodos que no sean la fritura.  Limite las verduras enlatadas. Si las consume, enjuguelas bien para disminuir el sodio.  Cuando coma en un restaurante, pida que preparen su comida con menos sal o, en lo posible, sin nada de sal. QU ALIMENTOS PUEDO COMER? Pida ayuda a un nutricionista para  conocer las necesidades calricas individuales. Cereales Pan de salvado o integral. Arroz integral. Pastas de salvado o integrales. Quinua, trigo burgol y cereales integrales. Cereales con bajo contenido de sodio. Tortillas de harina de maz o de salvado. Pan de maz integral. Galletas saladas integrales. Galletas con bajo contenido de [place]. Vegetales Verduras frescas o congeladas (crudas, al vapor, asadas o grilladas). Jugos de tomate y verduras con contenido bajo o reducido de sodio. Pasta y salsa de tomate con contenido bajo o [place]. Verduras enlatadas con bajo contenido de sodio o reducido de sodio.  Lambert Mody Lambert Mody frescas, en conserva (en su jugo natural) o frutas congeladas. Carnes y otros productos con protenas Carne de res molida (al 85% o ms [place]), carne de res de animales alimentados con pastos o carne de res sin la grasa. Pollo o pavo sin piel. Carne de pollo o de [place]. Cerdo sin la grasa. Todos los pescados y frutos de mar. Huevos. Porotos, guisantes o lentejas secos. Frutos secos y semillas sin sal. Frijoles enlatados sin sal. Lcteos Productos lcteos con bajo contenido de grasas, como [place] o al 1%, quesos reducidos en grasas o al 2%, ricota con bajo contenido de grasas o [employer], o yogur natural con bajo contenido de [place]. Quesos con contenido bajo o reducido de sodio. Grasas y Naval architect en barra que no contengan grasas trans. [place] y alios para ensaladas livianos o reducidos en grasas (reducidos en sodio). [place]. Aceites de crtamo, oliva o canola. Mantequilla natural de man o almendra. Otros Palomitas de maz y pretzels sin sal.  Los artculos mencionados arriba pueden no ser una lista completa de las bebidas o los alimentos recomendados. Comunquese con el nutricionista para conocer ms opciones. QU ALIMENTOS NO SE RECOMIENDAN? Cereales Pan blanco. Pastas blancas. Arroz blanco. Pan de maz refinado. Bagels y  croissants. Galletas saladas que contengan grasas trans. Vegetales Vegetales con crema o fritos. Verduras en salsa de queso. Verduras enlatadas comunes. Pasta y salsa de tomate en lata comunes. Jugos comunes de tomate y de verduras. Frutas Frutas secas. Fruta enlatada en almbar liviano o espeso. Jugo de frutas. Carnes y otros productos con protenas [place] de carne con grasa. Costillas, alas de pollo, tocineta, salchicha, mortadela, salame, chinchulines, tocino, perros calientes, salchichas alemanas y embutidos envasados. Frutos secos y semillas con sal. Frijoles con sal en lata. Lcteos Leche entera o al 2%, crema, mezcla de leche y crema, y queso crema. Yogur entero o endulzado. Quesos o queso azul con alto contenido de grasas. [place]rem[place] no lcteas y coberturas batidas. Quesos procesados, quesos para untar o cuajadas. Condimentos Sal de cebolla y ajo, sal condimentada, sal de mesa y sal marina. Salsas en lata y envasadas. Salsa [place]. Salsa trtara. Salsa barbacoa. Salsa teriyaki. Salsa de soja, incluso la que tiene contenido reducido de sodio. Salsa de carne. Salsa de pescado. Salsa de ostras. Salsa rosada. Rbano picante. Ketchup y mostaza. Saborizantes y tiernizantes para carne. Caldo en cubitos. Salsa picante. Salsa tabasco. Adobos. Aderezos para tacos. Salsas. Grasas y aceites Mantequilla, margarina en barra, manteca de cerdo, grasa, mantequilla clarificada y grasa de tocino. Aceites de coco, de palmiste o de palma. Aderezos comunes para ensalada. Otros Pickles y aceitunas. Palomitas de maz y pretzels con sal. Los artculos mencionados arriba pueden no ser una lista completa de las bebidas y los alimentos que se deben evitar. Comunquese con el nutricionista para obtener ms informacin. DNDE PUEDO ENCONTRAR MS INFORMACIN? Instituto Nacional del Corazn, del Pulmn y de la Sangre (National Heart, Lung, and Blood Institute): www.nhlbi.nih.gov/health/health-topics/topics/dash/     Esta informacin no tiene como fin reemplazar el consejo del mdico. Asegrese de hacerle al mdico cualquier pregunta que tenga.   Document Released: 10/06/2011 Document Revised: 11/07/2014 Elsevier Interactive Patient Education 2016 Elsevier Inc. Hipertensin (Hypertension) La hipertensin, conocida comnmente como presin arterial alta, se produce cuando la sangre bombea en las arterias con mucha fuerza. Las arterias son los vasos sanguneos que transportan la sangre desde el corazn hacia todas las partes del cuerpo. Una lectura de la presin arterial consiste en un nmero ms alto sobre un nmero ms bajo, por ejemplo, 110/72. El nmero ms alto (presin sistlica) corresponde a la presin interna de las arterias cuando el corazn bombea sangre. El nmero ms bajo (presin diastlica) corresponde a la presin interna de las arterias cuando el corazn se relaja. En condiciones ideales, la presin arterial debe ser inferior a 120/80. La hipertensin fuerza al corazn a trabajar ms para bombear la sangre. Las arterias pueden estrecharse o ponerse rgidas. La hipertensin no tratada o no controlada puede causar infarto de miocardio, ictus, enfermedad renal y otros problemas. FACTORES DE RIESGO Algunos factores de riesgo de hipertensin son controlables, pero otros no lo son.  Entre los factores de riesgo que usted no puede controlar, se incluyen los siguientes:   La raza. El riesgo es mayor para las personas afroamericanas.  La edad. Los riesgos aumentan con la edad.  El sexo. Antes de los 45aos, los hombres corren ms riesgo que las mujeres. Despus de los 65aos, las mujeres corren ms riesgo que los hombres. Entre   los factores de riesgo que usted puede controlar, se incluyen los siguientes:  No hacer la cantidad suficiente de actividad fsica o ejercicio.  Tener sobrepeso.  Consumir mucha grasa, azcar, caloras o sal en la dieta.  Beber alcohol en exceso. SIGNOS Y SNTOMAS Por  lo general, la hipertensin no causa signos o sntomas. La hipertensin arterial demasiado alta (crisis hipertensiva) puede causar dolor de cabeza, ansiedad, falta de aire y hemorragia nasal. DIAGNSTICO Para detectar si usted tiene hipertensin, el mdico le medir la presin arterial mientras est sentado, con el brazo levantado a la altura del corazn. Debe medirla al menos dos veces en el mismo brazo. Determinadas condiciones pueden causar una diferencia de presin arterial entre el brazo izquierdo y el derecho. El hecho de tener una sola lectura de la presin arterial ms alta que lo normal no significa que necesita un tratamiento. Si no est claro si tiene hipertensin arterial, es posible que se le pida que regrese otro da para volver a controlarle la presin arterial. O bien se le puede pedir que se controle la presin arterial en su casa durante 1 o ms meses. TRATAMIENTO El tratamiento de la hipertensin arterial incluye hacer cambios en el estilo de vida y, posiblemente, tomar medicamentos. Un estilo de vida saludable puede ayudar a bajar la presin arterial alta. Quiz deba cambiar algunos hbitos. Los cambios en el estilo de vida pueden incluir lo siguiente:  Seguir la dieta DASH. Esta dieta tiene un alto contenido de frutas, verduras y cereales integrales. Incluye poca cantidad de sal, carnes rojas y azcares agregados.  Mantenga el consumo de sodio por debajo de 2300 mg por da.  Realizar al menos entre 30 y 45 minutos de ejercicio aerbico, 4 veces por semana como mnimo.  Perder peso, si es necesario.  No fumar.  Limitar el consumo de bebidas alcohlicas.  Aprender formas de reducir el estrs. El mdico puede recetarle medicamentos si los cambios en el estilo de vida no son suficientes para lograr controlar la presin arterial y si una de las siguientes afirmaciones es verdadera:  Tiene entre 18 y 59 aos y su presin arterial sistlica est por encima de 140.  Tiene 60  aos o ms y su presin arterial sistlica est por encima de 150.  Su presin arterial diastlica est por encima de 90.  Tiene diabetes y su presin arterial sistlica est por encima de 140 o su presin arterial diastlica est por encima de 90.  Tiene una enfermedad renal y su presin arterial est por encima de 140/90.  Tiene una enfermedad cardaca y su presin arterial est por encima de 140/90. La presin arterial deseada puede variar en funcin de las enfermedades, la edad y otros factores personales. INSTRUCCIONES PARA EL CUIDADO EN EL HOGAR  Haga que le midan de nuevo la presin arterial segn las indicaciones del mdico.  Tome los medicamentos solamente como se lo haya indicado el mdico. Siga cuidadosamente las indicaciones. Los medicamentos para la presin arterial deben tomarse segn las indicaciones. Los medicamentos pierden eficacia al omitir las dosis. El hecho de omitir las dosis tambin aumenta el riesgo de otros problemas.  No fume.  Contrlese la presin arterial en su casa segn las indicaciones del mdico. SOLICITE ATENCIN MDICA SI:   Piensa que tiene una reaccin alrgica a los medicamentos.  Tiene mareos o dolores de cabeza con recurrencia.  Tiene hinchazn en los tobillos.  Tiene problemas de visin. SOLICITE ATENCIN MDICA DE INMEDIATO SI:  Siente un dolor de cabeza intenso   o confusin.  Siente debilidad inusual, adormecimiento o que Geneticist, molecular.  Siente dolor intenso en el pecho o en el abdomen.  Vomita repetidas veces.  Tiene dificultad para respirar. ASEGRESE DE QUE:   Comprende estas instrucciones.  Controlar su afeccin.  Recibir ayuda de inmediato si no mejora o si empeora.   Esta informacin no tiene Marine scientist el consejo del mdico. Asegrese de hacerle al mdico cualquier pregunta que tenga.   Document Released: 10/17/2005 Document Revised: 03/03/2015 Elsevier Interactive Patient Education 2016 [place] (Postmenopausal Bleeding) El sangrado postmenopusico es el sangrado que tiene una mujer despus de haber entrado en la menopausia. La menopausia es el final de la edad frtil de la [place]. Despus de la menopausia, una mujer deja de ovular y de tener perodos [place].  La hemorragia postmenopusica puede tener varias causas. Cualquier tipo de hemorragia postmenopusica, incluso si parece ser un perodo menstrual tpico, es preocupante. Esto lo evaluar el mdico. Cualquier tratamiento depender de la causa del sangrado. INSTRUCCIONES PARA EL CUIDADO EN EL HOGAR Controle su afeccin para ver si hay cambios. Las siguientes indicaciones ayudarn a Chief Strategy Officer que pueda sentir:  Evite las duchas vaginales y el uso de tampones segn lo que le indique su mdico.  [place] compresas con frecuencia.  Hgase exmenes plvicos regulares y pruebas de [place]apanicolaou.  Cumpla con todas las visitas de control, segn le indique su mdico. SOLICITE ATENCIN MDICA SI:   El sangrado dura ms de 1 semana.  Siente dolor abdominal.  Tiene hemorragias [place]. SOLICITE ATENCIN MDICA DE INMEDIATO SI:   Usted tiene fiebre, escalofros, mareos, dolor de cabeza, dolores musculares y Pensions consultant.  Tiene dolor con el sangrado.  Elimina cogulos de [place].  Tiene sangrado y necesita ms de un apsito por hora.  Siente que va a desmayarse. ASEGRESE DE QUE:  Comprende estas instrucciones.  Controlar su afeccin.  Recibir ayuda de inmediato si no mejora o si empeora.   Esta informacin no tiene Marine scientist el consejo del mdico. Asegrese de hacerle al mdico cualquier pregunta que tenga.   Document Released: 04/04/2008 Document Revised: 08/07/2013 Elsevier Interactive Patient Education [employer].

## 2015-10-09 LAB — LIPID PANEL
CHOLESTEROL: 132 mg/dL (ref 125–200)
HDL: 40 mg/dL — ABNORMAL LOW (ref >=46)
LDL Cholesterol: 48 mg/dL (ref <130)
Total CHOL/HDL Ratio: 3.3 Ratio (ref <=5.0)
Triglycerides: 219 mg/dL — ABNORMAL HIGH (ref <150)
VLDL: 44 mg/dL — AB (ref <30)

## 2015-10-09 NOTE — Telephone Encounter (Signed)
-----   Message from Tresa Garter, MD sent at 10/09/2015 12:32 PM EST ----- Please inform patient that her laboratory results are mostly normal. Cholesterol level has improved significantly. Continue current medications.

## 2015-10-09 NOTE — Telephone Encounter (Signed)
This RN out on FMLA beginning 07/31/15 and did not return until 09/28/15.  Patient seen by MD in between and needs met.

## 2015-10-09 NOTE — Telephone Encounter (Signed)
Medical Assistant used [employer] to contact patient.  Interpreter Name: Huey Bienenstock #: [id number]  Patient verified DOB Patient informed of lab results being mostly normal.  Patient informed of cholesterol improving significantly.  Patient advised to continue current medication. Patient expressed her understanding and had no further questions.

## 2015-10-14 ENCOUNTER — Ambulatory Visit (HOSPITAL_COMMUNITY)
Admission: RE | Admit: 2015-10-14 | Discharge: 2015-10-14 | Disposition: A | Discharge status: Home / Self Care | Payer: Self-pay | Source: Ambulatory Visit | Attending: Internal Medicine | Admitting: Internal Medicine

## 2015-10-14 DIAGNOSIS — N950 Postmenopausal bleeding: Secondary | ICD-10-CM | POA: Insufficient documentation

## 2015-10-14 DIAGNOSIS — D649 Anemia, unspecified: Secondary | ICD-10-CM | POA: Insufficient documentation

## 2015-10-16 ENCOUNTER — Other Ambulatory Visit: Payer: Self-pay | Admitting: Internal Medicine

## 2015-10-16 DIAGNOSIS — N950 Postmenopausal bleeding: Secondary | ICD-10-CM

## 2015-10-19 ENCOUNTER — Telehealth: Payer: Self-pay | Admitting: *Deleted

## 2015-10-19 NOTE — Telephone Encounter (Signed)
-----   Message from Tresa Garter, MD sent at 10/16/2015 12:37 PM EST ----- Please inform patient that pelvic ultrasound shows possibility of polyps that needs biopsy. We will refer to gynecologist for further management.

## 2015-10-19 NOTE — Telephone Encounter (Signed)
Medical Assistant used [employer] to contact patient.  Interpreter Name: Edsel Petrin #: [id number]  Patient verified DOB Patient informed of pelvic ultrasound showing possible polyps which need biopsy.  Patient informed of being referred to gynecologist and to expect a call from our specialist of the doctors office informing her of the appointment time and date. Patient expressed her understanding and had no further questions.

## 2015-11-03 ENCOUNTER — Encounter: Payer: Self-pay | Admitting: Obstetrics & Gynecology

## 2015-11-06 ENCOUNTER — Other Ambulatory Visit: Payer: Self-pay

## 2015-11-06 DIAGNOSIS — Z1231 Encounter for screening mammogram for malignant neoplasm of breast: Secondary | ICD-10-CM

## 2015-11-09 ENCOUNTER — Encounter (HOSPITAL_COMMUNITY): Payer: Self-pay | Admitting: Family Medicine

## 2015-11-09 ENCOUNTER — Emergency Department (HOSPITAL_COMMUNITY): Payer: Self-pay

## 2015-11-09 ENCOUNTER — Emergency Department (HOSPITAL_COMMUNITY)
Admission: EM | Admit: 2015-11-09 | Discharge: 2015-11-09 | Disposition: A | Discharge status: Home / Self Care | Payer: Self-pay | Attending: Emergency Medicine | Admitting: Emergency Medicine

## 2015-11-09 DIAGNOSIS — Y9289 Other specified places as the place of occurrence of the external cause: Secondary | ICD-10-CM | POA: Insufficient documentation

## 2015-11-09 DIAGNOSIS — Y9389 Activity, other specified: Secondary | ICD-10-CM | POA: Insufficient documentation

## 2015-11-09 DIAGNOSIS — D649 Anemia, unspecified: Secondary | ICD-10-CM | POA: Insufficient documentation

## 2015-11-09 DIAGNOSIS — W19XXXA Unspecified fall, initial encounter: Secondary | ICD-10-CM

## 2015-11-09 DIAGNOSIS — S82392A Other fracture of lower end of left tibia, initial encounter for closed fracture: Secondary | ICD-10-CM | POA: Insufficient documentation

## 2015-11-09 DIAGNOSIS — I10 Essential (primary) hypertension: Secondary | ICD-10-CM | POA: Insufficient documentation

## 2015-11-09 DIAGNOSIS — Z79899 Other long term (current) drug therapy: Secondary | ICD-10-CM | POA: Insufficient documentation

## 2015-11-09 DIAGNOSIS — W002XXA Other fall from one level to another due to ice and snow, initial encounter: Secondary | ICD-10-CM | POA: Insufficient documentation

## 2015-11-09 DIAGNOSIS — S82432A Displaced oblique fracture of shaft of left fibula, initial encounter for closed fracture: Secondary | ICD-10-CM | POA: Insufficient documentation

## 2015-11-09 DIAGNOSIS — S82202A Unspecified fracture of shaft of left tibia, initial encounter for closed fracture: Secondary | ICD-10-CM

## 2015-11-09 DIAGNOSIS — Y999 Unspecified external cause status: Secondary | ICD-10-CM | POA: Insufficient documentation

## 2015-11-09 DIAGNOSIS — R200 Anesthesia of skin: Secondary | ICD-10-CM | POA: Insufficient documentation

## 2015-11-09 DIAGNOSIS — Z87891 Personal history of nicotine dependence: Secondary | ICD-10-CM | POA: Insufficient documentation

## 2015-11-09 DIAGNOSIS — M1990 Unspecified osteoarthritis, unspecified site: Secondary | ICD-10-CM | POA: Insufficient documentation

## 2015-11-09 DIAGNOSIS — Z791 Long term (current) use of non-steroidal anti-inflammatories (NSAID): Secondary | ICD-10-CM | POA: Insufficient documentation

## 2015-11-09 DIAGNOSIS — Z8719 Personal history of other diseases of the digestive system: Secondary | ICD-10-CM | POA: Insufficient documentation

## 2015-11-09 DIAGNOSIS — S99912A Unspecified injury of left ankle, initial encounter: Secondary | ICD-10-CM | POA: Insufficient documentation

## 2015-11-09 MED ORDER — TRAMADOL HCL 50 MG PO TABS: 50.0000 mg | ORAL_TABLET | Freq: Four times a day (QID) | ORAL | Status: DC | PRN

## 2015-11-09 NOTE — ED Provider Notes (Signed)
CSN: [id number]     Arrival date & time 11/09/15  1450 History  By signing my name below, I, Eustaquio Maize, attest that this documentation has been prepared under the direction and in the presence of Harlene Ramus, PA-C. Electronically Signed: Eustaquio Maize, ED Scribe. 11/09/2015. 5:25 PM.   Chief Complaint  Patient presents with  . Fall  . Ankle Pain   The history is provided by the patient. No language interpreter was used.     HPI Comments: Susan Lopez is a 55 y.o. female who presents to the Emergency Department complaining of sudden onset, intermittent, severe, left medial and lateral ankle pain and swelling s/p ground level fall that occurred approximately 3 hours ago. Patient is Spanish-speaking and translator was used during interview/exam. Pt states that she slipped on ice, causing her to fall and land on the lateral side of her left ankle. Endorses swelling to her left ankle. No head injury or LOC. Her pain is only present with movement of the foot or ankle. She is also complaining of numbness to her left foot, mild left knee pain, and numbness to left knee. Pt took Aspirin earlier today with some relief. Denies any other associated symptoms. Pt is not on any anticoagulants.    Past Medical History  Diagnosis Date  . Iron deficiency anemia   . Hypertension   . Arthritis   . Gallstones   . Hemorrhoids, external 10-21-11    occ. bothersome  . Calculus of gallbladder with acute and chronic cholecystitis without obstruction, s/p lap chole 27Dec2012 09/19/2011  . Bone lesion 10/04/2013  . Mammogram abnormal 10/04/2013   Past Surgical History  Procedure Laterality Date  . Cesarean section  1985, 1992  . Eye surgery  1`12-21-10    bil. for tissue growth-laser surgery  . Cholecystectomy  10/27/2011    Procedure: LAPAROSCOPIC CHOLECYSTECTOMY WITH INTRAOPERATIVE CHOLANGIOGRAM;  Surgeon: Adin Hector, MD;  Location: [facility];  Service: General;  Laterality: N/A;  Laparoscopic  Chole w/ IOC Single Site   Family History  Problem Relation Age of Onset  . Colon cancer Neg Hx   . Hypertension Mother   . Cancer Mother   . Hypertension Father    Social History  Substance Use Topics  . Smoking status: Former Smoker -- 0.25 packs/day for 10 years    Quit date: 10/20/2004  . Smokeless tobacco: Never Used  . Alcohol Use: No   OB History    Gravida Para Term Preterm AB TAB SAB Ectopic Multiple Living   4 3 3  0 1 0 1 0 1 2     Review of Systems  Musculoskeletal: Positive for joint swelling and arthralgias ( Left ankle. Left knee. ).  Neurological: Positive for numbness.   Allergies  Hydrocodone and Oxycodone  Home Medications   Prior to Admission medications   Medication Sig Start Date End Date Taking? Authorizing Provider  atorvastatin (LIPITOR) 20 MG tablet Take 1 tablet (20 mg total) by mouth daily. 10/08/15   Tresa Garter, MD  cyclobenzaprine (FLEXERIL) 10 MG tablet Take 1 tablet (10 mg total) by mouth 3 (three) times daily as needed for muscle spasms. 10/08/15   Tresa Garter, MD  docusate sodium (COLACE) 100 MG capsule Take 1 capsule (100 mg total) by mouth 2 (two) times daily as needed for mild constipation. 10/08/15   Tresa Garter, MD  ferrous sulfate 325 (65 FE) MG tablet Take 1 tablet (325 mg total) by mouth daily with breakfast. 10/08/15  Tresa Garter, MD  gabapentin (NEURONTIN) 100 MG capsule Take 1 capsule (100 mg total) by mouth 2 (two) times daily. 03/09/15   Lorayne Marek, MD  meloxicam (MOBIC) 15 MG tablet Take 1 tablet (15 mg total) by mouth daily. 10/08/15   Tresa Garter, MD  Multiple Vitamin (MULTIVITAMIN WITH MINERALS) TABS tablet Take 1 tablet by mouth daily.    Historical Provider, MD  naproxen (NAPROSYN) 500 MG tablet Take 1 tablet (500 mg total) by mouth 2 (two) times daily with a meal. 08/12/15   Willeen Niece, MD  omeprazole (PRILOSEC) 40 MG capsule Take 1 capsule (40 mg total) by mouth daily. 10/08/15    Tresa Garter, MD  PARoxetine (PAXIL) 20 MG tablet Take 1 tablet (20 mg total) by mouth daily. 10/08/15   Tresa Garter, MD  Polyvinyl Alcohol-Povidone (REFRESH OP) Place 1 drop into both eyes 2 (two) times daily.    Historical Provider, MD   Triage Vitals:  BP 117/82 mmHg  Pulse 94  Temp(Src) 98.4 F (36.9 C)  Resp 18  SpO2 96%  LMP 10/08/2015   Physical Exam  Constitutional: She is oriented to person, place, and time. She appears well-developed and well-nourished. No distress.  HENT:  Head: Normocephalic and atraumatic.  Eyes: Conjunctivae and EOM are normal.  Neck: Normal range of motion. Neck supple. No tracheal deviation present.  Cardiovascular: Normal rate, regular rhythm, normal heart sounds and intact distal pulses.   Pulmonary/Chest: Effort normal and breath sounds normal. No respiratory distress.  Abdominal: Soft. Bowel sounds are normal. She exhibits no distension. There is no tenderness.  Musculoskeletal: She exhibits tenderness.  Moderate swelling and tenderness to left lateral and medial malleolus  Decreased ROM of left ankle due to pain and swelling 2+ DP pulses Sensation intact Cap refill < 2 seconds Pt able to wiggle toes Full ROM of left knee and hip No tenderness at proximal tib fib or left knee  Neurological: She is alert and oriented to person, place, and time.  Skin: Skin is warm and dry.  Psychiatric: She has a normal mood and affect. Her behavior is normal.  Nursing note and vitals reviewed.   ED Course  Procedures (including critical care time)  DIAGNOSTIC STUDIES: Oxygen Saturation is 96% on RA, normal by my interpretation.    COORDINATION OF CARE: 4:58 PM-Discussed treatment plan with pt at bedside and pt agreed to plan.   Labs Review Labs Reviewed - No data to display  Imaging Review Dg Ankle Complete Left  11/09/2015  CLINICAL DATA:  Left ankle pain, fell on ice EXAM: LEFT ANKLE COMPLETE - 3+ VIEW COMPARISON:  None. FINDINGS:  Three views of the left ankle submitted. There is oblique mild displaced fracture in distal left fibula. Mild displaced fracture of distal tibia medial malleolus. There is mild disruption of ankle mortise with mild medial displacement of distal tibia. Soft tissue swelling is noted around the ankle. There is plantar and posterior spurring of calcaneus. IMPRESSION: Oblique mild displaced fracture in distal fibula. Mild displaced comminuted fracture in distal tibia medial malleolus. Mild disruption of ankle mortise with mild medial displacement of distal tibia and mild widening of medial tibiotalar joint space. Electronically Signed   By: Lahoma Crocker M.D.   On: 11/09/2015 15:59   Dg Knee Complete 4 Views Left  11/09/2015  CLINICAL DATA:  Slip and fall on ice. Left ankle pain and swelling extending to the knee. EXAM: LEFT KNEE - COMPLETE 4+ VIEW COMPARISON:  None. FINDINGS: There is no evidence of fracture, dislocation, or joint effusion. There is no evidence of arthropathy or other focal bone abnormality. Soft tissues are unremarkable. IMPRESSION: Negative two views of the left knee. Electronically Signed   By: San Morelle M.D.   On: 11/09/2015 16:00   I have personally reviewed and evaluated these images as part of my medical decision-making.  Filed Vitals:   11/09/15 1527  BP: 117/82  Pulse: 94  Temp: 98.4 F (36.9 C)  Resp: 18     MDM   Final diagnoses:  Fall, initial encounter  Tibial fracture, left, closed, initial encounter    Patient presents status post fall with reported left ankle pain and swelling. Moderate swelling noted to medial and lateral malleolus of left ankle, decreased range of motion of left ankle due to pain and swelling. Left lower extremity appears to be otherwise neurovascularly intact. Patient X-Ray positive for oblique mild displaced fracture of distal fibula, mild displaced comminuted fracture of distal tibia medial malleolus with mild disruption of ankle  mortise. Left knee x-ray negative. Short leg posterior splint placed in the ED  Pt advised to follow up with orthopedics. Patient given splint and crutches while in ED and advised to remain nonweightbearing until following up with orthopedist, conservative therapy recommended and discussed. Patient will be discharged home & is agreeable with above plan. Returns precautions discussed. Pt appears safe for discharge.  I personally performed the services described in this documentation, which was scribed in my presence. The recorded information has been reviewed and is accurate.      Chesley Noon [place], [place] 11/09/15 1737  Dorie Rank, MD 11/10/15 Dyann Kief

## 2015-11-09 NOTE — Discharge Instructions (Signed)
Take your medications as prescribed. I also recommend elevating her leg and applying ice to help with pain and swelling. She may also take 600 mg ibuprofen 3 times daily for pain relief. You also need to use your crutches and be non-weight-bearing until you follow up with orthopedist. Call the orthopedist office listed above to schedule a follow-up appointment within the next week. Return to the emergency department if symptoms worsen or new onset of fever, redness, swelling, warmth, numbness, tingling, weakness.

## 2015-11-09 NOTE — ED Notes (Signed)
Ortho paged. 

## 2015-11-09 NOTE — ED Notes (Signed)
Ortho in route.  

## 2015-11-09 NOTE — Progress Notes (Signed)
Orthopedic Tech Progress Note Patient Details:  Susan Lopez 07-08-1961 [id number]  Ortho Devices Type of Ortho Device: Ace wrap, Post (short leg) splint, Stirrup splint Ortho Device/Splint Interventions: Application   [name] 11/09/2015, 5:41 PM

## 2015-11-09 NOTE — ED Notes (Signed)
Pt here for slip and fall on the ice PTA. Pt having left ankle and left knee pain.

## 2015-11-11 ENCOUNTER — Other Ambulatory Visit (HOSPITAL_COMMUNITY): Payer: Self-pay | Admitting: Orthopaedic Surgery

## 2015-11-12 ENCOUNTER — Encounter (HOSPITAL_COMMUNITY): Payer: Self-pay | Admitting: *Deleted

## 2015-11-12 ENCOUNTER — Other Ambulatory Visit (HOSPITAL_COMMUNITY): Payer: Self-pay | Admitting: Orthopaedic Surgery

## 2015-11-12 MED ORDER — CEFAZOLIN SODIUM-DEXTROSE 2-3 GM-% IV SOLR
2.0000 g | INTRAVENOUS | Status: AC
  Administered 2015-11-13: 2 g via INTRAVENOUS
  Filled 2015-11-12: qty 50

## 2015-11-12 NOTE — Progress Notes (Addendum)
Through [employer] ID # [id number] patient reports having chest pressure and a fast heart rate.  Patient states that the last time she had this sensation was 3 days, pain is a mild pressure, a little shortness of breath, I do not feel like I 'm going to pass out.  I told my doctor and they said to keep taking my blood pressure medication.  Dr Ermalene Postin notified of above and patient will be evaluated in Pre-op.

## 2015-11-12 NOTE — Progress Notes (Signed)
SDW call using [employer] :ID [id number].

## 2015-11-13 ENCOUNTER — Ambulatory Visit (HOSPITAL_COMMUNITY): Payer: Self-pay | Admitting: Anesthesiology

## 2015-11-13 ENCOUNTER — Observation Stay (HOSPITAL_COMMUNITY)
Admission: RE | Admit: 2015-11-13 | Discharge: 2015-11-14 | Disposition: A | Discharge status: Home / Self Care | Payer: Self-pay | Source: Ambulatory Visit | Attending: Orthopaedic Surgery | Admitting: Orthopaedic Surgery

## 2015-11-13 ENCOUNTER — Ambulatory Visit (HOSPITAL_COMMUNITY): Payer: Self-pay

## 2015-11-13 ENCOUNTER — Encounter (HOSPITAL_COMMUNITY)
Admission: RE | Disposition: A | Discharge status: Home / Self Care | Payer: Self-pay | Source: Ambulatory Visit | Attending: Orthopaedic Surgery

## 2015-11-13 ENCOUNTER — Encounter (HOSPITAL_COMMUNITY): Payer: Self-pay | Admitting: *Deleted

## 2015-11-13 DIAGNOSIS — Z419 Encounter for procedure for purposes other than remedying health state, unspecified: Secondary | ICD-10-CM

## 2015-11-13 DIAGNOSIS — X58XXXA Exposure to other specified factors, initial encounter: Secondary | ICD-10-CM | POA: Insufficient documentation

## 2015-11-13 DIAGNOSIS — I10 Essential (primary) hypertension: Secondary | ICD-10-CM | POA: Insufficient documentation

## 2015-11-13 DIAGNOSIS — S82842A Displaced bimalleolar fracture of left lower leg, initial encounter for closed fracture: Principal | ICD-10-CM

## 2015-11-13 DIAGNOSIS — M1990 Unspecified osteoarthritis, unspecified site: Secondary | ICD-10-CM | POA: Insufficient documentation

## 2015-11-13 DIAGNOSIS — Z8781 Personal history of (healed) traumatic fracture: Secondary | ICD-10-CM

## 2015-11-13 DIAGNOSIS — Z791 Long term (current) use of non-steroidal anti-inflammatories (NSAID): Secondary | ICD-10-CM | POA: Insufficient documentation

## 2015-11-13 DIAGNOSIS — Z79899 Other long term (current) drug therapy: Secondary | ICD-10-CM | POA: Insufficient documentation

## 2015-11-13 DIAGNOSIS — K219 Gastro-esophageal reflux disease without esophagitis: Secondary | ICD-10-CM | POA: Insufficient documentation

## 2015-11-13 DIAGNOSIS — Z6830 Body mass index (BMI) 30.0-30.9, adult: Secondary | ICD-10-CM | POA: Insufficient documentation

## 2015-11-13 DIAGNOSIS — Z9889 Other specified postprocedural states: Secondary | ICD-10-CM

## 2015-11-13 DIAGNOSIS — S93432A Sprain of tibiofibular ligament of left ankle, initial encounter: Secondary | ICD-10-CM | POA: Insufficient documentation

## 2015-11-13 DIAGNOSIS — Z87891 Personal history of nicotine dependence: Secondary | ICD-10-CM | POA: Insufficient documentation

## 2015-11-13 DIAGNOSIS — E669 Obesity, unspecified: Secondary | ICD-10-CM | POA: Insufficient documentation

## 2015-11-13 HISTORY — DX: Depression, unspecified: F32.A

## 2015-11-13 HISTORY — DX: Anxiety disorder, unspecified: F41.9

## 2015-11-13 HISTORY — DX: Major depressive disorder, single episode, unspecified: F32.9

## 2015-11-13 HISTORY — PX: ORIF ANKLE FRACTURE: SHX5408

## 2015-11-13 HISTORY — DX: Adverse effect of unspecified anesthetic, initial encounter: T41.45XA

## 2015-11-13 HISTORY — DX: Other complications of anesthesia, initial encounter: T88.59XA

## 2015-11-13 LAB — BASIC METABOLIC PANEL
Anion gap: 8 (ref 5–15)
BUN: 8 mg/dL (ref 6–20)
CHLORIDE: 108 mmol/L (ref 101–111)
CO2: 24 mmol/L (ref 22–32)
CREATININE: 0.72 mg/dL (ref 0.44–1.00)
Calcium: 9.6 mg/dL (ref 8.9–10.3)
GFR calc Af Amer: >60 mL/min (ref >60)
GFR calc non Af Amer: >60 mL/min (ref >60)
Glucose, Bld: 128 mg/dL — ABNORMAL HIGH (ref 65–99)
Potassium: 3.7 mmol/L (ref 3.5–5.1)
Sodium: 140 mmol/L (ref 135–145)

## 2015-11-13 LAB — CBC
HCT: 42.8 % (ref 36.0–46.0)
Hemoglobin: 14.4 g/dL (ref 12.0–15.0)
MCH: 31.1 pg (ref 26.0–34.0)
MCHC: 33.6 g/dL (ref 30.0–36.0)
MCV: 92.4 fL (ref 78.0–100.0)
PLATELETS: 230 10*3/uL (ref 150–400)
RBC: 4.63 MIL/uL (ref 3.87–5.11)
RDW: 12.5 % (ref 11.5–15.5)
WBC: 7.2 10*3/uL (ref 4.0–10.5)

## 2015-11-13 LAB — SURGICAL PCR SCREEN
MRSA, PCR: NEGATIVE
STAPHYLOCOCCUS AUREUS: NEGATIVE

## 2015-11-13 SURGERY — OPEN REDUCTION INTERNAL FIXATION (ORIF) ANKLE FRACTURE
Anesthesia: General | Site: Ankle | Laterality: Left

## 2015-11-13 MED ORDER — MIDAZOLAM HCL 2 MG/2ML IJ SOLN
INTRAMUSCULAR | Status: AC
  Filled 2015-11-13: qty 2

## 2015-11-13 MED ORDER — DOCUSATE SODIUM 100 MG PO CAPS
100.0000 mg | ORAL_CAPSULE | Freq: Two times a day (BID) | ORAL | Status: DC | PRN
Start: 2015-11-13 — End: 2015-11-14
  Filled 2015-11-13: qty 1

## 2015-11-13 MED ORDER — BUPIVACAINE HCL (PF) 0.25 % IJ SOLN
INTRAMUSCULAR | Status: AC
  Filled 2015-11-13: qty 30

## 2015-11-13 MED ORDER — DEXAMETHASONE SODIUM PHOSPHATE 4 MG/ML IJ SOLN
INTRAMUSCULAR | Status: DC | PRN
  Administered 2015-11-13: 4 mg via INTRAVENOUS

## 2015-11-13 MED ORDER — ASPIRIN EC 325 MG PO TBEC
325.0000 mg | DELAYED_RELEASE_TABLET | Freq: Two times a day (BID) | ORAL | Status: DC
  Administered 2015-11-13 – 2015-11-14 (×2): 325 mg via ORAL
  Filled 2015-11-13 (×2): qty 1

## 2015-11-13 MED ORDER — 0.9 % SODIUM CHLORIDE (POUR BTL) OPTIME
TOPICAL | Status: DC | PRN
  Administered 2015-11-13: 3000 mL

## 2015-11-13 MED ORDER — LISINOPRIL 10 MG PO TABS
10.0000 mg | ORAL_TABLET | Freq: Every day | ORAL | Status: DC
  Administered 2015-11-13: 10 mg via ORAL
  Filled 2015-11-13 (×2): qty 1

## 2015-11-13 MED ORDER — OXYCODONE-ACETAMINOPHEN 5-325 MG PO TABS: 1.0000 | ORAL_TABLET | ORAL | Status: DC | PRN

## 2015-11-13 MED ORDER — LIDOCAINE HCL (CARDIAC) 20 MG/ML IV SOLN
INTRAVENOUS | Status: DC | PRN
  Administered 2015-11-13: 5 mL via INTRATRACHEAL

## 2015-11-13 MED ORDER — FENTANYL CITRATE (PF) 100 MCG/2ML IJ SOLN: 25.0000 ug | INTRAMUSCULAR | Status: DC | PRN

## 2015-11-13 MED ORDER — GLYCOPYRROLATE 0.2 MG/ML IJ SOLN
INTRAMUSCULAR | Status: AC
  Filled 2015-11-13: qty 1

## 2015-11-13 MED ORDER — TRAMADOL HCL 50 MG PO TABS
50.0000 mg | ORAL_TABLET | Freq: Four times a day (QID) | ORAL | Status: DC | PRN
  Filled 2015-11-13: qty 1

## 2015-11-13 MED ORDER — ASPIRIN EC 325 MG PO TBEC: 325.0000 mg | DELAYED_RELEASE_TABLET | Freq: Two times a day (BID) | ORAL | Status: DC

## 2015-11-13 MED ORDER — PROPOFOL 10 MG/ML IV BOLUS
INTRAVENOUS | Status: AC
  Filled 2015-11-13: qty 20

## 2015-11-13 MED ORDER — OXYCODONE HCL 5 MG PO TABS
5.0000 mg | ORAL_TABLET | ORAL | Status: DC | PRN
  Filled 2015-11-13: qty 2

## 2015-11-13 MED ORDER — STERILE WATER FOR INJECTION IJ SOLN
INTRAMUSCULAR | Status: AC
  Filled 2015-11-13: qty 10

## 2015-11-13 MED ORDER — PANTOPRAZOLE SODIUM 40 MG PO TBEC
40.0000 mg | DELAYED_RELEASE_TABLET | Freq: Every day | ORAL | Status: DC
  Administered 2015-11-14: 40 mg via ORAL
  Filled 2015-11-13: qty 1

## 2015-11-13 MED ORDER — PHENYLEPHRINE HCL 10 MG/ML IJ SOLN
INTRAMUSCULAR | Status: DC | PRN
  Administered 2015-11-13 (×4): 80 ug via INTRAVENOUS

## 2015-11-13 MED ORDER — BUPIVACAINE HCL (PF) 0.25 % IJ SOLN: INTRAMUSCULAR | Status: DC | PRN

## 2015-11-13 MED ORDER — ATORVASTATIN CALCIUM 20 MG PO TABS
20.0000 mg | ORAL_TABLET | Freq: Every day | ORAL | Status: DC
  Administered 2015-11-13: 20 mg via ORAL
  Filled 2015-11-13: qty 1

## 2015-11-13 MED ORDER — ONDANSETRON HCL 4 MG PO TABS: 4.0000 mg | ORAL_TABLET | Freq: Four times a day (QID) | ORAL | Status: DC | PRN

## 2015-11-13 MED ORDER — GABAPENTIN 100 MG PO CAPS
100.0000 mg | ORAL_CAPSULE | Freq: Two times a day (BID) | ORAL | Status: DC
  Administered 2015-11-13 (×2): 100 mg via ORAL
  Filled 2015-11-13 (×2): qty 1

## 2015-11-13 MED ORDER — SENNOSIDES-DOCUSATE SODIUM 8.6-50 MG PO TABS: 1.0000 | ORAL_TABLET | Freq: Every evening | ORAL | Status: DC | PRN

## 2015-11-13 MED ORDER — ONDANSETRON HCL 4 MG/2ML IJ SOLN
INTRAMUSCULAR | Status: AC
  Filled 2015-11-13: qty 2

## 2015-11-13 MED ORDER — ONDANSETRON HCL 4 MG/2ML IJ SOLN
4.0000 mg | Freq: Four times a day (QID) | INTRAMUSCULAR | Status: DC | PRN
  Filled 2015-11-13: qty 2

## 2015-11-13 MED ORDER — DIPHENHYDRAMINE HCL 12.5 MG/5ML PO ELIX: 25.0000 mg | ORAL_SOLUTION | ORAL | Status: DC | PRN

## 2015-11-13 MED ORDER — OXYCODONE HCL 5 MG PO TABS: 5.0000 mg | ORAL_TABLET | ORAL | Status: DC | PRN

## 2015-11-13 MED ORDER — LIDOCAINE HCL (CARDIAC) 20 MG/ML IV SOLN
INTRAVENOUS | Status: AC
  Filled 2015-11-13: qty 5

## 2015-11-13 MED ORDER — PROPOFOL 10 MG/ML IV BOLUS
INTRAVENOUS | Status: DC | PRN
  Administered 2015-11-13: 100 mg via INTRAVENOUS
  Administered 2015-11-13 (×7): 10 mg via INTRAVENOUS
  Administered 2015-11-13: 50 mg via INTRAVENOUS

## 2015-11-13 MED ORDER — KETOROLAC TROMETHAMINE 30 MG/ML IJ SOLN
30.0000 mg | Freq: Four times a day (QID) | INTRAMUSCULAR | Status: DC | PRN
  Administered 2015-11-14 (×2): 30 mg via INTRAVENOUS
  Filled 2015-11-13 (×2): qty 1

## 2015-11-13 MED ORDER — MIDAZOLAM HCL 2 MG/2ML IJ SOLN
INTRAMUSCULAR | Status: AC
  Administered 2015-11-13: 2 mg
  Filled 2015-11-13: qty 2

## 2015-11-13 MED ORDER — PROMETHAZINE HCL 25 MG/ML IJ SOLN: 6.2500 mg | INTRAMUSCULAR | Status: DC | PRN

## 2015-11-13 MED ORDER — CEFAZOLIN SODIUM-DEXTROSE 2-3 GM-% IV SOLR
2.0000 g | Freq: Four times a day (QID) | INTRAVENOUS | Status: AC
Start: 2015-11-13 — End: 2015-11-14
  Administered 2015-11-13 – 2015-11-14 (×3): 2 g via INTRAVENOUS
  Filled 2015-11-13 (×3): qty 50

## 2015-11-13 MED ORDER — LACTATED RINGERS IV SOLN
INTRAVENOUS | Status: DC
  Administered 2015-11-13 (×2): via INTRAVENOUS

## 2015-11-13 MED ORDER — METHOCARBAMOL 750 MG PO TABS: 750.0000 mg | ORAL_TABLET | Freq: Two times a day (BID) | ORAL | Status: DC | PRN

## 2015-11-13 MED ORDER — PROPOFOL 10 MG/ML IV BOLUS
INTRAVENOUS | Status: AC
Start: 2015-11-13 — End: 2015-11-13
  Filled 2015-11-13: qty 40

## 2015-11-13 MED ORDER — PHENYLEPHRINE HCL 10 MG/ML IJ SOLN
10.0000 mg | INTRAVENOUS | Status: DC | PRN
  Administered 2015-11-13: 30 ug/min via INTRAVENOUS

## 2015-11-13 MED ORDER — ACETAMINOPHEN 325 MG PO TABS
650.0000 mg | ORAL_TABLET | Freq: Four times a day (QID) | ORAL | Status: DC | PRN
  Filled 2015-11-13: qty 2

## 2015-11-13 MED ORDER — EPHEDRINE SULFATE 50 MG/ML IJ SOLN
INTRAMUSCULAR | Status: AC
  Filled 2015-11-13: qty 1

## 2015-11-13 MED ORDER — ONDANSETRON HCL 4 MG PO TABS: 4.0000 mg | ORAL_TABLET | Freq: Three times a day (TID) | ORAL | Status: DC | PRN

## 2015-11-13 MED ORDER — EPHEDRINE SULFATE 50 MG/ML IJ SOLN
INTRAMUSCULAR | Status: DC | PRN
  Administered 2015-11-13 (×3): 5 mg via INTRAVENOUS

## 2015-11-13 MED ORDER — METOCLOPRAMIDE HCL 5 MG/ML IJ SOLN: 5.0000 mg | Freq: Three times a day (TID) | INTRAMUSCULAR | Status: DC | PRN

## 2015-11-13 MED ORDER — FENTANYL CITRATE (PF) 250 MCG/5ML IJ SOLN
INTRAMUSCULAR | Status: AC
  Filled 2015-11-13: qty 5

## 2015-11-13 MED ORDER — ACETAMINOPHEN 650 MG RE SUPP: 650.0000 mg | Freq: Four times a day (QID) | RECTAL | Status: DC | PRN

## 2015-11-13 MED ORDER — DEXAMETHASONE SODIUM PHOSPHATE 4 MG/ML IJ SOLN
INTRAMUSCULAR | Status: AC
  Filled 2015-11-13: qty 1

## 2015-11-13 MED ORDER — METHOCARBAMOL 500 MG PO TABS
500.0000 mg | ORAL_TABLET | Freq: Four times a day (QID) | ORAL | Status: DC | PRN
  Administered 2015-11-14: 500 mg via ORAL
  Filled 2015-11-13 (×2): qty 1

## 2015-11-13 MED ORDER — FENTANYL CITRATE (PF) 100 MCG/2ML IJ SOLN
INTRAMUSCULAR | Status: AC
  Administered 2015-11-13: 50 ug
  Filled 2015-11-13: qty 2

## 2015-11-13 MED ORDER — SODIUM CHLORIDE 0.9 % IV SOLN
INTRAVENOUS | Status: DC
  Administered 2015-11-13: 125 mL/h via INTRAVENOUS

## 2015-11-13 MED ORDER — GLYCOPYRROLATE 0.2 MG/ML IJ SOLN
INTRAMUSCULAR | Status: DC | PRN
  Administered 2015-11-13: 0.1 mg via INTRAVENOUS

## 2015-11-13 MED ORDER — MORPHINE SULFATE (PF) 2 MG/ML IV SOLN
1.0000 mg | INTRAVENOUS | Status: DC | PRN
  Filled 2015-11-13: qty 1

## 2015-11-13 MED ORDER — PHENYLEPHRINE 40 MCG/ML (10ML) SYRINGE FOR IV PUSH (FOR BLOOD PRESSURE SUPPORT)
PREFILLED_SYRINGE | INTRAVENOUS | Status: AC
  Filled 2015-11-13: qty 10

## 2015-11-13 MED ORDER — METOCLOPRAMIDE HCL 5 MG PO TABS: 5.0000 mg | ORAL_TABLET | Freq: Three times a day (TID) | ORAL | Status: DC | PRN

## 2015-11-13 MED ORDER — BUPIVACAINE-EPINEPHRINE (PF) 0.5% -1:200000 IJ SOLN
INTRAMUSCULAR | Status: DC | PRN
  Administered 2015-11-13: 30 mL via PERINEURAL

## 2015-11-13 MED ORDER — OXYCODONE HCL ER 10 MG PO T12A
10.0000 mg | EXTENDED_RELEASE_TABLET | Freq: Two times a day (BID) | ORAL | Status: DC
  Filled 2015-11-13: qty 1

## 2015-11-13 MED ORDER — CYCLOBENZAPRINE HCL 10 MG PO TABS
10.0000 mg | ORAL_TABLET | Freq: Three times a day (TID) | ORAL | Status: DC | PRN
  Administered 2015-11-14: 10 mg via ORAL
  Filled 2015-11-13: qty 1

## 2015-11-13 MED ORDER — METHOCARBAMOL 1000 MG/10ML IJ SOLN
500.0000 mg | Freq: Four times a day (QID) | INTRAVENOUS | Status: DC | PRN
  Filled 2015-11-13: qty 5

## 2015-11-13 SURGICAL SUPPLY — 78 items
BANDAGE ACE 4X5 VEL STRL LF (GAUZE/BANDAGES/DRESSINGS) ×2 IMPLANT
BANDAGE ACE 6X5 VEL STRL LF (GAUZE/BANDAGES/DRESSINGS) ×2 IMPLANT
BANDAGE ELASTIC 4 VELCRO ST LF (GAUZE/BANDAGES/DRESSINGS) IMPLANT
BANDAGE ELASTIC 6 VELCRO ST LF (GAUZE/BANDAGES/DRESSINGS) IMPLANT
BANDAGE ESMARK 6X9 LF (GAUZE/BANDAGES/DRESSINGS) IMPLANT
BIT DRILL 3.5X122MM AO FIT (BIT) ×2 IMPLANT
BIT DRILL CANN 2.7 (BIT) ×2
BIT DRILL CANN 2.7MM (BIT) ×1
BIT DRILL SRG 2.7XCANN AO CPLG (BIT) IMPLANT
BIT DRL SRG 2.7XCANN AO CPLNG (BIT) ×1
BLADE SURG 15 STRL LF DISP TIS (BLADE) ×1 IMPLANT
BLADE SURG 15 STRL SS (BLADE) ×3
BNDG CMPR 9X6 STRL LF SNTH (GAUZE/BANDAGES/DRESSINGS) ×1
BNDG COHESIVE 4X5 TAN STRL (GAUZE/BANDAGES/DRESSINGS) ×6 IMPLANT
BNDG COHESIVE 6X5 TAN STRL LF (GAUZE/BANDAGES/DRESSINGS) ×2 IMPLANT
BNDG ESMARK 6X9 LF (GAUZE/BANDAGES/DRESSINGS) ×3
CANISTER SUCT 3000ML PPV (MISCELLANEOUS) ×3 IMPLANT
COVER SURGICAL LIGHT HANDLE (MISCELLANEOUS) ×3 IMPLANT
CUFF TOURNIQUET SINGLE 34IN LL (TOURNIQUET CUFF) ×2 IMPLANT
CUFF TOURNIQUET SINGLE 44IN (TOURNIQUET CUFF) IMPLANT
DRAPE C-ARM 42X72 X-RAY (DRAPES) ×3 IMPLANT
DRAPE C-ARMOR (DRAPES) ×3 IMPLANT
DRAPE IMP U-DRAPE 54X76 (DRAPES) ×3 IMPLANT
DRAPE INCISE IOBAN 66X45 STRL (DRAPES) IMPLANT
DRAPE U-SHAPE 47X51 STRL (DRAPES) ×3 IMPLANT
DRILL 2.6X122MM WL AO SHAFT (BIT) ×2 IMPLANT
DRSG PAD ABDOMINAL 8X10 ST (GAUZE/BANDAGES/DRESSINGS) ×4 IMPLANT
DURAPREP 26ML APPLICATOR (WOUND CARE) ×5 IMPLANT
ELECT CAUTERY BLADE 6.4 (BLADE) ×3 IMPLANT
ELECT REM PT RETURN 9FT ADLT (ELECTROSURGICAL) ×3
ELECTRODE REM PT RTRN 9FT ADLT (ELECTROSURGICAL) ×1 IMPLANT
FACESHIELD WRAPAROUND (MASK) IMPLANT
FACESHIELD WRAPAROUND OR TEAM (MASK) ×1 IMPLANT
GAUZE SPONGE 4X4 12PLY STRL (GAUZE/BANDAGES/DRESSINGS) ×3 IMPLANT
GAUZE XEROFORM 1X8 LF (GAUZE/BANDAGES/DRESSINGS) ×2 IMPLANT
GAUZE XEROFORM 5X9 LF (GAUZE/BANDAGES/DRESSINGS) ×1 IMPLANT
GLOVE SKINSENSE NS SZ7.5 (GLOVE) ×4
GLOVE SKINSENSE STRL SZ7.5 (GLOVE) ×1 IMPLANT
GLOVE SURG SYN 7.5  E (GLOVE) ×2
GLOVE SURG SYN 7.5 E (GLOVE) ×1 IMPLANT
GLOVE SURG SYN 7.5 PF PI (GLOVE) ×2 IMPLANT
GOWN STRL REIN XL XLG (GOWN DISPOSABLE) ×3 IMPLANT
K-WIRE ORTHOPEDIC 1.4X150L (WIRE) ×6
KIT BASIN OR (CUSTOM PROCEDURE TRAY) ×3 IMPLANT
KIT ROOM TURNOVER OR (KITS) ×3 IMPLANT
KWIRE ORTHOPEDIC 1.4X150L (WIRE) IMPLANT
NDL HYPO 25GX1X1/2 BEV (NEEDLE) IMPLANT
NEEDLE HYPO 25GX1X1/2 BEV (NEEDLE) ×3 IMPLANT
NS IRRIG 1000ML POUR BTL (IV SOLUTION) ×3 IMPLANT
PACK ORTHO EXTREMITY (CUSTOM PROCEDURE TRAY) ×3 IMPLANT
PAD ARMBOARD 7.5X6 YLW CONV (MISCELLANEOUS) ×6 IMPLANT
PAD CAST 3X4 CTTN HI CHSV (CAST SUPPLIES) ×2 IMPLANT
PADDING CAST COTTON 3X4 STRL (CAST SUPPLIES) ×3
PADDING CAST COTTON 6X4 STRL (CAST SUPPLIES) ×3 IMPLANT
PLATE FIBULA 4H (Plate) ×2 IMPLANT
SCREW 3.5X10MM (Screw) ×2 IMPLANT
SCREW BONE 18 (Screw) ×2 IMPLANT
SCREW BONE 3.5X16MM (Screw) ×4 IMPLANT
SCREW BONE NON-LCKING 3.5X12MM (Screw) ×6 IMPLANT
SCREW CANNULATED 4.0X36MM FT (Screw) ×4 IMPLANT
SCREW LOCK 3.5X14 (Screw) ×4 IMPLANT
SCREW LOCKING 3.5X16MM (Screw) ×2 IMPLANT
SCREW NL 3.5X42MM (Screw) ×2 IMPLANT
SPLINT FIBERGLASS 3X35 (CAST SUPPLIES) ×2 IMPLANT
SPLINT FIBERGLASS 4X30 (CAST SUPPLIES) ×3 IMPLANT
SPONGE LAP 18X18 X RAY DECT (DISPOSABLE) ×2 IMPLANT
SUCTION FRAZIER TIP 10 FR DISP (SUCTIONS) ×3 IMPLANT
SUT ETHILON 3 0 PS 1 (SUTURE) ×6 IMPLANT
SUT VIC AB 0 CT1 27 (SUTURE) ×3
SUT VIC AB 0 CT1 27XBRD ANBCTR (SUTURE) IMPLANT
SUT VIC AB 2-0 CT1 27 (SUTURE)
SUT VIC AB 2-0 CT1 TAPERPNT 27 (SUTURE) IMPLANT
SYR CONTROL 10ML LL (SYRINGE) ×2 IMPLANT
TOWEL OR 17X24 6PK STRL BLUE (TOWEL DISPOSABLE) ×7 IMPLANT
TOWEL OR 17X26 10 PK STRL BLUE (TOWEL DISPOSABLE) ×6 IMPLANT
TUBE CONNECTING 12'X1/4 (SUCTIONS) ×1
TUBE CONNECTING 12X1/4 (SUCTIONS) ×2 IMPLANT
WATER STERILE IRR 1000ML POUR (IV SOLUTION) ×3 IMPLANT

## 2015-11-13 NOTE — Transfer of Care (Signed)
Immediate Anesthesia Transfer of Care Note  Patient: Susan Lopez  Procedure(s) Performed: Procedure(s): OPEN REDUCTION INTERNAL FIXATION (ORIF) LEFT BIMALLEOLAR ANKLE FRACTURE (Left)  Patient Location: PACU  Anesthesia Type:GA combined with regional for post-op pain  Level of Consciousness: awake, alert , oriented and patient cooperative  Airway & Oxygen Therapy: Patient Spontanous Breathing and Patient connected to nasal cannula oxygen  Post-op Assessment: Report given to RN, Post -op Vital signs reviewed and stable and Patient moving all extremities X 4  Post vital signs: Reviewed and stable  Last Vitals:  Filed Vitals:   11/13/15 0642  BP: 134/86  Pulse: 92  Temp: 37 C  Resp: 16    Complications: No apparent anesthesia complications

## 2015-11-13 NOTE — Anesthesia Procedure Notes (Addendum)
Anesthesia Regional Block:  Adductor canal block  Pre-Anesthetic Checklist: ,, timeout performed, Correct Patient, Correct Site, Correct Laterality, Correct Procedure, Correct Position, site marked, Risks and benefits discussed,  Surgical consent,  Pre-op evaluation,  At surgeon's request and post-op pain management  Laterality: Left  Prep: chloraprep       Needles:  Injection technique: Single-shot  Needle Type: Echogenic Needle     Needle Length: 9cm 9 cm Needle Gauge: 21 and 21 G    Additional Needles:  Procedures: ultrasound guided (picture in chart) Adductor canal block Narrative:  Injection made incrementally with aspirations every 5 mL.  Performed by: Personally  Anesthesiologist: Catalina Gravel  Additional Notes: No pain on injection. No increased resistance to injection. Injection made in 5cc increments.  Good needle visualization.  Patient tolerated procedure well.   Anesthesia Regional Block:  Popliteal block  Pre-Anesthetic Checklist: ,, timeout performed, Correct Patient, Correct Site, Correct Laterality, Correct Procedure, Correct Position, site marked, Risks and benefits discussed,  Surgical consent,  Pre-op evaluation,  At surgeon's request and post-op pain management  Laterality: Left  Prep: chloraprep       Needles:  Injection technique: Single-shot  Needle Type: Echogenic Needle     Needle Length: 9cm 9 cm Needle Gauge: 21 and 21 G    Additional Needles:  Procedures: ultrasound guided (picture in chart) Popliteal block Narrative:  Injection made incrementally with aspirations every 5 mL.  Performed by: Personally  Anesthesiologist: Catalina Gravel  Additional Notes: No pain on injection. No increased resistance to injection. Injection made in 5cc increments.  Good needle visualization.  Patient tolerated procedure well.   Procedure Name: LMA Insertion Date/Time: 11/13/2015 8:48 AM Performed by: Adalberto Ill Pre-anesthesia Checklist: Emergency Drugs available, Patient identified, Suction available and Patient being monitored Patient Re-evaluated:Patient Re-evaluated prior to inductionOxygen Delivery Method: Circle system utilized Preoxygenation: Pre-oxygenation with 100% oxygen Intubation Type: IV induction Ventilation: Mask ventilation without difficulty LMA: LMA inserted LMA Size: 4.0 Number of attempts: 1 Placement Confirmation: positive ETCO2 and breath sounds checked- equal and bilateral ETT to lip (cm): yes. Tube secured with: Tape Dental Injury: Teeth and Oropharynx as per pre-operative assessment

## 2015-11-13 NOTE — Anesthesia Preprocedure Evaluation (Addendum)
Anesthesia Evaluation  Patient identified by MRN, date of birth, ID band Patient awake    Reviewed: Allergy & Precautions, NPO status , Patient's Chart, lab work & pertinent test results  Airway Mallampati: II  TM Distance: >3 FB Neck ROM: Full    Dental  (+) Teeth Intact, Dental Advisory Given, Caps,    Pulmonary former smoker,    Pulmonary exam normal breath sounds clear to auscultation       Cardiovascular Exercise Tolerance: Good hypertension, Pt. on medications Normal cardiovascular exam Rhythm:Regular Rate:Normal     Neuro/Psych  Headaches, PSYCHIATRIC DISORDERS Anxiety Depression    GI/Hepatic Neg liver ROS, GERD  Medicated,  Endo/Other  Obesity   Renal/GU negative Renal ROS     Musculoskeletal  (+) Arthritis , Osteoarthritis,    Abdominal   Peds  Hematology negative hematology ROS (+)   Anesthesia Other Findings Day of surgery medications reviewed with the patient.  Able to achieve >4 METS without chest pain, SOB.  Reproductive/Obstetrics negative OB ROS                           Anesthesia Physical Anesthesia Plan  ASA: II  Anesthesia Plan: General   Post-op Pain Management: GA combined w/ Regional for post-op pain   Induction: Intravenous  Airway Management Planned: LMA  Additional Equipment:   Intra-op Plan:   Post-operative Plan: Extubation in OR  Informed Consent: I have reviewed the patients History and Physical, chart, labs and discussed the procedure including the risks, benefits and alternatives for the proposed anesthesia with the patient or authorized representative who has indicated his/her understanding and acceptance.   Dental advisory given  Plan Discussed with: CRNA  Anesthesia Plan Comments: (Risks/benefits of general anesthesia discussed with patient including risk of damage to teeth, lips, gum, and tongue, nausea/vomiting, allergic reactions to  medications, and the possibility of heart attack, stroke and death.  All patient questions answered.  Patient wishes to proceed.)        Anesthesia Quick Evaluation

## 2015-11-13 NOTE — H&P (Signed)
PREOPERATIVE H&P  Chief Complaint: left bimalleolar ankle fracture  HPI: Susan Lopez is a 55 y.o. female who presents for surgical treatment of left bimalleolar ankle fracture.  She denies any changes in medical history.  Past Medical History  Diagnosis Date  . Iron deficiency anemia   . Hypertension   . Arthritis   . Gallstones   . Hemorrhoids, external 10-21-11    occ. bothersome  . Calculus of gallbladder with acute and chronic cholecystitis without obstruction, s/p lap chole 27Dec2012 09/19/2011  . Bone lesion 10/04/2013  . Mammogram abnormal 10/04/2013  . Complication of anesthesia   . Dysrhythmia     "fast time"  . Depression   . Anxiety    Past Surgical History  Procedure Laterality Date  . Cesarean section  1985, 1992  . Cholecystectomy  10/27/2011    Procedure: LAPAROSCOPIC CHOLECYSTECTOMY WITH INTRAOPERATIVE CHOLANGIOGRAM;  Surgeon: Adin Hector, MD;  Location: [facility];  Service: General;  Laterality: N/A;  Laparoscopic Chole w/ IOC Single Site  . Eye surgery  1`12-21-10    bil. for tissue growth-laser surgery   Social History   Social History  . Marital Status: Single    Spouse Name: N/A  . Number of Children: N/A  . Years of Education: N/A   Social History Main Topics  . Smoking status: Former Smoker -- 0.25 packs/day for 10 years    Quit date: 10/20/2004  . Smokeless tobacco: Never Used  . Alcohol Use: No  . Drug Use: No  . Sexual Activity: Yes    Birth Control/ Protection: Post-menopausal   Other Topics Concern  . None   Social History Narrative   Family History  Problem Relation Age of Onset  . Colon cancer Neg Hx   . Hypertension Mother   . Cancer Mother   . Hypertension Father    Allergies  Allergen Reactions  . Hydrocodone Nausea And Vomiting  . Tramadol Nausea Only  . Oxycodone Rash   Prior to Admission medications   Medication Sig Start Date End Date Taking? Authorizing Provider  atorvastatin (LIPITOR) 20 MG  tablet Take 1 tablet (20 mg total) by mouth daily. 10/08/15  Yes Tresa Garter, MD  cyclobenzaprine (FLEXERIL) 10 MG tablet Take 1 tablet (10 mg total) by mouth 3 (three) times daily as needed for muscle spasms. 10/08/15  Yes Tresa Garter, MD  gabapentin (NEURONTIN) 100 MG capsule Take 1 capsule (100 mg total) by mouth 2 (two) times daily. 03/09/15  Yes Deepak Advani, MD  lisinopril (PRINIVIL,ZESTRIL) 10 MG tablet Take 1 tablet by mouth daily. 10/12/15  Yes Historical Provider, MD  meloxicam (MOBIC) 15 MG tablet Take 1 tablet (15 mg total) by mouth daily. 10/08/15  Yes Tresa Garter, MD  Multiple Vitamin (MULTIVITAMIN WITH MINERALS) TABS tablet Take 1 tablet by mouth daily.   Yes Historical Provider, MD  omeprazole (PRILOSEC) 40 MG capsule Take 1 capsule (40 mg total) by mouth daily. 10/08/15  Yes Tresa Garter, MD  Polyvinyl Alcohol-Povidone (REFRESH OP) Place 1 drop into both eyes 2 (two) times daily as needed.    Yes Historical Provider, MD  traMADol (ULTRAM) 50 MG tablet Take 1 tablet (50 mg total) by mouth every 6 (six) hours as needed. 11/09/15  Yes Nona Dell, PA-C  docusate sodium (COLACE) 100 MG capsule Take 1 capsule (100 mg total) by mouth 2 (two) times daily as needed for mild constipation. 10/08/15   Tresa Garter, MD  Positive ROS: All other systems have been reviewed and were otherwise negative with the exception of those mentioned in the HPI and as above.  Physical Exam: General: Alert, no acute distress Cardiovascular: No pedal edema Respiratory: No cyanosis, no use of accessory musculature GI: abdomen soft Skin: No lesions in the area of chief complaint Neurologic: Sensation intact distally Psychiatric: Patient is competent for consent with normal mood and affect Lymphatic: no lymphedema  MUSCULOSKELETAL: exam stable  Assessment: left bimalleolar ankle fracture  Plan: Plan for Procedure(s): OPEN REDUCTION INTERNAL FIXATION (ORIF)  LEFT BIMALLEOLAR ANKLE FRACTURE  The risks benefits and alternatives were discussed with the patient including but not limited to the risks of nonoperative treatment, versus surgical intervention including infection, bleeding, nerve injury,  blood clots, cardiopulmonary complications, morbidity, mortality, among others, and they were willing to proceed.   Marianna Payment, MD   11/13/2015 6:55 AM

## 2015-11-13 NOTE — Discharge Instructions (Signed)
° ° °  1. Keep splint clean and dry °2. Elevate foot above level of the heart °3. Take aspirin to prevent blood clots °4. Take pain meds as needed °5. Strict non weight bearing to operative extremity ° °

## 2015-11-13 NOTE — Progress Notes (Signed)
Through interpreter, patient states that about 3 days ago, she had felt some palpitations with chest pressure, drank water and burped. Did come back another day. She states she has h/o gastritis.

## 2015-11-13 NOTE — Anesthesia Postprocedure Evaluation (Signed)
Anesthesia Post Note  Patient: Susan Lopez  Procedure(s) Performed: Procedure(s) (LRB): OPEN REDUCTION INTERNAL FIXATION (ORIF) LEFT BIMALLEOLAR ANKLE FRACTURE (Left)  Patient location during evaluation: PACU Anesthesia Type: General and Regional Level of consciousness: awake and alert Pain management: pain level controlled Vital Signs Assessment: post-procedure vital signs reviewed and stable Respiratory status: spontaneous breathing, nonlabored ventilation, respiratory function stable and patient connected to nasal cannula oxygen Cardiovascular status: blood pressure returned to baseline and stable Postop Assessment: no signs of nausea or vomiting Anesthetic complications: no    Last Vitals:  Filed Vitals:   11/13/15 1257 11/13/15 1446  BP: 102/66 118/60  Pulse: 72 67  Temp:  36.9 C  Resp: 13 15    Last Pain:  Filed Vitals:   11/13/15 1446  PainSc: 0-No pain                 Catalina Gravel

## 2015-11-13 NOTE — Op Note (Signed)
   Date of Surgery: 11/13/2015  INDICATIONS: [name] is a 55 y.o.-year-old female who sustained a left ankle fracture; she was indicated for open reduction and internal fixation due to the displaced nature of the articular fracture and came to the operating room today for this procedure. The patient did consent to the procedure after discussion of the risks and benefits.  PREOPERATIVE DIAGNOSIS: left bimalleolar ankle fracture with syndesmosis injury  POSTOPERATIVE DIAGNOSIS: Same.  PROCEDURE:  1. Open treatment of left ankle fracture with internal fixation. Bimalleolar CPT [id number] 2. Open treatment of left ankle syndesmosis with internal fixation.    SURGEON: N. Eduard Roux, M.D.  ASSIST: April Green, RNFA.  ANESTHESIA:  general, regional  TOURNIQUET TIME: less than 60 minutes  IV FLUIDS AND URINE: See anesthesia.  ESTIMATED BLOOD LOSS: minimal mL.  IMPLANTS: [employer]   COMPLICATIONS: None.  DESCRIPTION OF PROCEDURE: The patient was brought to the operating room and placed supine on the operating table.  The patient had been signed prior to the procedure and this was documented. The patient had the anesthesia placed by the anesthesiologist.  A nonsterile tourniquet was placed on the upper thigh.  The prep verification and incision time-outs were performed to confirm that this was the correct patient, site, side and location. The patient had an SCD on the opposite lower extremity. The patient did receive antibiotics prior to the incision and was re-dosed during the procedure as needed at indicated intervals.  The patient had the lower extremity prepped and draped in the standard surgical fashion.  The extremity was exsanguinated using an esmarch bandage and the tourniquet was inflated to 300 mm Hg.  A lateral incision over the distal fibula was created. Full-thickness flaps were elevated off of the fibula. Superficial radial nerve was not encountered. The fracture was exposed.  Organized hematoma was removed. Fracture was reduced and clamped with tenaculum. A lag screw was placed from anterior to posterior. Clamp was removed and fracture remained reduced. A 4 hole distal fibula plate was placed in neutralization fashion. Nonlocking and locking screws were placed through the plate into the fibula. Unicortical locking screws were placed distally in order to avoid the ankle joint. We then turned our attention to the medial side. A longitudinal incision over the distal tibia was created. Saphenous neurovascular structures were encountered and protected. Entrapped periosteum was removed from the fracture site. Organized hematoma was removed. Fracture was reduced with a dental pick. 2 parallel K wires were advanced across the fracture. Fluoroscopy was used to confirm the placement. We then placed 2 fully threaded 36 mm 4.0 mm cannulated screws over the wire to hold the fracture. We then performed a stress test which showed widening of the medial clear space. We then placed a 42 mm tetra cortical syndesmotic screw with the syndesmosis reduced under fluoroscopic guidance. Final x-rays were taken. Wounds were thoroughly irrigated and closed in layer fashion. Sterile dressings were applied. The foot was immobilized in a short-leg splint. Patient tolerated the procedure well.  POSTOPERATIVE PLAN: [name] will remain nonweightbearing on this leg for approximately 6 weeks; [name] will return for suture removal in 2 weeks.  He will be immobilized in a short leg splint and then transitioned to a CAM walker at his first follow up appointment.  [name] will receive DVT prophylaxis based on other medications, activity level, and risk ratio of bleeding to thrombosis.  Susan Cecil, MD [place] 10:10 AM

## 2015-11-14 NOTE — Evaluation (Signed)
Occupational Therapy Evaluation Patient Details Name: Susan Lopez MRN: [id number] DOB: February 20, 1961 Today's Date: 11/14/2015    History of Present Illness Pt is a 55 y.o. female s/p Open treatment of left ankle fracture with internal fixation and Open treatment of left ankle syndesmosis with internal fixation. PMH: HTN, arithritis, Dysrhythmia, depression, and anxiety.     Clinical Impression   Pt reports that she has been requiring min assist with LB ADLs since accident; prior to that she was independent with ADLs and functional mobility. Currently pt is overall min guard for safety with ADLs and functional mobility with the exception of min assist for LB ADLs. Pt able to maintain NWB on LLE throughout all activities during session. All education complete; pt with no further questions or concerns for OT at this time. Pt planning to d/c home with intermittent supervision from family; daughter reports she works during the day. Pt ready to d/c from OT standpoint; signing off at this time. Thank you for this referral.     Follow Up Recommendations  No OT follow up;Supervision - Intermittent    Equipment Recommendations  None recommended by OT    Recommendations for Other Services PT consult     Precautions / Restrictions Precautions Precautions: Fall Restrictions Weight Bearing Restrictions: Yes LLE Weight Bearing: Non weight bearing      Mobility Bed Mobility Overal bed mobility: Needs Assistance Bed Mobility: Supine to Sit     Supine to sit: Min assist     General bed mobility comments: Min hand held assist to elevate trunk to sitting position.  Transfers Overall transfer level: Needs assistance Equipment used: Rolling walker (2 wheeled) Transfers: Sit to/from Stand Sit to Stand: Min guard         General transfer comment: Min guard for safety; no physical assist required. VCs for hand placement and NWB on LLE. Pt able to maintain NWB throughout functional  mobility.    Balance Overall balance assessment: Needs assistance         Standing balance support: Bilateral upper extremity supported Standing balance-Leahy Scale: Fair Standing balance comment: RW for support                            ADL Overall ADL's : Needs assistance/impaired Eating/Feeding: Set up;Sitting   Grooming: Min guard;Standing       Lower Body Bathing: Minimal assistance;Sit to/from stand       Lower Body Dressing: Minimal assistance;Sit to/from stand Lower Body Dressing Details (indicate cue type and reason): Educated on compensatory strategies for LB ADLs. Pt and daughter report daughter can assist as needed. Toilet Transfer: Min guard;Ambulation;Regular Toilet;RW Toilet Transfer Details (indicate cue type and reason): Pt able to maintain NWB on LLE throughout. Toileting- Water quality scientist and Hygiene: Min guard;Sit to/from stand       Functional mobility during ADLs: Min guard;Rolling walker General ADL Comments: Family present for OT eval. Daughter able to interpret for pt/therapist. Educated on home safety, edema management techniques, safety with RW; pt verbalized understanding.      Vision     Perception     Praxis      Pertinent Vitals/Pain Pain Assessment: No/denies pain     Hand Dominance     Extremity/Trunk Assessment Upper Extremity Assessment Upper Extremity Assessment: Overall WFL for tasks assessed   Lower Extremity Assessment Lower Extremity Assessment: Defer to PT evaluation   Cervical / Trunk Assessment Cervical / Trunk Assessment: Normal   Communication  Communication Communication: Prefers language other than English ([place]. Daughter in room; interpreted.)   Cognition Arousal/Alertness: Awake/alert Behavior During Therapy: WFL for tasks assessed/performed Overall Cognitive Status: Within Functional Limits for tasks assessed                     General Comments       Exercises        Shoulder Instructions      Home Living Family/patient expects to be discharged to:: Private residence Living Arrangements: Other relatives Available Help at Discharge: Family;Available PRN/intermittently (going to stay with daughter who works during the day) Type of Home: House Home Access: Stairs to enter     Home Layout: One level     Bathroom Shower/Tub: Occupational psychologist: Standard Bathroom Accessibility: Yes How Accessible: Accessible via walker Home Equipment: None          Prior Functioning/Environment Level of Independence: Needs assistance    ADL's / Homemaking Assistance Needed: Pt has needed assist with LB ADLs since accident; PTA pt was independent with ADLs.        OT Diagnosis: Acute pain   OT Problem List:     OT Treatment/Interventions:      OT Goals(Current goals can be found in the care plan section) Acute Rehab OT Goals OT Goal Formulation: All assessment and education complete, DC therapy  OT Frequency:     Barriers to D/C:            Co-evaluation              End of Session Equipment Utilized During Treatment: Gait belt;Rolling walker Nurse Communication: Mobility status  Activity Tolerance: Patient tolerated treatment well Patient left: in bed;with call bell/phone within reach;with family/visitor present   Time: 1046-1105 OT Time Calculation (min): 19 min Charges:  OT General Charges $OT Visit: 1 Procedure OT Evaluation $OT Eval Moderate Complexity: 1 Procedure G-Codes: OT G-codes **NOT FOR INPATIENT CLASS** Functional Assessment Tool Used: Clinical judgement Functional Limitation: Self care Self Care Current Status [id number]): At least 1 percent but less than 20 percent impaired, limited or restricted Self Care Goal Status [id number]): At least 1 percent but less than 20 percent impaired, limited or restricted Self Care Discharge Status [phone]): At least 1 percent but less than 20 percent impaired, limited or  restricted   Binnie Kand M.S., OTR/L Pager: [phone]  11/14/2015, 11:21 AM

## 2015-11-14 NOTE — Care Management Note (Signed)
Case Management Note  Patient Details  Name: Susan Lopez MRN: [id number] Date of Birth: 1961/03/18  Subjective/Objective:                  left bimalleolar ankle fracture with orif.   Action/Plan: CM spoke to the patient at the bedside using interpreter # [phone] [facility] and then later using Susan Lopez [phone]. CM spoke to patient and family and advised that [facility] PT not covered per Susan Lopez with [facility] and offered Outpatient PT. Patient agreeable to outpatient PT and said she can either take a taxi or daughter can take her as long as she can schedule her own appointment times. Daughter at the bedside and said that she would be able to take her. Patient states that family able to provide needed supervision. Patient is followed at [facility]. She also gets her medications filled there and has the [place] assistance program. CM provided good rx coupon for robaxin, percocet, oxycodone, zofran and discussed medications with patient and family including not taking the pain medications together or with the muscle relaxer due to over sedation. CM explained taking the ASA as a blood thinner to prevent blood clots. CM faxed RX to [facility] for pickup and confirmation received. CM faxed Outpatient PT order to [phone] and confirmation received. Patient chose [facility] for DME and 3N1 delivered to the bedside. Patient said that she has a walker at home along with wheelchair and crutches. Patient said that she is able to get all of her routine medications at the [facility] and declined any further needs and family said they would be able to afford picking up her pain medications. CM remains available should additional needs arise. Contact for son in law Susan Lopez is [phone].   Expected Discharge Date: 11/14/15 Expected Discharge Plan:  Home/Self Care  In-House Referral:     Discharge planning Services  CM Consult, Medication Assistance  Post Acute Care Choice:  Durable  Medical Equipment Choice offered to:  Patient, Adult Children  DME Arranged:  3-N-1 DME Agency:  [place]:    [place] Agency:     Status of Service:  Completed, signed off  Medicare Important Message Given:    Date Medicare IM Given:    Medicare IM give by:    Date Additional Medicare IM Given:    Additional Medicare Important Message give by:     If discussed at [place] of Stay Meetings, dates discussed:    Additional Comments:  Susan Sander, RN 11/14/2015, 6:21 PM

## 2015-11-14 NOTE — Progress Notes (Signed)
Subjective: Patient stable moving toes well and controlled   Objective: Vital signs in last 24 hours: Temp:  [98 F (36.7 C)-98.5 F (36.9 C)] 98.2 F (36.8 C) (01/14 0615) Pulse Rate:  [67-138] 89 (01/14 0615) Resp:  [11-20] 14 (01/14 0615) BP: (92-118)/(47-80) 95/47 mmHg (01/14 0615) SpO2:  [93 %-99 %] 99 % (01/14 0615)  Intake/Output from previous day: 01/13 0701 - 01/14 0700 In: 1986.3 [P.O.:480; I.V.:1356.3; IV Piggyback:150] Out: 600 [Urine:575; Blood:25] Intake/Output this shift:    Exam:  Dorsiflexion/Plantar flexion intact  Labs:  Recent Labs  11/13/15 0640  HGB 14.4    Recent Labs  11/13/15 0640  WBC 7.2  RBC 4.63  HCT 42.8  PLT 230    Recent Labs  11/13/15 0640  NA 140  K 3.7  CL 108  CO2 24  BUN 8  CREATININE 0.72  GLUCOSE 128*  CALCIUM 9.6   No results for input(s): LABPT, INR in the last 72 hours.  Assessment/Plan: Plan discharge today keep leg elevated follow-up with Dr. Erlinda Hong next week   [facility] 11/14/2015, 10:18 AM

## 2015-11-14 NOTE — Evaluation (Signed)
Physical Therapy Evaluation Patient Details Name: Susan Lopez MRN: [id number] DOB: 08-04-1961 Today's Date: 11/14/2015   History of Present Illness  Pt is a 55 y.o. female s/p Open treatment of left ankle fracture with internal fixation and Open treatment of left ankle syndesmosis with internal fixation. PMH: HTN, arithritis, Dysrhythmia, depression, and anxiety.    Clinical Impression  Pt did fairly well on steps today and has increased her control of standing with cues to family to maintain contact with transfer belt.  Son in Sports coach and daughter were availiable to instruct in steps but the family has just assisted with 2 HHA.  Talked with them about the benefit of using techniques if one person must assist her.    Follow Up Recommendations Home health PT;Supervision/Assistance - 24 hour    Equipment Recommendations  Rolling walker with 5" wheels    Recommendations for Other Services       Precautions / Restrictions Precautions Precautions: Fall Restrictions Weight Bearing Restrictions: Yes LLE Weight Bearing: Non weight bearing      Mobility  Bed Mobility Overal bed mobility: Needs Assistance Bed Mobility: Supine to Sit     Supine to sit: Min guard;Min assist     General bed mobility comments: very minor support under trunk and to minimally help to scoot to EOB  Transfers Overall transfer level: Needs assistance Equipment used: Rolling walker (2 wheeled) Transfers: Sit to/from [facility] Sit to Stand: Min guard;Min assist Stand pivot transfers: Min guard;Min assist       General transfer comment: assistance mainly to maintain her NWB on LLE but pt is trying to do alone, family reports she feels unsteady now at times since injury  Ambulation/Gait Ambulation/Gait assistance: Min assist;Min guard Ambulation Distance (Feet): 10 Feet Assistive device: Rolling walker (2 wheeled);1 person hand held assist   Gait velocity: reduced Gait  velocity interpretation: Below normal speed for age/gender General Gait Details: hopping steps to go from bed to bed, limited distance since pt needs to try steps  Stairs            Wheelchair Mobility    Modified Rankin (Stroke Patients Only)       Balance Overall balance assessment: Needs assistance Sitting-balance support: Single extremity supported Sitting balance-Leahy Scale: Fair     Standing balance support: Bilateral upper extremity supported Standing balance-Leahy Scale: Poor                               Pertinent Vitals/Pain Pain Assessment: No/denies pain    Home Living Family/patient expects to be discharged to:: Private residence Living Arrangements: Other relatives Available Help at Discharge: Family;Available PRN/intermittently (staying with daughter over the weekend then to her own home) Type of Home: House Home Access: Stairs to enter Entrance Stairs-Rails: Left Entrance Stairs-Number of Steps: 4 Home Layout: One level Home Equipment: Walker - 2 wheels;Crutches;Bedside commode;Shower seat;Other (comment) (had equipment from old ankle sprain)      Prior Function Level of Independence: Independent      ADL's / Homemaking Assistance Needed: independent prior to accident        Hand Dominance        Extremity/Trunk Assessment   Upper Extremity Assessment: Overall WFL for tasks assessed           Lower Extremity Assessment: LLE deficits/detail   LLE Deficits / Details: has splint and ace wrap to cover ORIF to L ankle  Cervical / Trunk Assessment:  Normal  Communication   Communication: Prefers language other than English (Spanish language)  Cognition Arousal/Alertness: Awake/alert Behavior During Therapy: WFL for tasks assessed/performed Overall Cognitive Status: Within Functional Limits for tasks assessed                      General Comments General comments (skin integrity, edema, etc.): Pt is fairly good  with walker and has all the equipment at home due to previous issues with L ankle sprain and now is NWB.  Her plan is to go home with family but her husband will be her main caregiver when daughter gone to work    Exercises        Assessment/Plan    PT Assessment Patient needs continued PT services  PT Diagnosis Difficulty walking   PT Problem List Decreased strength;Decreased range of motion;Decreased activity tolerance;Decreased balance;Decreased mobility;Decreased coordination;Decreased knowledge of use of DME;Decreased safety awareness;Decreased skin integrity;Pain  PT Treatment Interventions DME instruction;Gait training;Functional mobility training;Stair training;Therapeutic activities;Therapeutic exercise;Balance training;Neuromuscular re-education;Patient/family education   PT Goals (Current goals can be found in the Care Plan section) Acute Rehab PT Goals Patient Stated Goal: none translated PT Goal Formulation: With family (Pt was translated and did not have goals) Time For Goal Achievement: 11/28/15 Potential to Achieve Goals: Good    Frequency Min 2X/week   Barriers to discharge Inaccessible home environment;Decreased caregiver support going to stay with daughter a short time    Co-evaluation               End of Session Equipment Utilized During Treatment: Gait belt Activity Tolerance: Patient tolerated treatment well;Patient limited by pain Patient left: in chair;with call bell/phone within reach;with family/visitor present Nurse Communication: Mobility status;Other (comment) (needs for home)         Time: [id number] PT Time Calculation (min) (ACUTE ONLY): 38 min   Charges:   PT Evaluation $PT Eval Moderate Complexity: 1 Procedure PT Treatments $Gait Training: 8-22 mins $Therapeutic Activity: 8-22 mins   PT G Codes:        Ramond Dial 12/10/2015, 4:20 PM   Mee Hives, PT MS Acute Rehab Dept. Number: [phone] [phone] and [place] [phone]

## 2015-11-16 ENCOUNTER — Encounter (HOSPITAL_COMMUNITY): Payer: Self-pay | Admitting: Orthopaedic Surgery

## 2015-11-20 ENCOUNTER — Other Ambulatory Visit: Payer: Self-pay | Admitting: Obstetrics & Gynecology

## 2015-11-25 NOTE — Discharge Summary (Signed)
Physician Discharge Summary      Patient ID: Susan Lopez MRN: [id number] DOB/AGE: [id number] y.o.  Admit date: 11/13/2015 Discharge date: 11/25/2015  Admission Diagnoses:  <principal problem not specified>  Discharge Diagnoses:  Active Problems:   Bimalleolar fracture of left ankle   S/P ORIF (open reduction internal fixation) fracture   Past Medical History  Diagnosis Date  . Iron deficiency anemia   . Hypertension   . Arthritis   . Gallstones   . Hemorrhoids, external 10-21-11    occ. bothersome  . Calculus of gallbladder with acute and chronic cholecystitis without obstruction, s/p lap chole 27Dec2012 09/19/2011  . Bone lesion 10/04/2013  . Mammogram abnormal 10/04/2013  . Complication of anesthesia   . Dysrhythmia     "fast time"  . Depression   . Anxiety     Surgeries: Procedure(s): OPEN REDUCTION INTERNAL FIXATION (ORIF) LEFT BIMALLEOLAR ANKLE FRACTURE on 11/13/2015   Consultants (if any):    Discharged Condition: Improved  Hospital Course: Susan Lopez is an 55 y.o. female who was admitted 11/13/2015 with a diagnosis of <principal problem not specified> and went to the operating room on 11/13/2015 and underwent the above named procedures.    She was given perioperative antibiotics:  Anti-infectives    Start     Dose/Rate Route Frequency Ordered Stop   11/13/15 1430  ceFAZolin (ANCEF) IVPB 2 g/50 mL premix     2 g 100 mL/hr over 30 Minutes Intravenous Every 6 hours 11/13/15 1421 11/14/15 0309   11/13/15 0800  ceFAZolin (ANCEF) IVPB 2 g/50 mL premix     2 g 100 mL/hr over 30 Minutes Intravenous To [facility] 11/12/15 0948 11/13/15 0857    .  She was given sequential compression devices, early ambulation, and aspirin for DVT prophylaxis.  She benefited maximally from the hospital stay and there were no complications.    Recent vital signs:  Filed Vitals:   11/14/15 0110 11/14/15 0615  BP: 92/50 95/47  Pulse: 73 89  Temp:  98.5 F (36.9 C) 98.2 F (36.8 C)  Resp: 13 14    Recent laboratory studies:  Lab Results  Component Value Date   HGB 14.4 11/13/2015   HGB 14.8 10/08/2015   HGB 15.0 03/09/2015   Lab Results  Component Value Date   WBC 7.2 11/13/2015   PLT 230 11/13/2015   Lab Results  Component Value Date   INR 1.01 02/12/2010   Lab Results  Component Value Date   NA 140 11/13/2015   K 3.7 11/13/2015   CL 108 11/13/2015   CO2 24 11/13/2015   BUN 8 11/13/2015   CREATININE 0.72 11/13/2015   GLUCOSE 128* 11/13/2015    Discharge Medications:     Medication List    STOP taking these medications        traMADol 50 MG tablet  Commonly known as:  ULTRAM      TAKE these medications        aspirin EC 325 MG tablet  Take 1 tablet (325 mg total) by mouth 2 (two) times daily.     atorvastatin 20 MG tablet  Commonly known as:  LIPITOR  Take 1 tablet (20 mg total) by mouth daily.     cyclobenzaprine 10 MG tablet  Commonly known as:  FLEXERIL  Take 1 tablet (10 mg total) by mouth 3 (three) times daily as needed for muscle spasms.     docusate sodium 100 MG capsule  Commonly known as:  COLACE  Take 1 capsule (100 mg total) by mouth 2 (two) times daily as needed for mild constipation.     gabapentin 100 MG capsule  Commonly known as:  NEURONTIN  Take 1 capsule (100 mg total) by mouth 2 (two) times daily.     lisinopril 10 MG tablet  Commonly known as:  PRINIVIL,ZESTRIL  Take 1 tablet by mouth daily.     meloxicam 15 MG tablet  Commonly known as:  MOBIC  Take 1 tablet (15 mg total) by mouth daily.     methocarbamol 750 MG tablet  Commonly known as:  ROBAXIN  Take 1 tablet (750 mg total) by mouth 2 (two) times daily as needed for muscle spasms.     multivitamin with minerals Tabs tablet  Take 1 tablet by mouth daily.     omeprazole 40 MG capsule  Commonly known as:  PRILOSEC  Take 1 capsule (40 mg total) by mouth daily.     ondansetron 4 MG tablet  Commonly known as:   ZOFRAN  Take 1-2 tablets (4-8 mg total) by mouth every 8 (eight) hours as needed for nausea or vomiting.     oxyCODONE 5 MG immediate release tablet  Commonly known as:  Oxy IR/ROXICODONE  Take 1-3 tablets (5-15 mg total) by mouth every 4 (four) hours as needed.     oxyCODONE-acetaminophen 5-325 MG tablet  Commonly known as:  PERCOCET  Take 1-2 tablets by mouth every 4 (four) hours as needed for severe pain.     REFRESH OP  Place 1 drop into both eyes 2 (two) times daily as needed.     senna-docusate 8.6-50 MG tablet  Commonly known as:  SENOKOT S  Take 1 tablet by mouth at bedtime as needed.        Diagnostic Studies: Dg Ankle 2 Views Left  11/13/2015  CLINICAL DATA:  Left ankle ORIF. EXAM: DG C-ARM 61-120 MIN; LEFT ANKLE - 2 VIEW COMPARISON:  11/09/2015 FINDINGS: Distal fibula and medial malleolus fracture reduction and fixation with syndesmotic screw. Normal ankle alignment. IMPRESSION: Ankle fracture and distal syndesmotic repair with normal alignment. Electronically Signed   By: Monte Fantasia M.D.   On: 11/13/2015 10:50   Dg Ankle Complete Left  11/09/2015  CLINICAL DATA:  Left ankle pain, fell on ice EXAM: LEFT ANKLE COMPLETE - 3+ VIEW COMPARISON:  None. FINDINGS: Three views of the left ankle submitted. There is oblique mild displaced fracture in distal left fibula. Mild displaced fracture of distal tibia medial malleolus. There is mild disruption of ankle mortise with mild medial displacement of distal tibia. Soft tissue swelling is noted around the ankle. There is plantar and posterior spurring of calcaneus. IMPRESSION: Oblique mild displaced fracture in distal fibula. Mild displaced comminuted fracture in distal tibia medial malleolus. Mild disruption of ankle mortise with mild medial displacement of distal tibia and mild widening of medial tibiotalar joint space. Electronically Signed   By: Lahoma Crocker M.D.   On: 11/09/2015 15:59   Dg Knee Complete 4 Views Left  11/09/2015   CLINICAL DATA:  Slip and fall on ice. Left ankle pain and swelling extending to the knee. EXAM: LEFT KNEE - COMPLETE 4+ VIEW COMPARISON:  None. FINDINGS: There is no evidence of fracture, dislocation, or joint effusion. There is no evidence of arthropathy or other focal bone abnormality. Soft tissues are unremarkable. IMPRESSION: Negative two views of the left knee. Electronically Signed   By: San Morelle M.D.   On: 11/09/2015 16:00  Dg C-arm 1-60 Min  11/13/2015  CLINICAL DATA:  Left ankle ORIF. EXAM: DG C-ARM 61-120 MIN; LEFT ANKLE - 2 VIEW COMPARISON:  11/09/2015 FINDINGS: Distal fibula and medial malleolus fracture reduction and fixation with syndesmotic screw. Normal ankle alignment. IMPRESSION: Ankle fracture and distal syndesmotic repair with normal alignment. Electronically Signed   By: Monte Fantasia M.D.   On: 11/13/2015 10:50    Disposition: 01-Home or Self Care      Discharge Instructions    Call MD / Call 911    Complete by:  As directed   If you experience chest pain or shortness of breath, CALL 911 and be transported to the hospital emergency room.  If you develope a fever above 101 F, pus (white drainage) or increased drainage or redness at the wound, or calf pain, call your surgeon's office.     Constipation Prevention    Complete by:  As directed   Drink plenty of fluids.  Prune juice may be helpful.  You may use a stool softener, such as Colace (over the counter) 100 mg twice a day.  Use MiraLax (over the counter) for constipation as needed.     Diet - low sodium heart healthy    Complete by:  As directed      Increase activity slowly as tolerated    Complete by:  As directed            Follow-up Information    Follow up with Marianna Payment, MD In 2 weeks.   Specialty:  Orthopedic Surgery   Why:  For suture removal, For wound re-check   Contact information:   [place] [address] [phone]       Call Physical therapy.    Why:  Schedule appointment   Contact information:   [address] [place], [place] [address]  [phone]       Signed: Marianna Payment 11/25/2015, 1:07 PM

## 2015-11-27 ENCOUNTER — Ambulatory Visit: Payer: Self-pay

## 2015-11-30 ENCOUNTER — Ambulatory Visit: Payer: Self-pay | Attending: Internal Medicine

## 2015-11-30 MED FILL — ATORVASTATIN 20 MG TABLET: 20 | 30 days supply | Qty: 30 | Fill #3

## 2015-12-01 ENCOUNTER — Ambulatory Visit: Payer: Self-pay | Admitting: Physical Therapy

## 2015-12-03 ENCOUNTER — Encounter: Payer: Self-pay | Admitting: Obstetrics and Gynecology

## 2015-12-03 ENCOUNTER — Other Ambulatory Visit (HOSPITAL_COMMUNITY)
Admission: RE | Admit: 2015-12-03 | Discharge: 2015-12-03 | Disposition: A | Discharge status: Home / Self Care | Payer: Self-pay | Source: Ambulatory Visit | Attending: Obstetrics and Gynecology | Admitting: Obstetrics and Gynecology

## 2015-12-03 ENCOUNTER — Ambulatory Visit (INDEPENDENT_AMBULATORY_CARE_PROVIDER_SITE_OTHER): Payer: Self-pay | Admitting: Obstetrics and Gynecology

## 2015-12-03 VITALS — BP 99/62 | HR 85 | Temp 98.2°F | Wt 170.0 lb

## 2015-12-03 DIAGNOSIS — N950 Postmenopausal bleeding: Secondary | ICD-10-CM | POA: Insufficient documentation

## 2015-12-03 NOTE — Progress Notes (Signed)
CLINIC ENCOUNTER NOTE  History:  55 y.o. [id number] hx htn, anemia, recent ankle fracture s/p surgery, here for bleeding. Menopausal 12 months, then had bleeding for 20 days last month. No bleeding currently. H 14.4 when checked on 12/8. 03/2014 pap wnl. U/s last month showed possible submucosal fibroids. For past 4 years has had irregular periods, at time heavy.    Past Medical History  Diagnosis Date  . Iron deficiency anemia   . Hypertension   . Arthritis   . Gallstones   . Hemorrhoids, external 10-21-11    occ. bothersome  . Calculus of gallbladder with acute and chronic cholecystitis without obstruction, s/p lap chole 27Dec2012 09/19/2011  . Bone lesion 10/04/2013  . Mammogram abnormal 10/04/2013  . Complication of anesthesia   . Dysrhythmia     "fast time"  . Depression   . Anxiety     Past Surgical History  Procedure Laterality Date  . Cesarean section  1985, 1992  . Cholecystectomy  10/27/2011    Procedure: LAPAROSCOPIC CHOLECYSTECTOMY WITH INTRAOPERATIVE CHOLANGIOGRAM;  Surgeon: Adin Hector, MD;  Location: [facility];  Service: General;  Laterality: N/A;  Laparoscopic Chole w/ IOC Single Site  . Eye surgery  1`12-21-10    bil. for tissue growth-laser surgery  . Orif ankle fracture Left 11/13/2015    Procedure: OPEN REDUCTION INTERNAL FIXATION (ORIF) LEFT BIMALLEOLAR ANKLE FRACTURE;  Surgeon: Leandrew Koyanagi, MD;  Location: [facility];  Service: Orthopedics;  Laterality: Left;    The following portions of the patient's history were reviewed and updated as appropriate: allergies, current medications, past family history, past medical history, past social history, past surgical history and problem list.    Review of Systems:  See above; comprehensive review of systems was otherwise negative.  Objective:  Physical Exam BP 99/62 mmHg  Pulse 85  Temp(Src) 98.2 F (36.8 C)  Wt 170 lb (77.111 kg)  LMP 10/08/2015 CONSTITUTIONAL: Well-developed, well-nourished female in no acute  distress.  HENT:  Normocephalic, atraumatic SKIN: Skin is warm and dry.  [place]: Alert  PSYCHIATRIC: Normal mood and affect.  CARDIOVASCULAR: Normal heart rate noted RESPIRATORY: Effort and breath sounds normal, no problems with respiration noted ABDOMEN: Soft, no distention noted.  No tenderness, rebound or guarding.  SSE: normal vulva, vagina, and cervix. No bleeding.   Labs and Imaging Dg Ankle 2 Views Left  11/13/2015  CLINICAL DATA:  Left ankle ORIF. EXAM: DG C-ARM 61-120 MIN; LEFT ANKLE - 2 VIEW COMPARISON:  11/09/2015 FINDINGS: Distal fibula and medial malleolus fracture reduction and fixation with syndesmotic screw. Normal ankle alignment. IMPRESSION: Ankle fracture and distal syndesmotic repair with normal alignment. Electronically Signed   By: Monte Fantasia M.D.   On: 11/13/2015 10:50   Dg Ankle Complete Left  11/09/2015  CLINICAL DATA:  Left ankle pain, fell on ice EXAM: LEFT ANKLE COMPLETE - 3+ VIEW COMPARISON:  None. FINDINGS: Three views of the left ankle submitted. There is oblique mild displaced fracture in distal left fibula. Mild displaced fracture of distal tibia medial malleolus. There is mild disruption of ankle mortise with mild medial displacement of distal tibia. Soft tissue swelling is noted around the ankle. There is plantar and posterior spurring of calcaneus. IMPRESSION: Oblique mild displaced fracture in distal fibula. Mild displaced comminuted fracture in distal tibia medial malleolus. Mild disruption of ankle mortise with mild medial displacement of distal tibia and mild widening of medial tibiotalar joint space. Electronically Signed   By: Lahoma Crocker M.D.   On: 11/09/2015  15:59   Dg Knee Complete 4 Views Left  11/09/2015  CLINICAL DATA:  Slip and fall on ice. Left ankle pain and swelling extending to the knee. EXAM: LEFT KNEE - COMPLETE 4+ VIEW COMPARISON:  None. FINDINGS: There is no evidence of fracture, dislocation, or joint effusion. There is no evidence of  arthropathy or other focal bone abnormality. Soft tissues are unremarkable. IMPRESSION: Negative two views of the left knee. Electronically Signed   By: San Morelle M.D.   On: 11/09/2015 16:00   Dg C-arm 1-60 Min  11/13/2015  CLINICAL DATA:  Left ankle ORIF. EXAM: DG C-ARM 61-120 MIN; LEFT ANKLE - 2 VIEW COMPARISON:  11/09/2015 FINDINGS: Distal fibula and medial malleolus fracture reduction and fixation with syndesmotic screw. Normal ankle alignment. IMPRESSION: Ankle fracture and distal syndesmotic repair with normal alignment. Electronically Signed   By: Monte Fantasia M.D.   On: 11/13/2015 10:50   Procedure: EMB Written informed consent Speculum Betadine x2 Tenaculum to anterior lip Pipelle passed to 8 cm, emb performed x3 Minimal bleeding Pt tolerated procedure well, no complications  Assessment & Plan:   # Postmenopausal bleeding: u/s shows possible submucosal fibroids, the likely cause. Could represent perimenopause, though if indeed 12 months since previous bleed would represent postmenopausal. Normal tsh and a1c one year ago. H wnl last month and no current bleeding so low concern for significant anemia. Pap wnl 03/2014. - EMB performed today, will f/u results and manage accordingly  Routine preventative health maintenance measures emphasized.     Diane Hanel B. Laniya Friedl, [place] for [employer], [place]

## 2015-12-07 ENCOUNTER — Telehealth: Payer: Self-pay

## 2015-12-07 NOTE — Telephone Encounter (Signed)
Pt has been informed of surgical path report. She will follow up as needed for heavy bleeding or if it comes bothersome.

## 2015-12-25 ENCOUNTER — Ambulatory Visit
Admission: RE | Admit: 2015-12-25 | Discharge: 2015-12-25 | Disposition: A | Discharge status: Home / Self Care | Payer: No Typology Code available for payment source | Source: Ambulatory Visit

## 2015-12-25 DIAGNOSIS — Z1231 Encounter for screening mammogram for malignant neoplasm of breast: Secondary | ICD-10-CM

## 2015-12-31 ENCOUNTER — Telehealth: Payer: Self-pay | Admitting: Internal Medicine

## 2015-12-31 NOTE — Telephone Encounter (Signed)
Lvm regarding her physical Therapy we refer patients to [facility] . Pt need to contact [facility] to see if they can refer her to another place .

## 2015-12-31 NOTE — Telephone Encounter (Signed)
Pt. Called stating that her Orthopedic referred her to a Therapist. When she went to the Therapist they would not accept her cone discount. Pt. Needs a referral to go to the Therapist. Please f/u

## 2016-01-04 ENCOUNTER — Ambulatory Visit: Payer: Self-pay | Admitting: Physical Therapy

## 2016-01-06 ENCOUNTER — Ambulatory Visit: Payer: No Typology Code available for payment source | Attending: Orthopaedic Surgery

## 2016-01-06 DIAGNOSIS — R531 Weakness: Secondary | ICD-10-CM | POA: Insufficient documentation

## 2016-01-06 DIAGNOSIS — H8110 Benign paroxysmal vertigo, unspecified ear: Secondary | ICD-10-CM

## 2016-01-06 DIAGNOSIS — Z967 Presence of other bone and tendon implants: Secondary | ICD-10-CM | POA: Insufficient documentation

## 2016-01-06 DIAGNOSIS — R600 Localized edema: Secondary | ICD-10-CM | POA: Insufficient documentation

## 2016-01-06 DIAGNOSIS — R262 Difficulty in walking, not elsewhere classified: Secondary | ICD-10-CM

## 2016-01-06 DIAGNOSIS — M25572 Pain in left ankle and joints of left foot: Secondary | ICD-10-CM | POA: Insufficient documentation

## 2016-01-06 DIAGNOSIS — Z9889 Other specified postprocedural states: Secondary | ICD-10-CM

## 2016-01-06 DIAGNOSIS — Z8781 Personal history of (healed) traumatic fracture: Secondary | ICD-10-CM

## 2016-01-06 NOTE — Patient Instructions (Signed)
ROM: Inversion / Eversion   With left leg relaxed, gently turn ankle and foot in and out. Move through full range of motion. Avoid pain. Repeat ____ times per set. Do ____ sets per session. Do ____ sessions per day.  http://orth.exer.us/36   Copyright  VHI. All rights reserved.  ROM: Plantar / Dorsiflexion   With left leg relaxed, gently flex and extend ankle. Move through full range of motion. Avoid pain. Repeat ____ times per set. Do ____ sets per session. Do ____ sessions per day.  http://orth.exer.us/34   Copyright  VHI. All rights reserved.  Ankle Alphabet   Using left ankle and foot only, trace the letters of the alphabet. Perform A to Z. Repeat ____ times per set. Do ____ sets per session. Do ____ sessions per day.  http://orth.exer.us/16   Copyright  VHI. All rights reserved.  Ankle Circles   Slowly rotate right foot and ankle clockwise then counterclockwise. Gradually increase range of motion. Avoid pain. Circle ____ times each direction per set. Do ____ sets per session. Do ____ sessions per day.  http://orth.exer.us/30   Copyright  VHI. All rights reserved.

## 2016-01-06 NOTE — Therapy (Signed)
[place], [place], [address] Phone: [phone]   Fax:  [phone]  Physical Therapy Evaluation  Patient Details  Name: Hanin Deida MRN: [id number] Date of Birth: 01/11/1961 Referring Provider: Frankey Shown  Encounter Date: 01/06/2016      PT End of Session - 01/06/16 1227    Visit Number 1   Number of Visits 16   Date for PT Re-Evaluation 03/02/16   PT Start Time 0845   PT Stop Time 0945   PT Time Calculation (min) 60 min   Activity Tolerance Patient tolerated treatment well   Behavior During Therapy [facility] for tasks assessed/performed      Past Medical History  Diagnosis Date  . Iron deficiency anemia   . Hypertension   . Arthritis   . Gallstones   . Hemorrhoids, external 10-21-11    occ. bothersome  . Calculus of gallbladder with acute and chronic cholecystitis without obstruction, s/p lap chole 27Dec2012 09/19/2011  . Bone lesion 10/04/2013  . Mammogram abnormal 10/04/2013  . Complication of anesthesia   . Dysrhythmia     "fast time"  . Depression   . Anxiety     Past Surgical History  Procedure Laterality Date  . Cesarean section  1985, 1992  . Cholecystectomy  10/27/2011    Procedure: LAPAROSCOPIC CHOLECYSTECTOMY WITH INTRAOPERATIVE CHOLANGIOGRAM;  Surgeon: Adin Hector, MD;  Location: [facility];  Service: General;  Laterality: N/A;  Laparoscopic Chole w/ IOC Single Site  . Eye surgery  1`12-21-10    bil. for tissue growth-laser surgery  . Orif ankle fracture Left 11/13/2015    Procedure: OPEN REDUCTION INTERNAL FIXATION (ORIF) LEFT BIMALLEOLAR ANKLE FRACTURE;  Surgeon: Leandrew Koyanagi, MD;  Location: [place];  Service: Orthopedics;  Laterality: Left;    There were no vitals filed for this visit.  Visit Diagnosis:  Left ankle pain - Plan: PT plan of care cert/re-cert  Difficulty in walking - Plan: PT plan of care cert/re-cert  Status post open reduction with internal fixation  (ORIF) of fracture of ankle - Plan: PT plan of care cert/re-cert  Weakness - Plan: PT plan of care cert/re-cert  Localized edema - Plan: PT plan of care cert/re-cert  BPPV (benign paroxysmal positional vertigo), unspecified laterality - Plan: PT plan of care cert/re-cert      Subjective Assessment - 01/06/16 0856    Subjective Pt slipped on the ice 11/09/15 that resulted in L bimalleolar fracture. Pt underwent ORIF on 11/13/15 by Dr Frankey Shown. Patient is spanish speaking, accompanied by an interpereter. MD  writes "TDWB" on script and pt verbalizes understanding, but presents with crutches today and is challenged with them, although pt reports she typically uses walker and wheelchair at home, but wanted to try them today. PT instructed pt to continue to use RW and do not use crutches ( she was unsteady and not able to maintain TDWB well).  Sees MD on 01/25/16. Pt also reports that she gets dizzy everytime she lays down. This occurs on and off, but has been happening for the past month.    Patient is accompained by: Interpreter  Di Kindle   Pertinent History HTN, arthritis, Depression, Anxiety, dysrhythmia   Limitations Standing;Walking;House hold activities   How long can you sit comfortably? 15 mins before needing tovelevate   How long can you stand comfortably? 20 mins (washing dishes)    How long can you walk comfortably? 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    Currently in Pain? Yes   Pain Score 5    Pain Location Ankle   Pain Orientation Left   Pain Descriptors / Indicators Burning;Throbbing;Tightness  Hot   Pain Type Surgical pain   Pain Onset More than a month ago   Pain Frequency Constant   Aggravating Factors  dependent position.    Pain Relieving Factors elevation, medication,    Effect of Pain on Daily Activities unable to walk, stadn, ADLs, work,             [facility] PT Assessment - 01/06/16 0001    Assessment   Medical Diagnosis L  ankle ORIF   and R BPPV   Referring Provider Naiping Xu   Onset Date/Surgical Date 11/13/15   Hand Dominance Right   Next MD Visit 01/25/16   Prior Therapy none   Precautions   Precautions Other (comment)   Precaution Comments TDWB   Restrictions   Weight Bearing Restrictions Yes   LLE Weight Bearing Touchdown weight bearing   Balance Screen   Has the patient fallen in the past 6 months Yes   How many times? [place] residence   Prior Function   Level of Independence Independent   Cognition   Overall Cognitive Status Within Functional Limits for tasks assessed   Observation/Other Assessments-Edema    Edema Circumferential;Figure 8   Circumferential Edema   Circumferential - Right 26 cm  malleoli   Circumferential - Left  23 cm  malleoli   Figure 8 Edema   Figure 8 - Right  52 cm   Figure 8 - Left  53 cm   ROM / Strength   AROM / PROM / Strength AROM  Deferred strength testing at eval due to pain    AROM   AROM Assessment Site Ankle   Right/Left Ankle Left   Left Ankle Dorsiflexion -12  lacking from neutral   Left Ankle Plantar Flexion 40   Left Ankle Inversion 40   Left Ankle Eversion -8  lacking from neutral   Palpation   Palpation comment swelling and edema throughout ankle and lateral malleolus.  Palpable tenderness throughout region.    Special Tests    Special Tests --  Dix-Hallpike +: R>L indicating R BPPV                   [facility] Adult PT Treatment/Exercise - 01/06/16 0001    Exercises   Exercises Ankle   Modalities   Modalities Cryotherapy   Cryotherapy   Number Minutes Cryotherapy 5 Minutes  elevated   Cryotherapy Location Ankle   Type of Cryotherapy Ice pack   Ankle Exercises: Seated   ABC's 1 rep  HEP   Ankle Circles/Pumps 10 reps  HEP   Other Seated Ankle Exercises PF/DF, INV/ EV 10 x each   HEP                PT Education - 01/06/16 1226    Education provided Yes   Education  Details PT POC, TTWB (TDWB), Use RW, Ice and elevate, HEP : Anke AROM, BPPV and Epley tx.    Person(s) Educated Patient   Methods Explanation;Demonstration;Handout   Comprehension Verbalized understanding;Need further instruction          PT Short Term Goals - 01/06/16 1247    PT SHORT TERM GOAL #1   Title Pt will be I with initial HEP for  continued strengthening and mobility by 02/06/16.   Time 4   Period Weeks   Status New   PT SHORT TERM GOAL #2   Title L ankle DF AROM will improve to 5 degrees pain-free in order to stand for 10 mins without pain.    Time 4   Period Weeks   Status New   PT SHORT TERM GOAL #3   Title Pt will demonstrate proper TTWB with RW for all gait 100 feet, indep by 02/06/16.     Time 4   Period Weeks   Status New   PT SHORT TERM GOAL #4   Title Pt will no longer complain of dizziness with sit to supine transfers by 02/05/16.   Time 4   Period Weeks   Status New           PT Long Term Goals - 01/06/16 1249    PT LONG TERM GOAL #1   Title L ankle PF strength will improve to 3+/5 in order to perform stairs pain free by  03/02/16 .    Time 8   Period Weeks   Status New   PT LONG TERM GOAL #2   Title L ankle DF ROM will improve to 15 degrees in order for  pt to walk for 30 mins pain- free with SPC by 03/02/16.   Time 8   Period Weeks   Status New   PT LONG TERM GOAL #3   Title Pt will tolerate SLS on L LE ( once FWB by MD)  for 30 secs to improve functional mobility tolerance and decrease risk for falls by 03/02/16.    Time 8   Period Weeks   Status New   PT LONG TERM GOAL #4   Title L ankle malleoli circumference will improve to 24cm by 03/02/16.   Time 8   Period Weeks   Status New               Plan - 01/06/16 1231    Clinical Impression Statement Pt presents for low complexity evaluation for L ankle pain following bimalleolar fx and ORIF on 11/13/15. Pt presents with impairments including pain, impaired mobility/ROM, and impaired strength,  which limit pt's functional abilities with walking, standing, stairs.  Pt will benefit from oupt PT for 2 times a week for 8 weeks in order to address these impairments and functional limitations and return pt to pain-free PLOF. In addition, pt presents with symptoms compatible with BPPV: Upon lying supine, pt c/o of room-spinning dizziness that last a few seconds and that has persisted for the past month. Pt reports she has this "on and off" throughout her life. Educated pt on BPPV and recommended Epley repositioning to correct it.  Tested Dix-Hallpike and symptoms were provoked on R more than L. Performed Epley maneuver for R posterior canal BPPV.  Will reassess symptoms next visit.   Pt will benefit from skilled therapeutic intervention in order to improve on the following deficits Abnormal gait;Decreased activity tolerance;Decreased balance;Decreased mobility;Decreased strength;Increased edema;Decreased knowledge of use of DME;Decreased endurance;Decreased safety awareness;Dizziness;Increased muscle spasms;Difficulty walking;Decreased range of motion;Pain;Impaired perceived functional ability;Impaired flexibility   Rehab Potential Good   PT Frequency 2x / week   PT Duration 8 weeks   PT Treatment/Interventions ADLs/Self Care Home Management;Canalith Repostioning;Cryotherapy;Iontophoresis 4mg /ml Dexamethasone;Moist Heat;Stair training;Gait training;DME Instruction;Neuromuscular re-education;Patient/family education;Functional mobility training;Therapeutic activities;Therapeutic exercise;Manual techniques;Balance training;Vestibular;Taping;Dry needling;Passive range of motion;Scar mobilization  No ESTIM: pt has Dysrhythmia   PT Next Visit Plan Reassess BPPV- any better?  Epley, if appropriate. Review HEP: ankle AROM. towel toe cruntches, MT and ice and elevation.    PT Home Exercise Plan Ankle AROM    Consulted and Agree with Plan of Care Patient         Problem List Patient Active Problem List    Diagnosis Date Noted  . Bimalleolar fracture of left ankle 11/13/2015  . S/P ORIF (open reduction internal fixation) fracture 11/13/2015  . Healthcare maintenance 10/08/2015  . Postmenopausal bleeding 10/08/2015  . Pain in joint, ankle and foot 10/08/2015  . Headache disorder 10/08/2015  . Atypical chest pain 10/08/2015  . Essential hypertension 10/08/2015  . Neck pain on right side 08/12/2015  . Grief reaction 03/09/2015  . Midline thoracic back pain 12/11/2014  . Depression (emotion) 08/12/2014  . Dyslipidemia 08/12/2014  . Headache(784.0) 07/17/2014  . UTI (urinary tract infection) 04/16/2014  . Pap smear for cervical cancer screening 04/16/2014  . Cystocele with rectocele 04/16/2014  . Hyperlipidemia LDL goal < 100 01/22/2014  . Breast lump on left side at 12 o'clock position 10/22/2013  . Chronic constipation 10/09/2013  . Bone lesion 10/04/2013  . Mammogram abnormal 10/04/2013  . Abdominal pain, unspecified site 10/04/2013  . Unspecified essential hypertension 06/10/2013  . Chest pain, unspecified 06/10/2013  . Chronic throat pain 06/10/2013  . Back pain 11/14/2011  . Obesity (BMI 30-39.9) 11/14/2011  . GERD (gastroesophageal reflux disease) 09/19/2011  . Calculus of gallbladder with acute and chronic cholecystitis without obstruction, s/p lap chole 27Dec2012 09/19/2011  . Hemorrhoids, internal, with bleeding 09/19/2011  . ANEMIA-IRON DEFICIENCY 03/19/2009  . CHEST PAIN, ATYPICAL 03/19/2009    Dollene Cleveland, PT 01/06/2016, 1:01 PM  [facility] [address] [place], [place], [address] Phone: [phone]   Fax:  [phone]  Name: Glorietta Perilli MRN: [id number] Date of Birth: Mar 11, 1961

## 2016-01-08 ENCOUNTER — Ambulatory Visit: Payer: No Typology Code available for payment source

## 2016-01-08 DIAGNOSIS — Z8781 Personal history of (healed) traumatic fracture: Secondary | ICD-10-CM

## 2016-01-08 DIAGNOSIS — Z9889 Other specified postprocedural states: Secondary | ICD-10-CM

## 2016-01-08 DIAGNOSIS — R531 Weakness: Secondary | ICD-10-CM

## 2016-01-08 DIAGNOSIS — R262 Difficulty in walking, not elsewhere classified: Secondary | ICD-10-CM

## 2016-01-08 DIAGNOSIS — M25572 Pain in left ankle and joints of left foot: Secondary | ICD-10-CM

## 2016-01-08 DIAGNOSIS — R600 Localized edema: Secondary | ICD-10-CM

## 2016-01-08 NOTE — Therapy (Signed)
[place], [place], [address] Phone: [phone]   Fax:  [phone]  Physical Therapy Treatment  Patient Details  Name: Susan Lopez MRN: [id number] Date of Birth: [birth year] Referring Provider: Frankey Shown  Encounter Date: 01/08/2016      PT End of Session - 01/08/16 0936    Visit Number 2   Number of Visits 16   Date for PT Re-Evaluation 03/02/16   PT Start Time 0850   PT Stop Time 0945   PT Time Calculation (min) 55 min   Activity Tolerance Patient tolerated treatment well   Behavior During Therapy [facility] for tasks assessed/performed      Past Medical History  Diagnosis Date  . Iron deficiency anemia   . Hypertension   . Arthritis   . Gallstones   . Hemorrhoids, external 10-21-11    occ. bothersome  . Calculus of gallbladder with acute and chronic cholecystitis without obstruction, s/p lap chole 27Dec2012 09/19/2011  . Bone lesion 10/04/2013  . Mammogram abnormal 10/04/2013  . Complication of anesthesia   . Dysrhythmia     "fast time"  . Depression   . Anxiety     Past Surgical History  Procedure Laterality Date  . Cesarean section  1985, 1992  . Cholecystectomy  10/27/2011    Procedure: LAPAROSCOPIC CHOLECYSTECTOMY WITH INTRAOPERATIVE CHOLANGIOGRAM;  Surgeon: Adin Hector, MD;  Location: [facility];  Service: General;  Laterality: N/A;  Laparoscopic Chole w/ IOC Single Site  . Eye surgery  1`12-21-10    bil. for tissue growth-laser surgery  . Orif ankle fracture Left 11/13/2015    Procedure: OPEN REDUCTION INTERNAL FIXATION (ORIF) LEFT BIMALLEOLAR ANKLE FRACTURE;  Surgeon: Leandrew Koyanagi, MD;  Location: [place];  Service: Orthopedics;  Laterality: Left;    There were no vitals filed for this visit.  Visit Diagnosis:  Left ankle pain  Difficulty in walking  Status post open reduction with internal fixation (ORIF) of fracture of ankle  Weakness  Localized edema       Subjective Assessment - 01/08/16 0853    Subjective Pain and pulsing in foot and elevate for swellin . Pain more inner foot than lateral sting feeling.  She feels treatment for dizziness helped as room spins less now.    Patient is accompained by: Interpreter   Currently in Pain? Yes   Pain Score 5    Pain Location Ankle   Pain Orientation Left   Multiple Pain Sites No                         [facility] Adult PT Treatment/Exercise - 01/08/16 0858    Modalities   Modalities Vasopneumatic   Vasopneumatic   Number Minutes Vasopneumatic  15 minutes   Vasopnuematic Location  Ankle   Vasopneumatic Pressure Low   Vasopneumatic Temperature  32   Manual Therapy   Manual Therapy Joint mobilization;Soft tissue mobilization;Manual Traction   Joint Mobilization distraction with posterior and lateral glides followed by PROm all directions.    Soft tissue mobilization with tool to ant to post LT lower leg and scar mobs.    Manual Traction with mobs to ankle   Ankle Exercises: Stretches   Gastroc Stretch 2 reps;30 seconds  with strap   Ankle Exercises: Seated   ABC's --  A-B-C x2   Ankle Circles/Pumps AROM;Left;5 reps  clock and counter clockwise   Towel Crunch --  2 sets 25 reps   Towel  Inversion/Eversion 2 reps  25 reps   Heel Raises 15 reps   Toe Raise 15 reps   Other Seated Ankle Exercises PF/DF, INV/ EV 20 x each                   PT Short Term Goals - 01/06/16 1247    PT SHORT TERM GOAL #1   Title Pt will be I with initial HEP for continued strengthening and mobility by 02/06/16.   Time 4   Period Weeks   Status New   PT SHORT TERM GOAL #2   Title L ankle DF AROM will improve to 5 degrees pain-free in order to stand for 10 mins without pain.    Time 4   Period Weeks   Status New   PT SHORT TERM GOAL #3   Title Pt will demonstrate proper TTWB with RW for all gait 100 feet, indep by 02/06/16.     Time 4   Period Weeks   Status New   PT SHORT TERM GOAL  #4   Title Pt will no longer complain of dizziness with sit to supine transfers by 02/05/16.   Time 4   Period Weeks   Status New           PT Long Term Goals - 01/06/16 1249    PT LONG TERM GOAL #1   Title L ankle PF strength will improve to 3+/5 in order to perform stairs pain free by  03/02/16 .    Time 8   Period Weeks   Status New   PT LONG TERM GOAL #2   Title L ankle DF ROM will improve to 15 degrees in order for  pt to walk for 30 mins pain- free with SPC by 03/02/16.   Time 8   Period Weeks   Status New   PT LONG TERM GOAL #3   Title Pt will tolerate SLS on L LE ( once FWB by MD)  for 30 secs to improve functional mobility tolerance and decrease risk for falls by 03/02/16.    Time 8   Period Weeks   Status New   PT LONG TERM GOAL #4   Title L ankle malleoli circumference will improve to 24cm by 03/02/16.   Time 8   Period Weeks   Status New               Plan - 01/08/16 0936    Clinical Impression Statement She is doing well post ORIF LT ankle . continus with stiffness and is only TDWB. She is improved with dizziness so next week a PT who is familiar with treatment for this can do the procedure and see if she can improve further   PT Next Visit Plan Continue manual and modalities , add band exercises or isometrics, cryo or vaso   Consulted and Agree with Plan of Care Patient        Problem List Patient Active Problem List   Diagnosis Date Noted  . Bimalleolar fracture of left ankle 11/13/2015  . S/P ORIF (open reduction internal fixation) fracture 11/13/2015  . Healthcare maintenance 10/08/2015  . Postmenopausal bleeding 10/08/2015  . Pain in joint, ankle and foot 10/08/2015  . Headache disorder 10/08/2015  . Atypical chest pain 10/08/2015  . Essential hypertension 10/08/2015  . Neck pain on right side 08/12/2015  . Grief reaction 03/09/2015  . Midline thoracic back pain 12/11/2014  . Depression (emotion) 08/12/2014  . Dyslipidemia 08/12/2014  .  Headache(784.0) 07/17/2014  . UTI (urinary tract infection) 04/16/2014  . Pap smear for cervical cancer screening 04/16/2014  . Cystocele with rectocele 04/16/2014  . Hyperlipidemia LDL goal < 100 01/22/2014  . Breast lump on left side at 12 o'clock position 10/22/2013  . Chronic constipation 10/09/2013  . Bone lesion 10/04/2013  . Mammogram abnormal 10/04/2013  . Abdominal pain, unspecified site 10/04/2013  . Unspecified essential hypertension 06/10/2013  . Chest pain, unspecified 06/10/2013  . Chronic throat pain 06/10/2013  . Back pain 11/14/2011  . Obesity (BMI 30-39.9) 11/14/2011  . GERD (gastroesophageal reflux disease) 09/19/2011  . Calculus of gallbladder with acute and chronic cholecystitis without obstruction, s/p lap chole 27Dec2012 09/19/2011  . Hemorrhoids, internal, with bleeding 09/19/2011  . ANEMIA-IRON DEFICIENCY 03/19/2009  . CHEST PAIN, ATYPICAL 03/19/2009    Darrel Hoover PT 01/08/2016, 9:40 AM  [facility] [address] [place], [place], [address] Phone: [phone]   Fax:  [phone]  Name: Modestine Stadtler MRN: [id number] Date of Birth: Oct 09, 1961

## 2016-01-11 ENCOUNTER — Ambulatory Visit: Payer: No Typology Code available for payment source

## 2016-01-11 DIAGNOSIS — M25572 Pain in left ankle and joints of left foot: Secondary | ICD-10-CM

## 2016-01-11 DIAGNOSIS — Z8781 Personal history of (healed) traumatic fracture: Secondary | ICD-10-CM

## 2016-01-11 DIAGNOSIS — H8110 Benign paroxysmal vertigo, unspecified ear: Secondary | ICD-10-CM

## 2016-01-11 DIAGNOSIS — R531 Weakness: Secondary | ICD-10-CM

## 2016-01-11 DIAGNOSIS — Z9889 Other specified postprocedural states: Secondary | ICD-10-CM

## 2016-01-11 DIAGNOSIS — R600 Localized edema: Secondary | ICD-10-CM

## 2016-01-11 DIAGNOSIS — R262 Difficulty in walking, not elsewhere classified: Secondary | ICD-10-CM

## 2016-01-11 NOTE — Therapy (Signed)
[place], [place], [address] Phone: [phone]   Fax:  [phone]  Physical Therapy Treatment  Patient Details  Name: Susan Lopez MRN: [id number] Date of Birth: 02/22/1961 Referring Provider: Frankey Shown  Encounter Date: 01/11/2016      PT End of Session - 01/11/16 1639    Visit Number 3   Number of Visits 16   Date for PT Re-Evaluation 03/02/16   PT Start Time [id number]   PT Stop Time 1642   PT Time Calculation (min) 57 min   Activity Tolerance Patient tolerated treatment well   Behavior During Therapy [facility] for tasks assessed/performed      Past Medical History  Diagnosis Date  . Iron deficiency anemia   . Hypertension   . Arthritis   . Gallstones   . Hemorrhoids, external 10-21-11    occ. bothersome  . Calculus of gallbladder with acute and chronic cholecystitis without obstruction, s/p lap chole 27Dec2012 09/19/2011  . Bone lesion 10/04/2013  . Mammogram abnormal 10/04/2013  . Complication of anesthesia   . Dysrhythmia     "fast time"  . Depression   . Anxiety     Past Surgical History  Procedure Laterality Date  . Cesarean section  1985, 1992  . Cholecystectomy  10/27/2011    Procedure: LAPAROSCOPIC CHOLECYSTECTOMY WITH INTRAOPERATIVE CHOLANGIOGRAM;  Surgeon: Adin Hector, MD;  Location: [facility];  Service: General;  Laterality: N/A;  Laparoscopic Chole w/ IOC Single Site  . Eye surgery  1`12-21-10    bil. for tissue growth-laser surgery  . Orif ankle fracture Left 11/13/2015    Procedure: OPEN REDUCTION INTERNAL FIXATION (ORIF) LEFT BIMALLEOLAR ANKLE FRACTURE;  Surgeon: Leandrew Koyanagi, MD;  Location: [facility];  Service: Orthopedics;  Laterality: Left;    There were no vitals filed for this visit.  Visit Diagnosis:  Left ankle pain  Difficulty in walking  Status post open reduction with internal fixation (ORIF) of fracture of ankle  Weakness  Localized edema  BPPV (benign  paroxysmal positional vertigo), unspecified laterality      Subjective Assessment - 01/11/16 1558    Subjective Pain is 6/10 in ankle,. Dizziness is better, but not all the way gone.    Patient is accompained by: Interpreter  [place]   Currently in Pain? Yes   Pain Score 6    Pain Location Ankle   Pain Orientation Left   Pain Descriptors / Indicators Burning;Tightness   Pain Type Surgical pain                         [facility] Adult PT Treatment/Exercise - 01/11/16 0001    Cryotherapy   Number Minutes Cryotherapy 10 Minutes   Cryotherapy Location Ankle   Type of Cryotherapy Ice pack   Manual Therapy   Manual therapy comments Epley x 2   R Posterior canal- nystagmus noted more on 2nd    Joint Mobilization PROM ankle    Soft tissue mobilization STM to ant tib, calf, ankle, foot  retrograde massage    Ankle Exercises: Seated   Ankle Circles/Pumps AROM;Left;5 reps  clock and counter clockwise   Towel Crunch Other (comment)  2 mins toe crunches   Towel Inversion/Eversion Other (comment)  2 mins   Heel Raises 15 reps   Toe Raise 15 reps   Heel Slides 10 reps   Other Seated Ankle Exercises PF/DF, INV/ EV 20 x each  PT Education - 01/11/16 1638    Education provided Yes   Education Details Elevationg and icing above heart. 3 times a day.    Person(s) Educated Patient   Methods Explanation   Comprehension Verbalized understanding          PT Short Term Goals - 01/11/16 1554    PT SHORT TERM GOAL #1   Title Pt will be I with initial HEP for continued strengthening and mobility by 02/06/16.   Time 4   Period Weeks   Status On-going   PT SHORT TERM GOAL #2   Title L ankle DF AROM will improve to 5 degrees pain-free in order to stand for 10 mins without pain.    Time 4   Period Weeks   Status On-going   PT SHORT TERM GOAL #3   Title Pt will demonstrate proper TTWB with RW for all gait 100 feet, indep by 02/06/16.     Time 4    Period Weeks   Status On-going   PT SHORT TERM GOAL #4   Title Pt will no longer complain of dizziness with sit to supine transfers by 02/05/16.   Time 4   Period Weeks   Status On-going           PT Long Term Goals - 01/11/16 1555    PT LONG TERM GOAL #1   Title L ankle PF strength will improve to 3+/5 in order to perform stairs pain free by  03/02/16 .    Time 8   Period Weeks   Status On-going   PT LONG TERM GOAL #2   Title L ankle DF ROM will improve to 15 degrees in order for  pt to walk for 30 mins pain- free with SPC by 03/02/16.   Time 8   Period Weeks   PT LONG TERM GOAL #3   Title Pt will tolerate SLS on L LE ( once FWB by MD)  for 30 secs to improve functional mobility tolerance and decrease risk for falls by 03/02/16.    Time 8   Period Weeks   PT LONG TERM GOAL #4   Title L ankle malleoli circumference will improve to 24cm by 03/02/16.   Time 8   Period Weeks   Status On-going               Plan - 01/11/16 1639    Clinical Impression Statement Nystagmus noted with both trails of Epley, on 2nd trial more than 1st. Pt still having significant pain with any ankle movement and weightbearing. Pt was NWB for some distance back to room today with standard walker to prevent pain.    PT Next Visit Plan Continue manual and modalities , add isometrics, AROM, heel slides, cryo or vaso, reassess dizziness/ BPPV and Eply, if appropriate.    PT Home Exercise Plan Ankle AROM    Consulted and Agree with Plan of Care Patient        Problem List Patient Active Problem List   Diagnosis Date Noted  . Bimalleolar fracture of left ankle 11/13/2015  . S/P ORIF (open reduction internal fixation) fracture 11/13/2015  . Healthcare maintenance 10/08/2015  . Postmenopausal bleeding 10/08/2015  . Pain in joint, ankle and foot 10/08/2015  . Headache disorder 10/08/2015  . Atypical chest pain 10/08/2015  . Essential hypertension 10/08/2015  . Neck pain on right side 08/12/2015  .  Grief reaction 03/09/2015  . Midline thoracic back pain 12/11/2014  . Depression (emotion) 08/12/2014  .  Dyslipidemia 08/12/2014  . Headache(784.0) 07/17/2014  . UTI (urinary tract infection) 04/16/2014  . Pap smear for cervical cancer screening 04/16/2014  . Cystocele with rectocele 04/16/2014  . Hyperlipidemia LDL goal < 100 01/22/2014  . Breast lump on left side at 12 o'clock position 10/22/2013  . Chronic constipation 10/09/2013  . Bone lesion 10/04/2013  . Mammogram abnormal 10/04/2013  . Abdominal pain, unspecified site 10/04/2013  . Unspecified essential hypertension 06/10/2013  . Chest pain, unspecified 06/10/2013  . Chronic throat pain 06/10/2013  . Back pain 11/14/2011  . Obesity (BMI 30-39.9) 11/14/2011  . GERD (gastroesophageal reflux disease) 09/19/2011  . Calculus of gallbladder with acute and chronic cholecystitis without obstruction, s/p lap chole 27Dec2012 09/19/2011  . Hemorrhoids, internal, with bleeding 09/19/2011  . ANEMIA-IRON DEFICIENCY 03/19/2009  . CHEST PAIN, ATYPICAL 03/19/2009    Dollene Cleveland, PT 01/11/2016, 4:45 PM  [place], [place], [address] Phone: [phone]   Fax:  [phone]  Name: Susan Lopez MRN: [id number] Date of Birth: Jul 31, 1961

## 2016-01-18 ENCOUNTER — Ambulatory Visit: Payer: No Typology Code available for payment source | Admitting: Physical Therapy

## 2016-01-18 DIAGNOSIS — Z9889 Other specified postprocedural states: Secondary | ICD-10-CM

## 2016-01-18 DIAGNOSIS — R262 Difficulty in walking, not elsewhere classified: Secondary | ICD-10-CM

## 2016-01-18 DIAGNOSIS — R600 Localized edema: Secondary | ICD-10-CM

## 2016-01-18 DIAGNOSIS — Z8781 Personal history of (healed) traumatic fracture: Secondary | ICD-10-CM

## 2016-01-18 DIAGNOSIS — M25572 Pain in left ankle and joints of left foot: Secondary | ICD-10-CM

## 2016-01-18 DIAGNOSIS — R531 Weakness: Secondary | ICD-10-CM

## 2016-01-18 NOTE — Therapy (Signed)
[place], [place], [address] Phone: [phone]   Fax:  [phone]  Physical Therapy Treatment  Patient Details  Name: Susan Lopez MRN: [id number] Date of Birth: [birth year] Referring Provider: Frankey Shown  Encounter Date: 01/18/2016      PT End of Session - 01/18/16 0904    Visit Number 4   Number of Visits 16   Date for PT Re-Evaluation 03/02/16   PT Start Time 0850   PT Stop Time 0940   PT Time Calculation (min) 50 min      Past Medical History  Diagnosis Date  . Iron deficiency anemia   . Hypertension   . Arthritis   . Gallstones   . Hemorrhoids, external 10-21-11    occ. bothersome  . Calculus of gallbladder with acute and chronic cholecystitis without obstruction, s/p lap chole 27Dec2012 09/19/2011  . Bone lesion 10/04/2013  . Mammogram abnormal 10/04/2013  . Complication of anesthesia   . Dysrhythmia     "fast time"  . Depression   . Anxiety     Past Surgical History  Procedure Laterality Date  . Cesarean section  1985, 1992  . Cholecystectomy  10/27/2011    Procedure: LAPAROSCOPIC CHOLECYSTECTOMY WITH INTRAOPERATIVE CHOLANGIOGRAM;  Surgeon: Adin Hector, MD;  Location: [facility];  Service: General;  Laterality: N/A;  Laparoscopic Chole w/ IOC Single Site  . Eye surgery  1`12-21-10    bil. for tissue growth-laser surgery  . Orif ankle fracture Left 11/13/2015    Procedure: OPEN REDUCTION INTERNAL FIXATION (ORIF) LEFT BIMALLEOLAR ANKLE FRACTURE;  Surgeon: Leandrew Koyanagi, MD;  Location: [facility];  Service: Orthopedics;  Laterality: Left;    There were no vitals filed for this visit.  Visit Diagnosis:  Left ankle pain  Difficulty in walking  Status post open reduction with internal fixation (ORIF) of fracture of ankle  Weakness  Localized edema      Subjective Assessment - 01/18/16 0900    Subjective The dizziness is better but the ankle is painful. I am ready to walk.    Currently in Pain? Yes   Pain Score 6    Pain Location Ankle   Pain Orientation Left   Pain Descriptors / Indicators Burning;Pins and needles   Aggravating Factors  dependent position   Pain Relieving Factors elevation, medication            [facility] PT Assessment - 01/18/16 0933    AROM   Left Ankle Dorsiflexion 3   Left Ankle Plantar Flexion 45   Left Ankle Inversion 40   Left Ankle Eversion 4                     [facility] Adult PT Treatment/Exercise - 01/18/16 0957    Modalities   Modalities Vasopneumatic   Vasopneumatic   Number Minutes Vasopneumatic  15 minutes   Vasopnuematic Location  Ankle   Vasopneumatic Pressure Low   Vasopneumatic Temperature  32   Ankle Exercises: Seated   Ankle Circles/Pumps AROM;Left;5 reps  clock and counter clockwise   BAPS Level 2   Other Seated Ankle Exercises isometrics all planes manually and then using mat table leg to simulate HEP  10 reps, 5 sec   Ankle Exercises: Aerobic   Stationary Bike Rec bike full revolutions x 6 minutes  less pain post                 PT Education - 01/18/16 0932  Education provided Yes   Education Details Isometrics ankle 4 way.    Person(s) Educated Patient   Methods Explanation;Handout   Comprehension Verbalized understanding          PT Short Term Goals - 01/18/16 0905    PT SHORT TERM GOAL #1   Title Pt will be I with initial HEP for continued strengthening and mobility by 02/06/16.   Time 4   Period Weeks   Status Achieved   PT SHORT TERM GOAL #2   Title L ankle DF AROM will improve to 5 degrees pain-free in order to stand for 10 mins without pain.    Time 4   Period Weeks   Status Partially Met   PT SHORT TERM GOAL #3   Title Pt will demonstrate proper TTWB with RW for all gait 100 feet, indep by 02/06/16.     Time 4   Period Weeks   Status Achieved   PT SHORT TERM GOAL #4   Title Pt will no longer complain of dizziness with sit to supine transfers by 02/05/16.    Time 4   Period Weeks   Status Achieved           PT Long Term Goals - 01/18/16 1001    PT LONG TERM GOAL #1   Title L ankle PF strength will improve to 3+/5 in order to perform stairs pain free by  03/02/16 .    Time 8   Period Weeks   Status On-going   PT LONG TERM GOAL #2   Title L ankle DF ROM will improve to 15 degrees in order for  pt to walk for 30 mins pain- free with SPC by 03/02/16.   Time 8   Period Weeks   Status On-going   PT LONG TERM GOAL #3   Title Pt will tolerate SLS on L LE ( once FWB by MD)  for 30 secs to improve functional mobility tolerance and decrease risk for falls by 03/02/16.    Time 8   Period Weeks   Status On-going   PT LONG TERM GOAL #4   Title L ankle malleoli circumference will improve to 24cm by 03/02/16.   Time 8   Period Weeks   Status On-going               Plan - 01/18/16 0906    Clinical Impression Statement Pt reports she is able to stand with RW for 10 minutes at home. She demonstrates good safety and technique with gait using RW and TDWB. She reports dizziness continues to improve. She is most concerned about her ankle pain. Instructed pt in seated baps exercises and isometrics as well as full revolutions on recumbent bike. Pt reports no pain after therex however reports continued numbess in all 5 toes. Encourged pt to perform self massage to areas of tenderness especially lateral lower leg. STG#1,3,4 MET. #2 partially met. AROM improved all planes. Lateral ankle and lower leg pain with inversion. Cues to press gently with isometrics.   PT Next Visit Plan Continue rec bike, review isometrics, reassess dizziness/BPPV and EPly if appropriate, manual to lateral lower leg.         Problem List Patient Active Problem List   Diagnosis Date Noted  . Bimalleolar fracture of left ankle 11/13/2015  . S/P ORIF (open reduction internal fixation) fracture 11/13/2015  . Healthcare maintenance 10/08/2015  . Postmenopausal bleeding 10/08/2015   . Pain in joint, ankle and foot 10/08/2015  .  Headache disorder 10/08/2015  . Atypical chest pain 10/08/2015  . Essential hypertension 10/08/2015  . Neck pain on right side 08/12/2015  . Grief reaction 03/09/2015  . Midline thoracic back pain 12/11/2014  . Depression (emotion) 08/12/2014  . Dyslipidemia 08/12/2014  . Headache(784.0) 07/17/2014  . UTI (urinary tract infection) 04/16/2014  . Pap smear for cervical cancer screening 04/16/2014  . Cystocele with rectocele 04/16/2014  . Hyperlipidemia LDL goal < 100 01/22/2014  . Breast lump on left side at 12 o'clock position 10/22/2013  . Chronic constipation 10/09/2013  . Bone lesion 10/04/2013  . Mammogram abnormal 10/04/2013  . Abdominal pain, unspecified site 10/04/2013  . Unspecified essential hypertension 06/10/2013  . Chest pain, unspecified 06/10/2013  . Chronic throat pain 06/10/2013  . Back pain 11/14/2011  . Obesity (BMI 30-39.9) 11/14/2011  . GERD (gastroesophageal reflux disease) 09/19/2011  . Calculus of gallbladder with acute and chronic cholecystitis without obstruction, s/p lap chole 27Dec2012 09/19/2011  . Hemorrhoids, internal, with bleeding 09/19/2011  . ANEMIA-IRON DEFICIENCY 03/19/2009  . CHEST PAIN, ATYPICAL 03/19/2009    Dorene Ar, PTA 01/18/2016, 10:02 AM  [facility] [address] [place], [place], [address] Phone: [phone]   Fax:  [phone]  Name: Susan Lopez MRN: [id number] Date of Birth: 07-20-1961

## 2016-01-18 NOTE — Patient Instructions (Signed)
Plantar Flexion: Isometric   Press left foot into ball or rolled pillow against wall. Hold ___5_ seconds. Relax. Repeat __10__ times per set. Do _2___ sets per session. Do __2__ sessions per day.  http://orth.exer.us/0   Inversion: Isometric   Press inner borders of feet into ball or rolled pillow between feet. Hold _5___ seconds. Relax. Repeat __10__ times per set. Do __2__ sets per session. Do _2___ sessions per day.  http://orth.exer.us/6   Pillow Squeeze (Isometric Dorsiflexion)   Lying with pillow between feet, one foot on top, squeeze feet together, bringing bottom foot up while pushing top one down. Hold __5__ seconds. Repeat with other foot on top. Repeat _10___ times. Do __2__ sessions per day.  http://[email or web address].exer.us/421   Eversion: Isometric   Press outer border of right foot into ball or rolled pillow against wall. Hold _5___ seconds. Relax. Repeat _10___ times per set. Do __2__ sets per session. Do ___2_ sessions per day.

## 2016-01-21 MED FILL — ATORVASTATIN 20 MG TABLET: 20 | 30 days supply | Qty: 30 | Fill #4

## 2016-01-21 MED FILL — OMEPRAZOLE DR 20 MG CAPSULE: 20 | 30 days supply | Qty: 60 | Fill #3

## 2016-01-21 MED FILL — GABAPENTIN 100 MG CAPSULE: 100 | 30 days supply | Qty: 60 | Fill #3

## 2016-01-22 ENCOUNTER — Ambulatory Visit: Payer: No Typology Code available for payment source

## 2016-01-22 DIAGNOSIS — Z8781 Personal history of (healed) traumatic fracture: Secondary | ICD-10-CM

## 2016-01-22 DIAGNOSIS — R262 Difficulty in walking, not elsewhere classified: Secondary | ICD-10-CM

## 2016-01-22 DIAGNOSIS — M25572 Pain in left ankle and joints of left foot: Secondary | ICD-10-CM

## 2016-01-22 DIAGNOSIS — R531 Weakness: Secondary | ICD-10-CM

## 2016-01-22 DIAGNOSIS — R600 Localized edema: Secondary | ICD-10-CM

## 2016-01-22 DIAGNOSIS — Z9889 Other specified postprocedural states: Secondary | ICD-10-CM

## 2016-01-22 NOTE — Therapy (Signed)
[place], [place], [address] Phone: [phone]   Fax:  [phone]  Physical Therapy Treatment  Patient Details  Name: Susan Lopez MRN: [id number] Date of Birth: 1961/08/11 Referring Provider: Frankey Shown  Encounter Date: 01/22/2016      PT End of Session - 01/22/16 1200    Visit Number 5   Number of Visits 16   Date for PT Re-Evaluation 03/02/16   PT Start Time 7741  Pt was late   PT Stop Time 1145   PT Time Calculation (min) 32 min   Activity Tolerance Patient tolerated treatment well;No increased pain   Behavior During Therapy [facility] for tasks assessed/performed      Past Medical History  Diagnosis Date  . Iron deficiency anemia   . Hypertension   . Arthritis   . Gallstones   . Hemorrhoids, external 10-21-11    occ. bothersome  . Calculus of gallbladder with acute and chronic cholecystitis without obstruction, s/p lap chole 27Dec2012 09/19/2011  . Bone lesion 10/04/2013  . Mammogram abnormal 10/04/2013  . Complication of anesthesia   . Dysrhythmia     "fast time"  . Depression   . Anxiety     Past Surgical History  Procedure Laterality Date  . Cesarean section  1985, 1992  . Cholecystectomy  10/27/2011    Procedure: LAPAROSCOPIC CHOLECYSTECTOMY WITH INTRAOPERATIVE CHOLANGIOGRAM;  Surgeon: Adin Hector, MD;  Location: [facility];  Service: General;  Laterality: N/A;  Laparoscopic Chole w/ IOC Single Site  . Eye surgery  1`12-21-10    bil. for tissue growth-laser surgery  . Orif ankle fracture Left 11/13/2015    Procedure: OPEN REDUCTION INTERNAL FIXATION (ORIF) LEFT BIMALLEOLAR ANKLE FRACTURE;  Surgeon: Leandrew Koyanagi, MD;  Location: [facility];  Service: Orthopedics;  Laterality: Left;    There were no vitals filed for this visit.  Visit Diagnosis:  Left ankle pain  Difficulty in walking  Status post open reduction with internal fixation (ORIF) of fracture of  ankle  Weakness  Localized edema      Subjective Assessment - 01/22/16 1157    Subjective Walking with walker with foot on ground now. Still have pain but better.    Patient is accompained by: Interpreter   Currently in Pain? Yes   Pain Score 5    Pain Location Ankle   Pain Orientation Left   Pain Descriptors / Indicators Burning;Pins and needles   Pain Type Surgical pain   Pain Onset More than a month ago   Pain Frequency Constant   Multiple Pain Sites No                         [facility] Adult PT Treatment/Exercise - 01/22/16 1159    Manual Therapy   Joint Mobilization AP and PA and lateral glides Gr 3 and mobs to toes and for foot   Soft tissue mobilization STM to ant tib, calf, ankle, foot with and without tool   Manual Traction with mobs to ankle and to toes.    Ankle Exercises: Standing   Other Standing Ankle Exercises briefly reviewed isometrics                  PT Short Term Goals - 01/18/16 0905    PT SHORT TERM GOAL #1   Title Pt will be I with initial HEP for continued strengthening and mobility by 02/06/16.   Time 4   Period Weeks  Status Achieved   PT SHORT TERM GOAL #2   Title L ankle DF AROM will improve to 5 degrees pain-free in order to stand for 10 mins without pain.    Time 4   Period Weeks   Status Partially Met   PT SHORT TERM GOAL #3   Title Pt will demonstrate proper TTWB with RW for all gait 100 feet, indep by 02/06/16.     Time 4   Period Weeks   Status Achieved   PT SHORT TERM GOAL #4   Title Pt will no longer complain of dizziness with sit to supine transfers by 02/05/16.   Time 4   Period Weeks   Status Achieved           PT Long Term Goals - 01/18/16 1001    PT LONG TERM GOAL #1   Title L ankle PF strength will improve to 3+/5 in order to perform stairs pain free by  03/02/16 .    Time 8   Period Weeks   Status On-going   PT LONG TERM GOAL #2   Title L ankle DF ROM will improve to 15 degrees in order for   pt to walk for 30 mins pain- free with SPC by 03/02/16.   Time 8   Period Weeks   Status On-going   PT LONG TERM GOAL #3   Title Pt will tolerate SLS on L LE ( once FWB by MD)  for 30 secs to improve functional mobility tolerance and decrease risk for falls by 03/02/16.    Time 8   Period Weeks   Status On-going   PT LONG TERM GOAL #4   Title L ankle malleoli circumference will improve to 24cm by 03/02/16.   Time 8   Period Weeks   Status On-going               Plan - 01/22/16 1201    Clinical Impression Statement She is walking with more weight (very light) to foot and minimal edema mostly around lateral mallleolus. . This was better post session and she reported less pain and softerning to tissue and  and feels looser   PT Next Visit Plan Continue rec bike, reassess dizziness/BPPV and EPly if appropriate, manual to  lower leg. possible band exercises, measure ROM   Consulted and Agree with Plan of Care Patient        Problem List Patient Active Problem List   Diagnosis Date Noted  . Bimalleolar fracture of left ankle 11/13/2015  . S/P ORIF (open reduction internal fixation) fracture 11/13/2015  . Healthcare maintenance 10/08/2015  . Postmenopausal bleeding 10/08/2015  . Pain in joint, ankle and foot 10/08/2015  . Headache disorder 10/08/2015  . Atypical chest pain 10/08/2015  . Essential hypertension 10/08/2015  . Neck pain on right side 08/12/2015  . Grief reaction 03/09/2015  . Midline thoracic back pain 12/11/2014  . Depression (emotion) 08/12/2014  . Dyslipidemia 08/12/2014  . Headache(784.0) 07/17/2014  . UTI (urinary tract infection) 04/16/2014  . Pap smear for cervical cancer screening 04/16/2014  . Cystocele with rectocele 04/16/2014  . Hyperlipidemia LDL goal < 100 01/22/2014  . Breast lump on left side at 12 o'clock position 10/22/2013  . Chronic constipation 10/09/2013  . Bone lesion 10/04/2013  . Mammogram abnormal 10/04/2013  . Abdominal pain,  unspecified site 10/04/2013  . Unspecified essential hypertension 06/10/2013  . Chest pain, unspecified 06/10/2013  . Chronic throat pain 06/10/2013  . Back pain 11/14/2011  .  Obesity (BMI 30-39.9) 11/14/2011  . GERD (gastroesophageal reflux disease) 09/19/2011  . Calculus of gallbladder with acute and chronic cholecystitis without obstruction, s/p lap chole 27Dec2012 09/19/2011  . Hemorrhoids, internal, with bleeding 09/19/2011  . ANEMIA-IRON DEFICIENCY 03/19/2009  . CHEST PAIN, ATYPICAL 03/19/2009    Darrel Hoover PT 01/22/2016, 12:05 PM  [facility] [address] [place], [place], [address] Phone: [phone]   Fax:  [phone]  Name: Earline Stiner MRN: [id number] Date of Birth: 1961-06-02

## 2016-01-25 ENCOUNTER — Ambulatory Visit: Payer: No Typology Code available for payment source

## 2016-01-25 DIAGNOSIS — H8110 Benign paroxysmal vertigo, unspecified ear: Secondary | ICD-10-CM

## 2016-01-25 DIAGNOSIS — Z8781 Personal history of (healed) traumatic fracture: Secondary | ICD-10-CM

## 2016-01-25 DIAGNOSIS — R262 Difficulty in walking, not elsewhere classified: Secondary | ICD-10-CM

## 2016-01-25 DIAGNOSIS — Z9889 Other specified postprocedural states: Principal | ICD-10-CM

## 2016-01-25 DIAGNOSIS — R531 Weakness: Secondary | ICD-10-CM

## 2016-01-25 DIAGNOSIS — R600 Localized edema: Secondary | ICD-10-CM

## 2016-01-25 DIAGNOSIS — M25572 Pain in left ankle and joints of left foot: Secondary | ICD-10-CM

## 2016-01-25 NOTE — Therapy (Signed)
[place], [place], [address] Phone: [phone]   Fax:  [phone]  Physical Therapy Treatment  Patient Details  Name: Corvette Orser MRN: [id number] Date of Birth: Jun 23, 1961 Referring Provider: Frankey Shown  Encounter Date: 01/25/2016      PT End of Session - 01/25/16 0913    Visit Number 6   Number of Visits 16   Date for PT Re-Evaluation 03/02/16   PT Start Time 4920  pt was late   PT Stop Time 0945   PT Time Calculation (min) 52 min   Activity Tolerance Patient tolerated treatment well;No increased pain   Behavior During Therapy [facility] for tasks assessed/performed      Past Medical History  Diagnosis Date  . Iron deficiency anemia   . Hypertension   . Arthritis   . Gallstones   . Hemorrhoids, external 10-21-11    occ. bothersome  . Calculus of gallbladder with acute and chronic cholecystitis without obstruction, s/p lap chole 27Dec2012 09/19/2011  . Bone lesion 10/04/2013  . Mammogram abnormal 10/04/2013  . Complication of anesthesia   . Dysrhythmia     "fast time"  . Depression   . Anxiety     Past Surgical History  Procedure Laterality Date  . Cesarean section  1985, 1992  . Cholecystectomy  10/27/2011    Procedure: LAPAROSCOPIC CHOLECYSTECTOMY WITH INTRAOPERATIVE CHOLANGIOGRAM;  Surgeon: Adin Hector, MD;  Location: [facility];  Service: General;  Laterality: N/A;  Laparoscopic Chole w/ IOC Single Site  . Eye surgery  1`12-21-10    bil. for tissue growth-laser surgery  . Orif ankle fracture Left 11/13/2015    Procedure: OPEN REDUCTION INTERNAL FIXATION (ORIF) LEFT BIMALLEOLAR ANKLE FRACTURE;  Surgeon: Leandrew Koyanagi, MD;  Location: [place];  Service: Orthopedics;  Laterality: Left;    There were no vitals filed for this visit.  Visit Diagnosis:  Status post open reduction with internal fixation (ORIF) of fracture of ankle  Difficulty in walking  Left ankle pain  Localized  edema  Weakness  BPPV (benign paroxysmal positional vertigo), unspecified laterality      Subjective Assessment - 01/25/16 0858    Subjective Pt rpeorts increased pain yesterday attributes it to walking on it more  at church over the weekend. Pain is 7/10    Currently in Pain? Yes   Pain Score 7    Pain Location Ankle   Pain Orientation Left   Pain Descriptors / Indicators Burning;Pins and needles   Pain Type Surgical pain   Pain Onset More than a month ago   Pain Frequency Constant                         [facility] Adult PT Treatment/Exercise - 01/25/16 0001    Vasopneumatic   Number Minutes Vasopneumatic  15 minutes   Vasopnuematic Location  Ankle   Vasopneumatic Pressure Low   Vasopneumatic Temperature  32   Manual Therapy   Soft tissue mobilization STM to ant tib, calf, ankle, foot without tool   Ankle Exercises: Seated   ABC's 1 rep  HEP   Ankle Circles/Pumps AROM;Left;20 reps  clock and counter clockwise   Towel Crunch Other (comment)  2 mins toe crunches   Heel Raises 15 reps   Toe Raise 15 reps   BAPS Level 2;10 reps   Other Seated Ankle Exercises isometrics all planes manually and then using mat table leg to simulate HEP  10 reps,  5 sec                  PT Short Term Goals - 01/18/16 0905    PT SHORT TERM GOAL #1   Title Pt will be I with initial HEP for continued strengthening and mobility by 02/06/16.   Time 4   Period Weeks   Status Achieved   PT SHORT TERM GOAL #2   Title L ankle DF AROM will improve to 5 degrees pain-free in order to stand for 10 mins without pain.    Time 4   Period Weeks   Status Partially Met   PT SHORT TERM GOAL #3   Title Pt will demonstrate proper TTWB with RW for all gait 100 feet, indep by 02/06/16.     Time 4   Period Weeks   Status Achieved   PT SHORT TERM GOAL #4   Title Pt will no longer complain of dizziness with sit to supine transfers by 02/05/16.   Time 4   Period Weeks   Status Achieved            PT Long Term Goals - 01/18/16 1001    PT LONG TERM GOAL #1   Title L ankle PF strength will improve to 3+/5 in order to perform stairs pain free by  03/02/16 .    Time 8   Period Weeks   Status On-going   PT LONG TERM GOAL #2   Title L ankle DF ROM will improve to 15 degrees in order for  pt to walk for 30 mins pain- free with SPC by 03/02/16.   Time 8   Period Weeks   Status On-going   PT LONG TERM GOAL #3   Title Pt will tolerate SLS on L LE ( once FWB by MD)  for 30 secs to improve functional mobility tolerance and decrease risk for falls by 03/02/16.    Time 8   Period Weeks   Status On-going   PT LONG TERM GOAL #4   Title L ankle malleoli circumference will improve to 24cm by 03/02/16.   Time 8   Period Weeks   Status On-going               Plan - 01/25/16 2426    Clinical Impression Statement Reviewed TDWB status per MD orders, and possiblility of increased pain after walking more over the weekend. Pt sees MD today after appt. Pt demonstrates improved motion of ankle and WB tolerance.    PT Next Visit Plan Continue rec bike, manual to  lower leg. possible band exercises, measure ROM. MD visit? Change in WB status?.    PT Home Exercise Plan Ankle AROM , ankle isometrics   Consulted and Agree with Plan of Care Patient        Problem List Patient Active Problem List   Diagnosis Date Noted  . Bimalleolar fracture of left ankle 11/13/2015  . S/P ORIF (open reduction internal fixation) fracture 11/13/2015  . Healthcare maintenance 10/08/2015  . Postmenopausal bleeding 10/08/2015  . Pain in joint, ankle and foot 10/08/2015  . Headache disorder 10/08/2015  . Atypical chest pain 10/08/2015  . Essential hypertension 10/08/2015  . Neck pain on right side 08/12/2015  . Grief reaction 03/09/2015  . Midline thoracic back pain 12/11/2014  . Depression (emotion) 08/12/2014  . Dyslipidemia 08/12/2014  . Headache(784.0) 07/17/2014  . UTI (urinary tract infection)  04/16/2014  . Pap smear for cervical cancer screening 04/16/2014  . Cystocele  with rectocele 04/16/2014  . Hyperlipidemia LDL goal < 100 01/22/2014  . Breast lump on left side at 12 o'clock position 10/22/2013  . Chronic constipation 10/09/2013  . Bone lesion 10/04/2013  . Mammogram abnormal 10/04/2013  . Abdominal pain, unspecified site 10/04/2013  . Unspecified essential hypertension 06/10/2013  . Chest pain, unspecified 06/10/2013  . Chronic throat pain 06/10/2013  . Back pain 11/14/2011  . Obesity (BMI 30-39.9) 11/14/2011  . GERD (gastroesophageal reflux disease) 09/19/2011  . Calculus of gallbladder with acute and chronic cholecystitis without obstruction, s/p lap chole 27Dec2012 09/19/2011  . Hemorrhoids, internal, with bleeding 09/19/2011  . ANEMIA-IRON DEFICIENCY 03/19/2009  . CHEST PAIN, ATYPICAL 03/19/2009    Dollene Cleveland , PT  01/25/2016, 9:34 AM  [facility] [address] [place], [place], [address] Phone: [phone]   Fax:  [phone]  Name: Amit Meloy MRN: [id number] Date of Birth: November 21, 1960

## 2016-01-29 ENCOUNTER — Ambulatory Visit: Payer: No Typology Code available for payment source | Admitting: Physical Therapy

## 2016-01-29 DIAGNOSIS — M25572 Pain in left ankle and joints of left foot: Secondary | ICD-10-CM

## 2016-01-29 DIAGNOSIS — R262 Difficulty in walking, not elsewhere classified: Secondary | ICD-10-CM

## 2016-01-29 DIAGNOSIS — R600 Localized edema: Secondary | ICD-10-CM

## 2016-01-29 DIAGNOSIS — R531 Weakness: Secondary | ICD-10-CM

## 2016-01-29 DIAGNOSIS — Z9889 Other specified postprocedural states: Principal | ICD-10-CM

## 2016-01-29 DIAGNOSIS — Z8781 Personal history of (healed) traumatic fracture: Secondary | ICD-10-CM

## 2016-01-29 NOTE — Patient Instructions (Signed)
Inversion: Resisted   Cross legs with right leg underneath, foot in tubing loop. Hold tubing around other foot to resist and turn foot in. Repeat _10___ times per set. Do _22___ sets per session. Do 2____ sessions per day.  http://orth.exer.us/12   Copyright  VHI. All rights reserved.  Eversion: Resisted   With right foot in tubing loop, hold tubing around other foot to resist and turn foot out. Repeat __10__ times per set. Do __2__ sets per session. Do _2___ sessions per day.  http://orth.exer.us/14   Copyright  VHI. All rights reserved.  Plantar Flexion: Resisted   Anchor behind, tubing around left foot, press down. Repeat _10___ times per set. Do __2__ sets per session. Do _2___ sessions per day.  http://orth.exer.us/10   Copyright  VHI. All rights reserved.  Dorsiflexion: Resisted   Facing anchor, tubing around left foot, pull toward face.  Repeat _10___ times per set. Do __2__ sets per session. Do _2___ sessions per day.  http://orth.exer.us/8   Copyright  VHI. All rights reserved.

## 2016-01-29 NOTE — Therapy (Signed)
[place], [place], [address] Phone: [phone]   Fax:  [phone]  Physical Therapy Treatment  Patient Details  Name: Susan Lopez MRN: [id number] Date of Birth: July 04, 1961 Referring Provider: Frankey Shown  Encounter Date: 01/29/2016      PT End of Session - 01/29/16 0940    Visit Number 7   Number of Visits 16   Date for PT Re-Evaluation 03/02/16   PT Start Time 0905   PT Stop Time 0737   PT Time Calculation (min) 57 min      Past Medical History  Diagnosis Date  . Iron deficiency anemia   . Hypertension   . Arthritis   . Gallstones   . Hemorrhoids, external 10-21-11    occ. bothersome  . Calculus of gallbladder with acute and chronic cholecystitis without obstruction, s/p lap chole 27Dec2012 09/19/2011  . Bone lesion 10/04/2013  . Mammogram abnormal 10/04/2013  . Complication of anesthesia   . Dysrhythmia     "fast time"  . Depression   . Anxiety     Past Surgical History  Procedure Laterality Date  . Cesarean section  1985, 1992  . Cholecystectomy  10/27/2011    Procedure: LAPAROSCOPIC CHOLECYSTECTOMY WITH INTRAOPERATIVE CHOLANGIOGRAM;  Surgeon: Adin Hector, MD;  Location: [facility];  Service: General;  Laterality: N/A;  Laparoscopic Chole w/ IOC Single Site  . Eye surgery  1`12-21-10    bil. for tissue growth-laser surgery  . Orif ankle fracture Left 11/13/2015    Procedure: OPEN REDUCTION INTERNAL FIXATION (ORIF) LEFT BIMALLEOLAR ANKLE FRACTURE;  Surgeon: Leandrew Koyanagi, MD;  Location: [place];  Service: Orthopedics;  Laterality: Left;    There were no vitals filed for this visit.  Visit Diagnosis:  Status post open reduction with internal fixation (ORIF) of fracture of ankle  Difficulty in walking  Left ankle pain  Localized edema  Weakness      Subjective Assessment - 01/29/16 0925    Subjective No pain. Knee feels weak.                           [facility] Adult PT Treatment/Exercise - 01/29/16 0001    Ambulation/Gait   Ambulation/Gait Yes   Ambulation/Gait Assistance 6: Modified independent (Device/Increase time)   Ambulation Distance (Feet) 50 Feet   Assistive device R Axillary Crutch   Gait Pattern Step-to pattern   Ambulation Surface Level;Indoor   Gait Comments Pt enters with 2 cruthes and new order for WBAT, MD wants her to wean from RW. Instructed pt in gait with SPC with good toerance and asked to to only practive indoors.    Vasopneumatic   Number Minutes Vasopneumatic  15 minutes   Vasopnuematic Location  Ankle   Vasopneumatic Pressure Low   Vasopneumatic Temperature  32   Ankle Exercises: Seated   [place] Pickup 1 minute   Heel Raises 15 reps   Toe Raise 15 reps   BAPS Level 2;10 reps   Ankle Exercises: Aerobic   Stationary Bike Rec bike full revolutions x 6 minutes  less pain post    Ankle Exercises: Supine   T-Band performed seated x 15 each yellow added to HEP                PT Education - 01/29/16 1005    Education provided Yes   Education Details yellow band ankle 4 way   Person(s) Educated Patient   Methods Explanation;Handout  Comprehension Verbalized understanding          PT Short Term Goals - 01/18/16 0905    PT SHORT TERM GOAL #1   Title Pt will be I with initial HEP for continued strengthening and mobility by 02/06/16.   Time 4   Period Weeks   Status Achieved   PT SHORT TERM GOAL #2   Title L ankle DF AROM will improve to 5 degrees pain-free in order to stand for 10 mins without pain.    Time 4   Period Weeks   Status Partially Met   PT SHORT TERM GOAL #3   Title Pt will demonstrate proper TTWB with RW for all gait 100 feet, indep by 02/06/16.     Time 4   Period Weeks   Status Achieved   PT SHORT TERM GOAL #4   Title Pt will no longer complain of dizziness with sit to supine transfers by 02/05/16.   Time 4   Period Weeks   Status Achieved           PT Long Term Goals -  01/18/16 1001    PT LONG TERM GOAL #1   Title L ankle PF strength will improve to 3+/5 in order to perform stairs pain free by  03/02/16 .    Time 8   Period Weeks   Status On-going   PT LONG TERM GOAL #2   Title L ankle DF ROM will improve to 15 degrees in order for  pt to walk for 30 mins pain- free with SPC by 03/02/16.   Time 8   Period Weeks   Status On-going   PT LONG TERM GOAL #3   Title Pt will tolerate SLS on L LE ( once FWB by MD)  for 30 secs to improve functional mobility tolerance and decrease risk for falls by 03/02/16.    Time 8   Period Weeks   Status On-going   PT LONG TERM GOAL #4   Title L ankle malleoli circumference will improve to 24cm by 03/02/16.   Time 8   Period Weeks   Status On-going               Plan - 01/29/16 7564    Clinical Impression Statement 4/10 pain with gait using one crutch. Per new MD orders WB as tolerated.  May 8th will remove a screw. Pt to practive one crutch in home only. Use two crutches if painful or if outdoors. Pt instructed in yellow band resisted anlkle exercises with no increased pain added to HEP.    PT Next Visit Plan assess tolerance to gait with 1 crutch vs 2, review band exercises, weight shifts, CHECK GOALS        Problem List Patient Active Problem List   Diagnosis Date Noted  . Bimalleolar fracture of left ankle 11/13/2015  . S/P ORIF (open reduction internal fixation) fracture 11/13/2015  . Healthcare maintenance 10/08/2015  . Postmenopausal bleeding 10/08/2015  . Pain in joint, ankle and foot 10/08/2015  . Headache disorder 10/08/2015  . Atypical chest pain 10/08/2015  . Essential hypertension 10/08/2015  . Neck pain on right side 08/12/2015  . Grief reaction 03/09/2015  . Midline thoracic back pain 12/11/2014  . Depression (emotion) 08/12/2014  . Dyslipidemia 08/12/2014  . Headache(784.0) 07/17/2014  . UTI (urinary tract infection) 04/16/2014  . Pap smear for cervical cancer screening 04/16/2014  .  Cystocele with rectocele 04/16/2014  . Hyperlipidemia LDL goal < 100 01/22/2014  .  Breast lump on left side at 12 o'clock position 10/22/2013  . Chronic constipation 10/09/2013  . Bone lesion 10/04/2013  . Mammogram abnormal 10/04/2013  . Abdominal pain, unspecified site 10/04/2013  . Unspecified essential hypertension 06/10/2013  . Chest pain, unspecified 06/10/2013  . Chronic throat pain 06/10/2013  . Back pain 11/14/2011  . Obesity (BMI 30-39.9) 11/14/2011  . GERD (gastroesophageal reflux disease) 09/19/2011  . Calculus of gallbladder with acute and chronic cholecystitis without obstruction, s/p lap chole 27Dec2012 09/19/2011  . Hemorrhoids, internal, with bleeding 09/19/2011  . ANEMIA-IRON DEFICIENCY 03/19/2009  . CHEST PAIN, ATYPICAL 03/19/2009    Dorene Ar , PTA  01/29/2016, 11:59 AM  [place], [place], [address] Phone: [phone]   Fax:  [phone]  Name: Susan Lopez MRN: [id number] Date of Birth: 1961/09/21

## 2016-02-08 ENCOUNTER — Ambulatory Visit: Payer: No Typology Code available for payment source | Attending: Orthopaedic Surgery

## 2016-02-08 DIAGNOSIS — R262 Difficulty in walking, not elsewhere classified: Secondary | ICD-10-CM | POA: Insufficient documentation

## 2016-02-08 DIAGNOSIS — R600 Localized edema: Secondary | ICD-10-CM | POA: Insufficient documentation

## 2016-02-08 DIAGNOSIS — Z8781 Personal history of (healed) traumatic fracture: Secondary | ICD-10-CM | POA: Insufficient documentation

## 2016-02-08 DIAGNOSIS — M25572 Pain in left ankle and joints of left foot: Secondary | ICD-10-CM | POA: Insufficient documentation

## 2016-02-08 DIAGNOSIS — H8110 Benign paroxysmal vertigo, unspecified ear: Secondary | ICD-10-CM | POA: Insufficient documentation

## 2016-02-08 DIAGNOSIS — M6281 Muscle weakness (generalized): Secondary | ICD-10-CM | POA: Insufficient documentation

## 2016-02-08 DIAGNOSIS — Z967 Presence of other bone and tendon implants: Secondary | ICD-10-CM | POA: Insufficient documentation

## 2016-02-08 NOTE — Addendum Note (Signed)
Addended by: Dollene Cleveland on: 02/08/2016 03:38 PM   Modules accepted: Orders

## 2016-02-08 NOTE — Patient Instructions (Signed)
20 x each, 2-3 times a day.   Inversion: Resisted   Cross legs with right leg underneath, foot in tubing loop. Hold tubing around other foot to resist and turn foot in. Repeat ____ times per set. Do ____ sets per session. Do ____ sessions per day.  http://orth.exer.us/12   Copyright  VHI. All rights reserved.  Eversion: Resisted   With right foot in tubing loop, hold tubing around other foot to resist and turn foot out. Repeat ____ times per set. Do ____ sets per session. Do ____ sessions per day.  http://orth.exer.us/14   Copyright  VHI. All rights reserved.  Plantar Flexion: Resisted   Anchor behind, tubing around left foot, press down. Repeat ____ times per set. Do ____ sets per session. Do ____ sessions per day.  http://orth.exer.us/10   Copyright  VHI. All rights reserved.  Dorsiflexion: Resisted   Facing anchor, tubing around left foot, pull toward face.  Repeat ____ times per set. Do ____ sets per session. Do ____ sessions per day.  http://orth.exer.us/8   Copyright  VHI. All rights reserved.

## 2016-02-08 NOTE — Therapy (Signed)
[place], [place], [address] Phone: [phone]   Fax:  [phone]  Physical Therapy Treatment  Patient Details  Name: Susan Lopez MRN: [id number] Date of Birth: [birth year] Referring Provider: Frankey Shown  Encounter Date: 02/08/2016      PT End of Session - 02/08/16 0945    Visit Number 8   Number of Visits 16   Date for PT Re-Evaluation 03/02/16   PT Start Time 0900  pt was 15 mins late, 10 mins on ice, so only 2 units billed   PT Stop Time 0940   PT Time Calculation (min) 40 min   Activity Tolerance Patient tolerated treatment well;No increased pain   Behavior During Therapy [facility] for tasks assessed/performed      Past Medical History  Diagnosis Date  . Iron deficiency anemia   . Hypertension   . Arthritis   . Gallstones   . Hemorrhoids, external 10-21-11    occ. bothersome  . Calculus of gallbladder with acute and chronic cholecystitis without obstruction, s/p lap chole 27Dec2012 09/19/2011  . Bone lesion 10/04/2013  . Mammogram abnormal 10/04/2013  . Complication of anesthesia   . Dysrhythmia     "fast time"  . Depression   . Anxiety     Past Surgical History  Procedure Laterality Date  . Cesarean section  1985, 1992  . Cholecystectomy  10/27/2011    Procedure: LAPAROSCOPIC CHOLECYSTECTOMY WITH INTRAOPERATIVE CHOLANGIOGRAM;  Surgeon: Adin Hector, MD;  Location: [facility];  Service: General;  Laterality: N/A;  Laparoscopic Chole w/ IOC Single Site  . Eye surgery  1`12-21-10    bil. for tissue growth-laser surgery  . Orif ankle fracture Left 11/13/2015    Procedure: OPEN REDUCTION INTERNAL FIXATION (ORIF) LEFT BIMALLEOLAR ANKLE FRACTURE;  Surgeon: Leandrew Koyanagi, MD;  Location: [place];  Service: Orthopedics;  Laterality: Left;    There were no vitals filed for this visit.      Subjective Assessment - 02/08/16 0901    Subjective Getting better. Walking with 1 crutch.   Currently in Pain? Yes   Pain Score 4    Pain Location Ankle   Pain Orientation Left   Pain Descriptors / Indicators Aching            [facility] PT Assessment - 02/08/16 0001    Circumferential Edema   Circumferential - Left  25 cm  26 cm on eval    ROM / Strength   AROM / PROM / Strength Strength   AROM   Left Ankle Dorsiflexion 8   Left Ankle Plantar Flexion 40   Left Ankle Inversion 49   Left Ankle Eversion 9   Strength   Strength Assessment Site Hip;Knee;Ankle   Right/Left Hip Left   Left Hip Flexion 3+/5   Right/Left Knee Left   Left Knee Flexion 3+/5   Left Knee Extension 3+/5   Right/Left Ankle Left   Left Ankle Dorsiflexion 3+/5   Left Ankle Plantar Flexion 2+/5   Left Ankle Inversion 3+/5   Left Ankle Eversion 3+/5                     [facility] Adult PT Treatment/Exercise - 02/08/16 0001    Cryotherapy   Number Minutes Cryotherapy 10 Minutes   Cryotherapy Location Ankle   Type of Cryotherapy Ice pack   Manual Therapy   Soft tissue mobilization STM to ant tib, calf, ankle, foot without tool   Ankle Exercises: Seated  Heel Raises 20 reps   Toe Raise 20 reps   Ankle Exercises: Supine   T-Band performed seated x 20 each yellow added to HEP                PT Education - 02/08/16 0949    Education provided Yes   Education Details yellow band ankel 4 way re-prescribed, reviewed.    Person(s) Educated Patient   Methods Explanation;Demonstration;Handout   Comprehension Verbalized understanding;Need further instruction          PT Short Term Goals - 02/08/16 0909    PT SHORT TERM GOAL #1   Title Pt will be I with initial HEP for continued strengthening and mobility by 02/06/16.   Time 4   Period Weeks   Status Achieved   PT SHORT TERM GOAL #2   Title L ankle DF AROM will improve to 5 degrees pain-free in order to stand for 10 mins without pain.    Time 4   Period Weeks   Status Achieved   PT SHORT TERM GOAL #3   Title Pt will  demonstrate proper TTWB with RW for all gait 100 feet, indep by 02/06/16.     Time 4   Period Weeks   Status Achieved   PT SHORT TERM GOAL #4   Title Pt will no longer complain of dizziness with sit to supine transfers by 02/05/16.   Time 4   Period Weeks   Status Achieved           PT Long Term Goals - 02/08/16 0920    PT LONG TERM GOAL #1   Title L ankle PF strength will improve to 3+/5 in order to perform stairs pain free by  03/02/16 .    Time 8   Period Weeks   Status On-going   PT LONG TERM GOAL #2   Title L ankle DF ROM will improve to 15 degrees in order for  pt to walk for 30 mins pain- free with SPC by 03/02/16.   Time 8   Period Weeks   Status On-going   PT LONG TERM GOAL #3   Title Pt will tolerate SLS on L LE ( once FWB by MD)  for 30 secs to improve functional mobility tolerance and decrease risk for falls by 03/02/16.    Time 8   Period Weeks   Status On-going   PT LONG TERM GOAL #4   Title L ankle malleoli circumference will improve to 24cm by 03/02/16.   Time 8   Period Weeks   Status On-going               Plan - 02/08/16 0946    Clinical Impression Statement Good improvements in L ankle AROM and strength as documented in objective measures. Less pain overall, but is still limited with WB and use of 1 crutch. pt c/o of increased pain in L knee at rest and L hip with standing and walking. Assessed knee and hip strength and weakness noted in both.  All STGs achieved and all  LTGs continue to remain appropriate.    PT Next Visit Plan assess tolerance to gait with 1 crutch vs 2, review band exercises, weight shifts    PT Home Exercise Plan Ankle AROM , ankle isometrics, ankle theraband,    Consulted and Agree with Plan of Care Patient      Patient will benefit from skilled therapeutic intervention in order to improve the following deficits and impairments:  Abnormal gait, Decreased activity tolerance, Decreased balance, Decreased mobility, Decreased strength,  Increased edema, Decreased knowledge of use of DME, Decreased endurance, Decreased safety awareness, Dizziness, Increased muscle spasms, Difficulty walking, Decreased range of motion, Pain, Impaired perceived functional ability, Impaired flexibility  Visit Diagnosis: Difficulty in walking  Left ankle pain  Muscle weakness (generalized)     Problem List Patient Active Problem List   Diagnosis Date Noted  . Bimalleolar fracture of left ankle 11/13/2015  . S/P ORIF (open reduction internal fixation) fracture 11/13/2015  . Healthcare maintenance 10/08/2015  . Postmenopausal bleeding 10/08/2015  . Pain in joint, ankle and foot 10/08/2015  . Headache disorder 10/08/2015  . Atypical chest pain 10/08/2015  . Essential hypertension 10/08/2015  . Neck pain on right side 08/12/2015  . Grief reaction 03/09/2015  . Midline thoracic back pain 12/11/2014  . Depression (emotion) 08/12/2014  . Dyslipidemia 08/12/2014  . Headache(784.0) 07/17/2014  . UTI (urinary tract infection) 04/16/2014  . Pap smear for cervical cancer screening 04/16/2014  . Cystocele with rectocele 04/16/2014  . Hyperlipidemia LDL goal < 100 01/22/2014  . Breast lump on left side at 12 o'clock position 10/22/2013  . Chronic constipation 10/09/2013  . Bone lesion 10/04/2013  . Mammogram abnormal 10/04/2013  . Abdominal pain, unspecified site 10/04/2013  . Unspecified essential hypertension 06/10/2013  . Chest pain, unspecified 06/10/2013  . Chronic throat pain 06/10/2013  . Back pain 11/14/2011  . Obesity (BMI 30-39.9) 11/14/2011  . GERD (gastroesophageal reflux disease) 09/19/2011  . Calculus of gallbladder with acute and chronic cholecystitis without obstruction, s/p lap chole 27Dec2012 09/19/2011  . Hemorrhoids, internal, with bleeding 09/19/2011  . ANEMIA-IRON DEFICIENCY 03/19/2009  . CHEST PAIN, ATYPICAL 03/19/2009    Dollene Cleveland, PT 02/08/2016, 9:58 AM  [facility] [address] [place], [place], [address] Phone: [phone]   Fax:  [phone]  Name: Juwana Radziewicz MRN: [id number] Date of Birth: 04-Oct-1961

## 2016-02-08 NOTE — Addendum Note (Signed)
Addended by: Dollene Cleveland on: 02/08/2016 01:16 PM   Modules accepted: Orders

## 2016-02-11 ENCOUNTER — Ambulatory Visit: Payer: No Typology Code available for payment source

## 2016-02-11 DIAGNOSIS — M25572 Pain in left ankle and joints of left foot: Secondary | ICD-10-CM

## 2016-02-11 DIAGNOSIS — R262 Difficulty in walking, not elsewhere classified: Secondary | ICD-10-CM

## 2016-02-11 DIAGNOSIS — M6281 Muscle weakness (generalized): Secondary | ICD-10-CM
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
## 2016-02-15 ENCOUNTER — Encounter: Payer: Self-pay | Admitting: Physical Therapy

## 2016-02-15 ENCOUNTER — Ambulatory Visit: Payer: No Typology Code available for payment source | Admitting: Physical Therapy

## 2016-02-15 DIAGNOSIS — R262 Difficulty in walking, not elsewhere classified: Secondary | ICD-10-CM

## 2016-02-15 DIAGNOSIS — R600 Localized edema: Secondary | ICD-10-CM

## 2016-02-15 DIAGNOSIS — M6281 Muscle weakness (generalized): Secondary | ICD-10-CM

## 2016-02-15 DIAGNOSIS — M25572 Pain in left ankle and joints of left foot: Secondary | ICD-10-CM

## 2016-02-15 DIAGNOSIS — Z9889 Other specified postprocedural states: Secondary | ICD-10-CM

## 2016-02-15 DIAGNOSIS — H8110 Benign paroxysmal vertigo, unspecified ear: Secondary | ICD-10-CM

## 2016-02-15 DIAGNOSIS — Z8781 Personal history of (healed) traumatic fracture: Secondary | ICD-10-CM

## 2016-02-15 NOTE — Therapy (Signed)
[place], [place], [address] Phone: [phone]   Fax:  [phone]  Physical Therapy Treatment  Patient Details  Name: Susan Lopez MRN: [id number] Date of Birth: January 08, 1961 Referring Provider: Frankey Shown  Encounter Date: 02/15/2016      PT End of Session - 02/15/16 1103    Visit Number 10   Number of Visits 16   Date for PT Re-Evaluation 03/02/16   PT Start Time 1020  PT ran over with prior patient, interpreter had to leave at 11:00 for another apt   PT Stop Time 1110   PT Time Calculation (min) 50 min   Activity Tolerance Patient tolerated treatment well;No increased pain   Behavior During Therapy [facility] for tasks assessed/performed      Past Medical History  Diagnosis Date  . Iron deficiency anemia   . Hypertension   . Arthritis   . Gallstones   . Hemorrhoids, external 10-21-11    occ. bothersome  . Calculus of gallbladder with acute and chronic cholecystitis without obstruction, s/p lap chole 27Dec2012 09/19/2011  . Bone lesion 10/04/2013  . Mammogram abnormal 10/04/2013  . Complication of anesthesia   . Dysrhythmia     "fast time"  . Depression   . Anxiety     Past Surgical History  Procedure Laterality Date  . Cesarean section  1985, 1992  . Cholecystectomy  10/27/2011    Procedure: LAPAROSCOPIC CHOLECYSTECTOMY WITH INTRAOPERATIVE CHOLANGIOGRAM;  Surgeon: Adin Hector, MD;  Location: [facility];  Service: General;  Laterality: N/A;  Laparoscopic Chole w/ IOC Single Site  . Eye surgery  1`12-21-10    bil. for tissue growth-laser surgery  . Orif ankle fracture Left 11/13/2015    Procedure: OPEN REDUCTION INTERNAL FIXATION (ORIF) LEFT BIMALLEOLAR ANKLE FRACTURE;  Surgeon: Leandrew Koyanagi, MD;  Location: [place];  Service: Orthopedics;  Laterality: Left;    There were no vitals filed for this visit.      Subjective Assessment - 02/15/16 1027    Subjective Pt reports she is alittle  extra swollen due to lack of rest being at church yesterday   Patient is accompained by: Interpreter   Currently in Pain? Yes   Pain Score 4    Pain Location Ankle   Pain Orientation Right;Left   Pain Descriptors / Indicators Aching;[facility] Adult PT Treatment/Exercise - 02/15/16 0001    Ambulation/Gait   Pre-Gait Activities A/P weight shift x15 ea  cues for DF and PF   Cryotherapy   Number Minutes Cryotherapy 10 Minutes   Cryotherapy Location Ankle   Type of Cryotherapy Ice pack   Manual Therapy   Soft tissue mobilization scar mobilization   Ankle Exercises: Supine   T-Band performed seated x 20 each yellow added to HEP   Other Supine Ankle Exercises SLR with ankle pumps 10x 5 DF/PF   Other Supine Ankle Exercises supine ABCs x2 with LE elevated   Ankle Exercises: Seated   Towel Crunch Other (comment)  scrunch pillow case   Heel Raises 20 reps   Toe Raise 20 reps   Other Seated Ankle Exercises winshield wipers on towel x30   Ankle Exercises: Standing   BAPS Level 2   Ankle Exercises: Stretches   Other Stretch seated hamstring stretch with gastroc stretch using strap  2x30s  PT Education - 02/15/16 1102    Education provided Yes   Education Details importance of ice to control swelling, shoe wear for ankle/foot support   Person(s) Educated Patient   Methods Explanation;Demonstration;Tactile cues;Verbal cues   Comprehension Verbalized understanding;Returned demonstration          PT Short Term Goals - 02/08/16 0909    PT SHORT TERM GOAL #1   Title Pt will be I with initial HEP for continued strengthening and mobility by 02/06/16.   Time 4   Period Weeks   Status Achieved   PT SHORT TERM GOAL #2   Title L ankle DF AROM will improve to 5 degrees pain-free in order to stand for 10 mins without pain.    Time 4   Period Weeks   Status Achieved   PT SHORT TERM GOAL #3   Title Pt will demonstrate proper  TTWB with RW for all gait 100 feet, indep by 02/06/16.     Time 4   Period Weeks   Status Achieved   PT SHORT TERM GOAL #4   Title Pt will no longer complain of dizziness with sit to supine transfers by 02/05/16.   Time 4   Period Weeks   Status Achieved           PT Long Term Goals - 02/08/16 0920    PT LONG TERM GOAL #1   Title L ankle PF strength will improve to 3+/5 in order to perform stairs pain free by  03/02/16 .    Time 8   Period Weeks   Status On-going   PT LONG TERM GOAL #2   Title L ankle DF ROM will improve to 15 degrees in order for  pt to walk for 30 mins pain- free with SPC by 03/02/16.   Time 8   Period Weeks   Status On-going   PT LONG TERM GOAL #3   Title Pt will tolerate SLS on L LE ( once FWB by MD)  for 30 secs to improve functional mobility tolerance and decrease risk for falls by 03/02/16.    Time 8   Period Weeks   Status On-going   PT LONG TERM GOAL #4   Title L ankle malleoli circumference will improve to 24cm by 03/02/16.   Time 8   Period Weeks   Status On-going               Plan - 02/15/16 1107    Clinical Impression Statement Swelling noted at lateral malleolus but demonstrates improving ROM. Able to perform supine DF with LE extension to neutral.    Rehab Potential Good   PT Next Visit Plan standing gait sequencing    PT Home Exercise Plan Ankle AROM , ankle isometrics, ankle theraband,       Patient will benefit from skilled therapeutic intervention in order to improve the following deficits and impairments:  Abnormal gait, Decreased activity tolerance, Decreased balance, Decreased mobility, Decreased strength, Increased edema, Decreased knowledge of use of DME, Decreased endurance, Decreased safety awareness, Dizziness, Increased muscle spasms, Difficulty walking, Decreased range of motion, Pain, Impaired perceived functional ability, Impaired flexibility  Visit Diagnosis: Difficulty in walking  Left ankle pain  Muscle weakness  (generalized)  Status post open reduction with internal fixation (ORIF) of fracture of ankle  Localized edema  BPPV (benign paroxysmal positional vertigo), unspecified laterality     Problem List Patient Active Problem List   Diagnosis Date Noted  . Bimalleolar fracture of left  ankle 11/13/2015  . S/P ORIF (open reduction internal fixation) fracture 11/13/2015  . Healthcare maintenance 10/08/2015  . Postmenopausal bleeding 10/08/2015  . Pain in joint, ankle and foot 10/08/2015  . Headache disorder 10/08/2015  . Atypical chest pain 10/08/2015  . Essential hypertension 10/08/2015  . Neck pain on right side 08/12/2015  . Grief reaction 03/09/2015  . Midline thoracic back pain 12/11/2014  . Depression (emotion) 08/12/2014  . Dyslipidemia 08/12/2014  . Headache(784.0) 07/17/2014  . UTI (urinary tract infection) 04/16/2014  . Pap smear for cervical cancer screening 04/16/2014  . Cystocele with rectocele 04/16/2014  . Hyperlipidemia LDL goal < 100 01/22/2014  . Breast lump on left side at 12 o'clock position 10/22/2013  . Chronic constipation 10/09/2013  . Bone lesion 10/04/2013  . Mammogram abnormal 10/04/2013  . Abdominal pain, unspecified site 10/04/2013  . Unspecified essential hypertension 06/10/2013  . Chest pain, unspecified 06/10/2013  . Chronic throat pain 06/10/2013  . Back pain 11/14/2011  . Obesity (BMI 30-39.9) 11/14/2011  . GERD (gastroesophageal reflux disease) 09/19/2011  . Calculus of gallbladder with acute and chronic cholecystitis without obstruction, s/p lap chole 27Dec2012 09/19/2011  . Hemorrhoids, internal, with bleeding 09/19/2011  . ANEMIA-IRON DEFICIENCY 03/19/2009  . CHEST PAIN, ATYPICAL 03/19/2009   Klair Leising C. Aniken Monestime PT, DPT 02/15/2016 11:16 AM   [place] [facility] [address] [place], [place], [address] Phone: [phone]   Fax:  [phone]  Name: Susan Lopez MRN:  [id number] Date of Birth: 21-Sep-1961

## 2016-02-19 ENCOUNTER — Ambulatory Visit: Payer: No Typology Code available for payment source | Admitting: Physical Therapy

## 2016-02-19 ENCOUNTER — Encounter: Payer: Self-pay | Admitting: Physical Therapy

## 2016-02-19 DIAGNOSIS — R262 Difficulty in walking, not elsewhere classified: Secondary | ICD-10-CM

## 2016-02-19 DIAGNOSIS — M6281 Muscle weakness (generalized): Secondary | ICD-10-CM

## 2016-02-19 DIAGNOSIS — M25572 Pain in left ankle and joints of left foot: Secondary | ICD-10-CM

## 2016-02-19 NOTE — Therapy (Signed)
[place], [place], [address] Phone: [phone]   Fax:  [phone]  Physical Therapy Treatment  Patient Details  Name: Susan Lopez MRN: [id number] Date of Birth: 09/30/1961 Referring Provider: Frankey Shown  Encounter Date: 02/19/2016      PT End of Session - 02/19/16 1037    Visit Number 11   Number of Visits 16   Date for PT Re-Evaluation 03/02/16   PT Start Time 1017   PT Stop Time 1110   PT Time Calculation (min) 53 min   Activity Tolerance Patient tolerated treatment well;No increased pain   Behavior During Therapy [facility] for tasks assessed/performed      Past Medical History  Diagnosis Date  . Iron deficiency anemia   . Hypertension   . Arthritis   . Gallstones   . Hemorrhoids, external 10-21-11    occ. bothersome  . Calculus of gallbladder with acute and chronic cholecystitis without obstruction, s/p lap chole 27Dec2012 09/19/2011  . Bone lesion 10/04/2013  . Mammogram abnormal 10/04/2013  . Complication of anesthesia   . Dysrhythmia     "fast time"  . Depression   . Anxiety     Past Surgical History  Procedure Laterality Date  . Cesarean section  1985, 1992  . Cholecystectomy  10/27/2011    Procedure: LAPAROSCOPIC CHOLECYSTECTOMY WITH INTRAOPERATIVE CHOLANGIOGRAM;  Surgeon: Adin Hector, MD;  Location: [facility];  Service: General;  Laterality: N/A;  Laparoscopic Chole w/ IOC Single Site  . Eye surgery  1`12-21-10    bil. for tissue growth-laser surgery  . Orif ankle fracture Left 11/13/2015    Procedure: OPEN REDUCTION INTERNAL FIXATION (ORIF) LEFT BIMALLEOLAR ANKLE FRACTURE;  Surgeon: Leandrew Koyanagi, MD;  Location: [place];  Service: Orthopedics;  Laterality: Left;    There were no vitals filed for this visit.      Subjective Assessment - 02/19/16 1020    Subjective Patient reports she is haivng less pain today. Had her leg elevated with ice for 4 hours yesterday   Patient  is accompained by: Interpreter   Currently in Pain? Yes   Pain Score 3    Pain Location Ankle   Pain Orientation Left   Pain Descriptors / Indicators Sore                         [facility] Adult PT Treatment/Exercise - 02/19/16 0001    Ambulation/Gait   Gait Comments trained weight shift A/P, gait cues for heel toe with crutch   Exercises   Exercises Knee/Hip   Knee/Hip Exercises: Stretches   Passive Hamstring Stretch 30 seconds   Knee/Hip Exercises: Supine   Other Supine Knee/Hip Exercises supine clam red TB x20   Cryotherapy   Number Minutes Cryotherapy 10 Minutes   Cryotherapy Location Ankle   Type of Cryotherapy Ice pack   Manual Therapy   Manual therapy comments PROM into DF/PF, INV, EV, and toe flex/ext 20 x each direction.    Soft tissue mobilization scar mobilization   Ankle Exercises: Supine   T-Band performed seated x 20 each yellow added to HEP   Other Supine Ankle Exercises supine ABCs x2 with LE elevated   Ankle Exercises: Seated   Towel Crunch Other (comment)  scrunch pillow case   Heel Raises 20 reps   Toe Raise 20 reps   Other Seated Ankle Exercises winshield wipers on towel x30  PT Education - 02/19/16 1037    Education provided Yes   Education Details exercise form and rationale, stretching when cramping instead of shortening muscle   Person(s) Educated Patient   Methods Explanation;Verbal cues;Tactile cues   Comprehension Verbalized understanding;Returned demonstration          PT Short Term Goals - 02/08/16 0909    PT SHORT TERM GOAL #1   Title Pt will be I with initial HEP for continued strengthening and mobility by 02/06/16.   Time 4   Period Weeks   Status Achieved   PT SHORT TERM GOAL #2   Title L ankle DF AROM will improve to 5 degrees pain-free in order to stand for 10 mins without pain.    Time 4   Period Weeks   Status Achieved   PT SHORT TERM GOAL #3   Title Pt will demonstrate proper TTWB with RW  for all gait 100 feet, indep by 02/06/16.     Time 4   Period Weeks   Status Achieved   PT SHORT TERM GOAL #4   Title Pt will no longer complain of dizziness with sit to supine transfers by 02/05/16.   Time 4   Period Weeks   Status Achieved           PT Long Term Goals - 02/08/16 0920    PT LONG TERM GOAL #1   Title L ankle PF strength will improve to 3+/5 in order to perform stairs pain free by  03/02/16 .    Time 8   Period Weeks   Status On-going   PT LONG TERM GOAL #2   Title L ankle DF ROM will improve to 15 degrees in order for  pt to walk for 30 mins pain- free with SPC by 03/02/16.   Time 8   Period Weeks   Status On-going   PT LONG TERM GOAL #3   Title Pt will tolerate SLS on L LE ( once FWB by MD)  for 30 secs to improve functional mobility tolerance and decrease risk for falls by 03/02/16.    Time 8   Period Weeks   Status On-going   PT LONG TERM GOAL #4   Title L ankle malleoli circumference will improve to 24cm by 03/02/16.   Time 8   Period Weeks   Status On-going               Plan - 02/19/16 1102    Clinical Impression Statement Patient noted to have good motion when moved passively but cont to have difficulty with active eversion. Mild pain noted in anterior tibialis during ABCs. Added supine clamshells due to complaint of L SIJ pain which was explained to the patient it was a result of ambulating with single crutch and L hip becoming weak as ankle heals.      Patient will benefit from skilled therapeutic intervention in order to improve the following deficits and impairments:  Abnormal gait, Decreased activity tolerance, Decreased balance, Decreased mobility, Decreased strength, Increased edema, Decreased knowledge of use of DME, Decreased endurance, Decreased safety awareness, Dizziness, Increased muscle spasms, Difficulty walking, Decreased range of motion, Pain, Impaired perceived functional ability, Impaired flexibility  Visit Diagnosis: Difficulty in  walking  Left ankle pain  Muscle weakness (generalized)     Problem List Patient Active Problem List   Diagnosis Date Noted  . Bimalleolar fracture of left ankle 11/13/2015  . S/P ORIF (open reduction internal fixation) fracture 11/13/2015  . Healthcare  maintenance 10/08/2015  . Postmenopausal bleeding 10/08/2015  . Pain in joint, ankle and foot 10/08/2015  . Headache disorder 10/08/2015  . Atypical chest pain 10/08/2015  . Essential hypertension 10/08/2015  . Neck pain on right side 08/12/2015  . Grief reaction 03/09/2015  . Midline thoracic back pain 12/11/2014  . Depression (emotion) 08/12/2014  . Dyslipidemia 08/12/2014  . Headache(784.0) 07/17/2014  . UTI (urinary tract infection) 04/16/2014  . Pap smear for cervical cancer screening 04/16/2014  . Cystocele with rectocele 04/16/2014  . Hyperlipidemia LDL goal < 100 01/22/2014  . Breast lump on left side at 12 o'clock position 10/22/2013  . Chronic constipation 10/09/2013  . Bone lesion 10/04/2013  . Mammogram abnormal 10/04/2013  . Abdominal pain, unspecified site 10/04/2013  . Unspecified essential hypertension 06/10/2013  . Chest pain, unspecified 06/10/2013  . Chronic throat pain 06/10/2013  . Back pain 11/14/2011  . Obesity (BMI 30-39.9) 11/14/2011  . GERD (gastroesophageal reflux disease) 09/19/2011  . Calculus of gallbladder with acute and chronic cholecystitis without obstruction, s/p lap chole 27Dec2012 09/19/2011  . Hemorrhoids, internal, with bleeding 09/19/2011  . ANEMIA-IRON DEFICIENCY 03/19/2009  . CHEST PAIN, ATYPICAL 03/19/2009    Huck Ashworth C. Velina Drollinger PT, DPT 02/19/2016 11:06 AM   [place] [facility] [address] [place], [place], [address] Phone: [phone]   Fax:  [phone]  Name: Brucha Erny MRN: [id number] Date of Birth: Apr 11, 1961

## 2016-02-22 ENCOUNTER — Ambulatory Visit: Payer: No Typology Code available for payment source | Admitting: Physical Therapy

## 2016-02-22 DIAGNOSIS — R262 Difficulty in walking, not elsewhere classified: Secondary | ICD-10-CM

## 2016-02-22 DIAGNOSIS — M25572 Pain in left ankle and joints of left foot: Secondary | ICD-10-CM

## 2016-02-22 DIAGNOSIS — M6281 Muscle weakness (generalized): Secondary | ICD-10-CM

## 2016-02-22 NOTE — Therapy (Signed)
[place], [place], [address] Phone: [phone]   Fax:  [phone]  Physical Therapy Treatment  Patient Details  Name: Susan Lopez MRN: [id number] Date of Birth: 1961-07-29 Referring Provider: Frankey Shown  Encounter Date: 02/22/2016      PT End of Session - 02/22/16 1353    Visit Number 12   Number of Visits 16   Date for PT Re-Evaluation 03/02/16   PT Start Time [id number]   PT Stop Time 1115   PT Time Calculation (min) 60 min      Past Medical History  Diagnosis Date  . Iron deficiency anemia   . Hypertension   . Arthritis   . Gallstones   . Hemorrhoids, external 10-21-11    occ. bothersome  . Calculus of gallbladder with acute and chronic cholecystitis without obstruction, s/p lap chole 27Dec2012 09/19/2011  . Bone lesion 10/04/2013  . Mammogram abnormal 10/04/2013  . Complication of anesthesia   . Dysrhythmia     "fast time"  . Depression   . Anxiety     Past Surgical History  Procedure Laterality Date  . Cesarean section  1985, 1992  . Cholecystectomy  10/27/2011    Procedure: LAPAROSCOPIC CHOLECYSTECTOMY WITH INTRAOPERATIVE CHOLANGIOGRAM;  Surgeon: Adin Hector, MD;  Location: [facility];  Service: General;  Laterality: N/A;  Laparoscopic Chole w/ IOC Single Site  . Eye surgery  1`12-21-10    bil. for tissue growth-laser surgery  . Orif ankle fracture Left 11/13/2015    Procedure: OPEN REDUCTION INTERNAL FIXATION (ORIF) LEFT BIMALLEOLAR ANKLE FRACTURE;  Surgeon: Leandrew Koyanagi, MD;  Location: [facility];  Service: Orthopedics;  Laterality: Left;    There were no vitals filed for this visit.      Subjective Assessment - 02/22/16 1133    Subjective Pt. rates pain in ankle as a 4/10 before tx. in ankle. Stated that she also has some pain around that tendon when she walks but stated that she is trying to walk heel-to-toe the way the PT told her.   Currently in Pain? Yes   Pain Score 4     Pain Location Ankle   Pain Orientation Left                         [facility] Adult PT Treatment/Exercise - 02/22/16 0001    Manual Therapy   Manual therapy comments PROM into DF/PF, INV, EV, and toe flex/ext 20 x each direction.    Ankle Exercises: Seated   Ankle Circles/Pumps AROM;Left;10 reps  2 sets.   Towel Crunch Other (comment)  scrunch pillow case   Towel Inversion/Eversion 2 reps  20 secs.   Heel Raises 15 reps  2 sets. added 1.5lbs. weight.   Toe Raise 15 reps  2 sets. added 1.5lbs. weight   BAPS Sitting;Level 2;10 reps  2 sets.   Other Seated Ankle Exercises winshield wipers on towel x30   Ankle Exercises: Standing   Heel Raises 10 reps  1 set. 5/10 pain.   Toe Raise 10 reps  2 sets.   Ankle Exercises: Supine   T-Band performed seated x 20 each yellow added to HEP   Other Supine Ankle Exercises SLR with ankle pumps 2x x 10  pain in lateral thigh when leg elevated.   Other Supine Ankle Exercises SLR's with ankle pumps.  2 sets. x 10 reps.  PT Short Term Goals - 02/08/16 0909    PT SHORT TERM GOAL #1   Title Pt will be I with initial HEP for continued strengthening and mobility by 02/06/16.   Time 4   Period Weeks   Status Achieved   PT SHORT TERM GOAL #2   Title L ankle DF AROM will improve to 5 degrees pain-free in order to stand for 10 mins without pain.    Time 4   Period Weeks   Status Achieved   PT SHORT TERM GOAL #3   Title Pt will demonstrate proper TTWB with RW for all gait 100 feet, indep by 02/06/16.     Time 4   Period Weeks   Status Achieved   PT SHORT TERM GOAL #4   Title Pt will no longer complain of dizziness with sit to supine transfers by 02/05/16.   Time 4   Period Weeks   Status Achieved           PT Long Term Goals - 02/22/16 1454    PT LONG TERM GOAL #1   Title L ankle PF strength will improve to 3+/5 in order to perform stairs pain free by  03/02/16 .    Time 8   Period Weeks   Status  On-going   PT LONG TERM GOAL #2   Title L ankle DF ROM will improve to 15 degrees in order for  pt to walk for 30 mins pain- free with SPC by 03/02/16.   Time 8   Period Weeks   Status On-going   PT LONG TERM GOAL #3   Title Pt will tolerate SLS on L LE ( once FWB by MD)  for 30 secs to improve functional mobility tolerance and decrease risk for falls by 03/02/16.    Time 8   Period Weeks   Status On-going   PT LONG TERM GOAL #4   Title L ankle malleoli circumference will improve to 24cm by 03/02/16.   Time 8   Period Weeks   Status On-going               Plan - 02/22/16 1348    Clinical Impression Statement Needed to keep hand by knee to keep it straight during exercises. Had limited eversion passively. VC's needed to keep toes flat during towel scrunches. Pt. c/o of pain in lateral. left thigh during SLR with ankle pumps. Pt. reported 3/10 pain at end of tx. in ankle and less than that in the lateral thigh. C/O pain while doing heel raises in standing so only 1 set completed. Pt continues to use single crutch with gait. Progressing toward strength goals.    PT Next Visit Plan Continue to focus on ROM and strength in ankle, particularly for eversion. Closed chain strength as tolerated      Patient will benefit from skilled therapeutic intervention in order to improve the following deficits and impairments:  Abnormal gait, Decreased activity tolerance, Decreased balance, Decreased mobility, Decreased strength, Increased edema, Decreased knowledge of use of DME, Decreased endurance, Decreased safety awareness, Dizziness, Increased muscle spasms, Difficulty walking, Decreased range of motion, Pain, Impaired perceived functional ability, Impaired flexibility  Visit Diagnosis: Difficulty in walking  Left ankle pain  Muscle weakness (generalized)     Problem List Patient Active Problem List   Diagnosis Date Noted  . Bimalleolar fracture of left ankle 11/13/2015  . S/P ORIF (open  reduction internal fixation) fracture 11/13/2015  . Healthcare maintenance 10/08/2015  . Postmenopausal  bleeding 10/08/2015  . Pain in joint, ankle and foot 10/08/2015  . Headache disorder 10/08/2015  . Atypical chest pain 10/08/2015  . Essential hypertension 10/08/2015  . Neck pain on right side 08/12/2015  . Grief reaction 03/09/2015  . Midline thoracic back pain 12/11/2014  . Depression (emotion) 08/12/2014  . Dyslipidemia 08/12/2014  . Headache(784.0) 07/17/2014  . UTI (urinary tract infection) 04/16/2014  . Pap smear for cervical cancer screening 04/16/2014  . Cystocele with rectocele 04/16/2014  . Hyperlipidemia LDL goal < 100 01/22/2014  . Breast lump on left side at 12 o'clock position 10/22/2013  . Chronic constipation 10/09/2013  . Bone lesion 10/04/2013  . Mammogram abnormal 10/04/2013  . Abdominal pain, unspecified site 10/04/2013  . Unspecified essential hypertension 06/10/2013  . Chest pain, unspecified 06/10/2013  . Chronic throat pain 06/10/2013  . Back pain 11/14/2011  . Obesity (BMI 30-39.9) 11/14/2011  . GERD (gastroesophageal reflux disease) 09/19/2011  . Calculus of gallbladder with acute and chronic cholecystitis without obstruction, s/p lap chole 27Dec2012 09/19/2011  . Hemorrhoids, internal, with bleeding 09/19/2011  . ANEMIA-IRON DEFICIENCY 03/19/2009  . CHEST PAIN, ATYPICAL 03/19/2009   Reva Bores, [address] [place], [place] 02/22/2016, 2:58 PM  [facility] [address] [place], [place], [address] Phone: [phone]   Fax:  [phone]  Name: Susan Lopez MRN: [id number] Date of Birth: 1961/09/01

## 2016-02-23 ENCOUNTER — Ambulatory Visit: Payer: No Typology Code available for payment source

## 2016-02-24 ENCOUNTER — Encounter: Payer: Self-pay | Admitting: Physical Therapy

## 2016-02-24 ENCOUNTER — Encounter: Payer: No Typology Code available for payment source | Admitting: Physical Therapy

## 2016-02-24 ENCOUNTER — Ambulatory Visit: Payer: No Typology Code available for payment source | Admitting: Physical Therapy

## 2016-02-24 DIAGNOSIS — M25572 Pain in left ankle and joints of left foot: Secondary | ICD-10-CM

## 2016-02-24 DIAGNOSIS — M6281 Muscle weakness (generalized): Secondary | ICD-10-CM

## 2016-02-24 DIAGNOSIS — R262 Difficulty in walking, not elsewhere classified: Secondary | ICD-10-CM

## 2016-02-24 NOTE — Therapy (Signed)
[place], [place], [address] Phone: [phone]   Fax:  [phone]  Physical Therapy Treatment  Patient Details  Name: Susan Lopez MRN: [id number] Date of Birth: 1961/09/01 Referring Provider: Frankey Shown  Encounter Date: 02/24/2016      PT End of Session - 02/24/16 1025    Visit Number 13   Number of Visits 16   Date for PT Re-Evaluation 03/02/16   PT Start Time 1018   PT Stop Time 1109   PT Time Calculation (min) 51 min   Activity Tolerance Patient tolerated treatment well;No increased pain   Behavior During Therapy [facility] for tasks assessed/performed      Past Medical History  Diagnosis Date  . Iron deficiency anemia   . Hypertension   . Arthritis   . Gallstones   . Hemorrhoids, external 10-21-11    occ. bothersome  . Calculus of gallbladder with acute and chronic cholecystitis without obstruction, s/p lap chole 27Dec2012 09/19/2011  . Bone lesion 10/04/2013  . Mammogram abnormal 10/04/2013  . Complication of anesthesia   . Dysrhythmia     "fast time"  . Depression   . Anxiety     Past Surgical History  Procedure Laterality Date  . Cesarean section  1985, 1992  . Cholecystectomy  10/27/2011    Procedure: LAPAROSCOPIC CHOLECYSTECTOMY WITH INTRAOPERATIVE CHOLANGIOGRAM;  Surgeon: Adin Hector, MD;  Location: [facility];  Service: General;  Laterality: N/A;  Laparoscopic Chole w/ IOC Single Site  . Eye surgery  1`12-21-10    bil. for tissue growth-laser surgery  . Orif ankle fracture Left 11/13/2015    Procedure: OPEN REDUCTION INTERNAL FIXATION (ORIF) LEFT BIMALLEOLAR ANKLE FRACTURE;  Surgeon: Leandrew Koyanagi, MD;  Location: [facility];  Service: Orthopedics;  Laterality: Left;    There were no vitals filed for this visit.      Subjective Assessment - 02/24/16 1022    Subjective Ankle is feeling a little bit better practicing to ambulate appropriately.    Patient is accompained by:  Interpreter   Currently in Pain? Yes   Pain Score 4    Pain Location Ankle   Pain Orientation Left   Pain Descriptors / Indicators Sharp                         [facility] Adult PT Treatment/Exercise - 02/24/16 0001    Cryotherapy   Number Minutes Cryotherapy 10 Minutes   Cryotherapy Location Ankle   Type of Cryotherapy Ice pack   Manual Therapy   Manual therapy comments eversion and DF stretching, fluid mobilization around lateral malleolus   Soft tissue mobilization scar mobilization   Ankle Exercises: Seated   Heel Raises Other (comment)  30   Toe Raise Other (comment)  30   BAPS Sitting;Level 2;10 reps  2 sets.   Other Seated Ankle Exercises winshield wipers on towel x30; toe yoga x30 ea   Ankle Exercises: Supine   T-Band supine with leg elevated YTB x10 ea DF and eversion   Other Supine Ankle Exercises SLR's with ankle pumps. 6x 5 ankle pumps   Ankle Exercises: Stretches   Other Stretch supine with green strap 2x30s   Ankle Exercises: Aerobic   Stationary Bike Nu Step 5 min L3                  PT Short Term Goals - 02/08/16 0909    PT SHORT TERM GOAL #1  Title Pt will be I with initial HEP for continued strengthening and mobility by 02/06/16.   Time 4   Period Weeks   Status Achieved   PT SHORT TERM GOAL #2   Title L ankle DF AROM will improve to 5 degrees pain-free in order to stand for 10 mins without pain.    Time 4   Period Weeks   Status Achieved   PT SHORT TERM GOAL #3   Title Pt will demonstrate proper TTWB with RW for all gait 100 feet, indep by 02/06/16.     Time 4   Period Weeks   Status Achieved   PT SHORT TERM GOAL #4   Title Pt will no longer complain of dizziness with sit to supine transfers by 02/05/16.   Time 4   Period Weeks   Status Achieved           PT Long Term Goals - 02/22/16 1454    PT LONG TERM GOAL #1   Title L ankle PF strength will improve to 3+/5 in order to perform stairs pain free by  03/02/16 .    Time  8   Period Weeks   Status On-going   PT LONG TERM GOAL #2   Title L ankle DF ROM will improve to 15 degrees in order for  pt to walk for 30 mins pain- free with SPC by 03/02/16.   Time 8   Period Weeks   Status On-going   PT LONG TERM GOAL #3   Title Pt will tolerate SLS on L LE ( once FWB by MD)  for 30 secs to improve functional mobility tolerance and decrease risk for falls by 03/02/16.    Time 8   Period Weeks   Status On-going   PT LONG TERM GOAL #4   Title L ankle malleoli circumference will improve to 24cm by 03/02/16.   Time 8   Period Weeks   Status On-going               Plan - 02/24/16 1135    Clinical Impression Statement Pt reports she is having the screw removed from her ankle on May 8 and reportedly was instructed by MD to remain on crutch until that time. Sometimes feels a pinching around lateral malleolus where swelling is notably concentrated. Dorsiflexion A/ROM to 2 deg and passively to 6 degrees, PF to 52 deg. Notable weakness in quads accompany c/o L knee pain. Will continue to benefit from strength and ROM improvements to return to normalized gait pattern.    PT Next Visit Plan Continue to focus on ROM and strength in ankle, particularly for eversion. Closed chain strength as tolerated   PT Home Exercise Plan Ankle AROM , ankle isometrics, ankle theraband,       Patient will benefit from skilled therapeutic intervention in order to improve the following deficits and impairments:  Abnormal gait, Decreased activity tolerance, Decreased balance, Decreased mobility, Decreased strength, Increased edema, Decreased knowledge of use of DME, Decreased endurance, Decreased safety awareness, Dizziness, Increased muscle spasms, Difficulty walking, Decreased range of motion, Pain, Impaired perceived functional ability, Impaired flexibility  Visit Diagnosis: Difficulty in walking  Muscle weakness (generalized)  Left ankle pain     Problem List Patient Active Problem  List   Diagnosis Date Noted  . Bimalleolar fracture of left ankle 11/13/2015  . S/P ORIF (open reduction internal fixation) fracture 11/13/2015  . Healthcare maintenance 10/08/2015  . Postmenopausal bleeding 10/08/2015  . Pain in joint,  ankle and foot 10/08/2015  . Headache disorder 10/08/2015  . Atypical chest pain 10/08/2015  . Essential hypertension 10/08/2015  . Neck pain on right side 08/12/2015  . Grief reaction 03/09/2015  . Midline thoracic back pain 12/11/2014  . Depression (emotion) 08/12/2014  . Dyslipidemia 08/12/2014  . Headache(784.0) 07/17/2014  . UTI (urinary tract infection) 04/16/2014  . Pap smear for cervical cancer screening 04/16/2014  . Cystocele with rectocele 04/16/2014  . Hyperlipidemia LDL goal < 100 01/22/2014  . Breast lump on left side at 12 o'clock position 10/22/2013  . Chronic constipation 10/09/2013  . Bone lesion 10/04/2013  . Mammogram abnormal 10/04/2013  . Abdominal pain, unspecified site 10/04/2013  . Unspecified essential hypertension 06/10/2013  . Chest pain, unspecified 06/10/2013  . Chronic throat pain 06/10/2013  . Back pain 11/14/2011  . Obesity (BMI 30-39.9) 11/14/2011  . GERD (gastroesophageal reflux disease) 09/19/2011  . Calculus of gallbladder with acute and chronic cholecystitis without obstruction, s/p lap chole 27Dec2012 09/19/2011  . Hemorrhoids, internal, with bleeding 09/19/2011  . ANEMIA-IRON DEFICIENCY 03/19/2009  . CHEST PAIN, ATYPICAL 03/19/2009    Lorielle Boehning C. Meade Hogeland PT, DPT 02/24/2016 11:40 AM   [facility] [address] [place], [place], [address] Phone: [phone]   Fax:  [phone]  Name: Susan Lopez MRN: [id number] Date of Birth: [birth year]

## 2016-02-29 ENCOUNTER — Ambulatory Visit: Payer: No Typology Code available for payment source | Attending: Orthopaedic Surgery | Admitting: Physical Therapy

## 2016-02-29 DIAGNOSIS — M6281 Muscle weakness (generalized): Secondary | ICD-10-CM | POA: Insufficient documentation

## 2016-02-29 DIAGNOSIS — Z9889 Other specified postprocedural states: Secondary | ICD-10-CM

## 2016-02-29 DIAGNOSIS — R600 Localized edema: Secondary | ICD-10-CM | POA: Insufficient documentation

## 2016-02-29 DIAGNOSIS — M25572 Pain in left ankle and joints of left foot: Secondary | ICD-10-CM | POA: Insufficient documentation

## 2016-02-29 DIAGNOSIS — R262 Difficulty in walking, not elsewhere classified: Secondary | ICD-10-CM | POA: Insufficient documentation

## 2016-02-29 DIAGNOSIS — Z8781 Personal history of (healed) traumatic fracture: Secondary | ICD-10-CM | POA: Insufficient documentation

## 2016-02-29 DIAGNOSIS — Z967 Presence of other bone and tendon implants: Secondary | ICD-10-CM | POA: Insufficient documentation

## 2016-02-29 NOTE — Therapy (Signed)
[place], [place], [address] Phone: [phone]   Fax:  [phone]  Physical Therapy Treatment  Patient Details  Name: Susan Lopez MRN: [id number] Date of Birth: 1961/03/27 Referring Provider: Frankey Shown  Encounter Date: 02/29/2016      PT End of Session - 02/29/16 1309    Visit Number 14   Number of Visits 16   Date for PT Re-Evaluation 03/02/16   PT Start Time [id number]   PT Stop Time 1115   PT Time Calculation (min) 60 min      Past Medical History  Diagnosis Date  . Iron deficiency anemia   . Hypertension   . Arthritis   . Gallstones   . Hemorrhoids, external 10-21-11    occ. bothersome  . Calculus of gallbladder with acute and chronic cholecystitis without obstruction, s/p lap chole 27Dec2012 09/19/2011  . Bone lesion 10/04/2013  . Mammogram abnormal 10/04/2013  . Complication of anesthesia   . Dysrhythmia     "fast time"  . Depression   . Anxiety     Past Surgical History  Procedure Laterality Date  . Cesarean section  1985, 1992  . Cholecystectomy  10/27/2011    Procedure: LAPAROSCOPIC CHOLECYSTECTOMY WITH INTRAOPERATIVE CHOLANGIOGRAM;  Surgeon: Adin Hector, MD;  Location: [facility];  Service: General;  Laterality: N/A;  Laparoscopic Chole w/ IOC Single Site  . Eye surgery  1`12-21-10    bil. for tissue growth-laser surgery  . Orif ankle fracture Left 11/13/2015    Procedure: OPEN REDUCTION INTERNAL FIXATION (ORIF) LEFT BIMALLEOLAR ANKLE FRACTURE;  Surgeon: Leandrew Koyanagi, MD;  Location: [facility];  Service: Orthopedics;  Laterality: Left;    There were no vitals filed for this visit.      Subjective Assessment - 02/29/16 1251    Subjective Pt. rated pain in left ankle as an 8/10 before tx. Stated that the pain is in both side of the ankle and that it is swollen. She also reports her left knee feels as if it will give away when walking.    Currently in Pain? Yes   Pain Score 8     Pain Location Ankle   Pain Orientation Left                         [facility] Adult PT Treatment/Exercise - 02/29/16 0001    Knee/Hip Exercises: Aerobic   Nustep L4 5 min.   Knee/Hip Exercises: Seated   Long Arc Quad AROM;Strengthening;Left;2 sets;10 reps   Long Arc Quad Weight 3 lbs.   Other Seated Knee/Hip Exercises T-band EV/IV  2 sets. x 10 reps., green   Sit to Sand 1 set;15 reps  Cues needed for four basics.   Cryotherapy   Number Minutes Cryotherapy 10 Minutes   Cryotherapy Location Ankle   Type of Cryotherapy Ice pack   Ankle Exercises: Seated   Towel Crunch 5 reps   Heel Raises 15 reps  2 sets.   Other Seated Ankle Exercises winshield wipers on towel x30; toe yoga x30 ea   Ankle Exercises: Stretches   Other Stretch supine with green strap 2x30s   Ankle Exercises: Standing   Heel Raises 10 reps  2 sets. C/O pain near rep. 6.   Ankle Exercises: Supine   Other Supine Ankle Exercises SLR with ankle pumps  2x x 10. pain in lateral thigh during raise.  PT Short Term Goals - 02/08/16 0909    PT SHORT TERM GOAL #1   Title Pt will be I with initial HEP for continued strengthening and mobility by 02/06/16.   Time 4   Period Weeks   Status Achieved   PT SHORT TERM GOAL #2   Title L ankle DF AROM will improve to 5 degrees pain-free in order to stand for 10 mins without pain.    Time 4   Period Weeks   Status Achieved   PT SHORT TERM GOAL #3   Title Pt will demonstrate proper TTWB with RW for all gait 100 feet, indep by 02/06/16.     Time 4   Period Weeks   Status Achieved   PT SHORT TERM GOAL #4   Title Pt will no longer complain of dizziness with sit to supine transfers by 02/05/16.   Time 4   Period Weeks   Status Achieved           PT Long Term Goals - 02/22/16 1454    PT LONG TERM GOAL #1   Title L ankle PF strength will improve to 3+/5 in order to perform stairs pain free by  03/02/16 .    Time 8   Period Weeks    Status On-going   PT LONG TERM GOAL #2   Title L ankle DF ROM will improve to 15 degrees in order for  pt to walk for 30 mins pain- free with SPC by 03/02/16.   Time 8   Period Weeks   Status On-going   PT LONG TERM GOAL #3   Title Pt will tolerate SLS on L LE ( once FWB by MD)  for 30 secs to improve functional mobility tolerance and decrease risk for falls by 03/02/16.    Time 8   Period Weeks   Status On-going   PT LONG TERM GOAL #4   Title L ankle malleoli circumference will improve to 24cm by 03/02/16.   Time 8   Period Weeks   Status On-going               Plan - 02/29/16 1259    Clinical Impression Statement Pt. c/o increased pain during standing calf raises around rep. 6. Had difficulty keeping four toes down and lift big toe during toe yoga. Pt. rated pain as a 5/10 after tx. Pt has surgery scheduled for Monday May 8th to remove hardware. Canceled her PT appt for that day and asked to to bring written orders to resume PT the Friday after. Pt verbalizes understanding.    PT Next Visit Plan One more visit prior to harware removal, ROM and functional exercises, manual as indicated; re-assess after harware removal      Patient will benefit from skilled therapeutic intervention in order to improve the following deficits and impairments:  Abnormal gait, Decreased activity tolerance, Decreased balance, Decreased mobility, Decreased strength, Increased edema, Decreased knowledge of use of DME, Decreased endurance, Decreased safety awareness, Dizziness, Increased muscle spasms, Difficulty walking, Decreased range of motion, Pain, Impaired perceived functional ability, Impaired flexibility  Visit Diagnosis: Difficulty in walking  Muscle weakness (generalized)  Left ankle pain  Status post open reduction with internal fixation (ORIF) of fracture of ankle  Localized edema     Problem List Patient Active Problem List   Diagnosis Date Noted  . Bimalleolar fracture of left  ankle 11/13/2015  . S/P ORIF (open reduction internal fixation) fracture 11/13/2015  . Healthcare maintenance 10/08/2015  .  Postmenopausal bleeding 10/08/2015  . Pain in joint, ankle and foot 10/08/2015  . Headache disorder 10/08/2015  . Atypical chest pain 10/08/2015  . Essential hypertension 10/08/2015  . Neck pain on right side 08/12/2015  . Grief reaction 03/09/2015  . Midline thoracic back pain 12/11/2014  . Depression (emotion) 08/12/2014  . Dyslipidemia 08/12/2014  . Headache(784.0) 07/17/2014  . UTI (urinary tract infection) 04/16/2014  . Pap smear for cervical cancer screening 04/16/2014  . Cystocele with rectocele 04/16/2014  . Hyperlipidemia LDL goal < 100 01/22/2014  . Breast lump on left side at 12 o'clock position 10/22/2013  . Chronic constipation 10/09/2013  . Bone lesion 10/04/2013  . Mammogram abnormal 10/04/2013  . Abdominal pain, unspecified site 10/04/2013  . Unspecified essential hypertension 06/10/2013  . Chest pain, unspecified 06/10/2013  . Chronic throat pain 06/10/2013  . Back pain 11/14/2011  . Obesity (BMI 30-39.9) 11/14/2011  . GERD (gastroesophageal reflux disease) 09/19/2011  . Calculus of gallbladder with acute and chronic cholecystitis without obstruction, s/p lap chole 27Dec2012 09/19/2011  . Hemorrhoids, internal, with bleeding 09/19/2011  . ANEMIA-IRON DEFICIENCY 03/19/2009  . CHEST PAIN, ATYPICAL 03/19/2009    Reva Bores, [address], [place] 02/29/2016, 2:02 PM  [facility] [address] [place], [place], [address] Phone: [phone]   Fax:  [phone]  Name: Montessa Garske MRN: [id number] Date of Birth: February 07, 1961

## 2016-03-03 ENCOUNTER — Ambulatory Visit: Payer: No Typology Code available for payment source | Admitting: Physical Therapy

## 2016-03-03 ENCOUNTER — Ambulatory Visit: Payer: Self-pay | Attending: Internal Medicine | Admitting: Internal Medicine

## 2016-03-03 ENCOUNTER — Encounter: Payer: Self-pay | Admitting: Internal Medicine

## 2016-03-03 VITALS — BP 114/64 | HR 91 | Temp 98.7°F | Resp 18 | Ht 60.0 in | Wt 174.0 lb

## 2016-03-03 DIAGNOSIS — F329 Major depressive disorder, single episode, unspecified: Secondary | ICD-10-CM | POA: Insufficient documentation

## 2016-03-03 DIAGNOSIS — K644 Residual hemorrhoidal skin tags: Secondary | ICD-10-CM | POA: Insufficient documentation

## 2016-03-03 DIAGNOSIS — I10 Essential (primary) hypertension: Secondary | ICD-10-CM | POA: Insufficient documentation

## 2016-03-03 DIAGNOSIS — Z79899 Other long term (current) drug therapy: Secondary | ICD-10-CM | POA: Insufficient documentation

## 2016-03-03 DIAGNOSIS — Z780 Asymptomatic menopausal state: Secondary | ICD-10-CM | POA: Insufficient documentation

## 2016-03-03 DIAGNOSIS — R109 Unspecified abdominal pain: Secondary | ICD-10-CM | POA: Insufficient documentation

## 2016-03-03 DIAGNOSIS — M1990 Unspecified osteoarthritis, unspecified site: Secondary | ICD-10-CM | POA: Insufficient documentation

## 2016-03-03 DIAGNOSIS — M6281 Muscle weakness (generalized): Secondary | ICD-10-CM

## 2016-03-03 DIAGNOSIS — R600 Localized edema: Secondary | ICD-10-CM

## 2016-03-03 DIAGNOSIS — M25579 Pain in unspecified ankle and joints of unspecified foot: Secondary | ICD-10-CM | POA: Insufficient documentation

## 2016-03-03 DIAGNOSIS — Z8719 Personal history of other diseases of the digestive system: Secondary | ICD-10-CM | POA: Insufficient documentation

## 2016-03-03 DIAGNOSIS — D509 Iron deficiency anemia, unspecified: Secondary | ICD-10-CM | POA: Insufficient documentation

## 2016-03-03 DIAGNOSIS — N950 Postmenopausal bleeding: Secondary | ICD-10-CM | POA: Insufficient documentation

## 2016-03-03 DIAGNOSIS — Z9889 Other specified postprocedural states: Secondary | ICD-10-CM | POA: Insufficient documentation

## 2016-03-03 DIAGNOSIS — Z7982 Long term (current) use of aspirin: Secondary | ICD-10-CM | POA: Insufficient documentation

## 2016-03-03 DIAGNOSIS — E785 Hyperlipidemia, unspecified: Secondary | ICD-10-CM | POA: Insufficient documentation

## 2016-03-03 DIAGNOSIS — R262 Difficulty in walking, not elsewhere classified: Secondary | ICD-10-CM

## 2016-03-03 DIAGNOSIS — M25572 Pain in left ankle and joints of left foot: Secondary | ICD-10-CM

## 2016-03-03 DIAGNOSIS — Z888 Allergy status to other drugs, medicaments and biological substances status: Secondary | ICD-10-CM | POA: Insufficient documentation

## 2016-03-03 DIAGNOSIS — F419 Anxiety disorder, unspecified: Secondary | ICD-10-CM | POA: Insufficient documentation

## 2016-03-03 MED ORDER — ATORVASTATIN CALCIUM 20 MG PO TABS: 20.0000 mg | ORAL_TABLET | Freq: Every day | ORAL | Status: DC

## 2016-03-03 MED ORDER — ACETAMINOPHEN-CODEINE #3 300-30 MG PO TABS: 1.0000 | ORAL_TABLET | ORAL | Status: DC | PRN

## 2016-03-03 MED ORDER — MELOXICAM 7.5 MG PO TABS: 7.5000 mg | ORAL_TABLET | Freq: Every day | ORAL | Status: DC

## 2016-03-03 MED FILL — ATORVASTATIN 20 MG TABLET: 20 | 30 days supply | Qty: 30 | Fill #5

## 2016-03-03 MED FILL — ACETAMINOPHEN/COD #3 TABLET: 300-30 | 10 days supply | Qty: 60 | Fill #0

## 2016-03-03 NOTE — Progress Notes (Signed)
Patient is here for Abdominal Pain  Patient complains of lower left stomach pain. Pain has been present for a few months. Pain is scaled currently at a 6. Patient states she received a biopsy after bleeding post menopausal 1 year and cyst were found. Pain has been present since.  Patient has not taken medication today. Patient has eaten today.

## 2016-03-03 NOTE — Therapy (Signed)
[place], [place], [address] Phone: [phone]   Fax:  [phone]  Physical Therapy Treatment  Patient Details  Name: Susan Lopez MRN: [id number] Date of Birth: May 28, 1961 Referring Provider: Frankey Shown  Encounter Date: 03/03/2016      PT End of Session - 03/03/16 1626    Visit Number 15   Number of Visits 16   Date for PT Re-Evaluation 03/02/16   PT Start Time 0801   PT Stop Time 0900   PT Time Calculation (min) 59 min      Past Medical History  Diagnosis Date  . Iron deficiency anemia   . Hypertension   . Arthritis   . Gallstones   . Hemorrhoids, external 10-21-11    occ. bothersome  . Calculus of gallbladder with acute and chronic cholecystitis without obstruction, s/p lap chole 27Dec2012 09/19/2011  . Bone lesion 10/04/2013  . Mammogram abnormal 10/04/2013  . Complication of anesthesia   . Dysrhythmia     "fast time"  . Depression   . Anxiety     Past Surgical History  Procedure Laterality Date  . Cesarean section  1985, 1992  . Cholecystectomy  10/27/2011    Procedure: LAPAROSCOPIC CHOLECYSTECTOMY WITH INTRAOPERATIVE CHOLANGIOGRAM;  Surgeon: Adin Hector, MD;  Location: [facility];  Service: General;  Laterality: N/A;  Laparoscopic Chole w/ IOC Single Site  . Eye surgery  1`12-21-10    bil. for tissue growth-laser surgery  . Orif ankle fracture Left 11/13/2015    Procedure: OPEN REDUCTION INTERNAL FIXATION (ORIF) LEFT BIMALLEOLAR ANKLE FRACTURE;  Surgeon: Leandrew Koyanagi, MD;  Location: [facility];  Service: Orthopedics;  Laterality: Left;    There were no vitals filed for this visit.      Subjective Assessment - 03/03/16 1614    Subjective No pain before tx.   Currently in Pain? No/denies                         [facility] Adult PT Treatment/Exercise - 03/03/16 0001    Knee/Hip Exercises: Aerobic   Nustep L5 60min.   Cryotherapy   Number Minutes Cryotherapy 15  Minutes   Cryotherapy Location Ankle   Type of Cryotherapy Ice pack   Ankle Exercises: Seated   Towel Inversion/Eversion 5 reps  2 sets.   Heel Raises 10 reps  2 sets. 3lbs.   BAPS Sitting;Level 2;10 reps  2 sets.   Other Seated Ankle Exercises one-legged calf raises.  2x x 10. left side.   Other Seated Ankle Exercises calf raises on baps.  2 sets. x 10.   Ankle Exercises: Standing   Heel Raises 10 reps  2 sets. C/O pain during second set.   Ankle Exercises: Stretches   Gastroc Stretch 2 reps;30 seconds  2 active sets. 2 passive sets.                  PT Short Term Goals - 02/08/16 0909    PT SHORT TERM GOAL #1   Title Pt will be I with initial HEP for continued strengthening and mobility by 02/06/16.   Time 4   Period Weeks   Status Achieved   PT SHORT TERM GOAL #2   Title L ankle DF AROM will improve to 5 degrees pain-free in order to stand for 10 mins without pain.    Time 4   Period Weeks   Status Achieved   PT SHORT TERM GOAL #3  Title Pt will demonstrate proper TTWB with RW for all gait 100 feet, indep by 02/06/16.     Time 4   Period Weeks   Status Achieved   PT SHORT TERM GOAL #4   Title Pt will no longer complain of dizziness with sit to supine transfers by 02/05/16.   Time 4   Period Weeks   Status Achieved           PT Long Term Goals - 03/03/16 1715    PT LONG TERM GOAL #1   Title L ankle PF strength will improve to 3+/5 in order to perform stairs pain free by  03/02/16 .    Baseline 2/5 PF   Time 8   Period Weeks   Status On-going   PT LONG TERM GOAL #2   Title L ankle DF ROM will improve to 15 degrees in order for  pt to walk for 30 mins pain- free with SPC by 03/02/16.   Time 8   Period Weeks   Status On-going   PT LONG TERM GOAL #3   Title Pt will tolerate SLS on L LE ( once FWB by MD)  for 30 secs to improve functional mobility tolerance and decrease risk for falls by 03/02/16.    Time 8   Period Weeks   Status Unable to assess   PT  LONG TERM GOAL #4   Title L ankle malleoli circumference will improve to 24cm by 03/02/16.   Time 8   Period Weeks   Status Unable to assess               Plan - 03/03/16 1621    Clinical Impression Statement Pt. still had pain with standing calf raises. Has surgery scheduled for monday, may 8th. to remove hardware. Was able to progress seated calf raises to baps board, as well as with 3lbs. weight. Pt. rated pain in ankle as a 3/10 after tx.   PT Next Visit Plan This was her last visit before her hardward removal. Re-assess after hardware removal.      Patient will benefit from skilled therapeutic intervention in order to improve the following deficits and impairments:  Abnormal gait, Decreased activity tolerance, Decreased balance, Decreased mobility, Decreased strength, Increased edema, Decreased knowledge of use of DME, Decreased endurance, Decreased safety awareness, Dizziness, Increased muscle spasms, Difficulty walking, Decreased range of motion, Pain, Impaired perceived functional ability, Impaired flexibility  Visit Diagnosis: Difficulty in walking  Muscle weakness (generalized)  Left ankle pain  Localized edema  During this treatment session, the therapist was present, participating in and directing the treatment.   Problem List Patient Active Problem List   Diagnosis Date Noted  . Bimalleolar fracture of left ankle 11/13/2015  . S/P ORIF (open reduction internal fixation) fracture 11/13/2015  . Healthcare maintenance 10/08/2015  . Postmenopausal bleeding 10/08/2015  . Pain in joint, ankle and foot 10/08/2015  . Headache disorder 10/08/2015  . Atypical chest pain 10/08/2015  . Essential hypertension 10/08/2015  . Neck pain on right side 08/12/2015  . Grief reaction 03/09/2015  . Midline thoracic back pain 12/11/2014  . Depression (emotion) 08/12/2014  . Dyslipidemia 08/12/2014  . Headache(784.0) 07/17/2014  . UTI (urinary tract infection) 04/16/2014  . Pap  smear for cervical cancer screening 04/16/2014  . Cystocele with rectocele 04/16/2014  . Hyperlipidemia LDL goal < 100 01/22/2014  . Breast lump on left side at 12 o'clock position 10/22/2013  . Chronic constipation 10/09/2013  . Bone lesion 10/04/2013  .  Mammogram abnormal 10/04/2013  . Abdominal pain, unspecified site 10/04/2013  . Unspecified essential hypertension 06/10/2013  . Chest pain, unspecified 06/10/2013  . Chronic throat pain 06/10/2013  . Back pain 11/14/2011  . Obesity (BMI 30-39.9) 11/14/2011  . GERD (gastroesophageal reflux disease) 09/19/2011  . Calculus of gallbladder with acute and chronic cholecystitis without obstruction, s/p lap chole 27Dec2012 09/19/2011  . Hemorrhoids, internal, with bleeding 09/19/2011  . ANEMIA-IRON DEFICIENCY 03/19/2009  . CHEST PAIN, ATYPICAL 03/19/2009   Tendler, Anabel Bene 03/03/2016, 5:17 PM  [place], [place], [address] Phone: [phone]   Fax:  [phone]  Name: Susan Lopez MRN: [id number] Date of Birth: [birth year]

## 2016-03-03 NOTE — Progress Notes (Signed)
Patient ID: Susan Lopez, female   DOB: 09-20-1961, 55 y.o.   MRN: [id number]   Susan Lopez, is a 55 y.o. female  [id number]  [id number]  DOB - 1961-07-02  Chief Complaint  Patient presents with  . Abdominal Pain        Subjective:   [name] is a 55 y.o. female with history of HTN, iron def anemia from chronic blood loss PV, major depression and anxiety here today for a follow up visit and medication refill. Patient complains of lower left abdominal pain. Pain has been present for a few months. Pain is scaled currently at a 6. Patient had endometrial biopsy after bleeding post menopausal x1 year and she was treated with cyst/polyps removal. However, since this procedure, she has had pain constantly in the lower abd/pelvic area and she still continues to bleed monthly after achieving menopause in the past. Patient has No headache, No chest pain, No Nausea, no vomiting, no diarrhea, no constipation, No new weakness tingling or numbness, No Cough - SOB.  No problems updated.  ALLERGIES: Allergies  Allergen Reactions  . Hydrocodone Nausea And Vomiting  . Tramadol Nausea Only  . Oxycodone Rash    PAST MEDICAL HISTORY: Past Medical History  Diagnosis Date  . Iron deficiency anemia   . Hypertension   . Arthritis   . Gallstones   . Hemorrhoids, external 10-21-11    occ. bothersome  . Calculus of gallbladder with acute and chronic cholecystitis without obstruction, s/p lap chole 27Dec2012 09/19/2011  . Bone lesion 10/04/2013  . Mammogram abnormal 10/04/2013  . Complication of anesthesia   . Dysrhythmia     "fast time"  . Depression   . Anxiety     MEDICATIONS AT HOME: Prior to Admission medications   Medication Sig Start Date End Date Taking? Authorizing Provider  aspirin EC 325 MG tablet Take 1 tablet (325 mg total) by mouth 2 (two) times daily. 11/13/15  Yes Naiping Ephriam Jenkins, MD  atorvastatin (LIPITOR) 20 MG tablet Take 1 tablet (20 mg  total) by mouth daily. 03/03/16  Yes Tresa Garter, MD  cyclobenzaprine (FLEXERIL) 10 MG tablet Take 1 tablet (10 mg total) by mouth 3 (three) times daily as needed for muscle spasms. 10/08/15  Yes Tresa Garter, MD  docusate sodium (COLACE) 100 MG capsule Take 1 capsule (100 mg total) by mouth 2 (two) times daily as needed for mild constipation. 10/08/15  Yes Tresa Garter, MD  gabapentin (NEURONTIN) 100 MG capsule Take 1 capsule (100 mg total) by mouth 2 (two) times daily. 03/09/15  Yes Deepak Advani, MD  lisinopril (PRINIVIL,ZESTRIL) 10 MG tablet Take 1 tablet by mouth daily. 10/12/15  Yes Historical Provider, MD  meloxicam (MOBIC) 7.5 MG tablet Take 1 tablet (7.5 mg total) by mouth daily. 03/03/16  Yes Tresa Garter, MD  methocarbamol (ROBAXIN) 750 MG tablet Take 1 tablet (750 mg total) by mouth 2 (two) times daily as needed for muscle spasms. 11/13/15  Yes Naiping Ephriam Jenkins, MD  Multiple Vitamin (MULTIVITAMIN WITH MINERALS) TABS tablet Take 1 tablet by mouth daily.   Yes Historical Provider, MD  omeprazole (PRILOSEC) 40 MG capsule Take 1 capsule (40 mg total) by mouth daily. 10/08/15  Yes Tresa Garter, MD  Polyvinyl Alcohol-Povidone (REFRESH OP) Place 1 drop into both eyes 2 (two) times daily as needed.    Yes Historical Provider, MD  senna-docusate (SENOKOT S) 8.6-50 MG tablet Take 1 tablet by mouth at bedtime as needed. 11/13/15  Yes Leandrew Koyanagi, MD  acetaminophen-codeine (TYLENOL #3) 300-30 MG tablet Take 1 tablet by mouth every 4 (four) hours as needed. 03/03/16   Tresa Garter, MD     Objective:   Filed Vitals:   03/03/16 1107  BP: 114/64  Pulse: 91  Temp: 98.7 F (37.1 C)  TempSrc: Oral  Resp: 18  Height: 5' (1.524 m)  Weight: 174 lb (78.926 kg)  SpO2: 98%    Exam General appearance : Awake, alert, not in any distress. Speech Clear. Not toxic looking HEENT: Atraumatic and Normocephalic, pupils equally reactive to light and accomodation Neck: supple, no  JVD. No cervical lymphadenopathy.  Chest:Good air entry bilaterally, no added sounds  CVS: S1 S2 regular, no murmurs.  Abdomen: Bowel sounds present, Non tender and not distended with no gaurding, rigidity or rebound. Extremities: B/L Lower Ext shows no edema, both legs are warm to touch Neurology: Awake alert, and oriented X 3, CN II-XII intact, Non focal Skin:No Rash  Data Review Lab Results  Component Value Date   HGBA1C 5.6 12/20/2013   HGBA1C 5.4 06/10/2013     Assessment & Plan   1. Postmenopausal bleeding  - acetaminophen-codeine (TYLENOL #3) 300-30 MG tablet; Take 1 tablet by mouth every 4 (four) hours as needed.  Dispense: 60 tablet; Refill: 0 - US Pelvis Complete; Future - US Transvaginal Non-OB; Future  2. Dyslipidemia  - atorvastatin (LIPITOR) 20 MG tablet; Take 1 tablet (20 mg total) by mouth daily.  Dispense: 90 tablet; Refill: 3  To address this please limit saturated fat to no more than 7% of your calories, limit cholesterol to 200 mg/day, increase fiber and exercise as tolerated. If needed we may add another cholesterol lowering medication to your regimen.   3. Essential hypertension   We have discussed target BP range and blood pressure goal. I have advised patient to check BP regularly and to call us back or report to clinic if the numbers are consistently higher than 140/90. We discussed the importance of compliance with medical therapy and DASH diet recommended, consequences of uncontrolled hypertension discussed.   - continue current BP medications  4. Pain in joint, ankle and foot, unspecified laterality  - meloxicam (MOBIC) 7.5 MG tablet; Take 1 tablet (7.5 mg total) by mouth daily.  Dispense: 30 tablet; Refill: 3 - acetaminophen-codeine (TYLENOL #3) 300-30 MG tablet; Take 1 tablet by mouth every 4 (four) hours as needed.  Dispense: 60 tablet; Refill: 0 Patient have been counseled extensively about nutrition and exercise  Return in about 3 months  (around 06/03/2016) for Follow up HTN, Follow up Pain and comorbidities.  Interpreter was used to communicate directly with patient for the entire encounter including providing detailed patient instructions.  The patient was given clear instructions to go to ER or return to medical center if symptoms don't improve, worsen or new problems develop. The patient verbalized understanding. The patient was told to call to get lab results if they haven't heard anything in the next week.   This note has been created with Surveyor, quantity. Any transcriptional errors are unintentional.    Angelica Chessman, MD, [place], [place], [place], [place] and [place], [place]   03/03/2016, 11:52 AM

## 2016-03-03 NOTE — Patient Instructions (Signed)
Plan de alimentacin DASH (DASH Eating Plan) DASH es la sigla en ingls de "Enfoques Alimentarios para Detener la Hipertensin". El plan de alimentacin DASH ha demostrado bajar la presin arterial elevada (hipertensin). Los beneficios adicionales para la salud pueden incluir la disminucin del riesgo de diabetes mellitus tipo2, enfermedades cardacas e ictus. Este plan tambin puede ayudar a Horticulturist, commercial. QU DEBO SABER ACERCA DEL PLAN DE ALIMENTACIN DASH? Para el plan de alimentacin DASH, seguir las siguientes pautas generales:  Elija los alimentos con un valor porcentual diario de sodio de menos del 5% (segn figura en la etiqueta del alimento).  Use hierbas o aderezos sin sal, en lugar de sal de mesa o sal marina.  Consulte al mdico o farmacutico antes de usar sustitutos de la sal.  Coma productos con bajo contenido de sodio, cuya etiqueta suele decir "bajo contenido de sodio" o "sin agregado de sal".  Coma alimentos frescos.  Coma ms verduras, frutas y productos lcteos con bajo contenido de [place].  Elija los cereales integrales. Busque la palabra "integral" en Equities trader de la lista de ingredientes.  Elija el pescado y el pollo o el pavo sin piel ms a menudo que las carnes rojas. Limite el consumo de pescado, carne de ave y carne a 6onzas (170g) por Training and development officer.  Limite el consumo de dulces, postres, azcares y bebidas azucaradas.  Elija las grasas saludables para el corazn.  Limite el consumo de queso a 1onza (28g) por Training and development officer.  Consuma ms comida casera y menos de restaurante, de buf y comida rpida.  Limite el consumo de alimentos fritos.  Cocine los alimentos utilizando mtodos que no sean la fritura.  Limite las verduras enlatadas. Si las consume, enjuguelas bien para disminuir el sodio.  Cuando coma en un restaurante, pida que preparen su comida con menos sal o, en lo posible, sin nada de sal. QU ALIMENTOS PUEDO COMER? Pida ayuda a un nutricionista para  conocer las necesidades calricas individuales. Cereales Pan de salvado o integral. Arroz integral. Pastas de salvado o integrales. Quinua, trigo burgol y cereales integrales. Cereales con bajo contenido de sodio. Tortillas de harina de maz o de salvado. Pan de maz integral. Galletas saladas integrales. Galletas con bajo contenido de [place]. Vegetales Verduras frescas o congeladas (crudas, al vapor, asadas o grilladas). Jugos de tomate y verduras con contenido bajo o reducido de sodio. Pasta y salsa de tomate con contenido bajo o [place]. Verduras enlatadas con bajo contenido de sodio o reducido de sodio.  Lambert Mody Lambert Mody frescas, en conserva (en su jugo natural) o frutas congeladas. Carnes y otros productos con protenas Carne de res molida (al 85% o ms [place]), carne de res de animales alimentados con pastos o carne de res sin la grasa. Pollo o pavo sin piel. Carne de pollo o de [place]. Cerdo sin la grasa. Todos los pescados y frutos de mar. Huevos. Porotos, guisantes o lentejas secos. Frutos secos y semillas sin sal. Frijoles enlatados sin sal. Lcteos Productos lcteos con bajo contenido de grasas, como [place] o al 1%, quesos reducidos en grasas o al 2%, ricota con bajo contenido de grasas o [employer], o yogur natural con bajo contenido de [place]. Quesos con contenido bajo o reducido de sodio. Grasas y Naval architect en barra que no contengan grasas trans. [place] y alios para ensaladas livianos o reducidos en grasas (reducidos en sodio). [place]. Aceites de crtamo, oliva o canola. [place] natural de man o almendra. Otros Palom[place] de maz y pretzels sin sal.  Los artculos mencionados arriba pueden no ser una lista completa de las bebidas o los alimentos recomendados. Comunquese con el nutricionista para conocer ms opciones. QU ALIMENTOS NO SE RECOMIENDAN? Cereales Pan blanco. Pastas blancas. Arroz blanco. Pan de maz refinado. Bagels y  croissants. Galletas saladas que contengan grasas trans. Vegetales Vegetales con crema o fritos. Verduras en salsa de queso. Verduras enlatadas comunes. Pasta y salsa de tomate en lata comunes. Jugos comunes de tomate y de verduras. Frutas Frutas secas. Fruta enlatada en almbar liviano o espeso. Jugo de frutas. Carnes y otros productos con protenas [place] de carne con grasa. Costillas, alas de pollo, tocineta, salchicha, mortadela, salame, chinchulines, tocino, perros calientes, salchichas alemanas y embutidos envasados. Frutos secos y semillas con sal. Frijoles con sal en lata. Lcteos Leche entera o al 2%, crema, mezcla de leche y crema, y queso crema. Yogur entero o endulzado. Quesos o queso azul con alto contenido de grasas. [place]rem[place] no lcteas y coberturas batidas. Quesos procesados, quesos para untar o cuajadas. Condimentos Sal de cebolla y ajo, sal condimentada, sal de mesa y sal marina. Salsas en lata y envasadas. Salsa [place]. Salsa trtara. Salsa barbacoa. Salsa teriyaki. Salsa de soja, incluso la que tiene contenido reducido de sodio. Salsa de carne. Salsa de pescado. Salsa de ostras. Salsa rosada. Rbano picante. Ketchup y mostaza. Saborizantes y tiernizantes para carne. Caldo en cubitos. Salsa picante. Salsa tabasco. Adobos. Aderezos para tacos. Salsas. Grasas y aceites [place], margarina en barra, manteca de cerdo, grasa, mantequilla clarificada y grasa de tocino. Aceites de coco, de palmiste o de palma. Aderezos comunes para ensalada. Otros Pickles y aceitunas. Palomitas de maz y pretzels con sal. Los artculos mencionados arriba pueden no ser una lista completa de las bebidas y los alimentos que se deben evitar. Comunquese con el nutricionista para obtener ms informacin. DNDE PUEDO ENCONTRAR MS INFORMACIN? Instituto Nacional del Corazn, del Pulmn y de la Sangre (National Heart, Lung, and Blood Institute): www.nhlbi.nih.gov/health/health-topics/topics/dash/     Esta informacin no tiene como fin reemplazar el consejo del mdico. Asegrese de hacerle al mdico cualquier pregunta que tenga.   Document Released: 10/06/2011 Document Revised: 11/07/2014 Elsevier Interactive Patient Education 2016 Elsevier Inc. Hipertensin (Hypertension) La hipertensin, conocida comnmente como presin arterial alta, se produce cuando la sangre bombea en las arterias con mucha fuerza. Las arterias son los vasos sanguneos que transportan la sangre desde el corazn hacia todas las partes del cuerpo. Una lectura de la presin arterial consiste en un nmero ms alto sobre un nmero ms bajo, por ejemplo, 110/72. El nmero ms alto (presin sistlica) corresponde a la presin interna de las arterias cuando el corazn bombea sangre. El nmero ms bajo (presin diastlica) corresponde a la presin interna de las arterias cuando el corazn se relaja. En condiciones ideales, la presin arterial debe ser inferior a 120/80. La hipertensin fuerza al corazn a trabajar ms para bombear la sangre. Las arterias pueden estrecharse o ponerse rgidas. La hipertensin no tratada o no controlada puede causar infarto de miocardio, ictus, enfermedad renal y otros problemas. FACTORES DE RIESGO Algunos factores de riesgo de hipertensin son controlables, pero otros no lo son.  Entre los factores de riesgo que usted no puede controlar, se incluyen los siguientes:   La raza. El riesgo es mayor para las personas afroamericanas.  La edad. Los riesgos aumentan con la edad.  El sexo. Antes de los 45aos, los hombres corren ms riesgo que las mujeres. Despus de los 65aos, las mujeres corren ms riesgo que los hombres. Entre   los factores de riesgo que usted puede controlar, se incluyen los siguientes:  No hacer la cantidad suficiente de actividad fsica o ejercicio.  Tener sobrepeso.  Consumir mucha grasa, azcar, caloras o sal en la dieta.  Beber alcohol en exceso. SIGNOS Y SNTOMAS Por  lo general, la hipertensin no causa signos o sntomas. La hipertensin arterial demasiado alta (crisis hipertensiva) puede causar dolor de cabeza, ansiedad, falta de aire y hemorragia nasal. DIAGNSTICO Para detectar si usted tiene hipertensin, el mdico le medir la presin arterial mientras est sentado, con el brazo levantado a la altura del corazn. Debe medirla al menos dos veces en el mismo brazo. Determinadas condiciones pueden causar una diferencia de presin arterial entre el brazo izquierdo y el derecho. El hecho de tener una sola lectura de la presin arterial ms alta que lo normal no significa que necesita un tratamiento. Si no est claro si tiene hipertensin arterial, es posible que se le pida que regrese otro da para volver a controlarle la presin arterial. O bien se le puede pedir que se controle la presin arterial en su casa durante 1 o ms meses. TRATAMIENTO El tratamiento de la hipertensin arterial incluye hacer cambios en el estilo de vida y, posiblemente, tomar medicamentos. Un estilo de vida saludable puede ayudar a bajar la presin arterial alta. Quiz deba cambiar algunos hbitos. Los cambios en el estilo de vida pueden incluir lo siguiente:  Seguir la dieta DASH. Esta dieta tiene un alto contenido de frutas, verduras y cereales integrales. Incluye poca cantidad de sal, carnes rojas y azcares agregados.  Mantenga el consumo de sodio por debajo de 2300 mg por da.  Realizar al menos entre 30 y 45 minutos de ejercicio aerbico, 4 veces por semana como mnimo.  Perder peso, si es necesario.  No fumar.  Limitar el consumo de bebidas alcohlicas.  Aprender formas de reducir el estrs. El mdico puede recetarle medicamentos si los cambios en el estilo de vida no son suficientes para lograr controlar la presin arterial y si una de las siguientes afirmaciones es verdadera:  Tiene entre 18 y 59 aos y su presin arterial sistlica est por encima de 140.  Tiene 60  aos o ms y su presin arterial sistlica est por encima de 150.  Su presin arterial diastlica est por encima de 90.  Tiene diabetes y su presin arterial sistlica est por encima de 140 o su presin arterial diastlica est por encima de 90.  Tiene una enfermedad renal y su presin arterial est por encima de 140/90.  Tiene una enfermedad cardaca y su presin arterial est por encima de 140/90. La presin arterial deseada puede variar en funcin de las enfermedades, la edad y otros factores personales. INSTRUCCIONES PARA EL CUIDADO EN EL HOGAR  Haga que le midan de nuevo la presin arterial segn las indicaciones del mdico.  Tome los medicamentos solamente como se lo haya indicado el mdico. Siga cuidadosamente las indicaciones. Los medicamentos para la presin arterial deben tomarse segn las indicaciones. Los medicamentos pierden eficacia al omitir las dosis. El hecho de omitir las dosis tambin aumenta el riesgo de otros problemas.  No fume.  Contrlese la presin arterial en su casa segn las indicaciones del mdico. SOLICITE ATENCIN MDICA SI:   Piensa que tiene una reaccin alrgica a los medicamentos.  Tiene mareos o dolores de cabeza con recurrencia.  Tiene hinchazn en los tobillos.  Tiene problemas de visin. SOLICITE ATENCIN MDICA DE INMEDIATO SI:  Siente un dolor de cabeza intenso   o confusin.  Siente debilidad inusual, adormecimiento o que Geneticist, molecular.  Siente dolor intenso en el pecho o en el abdomen.  Vomita repetidas veces.  Tiene dificultad para respirar. ASEGRESE DE QUE:   Comprende estas instrucciones.  Controlar su afeccin.  Recibir ayuda de inmediato si no mejora o si empeora.   Esta informacin no tiene Marine scientist el consejo del mdico. Asegrese de hacerle al mdico cualquier pregunta que tenga.   Document Released: 10/17/2005 Document Revised: 03/03/2015 Elsevier Interactive Patient Education 2016 [place] (Postmenopausal Bleeding) El sangrado postmenopusico es el sangrado que tiene una mujer despus de haber entrado en la menopausia. La menopausia es el final de la edad frtil de la [place]. Despus de la menopausia, una mujer deja de ovular y de tener perodos [place].  La hemorragia postmenopusica puede tener varias causas. Cualquier tipo de hemorragia postmenopusica, incluso si parece ser un perodo menstrual tpico, es preocupante. Esto lo evaluar el mdico. Cualquier tratamiento depender de la causa del sangrado. INSTRUCCIONES PARA EL CUIDADO EN EL HOGAR Controle su afeccin para ver si hay cambios. Las siguientes indicaciones ayudarn a Chief Strategy Officer que pueda sentir:  Evite las duchas vaginales y el uso de tampones segn lo que le indique su mdico.  [place] compresas con frecuencia.  Hgase exmenes plvicos regulares y pruebas de [place]apan[place]ola[place].  Cumpla con todas las visitas de control, segn le indique su mdico. SOLICITE ATENCIN MDICA SI:   El sangrado dura ms de 1 semana.  Siente dolor abdominal.  Tiene hemorragias [place]. SOLICITE ATENCIN MDICA DE INMEDIATO SI:   Usted tiene fiebre, escalofros, mareos, dolor de cabeza, dolores musculares y Pensions consultant.  Tiene dolor con el sangrado.  Elimina cogulos de [place].  Tiene sangrado y necesita ms de un apsito por hora.  Siente que va a desmayarse. ASEGRESE DE QUE:  Comprende estas instrucciones.  Controlar su afeccin.  Recibir ayuda de inmediato si no mejora o si empeora.   Esta informacin no tiene Marine scientist el consejo del mdico. Asegrese de hacerle al mdico cualquier pregunta que tenga.   Document Released: 04/04/2008 Document Revised: 08/07/2013 Elsevier Interactive Patient Education [employer].

## 2016-03-04 ENCOUNTER — Encounter: Payer: No Typology Code available for payment source | Admitting: Physical Therapy

## 2016-03-04 ENCOUNTER — Telehealth: Payer: Self-pay | Admitting: *Deleted

## 2016-03-04 NOTE — Telephone Encounter (Signed)
Medical Assistant used [employer] to contact patient.  Interpreter Name: Raul Del #: [id number] Unable to leave a voice message. Patients Emergency contact did not answer as well.  !!!Please inform patient of Ultrasound of the pelvis and transvaginal being scheduled at [facility] on 03/10/16. Patient should arrive at 10:45 for registration. US's are at 11:00. Patient is to drink 32oz of fluid 1 hour prior to appointment and arrive with a full bladder!!!

## 2016-03-07 ENCOUNTER — Encounter: Payer: No Typology Code available for payment source | Admitting: Physical Therapy

## 2016-03-09 ENCOUNTER — Other Ambulatory Visit: Payer: Self-pay | Admitting: Orthopaedic Surgery

## 2016-03-10 ENCOUNTER — Encounter (HOSPITAL_BASED_OUTPATIENT_CLINIC_OR_DEPARTMENT_OTHER): Payer: Self-pay | Admitting: *Deleted

## 2016-03-10 ENCOUNTER — Ambulatory Visit (HOSPITAL_COMMUNITY)
Admission: RE | Admit: 2016-03-10 | Discharge: 2016-03-10 | Disposition: A | Discharge status: Home / Self Care | Payer: No Typology Code available for payment source | Source: Ambulatory Visit | Attending: Internal Medicine | Admitting: Internal Medicine

## 2016-03-10 DIAGNOSIS — N950 Postmenopausal bleeding: Secondary | ICD-10-CM | POA: Insufficient documentation

## 2016-03-11 ENCOUNTER — Ambulatory Visit: Payer: Self-pay | Admitting: Physical Therapy

## 2016-03-15 NOTE — Pre-Procedure Instructions (Signed)
Susan Lopez at [facility] for [facility] notified of time change for surgery; will need interpreter 0900 - 1300.

## 2016-03-16 ENCOUNTER — Ambulatory Visit (HOSPITAL_BASED_OUTPATIENT_CLINIC_OR_DEPARTMENT_OTHER)
Admission: RE | Admit: 2016-03-16 | Discharge: 2016-03-16 | Disposition: A | Discharge status: Home / Self Care | Payer: Self-pay | Source: Ambulatory Visit | Attending: Orthopaedic Surgery | Admitting: Orthopaedic Surgery

## 2016-03-16 ENCOUNTER — Encounter (HOSPITAL_BASED_OUTPATIENT_CLINIC_OR_DEPARTMENT_OTHER)
Admission: RE | Disposition: A | Discharge status: Home / Self Care | Payer: Self-pay | Source: Ambulatory Visit | Attending: Orthopaedic Surgery

## 2016-03-16 ENCOUNTER — Encounter (HOSPITAL_BASED_OUTPATIENT_CLINIC_OR_DEPARTMENT_OTHER): Payer: Self-pay | Admitting: Certified Registered"

## 2016-03-16 ENCOUNTER — Ambulatory Visit (HOSPITAL_BASED_OUTPATIENT_CLINIC_OR_DEPARTMENT_OTHER): Payer: Self-pay | Admitting: Anesthesiology

## 2016-03-16 DIAGNOSIS — I10 Essential (primary) hypertension: Secondary | ICD-10-CM | POA: Insufficient documentation

## 2016-03-16 DIAGNOSIS — Z79899 Other long term (current) drug therapy: Secondary | ICD-10-CM | POA: Insufficient documentation

## 2016-03-16 DIAGNOSIS — K219 Gastro-esophageal reflux disease without esophagitis: Secondary | ICD-10-CM | POA: Insufficient documentation

## 2016-03-16 DIAGNOSIS — Z7982 Long term (current) use of aspirin: Secondary | ICD-10-CM | POA: Insufficient documentation

## 2016-03-16 DIAGNOSIS — Z472 Encounter for removal of internal fixation device: Secondary | ICD-10-CM | POA: Insufficient documentation

## 2016-03-16 DIAGNOSIS — Z87891 Personal history of nicotine dependence: Secondary | ICD-10-CM | POA: Insufficient documentation

## 2016-03-16 DIAGNOSIS — M1990 Unspecified osteoarthritis, unspecified site: Secondary | ICD-10-CM | POA: Insufficient documentation

## 2016-03-16 DIAGNOSIS — Z6833 Body mass index (BMI) 33.0-33.9, adult: Secondary | ICD-10-CM | POA: Insufficient documentation

## 2016-03-16 DIAGNOSIS — E669 Obesity, unspecified: Secondary | ICD-10-CM | POA: Insufficient documentation

## 2016-03-16 HISTORY — DX: Other specified postprocedural states: R11.2

## 2016-03-16 HISTORY — DX: Nausea with vomiting, unspecified: Z98.890

## 2016-03-16 HISTORY — PX: HARDWARE REMOVAL: SHX979

## 2016-03-16 LAB — POCT I-STAT, CHEM 8
BUN: 7 mg/dL (ref 6–20)
CALCIUM ION: 1.16 mmol/L (ref 1.12–1.23)
CREATININE: 0.5 mg/dL (ref 0.44–1.00)
Chloride: 107 mmol/L (ref 101–111)
Glucose, Bld: 110 mg/dL — ABNORMAL HIGH (ref 65–99)
HCT: 46 % (ref 36.0–46.0)
HEMOGLOBIN: 15.6 g/dL — AB (ref 12.0–15.0)
Potassium: 3.9 mmol/L (ref 3.5–5.1)
Sodium: 143 mmol/L (ref 135–145)
TCO2: 25 mmol/L (ref 0–100)

## 2016-03-16 SURGERY — REMOVAL, HARDWARE
Anesthesia: General | Site: Ankle | Laterality: Left

## 2016-03-16 MED ORDER — MIDAZOLAM HCL 2 MG/2ML IJ SOLN
1.0000 mg | INTRAMUSCULAR | Status: DC | PRN
  Administered 2016-03-16: 1 mg via INTRAVENOUS

## 2016-03-16 MED ORDER — FENTANYL CITRATE (PF) 100 MCG/2ML IJ SOLN
INTRAMUSCULAR | Status: AC
  Filled 2016-03-16: qty 2

## 2016-03-16 MED ORDER — DEXAMETHASONE SODIUM PHOSPHATE 10 MG/ML IJ SOLN
INTRAMUSCULAR | Status: AC
  Filled 2016-03-16: qty 1

## 2016-03-16 MED ORDER — FENTANYL CITRATE (PF) 100 MCG/2ML IJ SOLN: 25.0000 ug | INTRAMUSCULAR | Status: DC | PRN

## 2016-03-16 MED ORDER — ACETAMINOPHEN 325 MG PO TABS
ORAL_TABLET | ORAL | Status: AC
  Filled 2016-03-16: qty 1

## 2016-03-16 MED ORDER — PROPOFOL 10 MG/ML IV BOLUS
INTRAVENOUS | Status: DC | PRN
  Administered 2016-03-16: 150 mg via INTRAVENOUS

## 2016-03-16 MED ORDER — LIDOCAINE 2% (20 MG/ML) 5 ML SYRINGE
INTRAMUSCULAR | Status: AC
  Filled 2016-03-16: qty 5

## 2016-03-16 MED ORDER — ATROPINE SULFATE 0.4 MG/ML IJ SOLN
INTRAMUSCULAR | Status: AC
  Filled 2016-03-16: qty 1

## 2016-03-16 MED ORDER — BUPIVACAINE HCL (PF) 0.25 % IJ SOLN
INTRAMUSCULAR | Status: AC
  Filled 2016-03-16: qty 30

## 2016-03-16 MED ORDER — SCOPOLAMINE 1 MG/3DAYS TD PT72: 1.0000 | MEDICATED_PATCH | Freq: Once | TRANSDERMAL | Status: DC | PRN

## 2016-03-16 MED ORDER — MIDAZOLAM HCL 2 MG/2ML IJ SOLN
INTRAMUSCULAR | Status: AC
  Filled 2016-03-16: qty 2

## 2016-03-16 MED ORDER — CEFAZOLIN SODIUM-DEXTROSE 2-4 GM/100ML-% IV SOLN
INTRAVENOUS | Status: AC
  Filled 2016-03-16: qty 100

## 2016-03-16 MED ORDER — BUPIVACAINE HCL (PF) 0.5 % IJ SOLN
INTRAMUSCULAR | Status: AC
  Filled 2016-03-16: qty 30

## 2016-03-16 MED ORDER — LIDOCAINE HCL (PF) 1 % IJ SOLN
INTRAMUSCULAR | Status: AC
  Filled 2016-03-16: qty 30

## 2016-03-16 MED ORDER — DEXAMETHASONE SODIUM PHOSPHATE 10 MG/ML IJ SOLN
INTRAMUSCULAR | Status: DC | PRN
  Administered 2016-03-16: 10 mg via INTRAVENOUS

## 2016-03-16 MED ORDER — ONDANSETRON HCL 4 MG/2ML IJ SOLN
INTRAMUSCULAR | Status: DC | PRN
  Administered 2016-03-16: 4 mg via INTRAVENOUS

## 2016-03-16 MED ORDER — LIDOCAINE HCL (CARDIAC) 20 MG/ML IV SOLN
INTRAVENOUS | Status: DC | PRN
  Administered 2016-03-16: 80 mg via INTRAVENOUS

## 2016-03-16 MED ORDER — BUPIVACAINE HCL (PF) 0.25 % IJ SOLN
INTRAMUSCULAR | Status: DC | PRN
  Administered 2016-03-16: 10 mL

## 2016-03-16 MED ORDER — ACETAMINOPHEN 325 MG PO TABS
650.0000 mg | ORAL_TABLET | Freq: Once | ORAL | Status: AC
  Administered 2016-03-16: 650 mg via ORAL

## 2016-03-16 MED ORDER — FENTANYL CITRATE (PF) 100 MCG/2ML IJ SOLN
50.0000 ug | INTRAMUSCULAR | Status: DC | PRN
  Administered 2016-03-16 (×2): 50 ug via INTRAVENOUS

## 2016-03-16 MED ORDER — PROMETHAZINE HCL 25 MG/ML IJ SOLN: 6.2500 mg | INTRAMUSCULAR | Status: DC | PRN

## 2016-03-16 MED ORDER — CEFAZOLIN SODIUM-DEXTROSE 2-4 GM/100ML-% IV SOLN
2.0000 g | INTRAVENOUS | Status: AC
  Administered 2016-03-16: 2 g via INTRAVENOUS

## 2016-03-16 MED ORDER — LACTATED RINGERS IV SOLN
INTRAVENOUS | Status: DC
  Administered 2016-03-16: 10:00:00 via INTRAVENOUS

## 2016-03-16 MED ORDER — 0.9 % SODIUM CHLORIDE (POUR BTL) OPTIME
TOPICAL | Status: DC | PRN
  Administered 2016-03-16: 400 mL

## 2016-03-16 MED ORDER — GLYCOPYRROLATE 0.2 MG/ML IJ SOLN: 0.2000 mg | Freq: Once | INTRAMUSCULAR | Status: DC | PRN

## 2016-03-16 MED ORDER — PROPOFOL 500 MG/50ML IV EMUL
INTRAVENOUS | Status: AC
  Filled 2016-03-16: qty 50

## 2016-03-16 MED ORDER — ONDANSETRON HCL 4 MG/2ML IJ SOLN
INTRAMUSCULAR | Status: AC
  Filled 2016-03-16: qty 2

## 2016-03-16 SURGICAL SUPPLY — 75 items
APL SKNCLS STERI-STRIP NONHPOA (GAUZE/BANDAGES/DRESSINGS)
BANDAGE ACE 4X5 VEL STRL LF (GAUZE/BANDAGES/DRESSINGS) ×2 IMPLANT
BANDAGE ACE 6X5 VEL STRL LF (GAUZE/BANDAGES/DRESSINGS) ×1 IMPLANT
BANDAGE ESMARK 6X9 LF (GAUZE/BANDAGES/DRESSINGS) ×1 IMPLANT
BENZOIN TINCTURE PRP APPL 2/3 (GAUZE/BANDAGES/DRESSINGS) IMPLANT
BLADE HEX COATED 2.75 (ELECTRODE) IMPLANT
BLADE SURG 15 STRL LF DISP TIS (BLADE) ×2 IMPLANT
BLADE SURG 15 STRL SS (BLADE) ×6
BNDG CMPR 9X6 STRL LF SNTH (GAUZE/BANDAGES/DRESSINGS)
BNDG COHESIVE 3X5 TAN STRL LF (GAUZE/BANDAGES/DRESSINGS) ×3 IMPLANT
BNDG ESMARK 6X9 LF (GAUZE/BANDAGES/DRESSINGS)
CANISTER SUCT 1200ML W/VALVE (MISCELLANEOUS) IMPLANT
CLOSURE WOUND 1/2 X4 (GAUZE/BANDAGES/DRESSINGS)
COVER BACK TABLE 60X90IN (DRAPES) ×3 IMPLANT
CUFF TOURNIQUET SINGLE 24IN (TOURNIQUET CUFF) IMPLANT
CUFF TOURNIQUET SINGLE 34IN LL (TOURNIQUET CUFF) IMPLANT
DECANTER SPIKE VIAL GLASS SM (MISCELLANEOUS) IMPLANT
DRAPE EXTREMITY T 121X128X90 (DRAPE) ×3 IMPLANT
DRAPE IMP U-DRAPE 54X76 (DRAPES) ×1 IMPLANT
DRAPE OEC MINIVIEW 54X84 (DRAPES) ×3 IMPLANT
DRAPE U-SHAPE 47X51 STRL (DRAPES) ×2 IMPLANT
DURAPREP 26ML APPLICATOR (WOUND CARE) ×3 IMPLANT
ELECT REM PT RETURN 9FT ADLT (ELECTROSURGICAL) ×3
ELECTRODE REM PT RTRN 9FT ADLT (ELECTROSURGICAL) ×1 IMPLANT
GAUZE SPONGE 4X4 12PLY STRL (GAUZE/BANDAGES/DRESSINGS) ×3 IMPLANT
GAUZE SPONGE 4X4 16PLY XRAY LF (GAUZE/BANDAGES/DRESSINGS) IMPLANT
GAUZE XEROFORM 1X8 LF (GAUZE/BANDAGES/DRESSINGS) ×3 IMPLANT
GLOVE BIOGEL PI IND STRL 7.0 (GLOVE) IMPLANT
GLOVE BIOGEL PI INDICATOR 7.0 (GLOVE) ×2
GLOVE ECLIPSE 6.5 STRL STRAW (GLOVE) ×2 IMPLANT
GLOVE EXAM NITRILE MD LF STRL (GLOVE) ×2 IMPLANT
GLOVE SKINSENSE NS SZ7.5 (GLOVE) ×2
GLOVE SKINSENSE STRL SZ7.5 (GLOVE) ×1 IMPLANT
GLOVE SURG SYN 7.5  E (GLOVE) ×2
GLOVE SURG SYN 7.5 E (GLOVE) ×1 IMPLANT
GLOVE SURG SYN 7.5 PF PI (GLOVE) ×1 IMPLANT
GOWN STRL REIN XL XLG (GOWN DISPOSABLE) ×3 IMPLANT
GOWN STRL REUS W/ TWL LRG LVL3 (GOWN DISPOSABLE) ×1 IMPLANT
GOWN STRL REUS W/TWL LRG LVL3 (GOWN DISPOSABLE) ×3
NEEDLE HYPO 22GX1.5 SAFETY (NEEDLE) ×2 IMPLANT
NS IRRIG 1000ML POUR BTL (IV SOLUTION) ×3 IMPLANT
PACK BASIN DAY SURGERY FS (CUSTOM PROCEDURE TRAY) ×3 IMPLANT
PAD CAST 3X4 CTTN HI CHSV (CAST SUPPLIES) IMPLANT
PAD CAST 4YDX4 CTTN HI CHSV (CAST SUPPLIES) IMPLANT
PADDING CAST COTTON 3X4 STRL (CAST SUPPLIES)
PADDING CAST COTTON 4X4 STRL (CAST SUPPLIES) ×3
PADDING CAST COTTON 6X4 STRL (CAST SUPPLIES) IMPLANT
PADDING CAST SYN 6 (CAST SUPPLIES)
PADDING CAST SYNTHETIC 4 (CAST SUPPLIES)
PADDING CAST SYNTHETIC 4X4 STR (CAST SUPPLIES) IMPLANT
PADDING CAST SYNTHETIC 6X4 NS (CAST SUPPLIES) IMPLANT
PENCIL BUTTON HOLSTER BLD 10FT (ELECTRODE) ×1 IMPLANT
SLEEVE SCD COMPRESS KNEE MED (MISCELLANEOUS) ×2 IMPLANT
SPLINT FAST PLASTER 5X30 (CAST SUPPLIES)
SPLINT FIBERGLASS 3X35 (CAST SUPPLIES) IMPLANT
SPLINT FIBERGLASS 4X30 (CAST SUPPLIES) IMPLANT
SPLINT PLASTER CAST FAST 5X30 (CAST SUPPLIES) IMPLANT
SPONGE LAP 18X18 X RAY DECT (DISPOSABLE) IMPLANT
SPONGE LAP 4X18 X RAY DECT (DISPOSABLE) IMPLANT
STAPLER VISISTAT (STAPLE) IMPLANT
STRIP CLOSURE SKIN 1/2X4 (GAUZE/BANDAGES/DRESSINGS) IMPLANT
SUCTION FRAZIER HANDLE 10FR (MISCELLANEOUS)
SUCTION TUBE FRAZIER 10FR DISP (MISCELLANEOUS) ×1 IMPLANT
SUT ETHILON 3 0 PS 1 (SUTURE) ×2 IMPLANT
SUT MNCRL AB 4-0 PS2 18 (SUTURE) IMPLANT
SUT VIC AB 2-0 CT1 27 (SUTURE) ×3
SUT VIC AB 2-0 CT1 TAPERPNT 27 (SUTURE) ×1 IMPLANT
SYR BULB 3OZ (MISCELLANEOUS) ×3 IMPLANT
SYR CONTROL 10ML LL (SYRINGE) ×3 IMPLANT
TOWEL OR 17X24 6PK STRL BLUE (TOWEL DISPOSABLE) ×3 IMPLANT
TOWEL OR NON WOVEN STRL DISP B (DISPOSABLE) ×1 IMPLANT
TUBE CONNECTING 20'X1/4 (TUBING)
TUBE CONNECTING 20X1/4 (TUBING) ×1 IMPLANT
UNDERPAD 30X30 (UNDERPADS AND DIAPERS) ×3 IMPLANT
YANKAUER SUCT BULB TIP NO VENT (SUCTIONS) IMPLANT

## 2016-03-16 NOTE — Discharge Instructions (Signed)
Postoperative instructions: ° °Weightbearing: as tolerated ° °Keep your dressing and/or splint clean and dry at all times.  You can remove your dressing on post-operative day #3 and change with a dry/sterile dressing or Band-Aids as needed thereafter.   ° °Incision instructions:  Do not soak your incision for 3 weeks after surgery.  If the incision gets wet, pat dry and do not scrub the incision. ° °Pain control:  You have been given a prescription to be taken as directed for post-operative pain control.  In addition, elevate the operative extremity above the heart at all times to prevent swelling and throbbing pain. ° °Take over-the-counter Colace, 100mg by mouth twice a day while taking narcotic pain medications to help prevent constipation. ° °Follow up appointments: °1) 10-14 days for suture removal and wound check. °2) Dr. Xu as scheduled. ° ° ------------------------------------------------------------------------------------------------------------- ° °After Surgery Pain Control: ° °After your surgery, post-surgical discomfort or pain is likely. This discomfort can last several days to a few weeks. At certain times of the day your discomfort may be more intense.  °Did you receive a nerve block?  °A nerve block can provide pain relief for one hour to two days after your surgery. As long as the nerve block is working, you will experience little or no sensation in the area the surgeon operated on.  °As the nerve block wears off, you will begin to experience pain or discomfort. It is very important that you begin taking your prescribed pain medication before the nerve block fully wears off. Treating your pain at the first sign of the block wearing off will ensure your pain is better controlled and more tolerable when full-sensation returns. Do not wait until the pain is intolerable, as the medicine will be less effective. It is better to treat pain in advance than to try and catch up.  °General Anesthesia:  °If  you did not receive a nerve block during your surgery, you will need to start taking your pain medication shortly after your surgery and should continue to do so as prescribed by your surgeon.  °Pain Medication:  °Most commonly we prescribe Vicodin and Percocet for post-operative pain. Both of these medications contain a combination of acetaminophen (Tylenol®) and a narcotic to help control pain.  °· It takes between 30 and 45 minutes before pain medication starts to work. It is important to take your medication before your pain level gets too intense.  °· Nausea is a common side effect of many pain medications. You will want to eat something before taking your pain medicine to help prevent nausea.  °· If you are taking a prescription pain medication that contains acetaminophen, we recommend that you do not take additional over the counter acetaminophen (Tylenol®).  °Other pain relieving options:  °· Using a cold pack to ice the affected area a few times a day (15 to 20 minutes at a time) can help to relieve pain, reduce swelling and bruising.  °· Elevation of the affected area can also help to reduce pain and swelling. ° ° ° ° ° °Post Anesthesia Home Care Instructions ° °Activity: °Get plenty of rest for the remainder of the day. A responsible adult should stay with you for 24 hours following the procedure.  °For the next 24 hours, DO NOT: °-Drive a car °-Operate machinery °-Drink alcoholic beverages °-Take any medication unless instructed by your physician °-Make any legal decisions or sign important papers. ° °Meals: °Start with liquid foods such as gelatin or   soup. Progress to regular foods as tolerated. Avoid greasy, spicy, heavy foods. If nausea and/or vomiting occur, drink only clear liquids until the nausea and/or vomiting subsides. Call your physician if vomiting continues. ° °Special Instructions/Symptoms: °Your throat may feel dry or sore from the anesthesia or the breathing tube placed in your throat  during surgery. If this causes discomfort, gargle with warm salt water. The discomfort should disappear within 24 hours. ° °If you had a scopolamine patch placed behind your ear for the management of post- operative nausea and/or vomiting: ° °1. The medication in the patch is effective for 72 hours, after which it should be removed.  Wrap patch in a tissue and discard in the trash. Wash hands thoroughly with soap and water. °2. You may remove the patch earlier than 72 hours if you experience unpleasant side effects which may include dry mouth, dizziness or visual disturbances. °3. Avoid touching the patch. Wash your hands with soap and water after contact with the patch. °  ° ° °

## 2016-03-16 NOTE — Anesthesia Postprocedure Evaluation (Signed)
Anesthesia Post Note  Patient: Susan Lopez  Procedure(s) Performed: Procedure(s) (LRB): LEFT ANKLE HARDWARE REMOVAL (Left)  Patient location during evaluation: PACU Anesthesia Type: General Level of consciousness: awake and alert Pain management: pain level controlled Vital Signs Assessment: post-procedure vital signs reviewed and stable Respiratory status: spontaneous breathing, nonlabored ventilation, respiratory function stable and patient connected to nasal cannula oxygen Cardiovascular status: blood pressure returned to baseline and stable Postop Assessment: no signs of nausea or vomiting Anesthetic complications: no    Last Vitals:  Filed Vitals:   03/16/16 1200 03/16/16 1215  BP: 118/78 120/76  Pulse: 78 80  Temp:  36.9 C  Resp: 15 20    Last Pain:  Filed Vitals:   03/16/16 1217  PainSc: 5                  [name]

## 2016-03-16 NOTE — H&P (Signed)
PREOPERATIVE H&P  Chief Complaint: Left ankle retained hardware  HPI: Susan Lopez is a 55 y.o. female who presents for surgical treatment of Left ankle retained hardware.  She denies any changes in medical history.  Past Medical History  Diagnosis Date  . Iron deficiency anemia   . Hypertension   . Arthritis   . Gallstones   . Hemorrhoids, external 10-21-11    occ. bothersome  . Calculus of gallbladder with acute and chronic cholecystitis without obstruction, s/p lap chole [id number] 09/19/2011  . Bone lesion 10/04/2013  . Mammogram abnormal 10/04/2013  . Complication of anesthesia   . Depression   . Anxiety   . PONV (postoperative nausea and vomiting)    Past Surgical History  Procedure Laterality Date  . Cesarean section  1985, 1992  . Cholecystectomy  10/27/2011    Procedure: LAPAROSCOPIC CHOLECYSTECTOMY WITH INTRAOPERATIVE CHOLANGIOGRAM;  Surgeon: Adin Hector, MD;  Location: [facility];  Service: General;  Laterality: N/A;  Laparoscopic Chole w/ IOC Single Site  . Eye surgery  1`12-21-10    bil. for tissue growth-laser surgery  . Orif ankle fracture Left 11/13/2015    Procedure: OPEN REDUCTION INTERNAL FIXATION (ORIF) LEFT BIMALLEOLAR ANKLE FRACTURE;  Surgeon: Leandrew Koyanagi, MD;  Location: [facility];  Service: Orthopedics;  Laterality: Left;   Social History   Social History  . Marital Status: Single    Spouse Name: N/A  . Number of Children: N/A  . Years of Education: N/A   Social History Main Topics  . Smoking status: Former Smoker -- 0.25 packs/day for 10 years    Quit date: 10/20/2004  . Smokeless tobacco: Never Used  . Alcohol Use: No  . Drug Use: No  . Sexual Activity: Yes    Birth Control/ Protection: Post-menopausal   Other Topics Concern  . None   Social History Narrative   Family History  Problem Relation Age of Onset  . Colon cancer Neg Hx   . Hypertension Mother   . Cancer Mother   . Hypertension Father    Allergies  Allergen  Reactions  . Hydrocodone Nausea And Vomiting  . Tramadol Nausea Only  . Oxycodone Rash   Prior to Admission medications   Medication Sig Start Date End Date Taking? Authorizing Provider  acetaminophen-codeine (TYLENOL #3) 300-30 MG tablet Take 1 tablet by mouth every 4 (four) hours as needed. 03/03/16  Yes Tresa Garter, MD  atorvastatin (LIPITOR) 20 MG tablet Take 1 tablet (20 mg total) by mouth daily. 03/03/16  Yes Tresa Garter, MD  gabapentin (NEURONTIN) 100 MG capsule Take 1 capsule (100 mg total) by mouth 2 (two) times daily. 03/09/15  Yes Deepak Advani, MD  lisinopril (PRINIVIL,ZESTRIL) 10 MG tablet Take 1 tablet by mouth daily. 10/12/15  Yes Historical Provider, MD  meloxicam (MOBIC) 7.5 MG tablet Take 1 tablet (7.5 mg total) by mouth daily. 03/03/16  Yes Tresa Garter, MD  Multiple Vitamin (MULTIVITAMIN WITH MINERALS) TABS tablet Take 1 tablet by mouth daily.   Yes Historical Provider, MD  omeprazole (PRILOSEC) 40 MG capsule Take 1 capsule (40 mg total) by mouth daily. 10/08/15  Yes Tresa Garter, MD  Polyvinyl Alcohol-Povidone (REFRESH OP) Place 1 drop into both eyes 2 (two) times daily as needed.    Yes Historical Provider, MD  senna-docusate (SENOKOT S) 8.6-50 MG tablet Take 1 tablet by mouth at bedtime as needed. 11/13/15  Yes Naiping Ephriam Jenkins, MD  aspirin EC 325 MG tablet Take 1  tablet (325 mg total) by mouth 2 (two) times daily. 11/13/15   Naiping Ephriam Jenkins, MD  cyclobenzaprine (FLEXERIL) 10 MG tablet Take 1 tablet (10 mg total) by mouth 3 (three) times daily as needed for muscle spasms. 10/08/15   Tresa Garter, MD     Positive ROS: All other systems have been reviewed and were otherwise negative with the exception of those mentioned in the HPI and as above.  Physical Exam: General: Alert, no acute distress Cardiovascular: No pedal edema Respiratory: No cyanosis, no use of accessory musculature GI: abdomen soft Skin: No lesions in the area of chief  complaint Neurologic: Sensation intact distally Psychiatric: Patient is competent for consent with normal mood and affect Lymphatic: no lymphedema  MUSCULOSKELETAL: exam stable  Assessment: Left ankle retained hardware  Plan: Plan for Procedure(s): LEFT ANKLE HARDWARE REMOVAL  The risks benefits and alternatives were discussed with the patient including but not limited to the risks of nonoperative treatment, versus surgical intervention including infection, bleeding, nerve injury,  blood clots, cardiopulmonary complications, morbidity, mortality, among others, and they were willing to proceed.   Marianna Payment, MD   03/16/2016 10:48 AM

## 2016-03-16 NOTE — Anesthesia Preprocedure Evaluation (Addendum)
Anesthesia Evaluation  Patient identified by MRN, date of birth, ID band Patient awake  General Assessment Comment:PAST MEDICAL HISTORY:  Past Medical History Diagnosis Date . Iron deficiency anemia  . Hypertension  . Arthritis  . Gallstones  . Hemorrhoids, external 10-21-11   occ. bothersome . Calculus of gallbladder with acute and chronic cholecystitis without obstruction, s/p lap chole 27Dec2012 09/19/2011 . Bone lesion 10/04/2013 . Mammogram abnormal 10/04/2013 . Complication of anesthesia  . Dysrhythmia    "fast time" . Depression  . Anxiety       Reviewed: Allergy & Precautions, NPO status , Patient's Chart, lab work & pertinent test results  History of Anesthesia Complications (+) PONV and history of anesthetic complications  Airway Mallampati: II  TM Distance: >3 FB Neck ROM: Full    Dental no notable dental hx.    Pulmonary former smoker,    Pulmonary exam normal breath sounds clear to auscultation       Cardiovascular hypertension, Pt. on medications Normal cardiovascular exam Rhythm:Regular Rate:Normal     Neuro/Psych  Headaches, PSYCHIATRIC DISORDERS Anxiety Depression    GI/Hepatic Neg liver ROS, GERD  Medicated and Controlled,Abdominal pain for which she sought medical care on 03/03/16. Ultrasound ordered and showed endometrial polyps or fibroids.  Denies nausea   Endo/Other  negative endocrine ROS  Renal/GU negative Renal ROS  negative genitourinary   Musculoskeletal  (+) Arthritis ,   Abdominal (+) + obese,   Peds negative pediatric ROS (+)  Hematology  (+) anemia ,   Anesthesia Other Findings   Reproductive/Obstetrics negative OB ROS                           Anesthesia Physical Anesthesia Plan  ASA: II  Anesthesia Plan: General   Post-op Pain Management:    Induction: Intravenous  Airway Management  Planned: LMA  Additional Equipment:   Intra-op Plan:   Post-operative Plan: Extubation in OR  Informed Consent: I have reviewed the patients History and Physical, chart, labs and discussed the procedure including the risks, benefits and alternatives for the proposed anesthesia with the patient or authorized representative who has indicated his/her understanding and acceptance.   Dental advisory given  Plan Discussed with: CRNA  Anesthesia Plan Comments:         Anesthesia Quick Evaluation

## 2016-03-16 NOTE — Transfer of Care (Signed)
Immediate Anesthesia Transfer of Care Note  Patient: Susan Lopez  Procedure(s) Performed: Procedure(s): LEFT ANKLE HARDWARE REMOVAL (Left)  Patient Location: PACU  Anesthesia Type:General  Level of Consciousness: awake, alert , oriented and patient cooperative  Airway & Oxygen Therapy: Patient Spontanous Breathing and Patient connected to face mask oxygen  Post-op Assessment: Report given to RN, Post -op Vital signs reviewed and stable and Patient moving all extremities  Post vital signs: Reviewed and stable  Last Vitals:  Filed Vitals:   03/16/16 0925  BP: 110/69  Pulse: 88  Temp: 36.7 C  Resp: 18    Last Pain: There were no vitals filed for this visit.    Patients Stated Pain Goal: 0 ([id number])  Complications: No apparent anesthesia complications

## 2016-03-16 NOTE — Anesthesia Procedure Notes (Signed)
Procedure Name: LMA Insertion Date/Time: 03/16/2016 11:08 AM Performed by: Baxter Flattery Pre-anesthesia Checklist: Patient identified, Emergency Drugs available, Suction available and Patient being monitored Patient Re-evaluated:Patient Re-evaluated prior to inductionOxygen Delivery Method: Circle System Utilized Preoxygenation: Pre-oxygenation with 100% oxygen Intubation Type: IV induction Ventilation: Mask ventilation without difficulty LMA: LMA inserted LMA Size: 4.0 Number of attempts: 1 Airway Equipment and Method: Bite block Placement Confirmation: positive ETCO2 and breath sounds checked- equal and bilateral Tube secured with: Tape Dental Injury: Teeth and Oropharynx as per pre-operative assessment

## 2016-03-16 NOTE — Op Note (Signed)
   Date of Surgery: 03/16/2016  INDICATIONS: Ms. Dena Billet is a 55 y.o.-year-old female who is approximately 4 months status post ORIF of a left ankle fracture with a syndesmosis injury who comes back today for removal of the syndesmotic screw;  The patient did consent to the procedure after discussion of the risks and benefits.  PREOPERATIVE DIAGNOSIS: Status post operative fixation left ankle syndesmosis  POSTOPERATIVE DIAGNOSIS: Same.  PROCEDURE: Removal of syndesmosis screw from left ankle, deep implant  SURGEON: N. Eduard Roux, M.D.  ASSIST: None.  ANESTHESIA:  general  IV FLUIDS AND URINE: See anesthesia.  ESTIMATED BLOOD LOSS: Minimal mL.  EXPLANTS: syndesmosis screw  DRAINS: none  COMPLICATIONS: None.  DESCRIPTION OF PROCEDURE: The patient was brought to the operating room and placed supine on the operating table.  The patient had been signed prior to the procedure and this was documented. The patient had the anesthesia placed by the anesthesiologist.  A time-out was performed to confirm that this was the correct patient, site, side and location. The patient did receive antibiotics prior to the incision and was re-dosed during the procedure as needed at indicated intervals.  A tourniquet placed.  The patient had the operative extremity prepped and draped in the standard surgical fashion.    A mini C-arm was used to localize the location of the syndesmotic screw. It was made directly over the screw. Full-thickness flaps were elevated. The screw was then backed out with a screwdriver. I then confirmed that the syndesmosis was intact by performing a stress exam in the medial clear space was stable. The wound was then thoroughly irrigated and closed in layer fashion using 2-0 Vicryl and 3-0 nylon. Sterile dressings were applied. Patient tolerated procedure well.  POSTOPERATIVE PLAN: Patient will be weight bear as tolerated  N. Eduard Roux, MD [place] 11:29 AM

## 2016-03-17 ENCOUNTER — Encounter (HOSPITAL_BASED_OUTPATIENT_CLINIC_OR_DEPARTMENT_OTHER): Payer: Self-pay | Admitting: Orthopaedic Surgery

## 2016-03-18 ENCOUNTER — Ambulatory Visit: Payer: No Typology Code available for payment source | Admitting: Physical Therapy

## 2016-03-23 ENCOUNTER — Other Ambulatory Visit: Payer: Self-pay | Admitting: Internal Medicine

## 2016-03-23 DIAGNOSIS — N950 Postmenopausal bleeding: Secondary | ICD-10-CM

## 2016-03-30 ENCOUNTER — Encounter: Payer: Self-pay | Admitting: Physical Therapy

## 2016-03-30 ENCOUNTER — Ambulatory Visit: Payer: No Typology Code available for payment source | Admitting: Physical Therapy

## 2016-03-30 DIAGNOSIS — R600 Localized edema: Secondary | ICD-10-CM

## 2016-03-30 DIAGNOSIS — M6281 Muscle weakness (generalized): Secondary | ICD-10-CM

## 2016-03-30 DIAGNOSIS — M25572 Pain in left ankle and joints of left foot: Secondary | ICD-10-CM

## 2016-03-30 DIAGNOSIS — R262 Difficulty in walking, not elsewhere classified: Secondary | ICD-10-CM

## 2016-03-30 NOTE — Therapy (Signed)
[place], [place], [address] Phone: [phone]   Fax:  [phone]  Physical Therapy Treatment  Patient Details  Name: Susan Lopez MRN: [id number] Date of Birth: [birth year] Referring Provider: Frankey Shown  Encounter Date: 03/30/2016      PT End of Session - 03/30/16 1133    Visit Number 16   Number of Visits 28   Date for PT Re-Evaluation 05/13/16   Authorization Type CAFA exp 05/29/2016   PT Start Time 0812  pt arrived late   PT Stop Time 0848   PT Time Calculation (min) 36 min   Activity Tolerance Patient tolerated treatment well;No increased pain   Behavior During Therapy [facility] for tasks assessed/performed      Past Medical History  Diagnosis Date  . Iron deficiency anemia   . Hypertension   . Arthritis   . Gallstones   . Hemorrhoids, external 10-21-11    occ. bothersome  . Calculus of gallbladder with acute and chronic cholecystitis without obstruction, s/p lap chole 27Dec2012 09/19/2011  . Bone lesion 10/04/2013  . Mammogram abnormal 10/04/2013  . Complication of anesthesia   . Depression   . Anxiety   . PONV (postoperative nausea and vomiting)     Past Surgical History  Procedure Laterality Date  . Cesarean section  1985, 1992  . Cholecystectomy  10/27/2011    Procedure: LAPAROSCOPIC CHOLECYSTECTOMY WITH INTRAOPERATIVE CHOLANGIOGRAM;  Surgeon: Adin Hector, MD;  Location: [facility];  Service: General;  Laterality: N/A;  Laparoscopic Chole w/ IOC Single Site  . Eye surgery  1`12-21-10    bil. for tissue growth-laser surgery  . Orif ankle fracture Left 11/13/2015    Procedure: OPEN REDUCTION INTERNAL FIXATION (ORIF) LEFT BIMALLEOLAR ANKLE FRACTURE;  Surgeon: Leandrew Koyanagi, MD;  Location: [facility];  Service: Orthopedics;  Laterality: Left;  . Hardware removal Left 03/16/2016    Procedure: LEFT ANKLE HARDWARE REMOVAL;  Surgeon: Leandrew Koyanagi, MD;  Location: [facility];   Service: Orthopedics;  Laterality: Left;    There were no vitals filed for this visit.      Subjective Assessment - 03/30/16 0813    Subjective Reports feeling better. Some pain on medial aspect of ankle. Pt reports she was allowed by MD to use cane rather than crutch.    Patient is accompained by: Interpreter   How long can you stand comfortably? approx 2 hourse before elevating   How long can you walk comfortably? 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    Currently in Pain? Yes   Pain Score 3    Pain Location Ankle   Pain Orientation Left;Mid   Pain Descriptors / Indicators Aching;Sharp   Pain Type Surgical pain   Aggravating Factors  walking   Pain Relieving Factors ice, elevation            [facility] PT Assessment - 03/30/16 0001    AROM   Left Ankle Dorsiflexion 8   Left Ankle Plantar Flexion 30   Left Ankle Inversion 360   Strength   Left Hip Flexion 3+/5   Left Knee Flexion 4-/5  knee pain   Left Knee Extension 4/5   Left Ankle Dorsiflexion 4/5   Left Ankle Plantar Flexion 2+/5   Left Ankle Inversion 3/5   Left Ankle Eversion 3/5   Ambulation/Gait   Assistive device L Axillary Crutch   Gait Comments antalgic with leaning into  crutch, improved when not using axillary piece of crutch                     [facility] Adult PT Treatment/Exercise - 03/30/16 0001    Exercises   Exercises Ankle   Knee/Hip Exercises: Stretches   Passive Hamstring Stretch 2 reps;30 seconds   Passive Hamstring Stretch Limitations seated EOB   Knee/Hip Exercises: Sidelying   Clams 30   Ankle Exercises: Stretches   Other Stretch toe extension stretch   Ankle Exercises: Seated   Other Seated Ankle Exercises toe yoga   Ankle Exercises: Supine   Other Supine Ankle Exercises AROM inv/eversion x30                PT Education - 03/30/16 1133    Education provided Yes   Education Details anatomy of condition, POC, HEP, ambulating  with AD   Person(s) Educated Patient   Methods Explanation;Demonstration;Tactile cues;Handout;Verbal cues   Comprehension Verbalized understanding;Returned demonstration;Verbal cues required;Tactile cues required;Need further instruction          PT Short Term Goals - 02/08/16 0909    PT SHORT TERM GOAL #1   Title Pt will be I with initial HEP for continued strengthening and mobility by 02/06/16.   Time 4   Period Weeks   Status Achieved   PT SHORT TERM GOAL #2   Title L ankle DF AROM will improve to 5 degrees pain-free in order to stand for 10 mins without pain.    Time 4   Period Weeks   Status Achieved   PT SHORT TERM GOAL #3   Title Pt will demonstrate proper TTWB with RW for all gait 100 feet, indep by 02/06/16.     Time 4   Period Weeks   Status Achieved   PT SHORT TERM GOAL #4   Title Pt will no longer complain of dizziness with sit to supine transfers by 02/05/16.   Time 4   Period Weeks   Status Achieved           PT Long Term Goals - 03/30/16 0818    PT LONG TERM GOAL #1   Title L ankle PF strength will improve to 3+/5 in order to perform stairs pain free by  7/12   Baseline 2+/5, able to resist MMT but unable to raise into PF in standing   Time 6   Period Weeks   Status New   PT LONG TERM GOAL #2   Title L ankle DF ROM will improve to 15 degrees in order for  pt to walk for 30 mins pain- free with SPC by 7/12   Baseline 8 deg 5/31   Time 6   Period Weeks   Status New   PT LONG TERM GOAL #3   Title Pt will tolerate SLS on L LE ( once FWB by MD)  for 30 secs to improve functional mobility tolerance and decrease risk for falls by 7/12   Baseline unable on 5/31   Time 6   Period Weeks   Status New   PT LONG TERM GOAL #4   Title L ankle malleoli circumference will improve to 24cm by 7/12   Baseline 25 cm 5/31   Time 6   Period Weeks   Status New   PT LONG TERM GOAL #5   Title Pt will be able to ambulate for 30 min without AD in order to ambulate around  community by 7/12   Baseline on  crutch at this time   Time 6   Period Weeks   Status New               Plan - 03/30/16 1135    Clinical Impression Statement Pt presents to PT for re-evaluation today following hardware removal from ankle. complains of medial ankle pain and difficulty walking. pt was instructed to ambulate with SPC due to tendency to lean into crutch which created a poor gait pattern. Will continue to benefit from skilled PT in order to improve strength and ROM to functional levels and reach functional LTG.    Rehab Potential Good   PT Frequency 2x / week   PT Duration 6 weeks   PT Treatment/Interventions ADLs/Self Care Home Management;Canalith Repostioning;Cryotherapy;Iontophoresis 4mg /ml Dexamethasone;Moist Heat;Stair training;Gait training;DME Instruction;Neuromuscular re-education;Patient/family education;Functional mobility training;Therapeutic activities;Therapeutic exercise;Manual techniques;Balance training;Vestibular;Taping;Dry needling;Passive range of motion;Scar mobilization;Electrical Stimulation;Ultrasound;Vasopneumatic Device   PT Next Visit Plan hip strengthening, ankle ROM and strengthening, gait training   PT Home Exercise Plan clamshells, toe yoga, toe extension stretch, seated HSS   Consulted and Agree with Plan of Care Patient      Patient will benefit from skilled therapeutic intervention in order to improve the following deficits and impairments:  Abnormal gait, Decreased activity tolerance, Decreased balance, Decreased mobility, Decreased strength, Increased edema, Decreased knowledge of use of DME, Decreased endurance, Decreased safety awareness, Increased muscle spasms, Difficulty walking, Decreased range of motion, Pain, Impaired perceived functional ability, Impaired flexibility  Visit Diagnosis: Difficulty in walking - Plan: PT plan of care cert/re-cert  Muscle weakness (generalized) - Plan: PT plan of care cert/re-cert  Left ankle pain -  Plan: PT plan of care cert/re-cert  Localized edema - Plan: PT plan of care cert/re-cert     Problem List Patient Active Problem List   Diagnosis Date Noted  . Bimalleolar fracture of left ankle 11/13/2015  . S/P ORIF (open reduction internal fixation) fracture 11/13/2015  . Healthcare maintenance 10/08/2015  . Postmenopausal bleeding 10/08/2015  . Pain in joint, ankle and foot 10/08/2015  . Headache disorder 10/08/2015  . Atypical chest pain 10/08/2015  . Essential hypertension 10/08/2015  . Neck pain on right side 08/12/2015  . Grief reaction 03/09/2015  . Midline thoracic back pain 12/11/2014  . Depression (emotion) 08/12/2014  . Dyslipidemia 08/12/2014  . Headache(784.0) 07/17/2014  . UTI (urinary tract infection) 04/16/2014  . Pap smear for cervical cancer screening 04/16/2014  . Cystocele with rectocele 04/16/2014  . Hyperlipidemia LDL goal < 100 01/22/2014  . Breast lump on left side at 12 o'clock position 10/22/2013  . Chronic constipation 10/09/2013  . Bone lesion 10/04/2013  . Mammogram abnormal 10/04/2013  . Abdominal pain, unspecified site 10/04/2013  . Unspecified essential hypertension 06/10/2013  . Chest pain, unspecified 06/10/2013  . Chronic throat pain 06/10/2013  . Back pain 11/14/2011  . Obesity (BMI 30-39.9) 11/14/2011  . GERD (gastroesophageal reflux disease) 09/19/2011  . Calculus of gallbladder with acute and chronic cholecystitis without obstruction, s/p lap chole 27Dec2012 09/19/2011  . Hemorrhoids, internal, with bleeding 09/19/2011  . ANEMIA-IRON DEFICIENCY 03/19/2009  . CHEST PAIN, ATYPICAL 03/19/2009    Malosi Hemstreet C. Asjia Berrios PT, DPT 03/30/2016 11:43 AM   [facility] [address] [place], [place], [address] Phone: [phone]   Fax:  [phone]  Name: Susan Lopez MRN: [id number] Date of Birth: 05-28-1961

## 2016-04-04 ENCOUNTER — Ambulatory Visit: Payer: No Typology Code available for payment source | Attending: Orthopaedic Surgery | Admitting: Physical Therapy

## 2016-04-04 ENCOUNTER — Encounter: Payer: Self-pay | Admitting: Physical Therapy

## 2016-04-04 DIAGNOSIS — R600 Localized edema: Secondary | ICD-10-CM | POA: Insufficient documentation

## 2016-04-04 DIAGNOSIS — M25572 Pain in left ankle and joints of left foot: Secondary | ICD-10-CM

## 2016-04-04 DIAGNOSIS — M6281 Muscle weakness (generalized): Secondary | ICD-10-CM

## 2016-04-04 DIAGNOSIS — R262 Difficulty in walking, not elsewhere classified: Secondary | ICD-10-CM

## 2016-04-04 NOTE — Therapy (Signed)
[place], [place], [address] Phone: [phone]   Fax:  [phone]  Physical Therapy Treatment  Patient Details  Name: Susan Lopez MRN: [id number] Date of Birth: Mar 12, 1961 Referring Provider: Frankey Shown  Encounter Date: 04/04/2016      PT End of Session - 04/04/16 0946    Visit Number 17   Number of Visits 28   Date for PT Re-Evaluation 05/13/16   Authorization Type CAFA exp 05/29/2016   PT Start Time 0847   PT Stop Time 0941   PT Time Calculation (min) 54 min   Activity Tolerance Patient tolerated treatment well;No increased pain   Behavior During Therapy [facility] for tasks assessed/performed      Past Medical History  Diagnosis Date  . Iron deficiency anemia   . Hypertension   . Arthritis   . Gallstones   . Hemorrhoids, external 10-21-11    occ. bothersome  . Calculus of gallbladder with acute and chronic cholecystitis without obstruction, s/p lap chole 27Dec2012 09/19/2011  . Bone lesion 10/04/2013  . Mammogram abnormal 10/04/2013  . Complication of anesthesia   . Depression   . Anxiety   . PONV (postoperative nausea and vomiting)     Past Surgical History  Procedure Laterality Date  . Cesarean section  1985, 1992  . Cholecystectomy  10/27/2011    Procedure: LAPAROSCOPIC CHOLECYSTECTOMY WITH INTRAOPERATIVE CHOLANGIOGRAM;  Surgeon: Adin Hector, MD;  Location: [facility];  Service: General;  Laterality: N/A;  Laparoscopic Chole w/ IOC Single Site  . Eye surgery  1`12-21-10    bil. for tissue growth-laser surgery  . Orif ankle fracture Left 11/13/2015    Procedure: OPEN REDUCTION INTERNAL FIXATION (ORIF) LEFT BIMALLEOLAR ANKLE FRACTURE;  Surgeon: Leandrew Koyanagi, MD;  Location: [place];  Service: Orthopedics;  Laterality: Left;  . Hardware removal Left 03/16/2016    Procedure: LEFT ANKLE HARDWARE REMOVAL;  Surgeon: Leandrew Koyanagi, MD;  Location: [place];  Service: Orthopedics;   Laterality: Left;    There were no vitals filed for this visit.      Subjective Assessment - 04/04/16 0852    Subjective Pt reports her L leg has been feeling weak since the surgery and is afraid her leg will give out beneath her. Reported wearing tennis shoes is uncomfortable  due to foot swellling.    Patient is accompained by: Interpreter   Currently in Pain? Yes   Pain Score 4    Pain Location Ankle   Pain Orientation Left   Pain Descriptors / Indicators Sore                         [facility] Adult PT Treatment/Exercise - 04/04/16 0001    Ambulation/Gait   Assistive device Straight cane   Knee/Hip Exercises: Stretches   Passive Hamstring Stretch 2 reps;30 seconds   Passive Hamstring Stretch Limitations seated EOB   Quad Stretch 2 reps;30 seconds   Knee/Hip Exercises: Aerobic   Nustep L3 5 min   Knee/Hip Exercises: Standing   Other Standing Knee Exercises retro walking 2x63ft   Knee/Hip Exercises: Seated   Long Arc Quad 15 reps   Long Arc Quad Weight 3 lbs.   Knee/Hip Exercises: Supine   Bridges Limitations 20   Straight Leg Raises 20 reps;10 reps   Knee/Hip Exercises: Sidelying   Clams 30   Cryotherapy   Number Minutes Cryotherapy 10 Minutes   Cryotherapy Location Ankle  Type of Cryotherapy Ice pack   Ankle Exercises: Seated   Ankle Circles/Pumps Other reps (comment)  1 min   Other Seated Ankle Exercises toe yoga x10   Ankle Exercises: Stretches   Other Stretch rocker board DF stretch 2 min; rocker board pro/supination 2 min   Ankle Exercises: Standing   Heel Raises 10 reps  heel/toe raises alt                PT Education - 04/04/16 0946    Education provided Yes   Education Details footwear, importance of quad strength, noticing weakness with decreasing AD use, HEP, exercise form/rationale   Person(s) Educated Patient   Methods Explanation;Demonstration;Tactile cues;Verbal cues   Comprehension Verbalized understanding;Returned  demonstration;Verbal cues required;Tactile cues required;Need further instruction          PT Short Term Goals - 02/08/16 0909    PT SHORT TERM GOAL #1   Title Pt will be I with initial HEP for continued strengthening and mobility by 02/06/16.   Time 4   Period Weeks   Status Achieved   PT SHORT TERM GOAL #2   Title L ankle DF AROM will improve to 5 degrees pain-free in order to stand for 10 mins without pain.    Time 4   Period Weeks   Status Achieved   PT SHORT TERM GOAL #3   Title Pt will demonstrate proper TTWB with RW for all gait 100 feet, indep by 02/06/16.     Time 4   Period Weeks   Status Achieved   PT SHORT TERM GOAL #4   Title Pt will no longer complain of dizziness with sit to supine transfers by 02/05/16.   Time 4   Period Weeks   Status Achieved           PT Long Term Goals - 03/30/16 0818    PT LONG TERM GOAL #1   Title L ankle PF strength will improve to 3+/5 in order to perform stairs pain free by  7/12   Baseline 2+/5, able to resist MMT but unable to raise into PF in standing   Time 6   Period Weeks   Status New   PT LONG TERM GOAL #2   Title L ankle DF ROM will improve to 15 degrees in order for  pt to walk for 30 mins pain- free with SPC by 7/12   Baseline 8 deg 5/31   Time 6   Period Weeks   Status New   PT LONG TERM GOAL #3   Title Pt will tolerate SLS on L LE ( once FWB by MD)  for 30 secs to improve functional mobility tolerance and decrease risk for falls by 7/12   Baseline unable on 5/31   Time 6   Period Weeks   Status New   PT LONG TERM GOAL #4   Title L ankle malleoli circumference will improve to 24cm by 7/12   Baseline 25 cm 5/31   Time 6   Period Weeks   Status New   PT LONG TERM GOAL #5   Title Pt will be able to ambulate for 30 min without AD in order to ambulate around community by 7/12   Baseline on crutch at this time   Time 6   Period Weeks   Status New               Plan - 04/04/16 0947    Clinical  Impression Statement Pt demo good  ROM in ankle and is improving intrisic strength, demonstrating a couple of toe yoga patterns. Was ambulating with SPC today with good pattern and minimal antalgic gait.  Discussed loosening laces on shoes so her foot does not feel so constricted.    PT Treatment/Interventions ADLs/Self Care Home Management;Canalith Repostioning;Cryotherapy;Iontophoresis 4mg /ml Dexamethasone;Moist Heat;Stair training;Gait training;DME Instruction;Neuromuscular re-education;Patient/family education;Functional mobility training;Therapeutic activities;Therapeutic exercise;Manual techniques;Balance training;Vestibular;Taping;Dry needling;Passive range of motion;Scar mobilization;Electrical Stimulation;Ultrasound;Vasopneumatic Device   PT Next Visit Plan hip strengthening, ankle ROM and strengthening, gait training   PT Home Exercise Plan clamshells, toe yoga, toe extension stretch, seated HSS, seated LAQ   Consulted and Agree with Plan of Care Patient      Patient will benefit from skilled therapeutic intervention in order to improve the following deficits and impairments:  Abnormal gait, Decreased activity tolerance, Decreased balance, Decreased mobility, Decreased strength, Increased edema, Decreased knowledge of use of DME, Decreased endurance, Decreased safety awareness, Increased muscle spasms, Difficulty walking, Decreased range of motion, Pain, Impaired perceived functional ability, Impaired flexibility  Visit Diagnosis: Difficulty in walking  Muscle weakness (generalized)  Left ankle pain     Problem List Patient Active Problem List   Diagnosis Date Noted  . Bimalleolar fracture of left ankle 11/13/2015  . S/P ORIF (open reduction internal fixation) fracture 11/13/2015  . Healthcare maintenance 10/08/2015  . Postmenopausal bleeding 10/08/2015  . Pain in joint, ankle and foot 10/08/2015  . Headache disorder 10/08/2015  . Atypical chest pain 10/08/2015  . Essential  hypertension 10/08/2015  . Neck pain on right side 08/12/2015  . Grief reaction 03/09/2015  . Midline thoracic back pain 12/11/2014  . Depression (emotion) 08/12/2014  . Dyslipidemia 08/12/2014  . Headache(784.0) 07/17/2014  . UTI (urinary tract infection) 04/16/2014  . Pap smear for cervical cancer screening 04/16/2014  . Cystocele with rectocele 04/16/2014  . Hyperlipidemia LDL goal < 100 01/22/2014  . Breast lump on left side at 12 o'clock position 10/22/2013  . Chronic constipation 10/09/2013  . Bone lesion 10/04/2013  . Mammogram abnormal 10/04/2013  . Abdominal pain, unspecified site 10/04/2013  . Unspecified essential hypertension 06/10/2013  . Chest pain, unspecified 06/10/2013  . Chronic throat pain 06/10/2013  . Back pain 11/14/2011  . Obesity (BMI 30-39.9) 11/14/2011  . GERD (gastroesophageal reflux disease) 09/19/2011  . Calculus of gallbladder with acute and chronic cholecystitis without obstruction, s/p lap chole 27Dec2012 09/19/2011  . Hemorrhoids, internal, with bleeding 09/19/2011  . ANEMIA-IRON DEFICIENCY 03/19/2009  . CHEST PAIN, ATYPICAL 03/19/2009   Shiro Ellerman C. Puja Caffey PT, DPT 04/04/2016 9:56 AM   [facility] [address] [place], [place], [address] Phone: [phone]   Fax:  [phone]  Name: Susan Lopez MRN: [id number] Date of Birth: 30-Sep-1961

## 2016-04-05 ENCOUNTER — Telehealth: Payer: Self-pay | Admitting: *Deleted

## 2016-04-05 NOTE — Telephone Encounter (Signed)
Medical Assistant used [employer] to contact patient.  Interpreter Name: Interpreter #: [phone]   Patient verified DOB Patient is aware of pelvic US showing uterine fibroids and polyps. Patient is aware of gyn referral being placed again. Patient received [facility] about [facility]. No further questions at this time.

## 2016-04-05 NOTE — Telephone Encounter (Signed)
-----   Message from Tresa Garter, MD sent at 03/23/2016 12:19 PM EDT ----- Please inform patient that her pelvic ultrasound show uterine fibroids and polyps, will refer her back to her gynecologist for further management because of continued postmenopausal vaginal bleeding. Referral ordered.

## 2016-04-11 ENCOUNTER — Ambulatory Visit: Payer: No Typology Code available for payment source | Admitting: Physical Therapy

## 2016-04-15 ENCOUNTER — Ambulatory Visit: Payer: No Typology Code available for payment source | Admitting: Physical Therapy

## 2016-04-15 DIAGNOSIS — M25572 Pain in left ankle and joints of left foot: Secondary | ICD-10-CM

## 2016-04-15 DIAGNOSIS — R262 Difficulty in walking, not elsewhere classified: Secondary | ICD-10-CM

## 2016-04-15 DIAGNOSIS — M6281 Muscle weakness (generalized): Secondary | ICD-10-CM

## 2016-04-15 DIAGNOSIS — R600 Localized edema: Secondary | ICD-10-CM

## 2016-04-15 NOTE — Patient Instructions (Signed)
Tandem Stance    Con el pie derecho al frente del izquierdo, y el taln tocando los dedos del otro pie "pies alineados". Prese sobre el [place] del Pie con ambos pies. Mantenga el equilibrio en esta posicin durante _60__ segundos. Realice el ejercicio con el pie derecho  adelante del izquierdo  .

## 2016-04-15 NOTE — Therapy (Signed)
[place], [place], [address] Phone: [phone]   Fax:  [phone]  Physical Therapy Treatment  Patient Details  Name: Susan Lopez MRN: [id number] Date of Birth: Aug 12, 1961 Referring Provider: Frankey Shown  Encounter Date: 04/15/2016      PT End of Session - 04/15/16 1022    Visit Number 18   Number of Visits 28   Date for PT Re-Evaluation 05/13/16   Authorization Type CAFA exp 05/29/2016   PT Start Time 1020   PT Stop Time 1108   PT Time Calculation (min) 48 min      Past Medical History  Diagnosis Date  . Iron deficiency anemia   . Hypertension   . Arthritis   . Gallstones   . Hemorrhoids, external 10-21-11    occ. bothersome  . Calculus of gallbladder with acute and chronic cholecystitis without obstruction, s/p lap chole 27Dec2012 09/19/2011  . Bone lesion 10/04/2013  . Mammogram abnormal 10/04/2013  . Complication of anesthesia   . Depression   . Anxiety   . PONV (postoperative nausea and vomiting)     Past Surgical History  Procedure Laterality Date  . Cesarean section  1985, 1992  . Cholecystectomy  10/27/2011    Procedure: LAPAROSCOPIC CHOLECYSTECTOMY WITH INTRAOPERATIVE CHOLANGIOGRAM;  Surgeon: Adin Hector, MD;  Location: [facility];  Service: General;  Laterality: N/A;  Laparoscopic Chole w/ IOC Single Site  . Eye surgery  1`12-21-10    bil. for tissue growth-laser surgery  . Orif ankle fracture Left 11/13/2015    Procedure: OPEN REDUCTION INTERNAL FIXATION (ORIF) LEFT BIMALLEOLAR ANKLE FRACTURE;  Surgeon: Leandrew Koyanagi, MD;  Location: [place];  Service: Orthopedics;  Laterality: Left;  . Hardware removal Left 03/16/2016    Procedure: LEFT ANKLE HARDWARE REMOVAL;  Surgeon: Leandrew Koyanagi, MD;  Location: [place];  Service: Orthopedics;  Laterality: Left;    There were no vitals filed for this visit.      Subjective Assessment - 04/15/16 1022    Subjective  My knee feels more stable since I have been doing the exercises.    Patient is accompained by: Interpreter  [employer] [employer]   Currently in Pain? Yes   Pain Score 3    Pain Location Ankle   Pain Orientation Medial;Lateral                         [facility] Adult PT Treatment/Exercise - 04/15/16 0001    High Level Balance   High Level Balance Activities Side stepping;Backward walking;Tandem walking   High Level Balance Comments also SLS 3 sec best, tandem stand 1 minute   Knee/Hip Exercises: Standing   Heel Raises 20 reps   Heel Raises Limitations 5 reps on single leg, pt reports weakness    Cryotherapy   Number Minutes Cryotherapy 10 Minutes   Cryotherapy Location Ankle   Type of Cryotherapy Ice pack   Ankle Exercises: Stretches   Slant Board Stretch 30 seconds;2 reps  feels stretch posterior thigh, knee, calf                PT Education - 04/15/16 1123    Education provided Yes   Education Details Tandem stand with left foot back   Person(s) Educated Patient   Methods Explanation   Comprehension Verbalized understanding          PT Short Term Goals - 02/08/16 0909    PT SHORT  TERM GOAL #1   Title Pt will be I with initial HEP for continued strengthening and mobility by 02/06/16.   Time 4   Period Weeks   Status Achieved   PT SHORT TERM GOAL #2   Title L ankle DF AROM will improve to 5 degrees pain-free in order to stand for 10 mins without pain.    Time 4   Period Weeks   Status Achieved   PT SHORT TERM GOAL #3   Title Pt will demonstrate proper TTWB with RW for all gait 100 feet, indep by 02/06/16.     Time 4   Period Weeks   Status Achieved   PT SHORT TERM GOAL #4   Title Pt will no longer complain of dizziness with sit to supine transfers by 02/05/16.   Time 4   Period Weeks   Status Achieved           PT Long Term Goals - 03/30/16 0818    PT LONG TERM GOAL #1   Title L ankle PF strength will improve to 3+/5 in order  to perform stairs pain free by  7/12   Baseline 2+/5, able to resist MMT but unable to raise into PF in standing   Time 6   Period Weeks   Status New   PT LONG TERM GOAL #2   Title L ankle DF ROM will improve to 15 degrees in order for  pt to walk for 30 mins pain- free with SPC by 7/12   Baseline 8 deg 5/31   Time 6   Period Weeks   Status New   PT LONG TERM GOAL #3   Title Pt will tolerate SLS on L LE ( once FWB by MD)  for 30 secs to improve functional mobility tolerance and decrease risk for falls by 7/12   Baseline unable on 5/31   Time 6   Period Weeks   Status New   PT LONG TERM GOAL #4   Title L ankle malleoli circumference will improve to 24cm by 7/12   Baseline 25 cm 5/31   Time 6   Period Weeks   Status New   PT LONG TERM GOAL #5   Title Pt will be able to ambulate for 30 min without AD in order to ambulate around community by 7/12   Baseline on crutch at this time   Time 6   Period Weeks   Status New               Plan - 04/15/16 1123    Clinical Impression Statement Instructed pt in standing balance activites. SLS 3 second best. Tandem stand 1 minute with reports of weakness an fatigue in ankle. Pt able to perform 5 single heel raises on left with minimal pain.    PT Next Visit Plan hip strengthening, ankle ROM and strengthening, gait training- check height of SPC, reprint tandem balance for HEP      Patient will benefit from skilled therapeutic intervention in order to improve the following deficits and impairments:  Abnormal gait, Decreased activity tolerance, Decreased balance, Decreased mobility, Decreased strength, Increased edema, Decreased knowledge of use of DME, Decreased endurance, Decreased safety awareness, Increased muscle spasms, Difficulty walking, Decreased range of motion, Pain, Impaired perceived functional ability, Impaired flexibility  Visit Diagnosis: Difficulty in walking  Muscle weakness (generalized)  Left ankle  pain  Localized edema     Problem List Patient Active Problem List   Diagnosis Date Noted  .  Bimalleolar fracture of left ankle 11/13/2015  . S/P ORIF (open reduction internal fixation) fracture 11/13/2015  . Healthcare maintenance 10/08/2015  . Postmenopausal bleeding 10/08/2015  . Pain in joint, ankle and foot 10/08/2015  . Headache disorder 10/08/2015  . Atypical chest pain 10/08/2015  . Essential hypertension 10/08/2015  . Neck pain on right side 08/12/2015  . Grief reaction 03/09/2015  . Midline thoracic back pain 12/11/2014  . Depression (emotion) 08/12/2014  . Dyslipidemia 08/12/2014  . Headache(784.0) 07/17/2014  . UTI (urinary tract infection) 04/16/2014  . Pap smear for cervical cancer screening 04/16/2014  . Cystocele with rectocele 04/16/2014  . Hyperlipidemia LDL goal < 100 01/22/2014  . Breast lump on left side at 12 o'clock position 10/22/2013  . Chronic constipation 10/09/2013  . Bone lesion 10/04/2013  . Mammogram abnormal 10/04/2013  . Abdominal pain, unspecified site 10/04/2013  . Unspecified essential hypertension 06/10/2013  . Chest pain, unspecified 06/10/2013  . Chronic throat pain 06/10/2013  . Back pain 11/14/2011  . Obesity (BMI 30-39.9) 11/14/2011  . GERD (gastroesophageal reflux disease) 09/19/2011  . Calculus of gallbladder with acute and chronic cholecystitis without obstruction, s/p lap chole 27Dec2012 09/19/2011  . Hemorrhoids, internal, with bleeding 09/19/2011  . ANEMIA-IRON DEFICIENCY 03/19/2009  . CHEST PAIN, ATYPICAL 03/19/2009    Dorene Ar, PTA 04/15/2016, 11:38 AM  [place], [place], [address] Phone: [phone]   Fax:  [phone]  Name: Susan Lopez MRN: [id number] Date of Birth: 08/20/1961

## 2016-04-18 ENCOUNTER — Ambulatory Visit: Payer: No Typology Code available for payment source | Admitting: Physical Therapy

## 2016-04-22 ENCOUNTER — Encounter: Payer: Self-pay | Admitting: Physical Therapy

## 2016-04-22 ENCOUNTER — Encounter: Payer: No Typology Code available for payment source | Admitting: Physical Therapy

## 2016-04-25 ENCOUNTER — Encounter: Payer: No Typology Code available for payment source | Admitting: Physical Therapy

## 2016-04-29 ENCOUNTER — Ambulatory Visit: Payer: No Typology Code available for payment source | Admitting: Physical Therapy

## 2016-04-29 DIAGNOSIS — R262 Difficulty in walking, not elsewhere classified: Secondary | ICD-10-CM

## 2016-04-29 DIAGNOSIS — M6281 Muscle weakness (generalized): Secondary | ICD-10-CM

## 2016-04-29 DIAGNOSIS — M25572 Pain in left ankle and joints of left foot: Secondary | ICD-10-CM

## 2016-04-29 DIAGNOSIS — R600 Localized edema: Secondary | ICD-10-CM

## 2016-04-29 NOTE — Patient Instructions (Signed)
PLANTARFLEXION STRENGTHENING:   Balancing ActANKLE: Plantarflexion, Bilateral - Standing   Stand with upright posture. Raise heels up as high as possible. SHIFT WEIGHT TO LEFT AND LOWER DOWN SLOWLY. __10_ reps per set, 2___ sets per day, _2__ days per week Hold onto a support.     SINGLE LIMB STANCE   Stance: single leg on floor. Raise leg. Hold __30_ seconds.  __3_ reps per set, __2_ sets per day, _7__ days per week  Copyright  VHI. All rights reserved.

## 2016-04-29 NOTE — Therapy (Signed)
[place], [place], [address] Phone: [phone]   Fax:  [phone]  Physical Therapy Treatment  Patient Details  Name: Susan Lopez MRN: [id number] Date of Birth: 10-28-1961 Referring Provider: Frankey Shown  Encounter Date: 04/29/2016      PT End of Session - 04/29/16 0810    Visit Number 19   Number of Visits 28   Date for PT Re-Evaluation 05/13/16   Authorization Type CAFA exp 05/29/2016   PT Start Time 0807   PT Stop Time 0845   PT Time Calculation (min) 38 min      Past Medical History  Diagnosis Date  . Iron deficiency anemia   . Hypertension   . Arthritis   . Gallstones   . Hemorrhoids, external 10-21-11    occ. bothersome  . Calculus of gallbladder with acute and chronic cholecystitis without obstruction, s/p lap chole 27Dec2012 09/19/2011  . Bone lesion 10/04/2013  . Mammogram abnormal 10/04/2013  . Complication of anesthesia   . Depression   . Anxiety   . PONV (postoperative nausea and vomiting)     Past Surgical History  Procedure Laterality Date  . Cesarean section  1985, 1992  . Cholecystectomy  10/27/2011    Procedure: LAPAROSCOPIC CHOLECYSTECTOMY WITH INTRAOPERATIVE CHOLANGIOGRAM;  Surgeon: Adin Hector, MD;  Location: [facility];  Service: General;  Laterality: N/A;  Laparoscopic Chole w/ IOC Single Site  . Eye surgery  1`12-21-10    bil. for tissue growth-laser surgery  . Orif ankle fracture Left 11/13/2015    Procedure: OPEN REDUCTION INTERNAL FIXATION (ORIF) LEFT BIMALLEOLAR ANKLE FRACTURE;  Surgeon: Leandrew Koyanagi, MD;  Location: [facility];  Service: Orthopedics;  Laterality: Left;  . Hardware removal Left 03/16/2016    Procedure: LEFT ANKLE HARDWARE REMOVAL;  Surgeon: Leandrew Koyanagi, MD;  Location: [facility];  Service: Orthopedics;  Laterality: Left;    There were no vitals filed for this visit.      Subjective Assessment - 04/29/16 0808    Subjective  Up to 8/10 inside ankle especially if I am stiff or have been sitting awhile   Patient is accompained by: Interpreter  Marlene [place] [employer]   Currently in Pain? No/denies   Aggravating Factors  walking and doing DF exercises, when she first wakes up.    Pain Relieving Factors ice, elevation            [facility] PT Assessment - 04/29/16 0001    Circumferential Edema   Circumferential - Left  25 cm   Figure 8 Edema   Figure 8 - Left  48.75 cm   AROM   Left Ankle Dorsiflexion 8   Left Ankle Plantar Flexion 45   Strength   Left Hip Flexion 4+/5   Left Knee Flexion 5/5   Left Knee Extension 5/5   Left Ankle Dorsiflexion 5/5   Left Ankle Inversion 4/5   Left Ankle Eversion 4/5                     [facility] Adult PT Treatment/Exercise - 04/29/16 0001    Knee/Hip Exercises: Stretches   Piriformis Stretch 3 reps;30 seconds   Piriformis Stretch Limitations seated   Ankle Exercises: Standing   SLS 8 sec best   Heel Raises 10 reps  Left eccentric   Other Standing Ankle Exercises tandem gait 20 ft , tandem stance x 1 minute   Ankle Exercises: Stretches   Set designer  30 seconds;2 reps  feels stretch posterior thigh, knee, calf                PT Education - 04/29/16 1022    Education provided Yes   Education Details SLS and heel raises with weigt shift   Person(s) Educated Patient   Methods Explanation;Handout   Comprehension Verbalized understanding          PT Short Term Goals - 02/08/16 0909    PT SHORT TERM GOAL #1   Title Pt will be I with initial HEP for continued strengthening and mobility by 02/06/16.   Time 4   Period Weeks   Status Achieved   PT SHORT TERM GOAL #2   Title L ankle DF AROM will improve to 5 degrees pain-free in order to stand for 10 mins without pain.    Time 4   Period Weeks   Status Achieved   PT SHORT TERM GOAL #3   Title Pt will demonstrate proper TTWB with RW for all gait 100 feet, indep by 02/06/16.      Time 4   Period Weeks   Status Achieved   PT SHORT TERM GOAL #4   Title Pt will no longer complain of dizziness with sit to supine transfers by 02/05/16.   Time 4   Period Weeks   Status Achieved           PT Long Term Goals - 04/29/16 0908    PT LONG TERM GOAL #1   Title L ankle PF strength will improve to 3+/5 in order to perform stairs pain free by  7/12   Baseline small small heel raise in standing    Time 6   Period Weeks   Status On-going   PT LONG TERM GOAL #2   Title L ankle DF ROM will improve to 15 degrees in order for  pt to walk for 30 mins pain- free with SPC by 7/12   Baseline 8 deg 6/30   Time 6   Period Weeks   Status On-going   PT LONG TERM GOAL #3   Title Pt will tolerate SLS on L LE ( once FWB by MD)  for 30 secs to improve functional mobility tolerance and decrease risk for falls by 7/12   Baseline 8 sec 6/30   Time 6   Period Weeks   Status On-going   PT LONG TERM GOAL #4   Title L ankle malleoli circumference will improve to 24cm by 7/12   Baseline 25 cm 6/30   Time 6   Period Weeks   Status On-going   PT LONG TERM GOAL #5   Title Pt will be able to ambulate for 30 min without AD in order to ambulate around community by 7/12   Baseline 20 minutes spc   Time 6   Period Weeks   Status On-going               Plan - 04/29/16 0827    Clinical Impression Statement Pt able abe to walk 20 minutes without much pain. She reports minimal pain with stairs. DF is 8 degrees. She is able to perfrom a small heel raise on the left. all other motions MMT has improved to 4/5. She no longer feels uncertain about her knee strength and MMT has improved as well. SLS time is improving. Progressing toward goals.    PT Next Visit Plan reprint last 2 HEP, check hip strength-pt has h/o left sciatica, SLS and  PF       Patient will benefit from skilled therapeutic intervention in order to improve the following deficits and impairments:  Abnormal gait, Decreased  activity tolerance, Decreased balance, Decreased mobility, Decreased strength, Increased edema, Decreased knowledge of use of DME, Decreased endurance, Decreased safety awareness, Increased muscle spasms, Difficulty walking, Decreased range of motion, Pain, Impaired perceived functional ability, Impaired flexibility  Visit Diagnosis: Difficulty in walking  Muscle weakness (generalized)  Localized edema  Left ankle pain     Problem List Patient Active Problem List   Diagnosis Date Noted  . Bimalleolar fracture of left ankle 11/13/2015  . S/P ORIF (open reduction internal fixation) fracture 11/13/2015  . Healthcare maintenance 10/08/2015  . Postmenopausal bleeding 10/08/2015  . Pain in joint, ankle and foot 10/08/2015  . Headache disorder 10/08/2015  . Atypical chest pain 10/08/2015  . Essential hypertension 10/08/2015  . Neck pain on right side 08/12/2015  . Grief reaction 03/09/2015  . Midline thoracic back pain 12/11/2014  . Depression (emotion) 08/12/2014  . Dyslipidemia 08/12/2014  . Headache(784.0) 07/17/2014  . UTI (urinary tract infection) 04/16/2014  . Pap smear for cervical cancer screening 04/16/2014  . Cystocele with rectocele 04/16/2014  . Hyperlipidemia LDL goal < 100 01/22/2014  . Breast lump on left side at 12 o'clock position 10/22/2013  . Chronic constipation 10/09/2013  . Bone lesion 10/04/2013  . Mammogram abnormal 10/04/2013  . Abdominal pain, unspecified site 10/04/2013  . Unspecified essential hypertension 06/10/2013  . Chest pain, unspecified 06/10/2013  . Chronic throat pain 06/10/2013  . Back pain 11/14/2011  . Obesity (BMI 30-39.9) 11/14/2011  . GERD (gastroesophageal reflux disease) 09/19/2011  . Calculus of gallbladder with acute and chronic cholecystitis without obstruction, s/p lap chole 27Dec2012 09/19/2011  . Hemorrhoids, internal, with bleeding 09/19/2011  . ANEMIA-IRON DEFICIENCY 03/19/2009  . CHEST PAIN, ATYPICAL 03/19/2009     Dorene Ar, PTA 04/29/2016, 10:27 AM  [place], [place], [address] Phone: [phone]   Fax:  [phone]  Name: Orletta Solimine MRN: [id number] Date of Birth: 02/18/1961

## 2016-05-02 ENCOUNTER — Ambulatory Visit: Payer: No Typology Code available for payment source | Admitting: Physical Therapy

## 2016-05-06 ENCOUNTER — Ambulatory Visit: Payer: No Typology Code available for payment source | Attending: Orthopaedic Surgery | Admitting: Physical Therapy

## 2016-05-06 DIAGNOSIS — M6281 Muscle weakness (generalized): Secondary | ICD-10-CM | POA: Insufficient documentation

## 2016-05-06 DIAGNOSIS — M25572 Pain in left ankle and joints of left foot: Secondary | ICD-10-CM | POA: Insufficient documentation

## 2016-05-06 DIAGNOSIS — R262 Difficulty in walking, not elsewhere classified: Secondary | ICD-10-CM | POA: Insufficient documentation

## 2016-05-06 DIAGNOSIS — R600 Localized edema: Secondary | ICD-10-CM | POA: Insufficient documentation

## 2016-05-06 NOTE — Therapy (Signed)
[place], [place], [address] Phone: [phone]   Fax:  [phone]  Physical Therapy Treatment  Patient Details  Name: Susan Lopez MRN: [id number] Date of Birth: [birth year] Referring Provider: Frankey Shown  Encounter Date: 05/06/2016      PT End of Session - 05/06/16 0806    Visit Number 20   Number of Visits 28   Date for PT Re-Evaluation 05/13/16   PT Start Time 0802   PT Stop Time 0840   PT Time Calculation (min) 38 min      Past Medical History  Diagnosis Date  . Iron deficiency anemia   . Hypertension   . Arthritis   . Gallstones   . Hemorrhoids, external 10-21-11    occ. bothersome  . Calculus of gallbladder with acute and chronic cholecystitis without obstruction, s/p lap chole 27Dec2012 09/19/2011  . Bone lesion 10/04/2013  . Mammogram abnormal 10/04/2013  . Complication of anesthesia   . Depression   . Anxiety   . PONV (postoperative nausea and vomiting)     Past Surgical History  Procedure Laterality Date  . Cesarean section  1985, 1992  . Cholecystectomy  10/27/2011    Procedure: LAPAROSCOPIC CHOLECYSTECTOMY WITH INTRAOPERATIVE CHOLANGIOGRAM;  Surgeon: Adin Hector, MD;  Location: [facility];  Service: General;  Laterality: N/A;  Laparoscopic Chole w/ IOC Single Site  . Eye surgery  1`12-21-10    bil. for tissue growth-laser surgery  . Orif ankle fracture Left 11/13/2015    Procedure: OPEN REDUCTION INTERNAL FIXATION (ORIF) LEFT BIMALLEOLAR ANKLE FRACTURE;  Surgeon: Leandrew Koyanagi, MD;  Location: [facility];  Service: Orthopedics;  Laterality: Left;  . Hardware removal Left 03/16/2016    Procedure: LEFT ANKLE HARDWARE REMOVAL;  Surgeon: Leandrew Koyanagi, MD;  Location: [facility];  Service: Orthopedics;  Laterality: Left;    There were no vitals filed for this visit.      Subjective Assessment - 05/06/16 0805    Patient is accompained by: Interpreter  Marlene  [place]   Currently in Pain? No/denies            [facility] PT Assessment - 05/06/16 0001    Strength   Left Ankle Plantar Flexion 5/5  25/25                     [facility] Adult PT Treatment/Exercise - 05/06/16 0001    Knee/Hip Exercises: Aerobic   Recumbent Bike L2 x 5 minutes   Knee/Hip Exercises: Standing   SLS with Vectors on foam pad with 1 finger support    Ankle Exercises: Standing   SLS 23 seconds best on left   Rebounder red ball 3 toss best   Other Standing Ankle Exercises tandem gait 40 ft , tandem stance x 1 minute   Ankle Exercises: Stretches   Slant Board Stretch 30 seconds;2 reps  feels stretch posterior thigh, knee, calf                  PT Short Term Goals - 02/08/16 0909    PT SHORT TERM GOAL #1   Title Pt will be I with initial HEP for continued strengthening and mobility by 02/06/16.   Time 4   Period Weeks   Status Achieved   PT SHORT TERM GOAL #2   Title L ankle DF AROM will improve to 5 degrees pain-free in order to stand for 10 mins without pain.    Time 4  Period Weeks   Status Achieved   PT SHORT TERM GOAL #3   Title Pt will demonstrate proper TTWB with RW for all gait 100 feet, indep by 02/06/16.     Time 4   Period Weeks   Status Achieved   PT SHORT TERM GOAL #4   Title Pt will no longer complain of dizziness with sit to supine transfers by 02/05/16.   Time 4   Period Weeks   Status Achieved           PT Long Term Goals - 05/06/16 3810    PT LONG TERM GOAL #1   Title L ankle PF strength will improve to 3+/5 in order to perform stairs pain free by  7/12   Baseline 25/25   Time 6   Period Weeks   Status Achieved   PT LONG TERM GOAL #2   Title L ankle DF ROM will improve to 15 degrees in order for  pt to walk for 30 mins pain- free with SPC by 7/12   Time 6   Period Weeks   Status On-going   PT LONG TERM GOAL #3   Title Pt will tolerate SLS on L LE ( once FWB by MD)  for 30 secs to improve functional mobility  tolerance and decrease risk for falls by 7/12   Baseline 23 seconds best  05/06/2016   Time 6   Period Weeks   Status On-going   PT LONG TERM GOAL #4   Title L ankle malleoli circumference will improve to 24cm by 7/12   Baseline 25 cm 6/30- interrater reliability? or recorded incorectly at eval?   Time 6   Period Weeks   Status Unable to assess   PT LONG TERM GOAL #5   Title Pt will be able to ambulate for 30 min without AD in order to ambulate around community by 7/12   Baseline 20 minutes spc   Time 6   Period Weeks   Status On-going               Plan - 05/06/16 1751    Clinical Impression Statement Pt demonstrates increased PF strength and is able to perform 25 single heel raises bilateral. Her SLS on LLE is 23 seconds, improved from 8 seconds last visit. She reports she has been perfroming consistent HEP. LTG# 1 MET. She has been walking 20-25 minutes outdoor to park and back with no increased pain using SPC. Encouraged her to begin ambulating in community without [place] beginning with level indoor surfaces.    PT Next Visit Plan check Hip strength, pt has h/o sciatica, ask if pt tried ambulation without SPC, may only need 1-2 more visits.       Patient will benefit from skilled therapeutic intervention in order to improve the following deficits and impairments:  Abnormal gait, Decreased activity tolerance, Decreased balance, Decreased mobility, Decreased strength, Increased edema, Decreased knowledge of use of DME, Decreased endurance, Decreased safety awareness, Increased muscle spasms, Difficulty walking, Decreased range of motion, Pain, Impaired perceived functional ability, Impaired flexibility  Visit Diagnosis: Difficulty in walking  Muscle weakness (generalized)  Localized edema     Problem List Patient Active Problem List   Diagnosis Date Noted  . Bimalleolar fracture of left ankle 11/13/2015  . S/P ORIF (open reduction internal fixation) fracture 11/13/2015   . Healthcare maintenance 10/08/2015  . Postmenopausal bleeding 10/08/2015  . Pain in joint, ankle and foot 10/08/2015  . Headache disorder 10/08/2015  .  Atypical chest pain 10/08/2015  . Essential hypertension 10/08/2015  . Neck pain on right side 08/12/2015  . Grief reaction 03/09/2015  . Midline thoracic back pain 12/11/2014  . Depression (emotion) 08/12/2014  . Dyslipidemia 08/12/2014  . Headache(784.0) 07/17/2014  . UTI (urinary tract infection) 04/16/2014  . Pap smear for cervical cancer screening 04/16/2014  . Cystocele with rectocele 04/16/2014  . Hyperlipidemia LDL goal < 100 01/22/2014  . Breast lump on left side at 12 o'clock position 10/22/2013  . Chronic constipation 10/09/2013  . Bone lesion 10/04/2013  . Mammogram abnormal 10/04/2013  . Abdominal pain, unspecified site 10/04/2013  . Unspecified essential hypertension 06/10/2013  . Chest pain, unspecified 06/10/2013  . Chronic throat pain 06/10/2013  . Back pain 11/14/2011  . Obesity (BMI 30-39.9) 11/14/2011  . GERD (gastroesophageal reflux disease) 09/19/2011  . Calculus of gallbladder with acute and chronic cholecystitis without obstruction, s/p lap chole 27Dec2012 09/19/2011  . Hemorrhoids, internal, with bleeding 09/19/2011  . ANEMIA-IRON DEFICIENCY 03/19/2009  . CHEST PAIN, ATYPICAL 03/19/2009    Dorene Ar, PTA 05/06/2016, 8:42 AM  [facility] [address] [place], [place], [address] Phone: [phone]   Fax:  [phone]  Name: Susan Lopez MRN: [id number] Date of Birth: 1961-07-09

## 2016-05-09 ENCOUNTER — Ambulatory Visit: Payer: No Typology Code available for payment source | Admitting: Physical Therapy

## 2016-05-09 ENCOUNTER — Encounter: Payer: Self-pay | Admitting: Physical Therapy

## 2016-05-09 DIAGNOSIS — M25572 Pain in left ankle and joints of left foot: Secondary | ICD-10-CM

## 2016-05-09 DIAGNOSIS — R262 Difficulty in walking, not elsewhere classified: Secondary | ICD-10-CM

## 2016-05-09 DIAGNOSIS — M6281 Muscle weakness (generalized): Secondary | ICD-10-CM

## 2016-05-09 MED FILL — ATORVASTATIN 20 MG TABLET: 20 | 90 days supply | Qty: 90 | Fill #0

## 2016-05-09 MED FILL — LISINOPRIL 10 MG TABLET: 10 | 90 days supply | Qty: 90 | Fill #0

## 2016-05-09 NOTE — Therapy (Signed)
[place], [place], [address] Phone: [phone]   Fax:  [phone]  Physical Therapy Treatment/Discharge Summary Patient Details  Name: Laurann Mcmorris MRN: [id number] Date of Birth: 04-08-1961 Referring Provider: Frankey Shown  Encounter Date: 05/09/2016      PT End of Session - 05/09/16 0927    Visit Number 21   Number of Visits 28   Date for PT Re-Evaluation 05/13/16   PT Start Time 0845   PT Stop Time 0935   PT Time Calculation (min) 50 min   Activity Tolerance Patient tolerated treatment well   Behavior During Therapy [facility] for tasks assessed/performed      Past Medical History  Diagnosis Date  . Iron deficiency anemia   . Hypertension   . Arthritis   . Gallstones   . Hemorrhoids, external 10-21-11    occ. bothersome  . Calculus of gallbladder with acute and chronic cholecystitis without obstruction, s/p lap chole 27Dec2012 09/19/2011  . Bone lesion 10/04/2013  . Mammogram abnormal 10/04/2013  . Complication of anesthesia   . Depression   . Anxiety   . PONV (postoperative nausea and vomiting)     Past Surgical History  Procedure Laterality Date  . Cesarean section  1985, 1992  . Cholecystectomy  10/27/2011    Procedure: LAPAROSCOPIC CHOLECYSTECTOMY WITH INTRAOPERATIVE CHOLANGIOGRAM;  Surgeon: Adin Hector, MD;  Location: [facility];  Service: General;  Laterality: N/A;  Laparoscopic Chole w/ IOC Single Site  . Eye surgery  1`12-21-10    bil. for tissue growth-laser surgery  . Orif ankle fracture Left 11/13/2015    Procedure: OPEN REDUCTION INTERNAL FIXATION (ORIF) LEFT BIMALLEOLAR ANKLE FRACTURE;  Surgeon: Leandrew Koyanagi, MD;  Location: [facility];  Service: Orthopedics;  Laterality: Left;  . Hardware removal Left 03/16/2016    Procedure: LEFT ANKLE HARDWARE REMOVAL;  Surgeon: Leandrew Koyanagi, MD;  Location: [facility];  Service: Orthopedics;  Laterality: Left;    There were no  vitals filed for this visit.      Subjective Assessment - 05/09/16 0849    Subjective pt reports bone is sore during the day. Is doing exercises at home and feels comfortably discharging to independent program   How long can you sit comfortably? 1 hour   How long can you walk comfortably? has only been doing short distances   Currently in Pain? Yes   Pain Score 3    Pain Location Ankle   Pain Orientation Left;Medial   Pain Descriptors / Indicators Sore            [facility] PT Assessment - 05/09/16 0001    AROM   Left Ankle Dorsiflexion 10   Left Ankle Plantar Flexion 50   Strength   Left Hip Flexion 4+/5   Left Ankle Inversion 5/5   Left Ankle Eversion 5/5                     [facility] Adult PT Treatment/Exercise - 05/09/16 0001    Knee/Hip Exercises: Stretches   Piriformis Stretch 30 seconds   Knee/Hip Exercises: Standing   SLS with Vectors 3x30s   Gait Training heel toe and trunk rotation   Cryotherapy   Number Minutes Cryotherapy 10 Minutes   Cryotherapy Location Ankle   Type of Cryotherapy Ice pack   Ankle Exercises: Stretches   Slant Board Stretch 2 reps;30 seconds  PT Education - 05/09/16 0926    Education provided Yes   Education Details exercise form/rationale, gait pattern, HEP   Person(s) Educated Patient   Methods Explanation;Demonstration;Verbal cues;Tactile cues   Comprehension Verbalized understanding;Returned demonstration;Verbal cues required;Tactile cues required          PT Short Term Goals - 02/08/16 0909    PT SHORT TERM GOAL #1   Title Pt will be I with initial HEP for continued strengthening and mobility by 02/06/16.   Time 4   Period Weeks   Status Achieved   PT SHORT TERM GOAL #2   Title L ankle DF AROM will improve to 5 degrees pain-free in order to stand for 10 mins without pain.    Time 4   Period Weeks   Status Achieved   PT SHORT TERM GOAL #3   Title Pt will demonstrate proper TTWB with RW for all  gait 100 feet, indep by 02/06/16.     Time 4   Period Weeks   Status Achieved   PT SHORT TERM GOAL #4   Title Pt will no longer complain of dizziness with sit to supine transfers by 02/05/16.   Time 4   Period Weeks   Status Achieved           PT Long Term Goals - 05/09/16 4315    PT LONG TERM GOAL #1   Title L ankle PF strength will improve to 3+/5 in order to perform stairs pain free by  7/12   Status Achieved   PT LONG TERM GOAL #2   Title L ankle DF ROM will improve to 15 degrees in order for  pt to walk for 30 mins pain- free with SPC by 7/12   Baseline 10 deg 7/10   Status Not Met   PT LONG TERM GOAL #3   Title Pt will tolerate SLS on L LE ( once FWB by MD)  for 30 secs to improve functional mobility tolerance and decrease risk for falls by 7/12   Status Achieved   PT LONG TERM GOAL #4   Title L ankle malleoli circumference will improve to 24cm by 7/12   Baseline equal to R at d/c   Status Achieved   PT LONG TERM GOAL #5   Title Pt will be able to ambulate for 30 min without AD in order to ambulate around community by 7/12   Baseline uses cane outside of home due to fear of falling   Status Partially Met               Plan - 05/09/16 0927    Clinical Impression Statement Pt has made significant progress toward returning to full function. At this point, the pt is able to demonstrate exercises without need for skilled PT. Discussed HEP and gait pattern and pt verbalized readiness to be independent. Was instructed to contact us with any further questions.    Consulted and Agree with Plan of Care Patient      Patient will benefit from skilled therapeutic intervention in order to improve the following deficits and impairments:     Visit Diagnosis: Difficulty in walking  Muscle weakness (generalized)  Left ankle pain     Problem List Patient Active Problem List   Diagnosis Date Noted  . Bimalleolar fracture of left ankle 11/13/2015  . S/P ORIF (open  reduction internal fixation) fracture 11/13/2015  . Healthcare maintenance 10/08/2015  . Postmenopausal bleeding 10/08/2015  . Pain in joint, ankle and foot  10/08/2015  . Headache disorder 10/08/2015  . Atypical chest pain 10/08/2015  . Essential hypertension 10/08/2015  . Neck pain on right side 08/12/2015  . Grief reaction 03/09/2015  . Midline thoracic back pain 12/11/2014  . Depression (emotion) 08/12/2014  . Dyslipidemia 08/12/2014  . Headache(784.0) 07/17/2014  . UTI (urinary tract infection) 04/16/2014  . Pap smear for cervical cancer screening 04/16/2014  . Cystocele with rectocele 04/16/2014  . Hyperlipidemia LDL goal < 100 01/22/2014  . Breast lump on left side at 12 o'clock position 10/22/2013  . Chronic constipation 10/09/2013  . Bone lesion 10/04/2013  . Mammogram abnormal 10/04/2013  . Abdominal pain, unspecified site 10/04/2013  . Unspecified essential hypertension 06/10/2013  . Chest pain, unspecified 06/10/2013  . Chronic throat pain 06/10/2013  . Back pain 11/14/2011  . Obesity (BMI 30-39.9) 11/14/2011  . GERD (gastroesophageal reflux disease) 09/19/2011  . Calculus of gallbladder with acute and chronic cholecystitis without obstruction, s/p lap chole 27Dec2012 09/19/2011  . Hemorrhoids, internal, with bleeding 09/19/2011  . ANEMIA-IRON DEFICIENCY 03/19/2009  . CHEST PAIN, ATYPICAL 03/19/2009   Melainie Krinsky C. Mahlik Lenn PT, DPT 05/09/2016 9:33 AM   PHYSICAL THERAPY DISCHARGE SUMMARY  Visits from Start of Care: 21   Current functional level related to goals / functional outcomes: See above   Remaining deficits:see above   Education / Equipment: Anatomy of condition, POC, HEP, exercise form/rationale  Plan: Patient agrees to discharge.  Patient goals were partially met. Patient is being discharged due to being pleased with the current functional level.  ?????      [place], [place], [address] Phone: [phone]   Fax:  [phone]  Name: Ladana Chavero MRN: [id number] Date of Birth: April 23, 1961

## 2016-05-13 ENCOUNTER — Encounter: Payer: No Typology Code available for payment source | Admitting: Physical Therapy

## 2016-05-16 ENCOUNTER — Encounter: Payer: No Typology Code available for payment source | Admitting: Physical Therapy

## 2016-05-19 ENCOUNTER — Ambulatory Visit (INDEPENDENT_AMBULATORY_CARE_PROVIDER_SITE_OTHER): Payer: Self-pay | Admitting: Obstetrics & Gynecology

## 2016-05-19 VITALS — BP 116/80 | HR 86 | Ht 59.84 in | Wt 170.0 lb

## 2016-05-19 DIAGNOSIS — N950 Postmenopausal bleeding: Secondary | ICD-10-CM

## 2016-05-19 DIAGNOSIS — R32 Unspecified urinary incontinence: Secondary | ICD-10-CM | POA: Insufficient documentation

## 2016-05-19 LAB — POCT URINALYSIS DIP (DEVICE)
Bilirubin Urine: NEGATIVE
GLUCOSE, UA: NEGATIVE mg/dL
Hgb urine dipstick: NEGATIVE
KETONES UR: NEGATIVE mg/dL
LEUKOCYTES UA: NEGATIVE
NITRITE: NEGATIVE
Protein, ur: NEGATIVE mg/dL
Specific Gravity, Urine: 1.01 (ref 1.005–1.030)
Urobilinogen, UA: 0.2 mg/dL (ref 0.0–1.0)
pH: 6 (ref 5.0–8.0)

## 2016-05-19 NOTE — Patient Instructions (Signed)
Incontinencia urinaria (Urinary Incontinence) La incontinencia urinaria es la prdida involuntaria de orina de la vejiga. CAUSAS  Hay muchas causas de incontinencia urinaria. Ellas son:  Medicamentos.  Infecciones.  Agrandamiento prosttico que causa un aumento del flujo de [place] de la vejiga.  Ciruga.  Enfermedades neurolgicas.  Factores emocionales. SIGNOS Y SNTOMAS Hay cuatro tipos de incontinencia urinaria: 1. Incontinencia urinaria de urgencia: es la prdida involuntaria de orina antes de llegar al bao. Hay una urgencia repentina de orinar pero no llega a tiempo al bao. 2. Incontinencia por estrs: es la prdida repentina de orina con cualquier actividad que fuerza a Art gallery manager orina. La causa ms frecuente son los cambios anatmicos en la pelvis y las zonas del esfnter. 3. Incontinencia por rebosamiento: es la prdida de orina por una obstruccin de la apertura de la vejiga. Esto causa un retroceso de la orina y una acumulacin de presin dentro de la vejiga. Cuando la presin dentro de la vejiga excede la presin que Engineer, manufacturing systems, la orina rebalsa y causa incontinencia, similar al desbordamiento de un dique. 4. Incontinencia total: es la prdida de orina como resultado de la incapacidad de Financial controller orina dentro de la vejiga. DIAGNSTICO  La evaluacin de la causa puede requerir:  Ardelia Mems historia mdica y obsttrica exhaustiva y [place].  Examen fsico completo.  Anlisis de laboratorio como un urocultivo y [place]. Cuando se indican estudios adicionales, pueden incluirse:  Una ecografa.  Radiografas de vejiga y riones.  Una cistoscopa. Consiste en un examen de la vejiga utilizando un telescopio pequeo.  Estudios urodinmicos para comprobar la funcin nerviosa de la vejiga y Social worker. TRATAMIENTO  El tratamiento de la incontinencia urinaria depende de la causa:  Para la incontinencia urinaria causada por una infeccin urinaria, le indicarn  antibiticos. Si la incontinencia urinaria se relaciona con los [employer] toma, el mdico podr Public relations account executive.  Para la incontinencia por estrs, en necesaria una ciruga para restablecer el soporte anatmico de la vejiga o el esfnter, o ambos, lo que Clinical research associate.  Para la incontinencia por rebosamiento causada por una prstata agrandada, una operacin para abrir el canal a travs de la prstata agrandada permitir que el flujo de orina salga de la vejiga. En las mujeres con fibromas, ser necesaria una histerectoma.  Para la incontinencia total, una ciruga del esfnter podr ayudar. Puede ser necesario colocar un esfnter urinario artificial (se coloca un manguito inflable alrededor de Geologist, engineering). En las mujeres que hayan desarrollado un pasaje similar a un orificio entre la vejiga y la vagina (fstula vesicovaginal ), ser necesario realizar una ciruga para cerrar la fstula. INSTRUCCIONES PARA EL CUIDADO EN EL HOGAR  La higiene diaria normal y el uso regular de apsitos o paales para adultos cambiados con regularidad ayudan a prevenir olores y daos en la piel.  Evite la cafena. Puede sobreestimular la vejiga.  Use el bao con regularidad. Trate de ir al bao cada 2  3 horas, aunque no sienta la necesidad. Tmese el tiempo para vaciar la vejiga completamente. Despus de orinar, espere un minuto. Luego trate de orinar nuevamente.  Para las causas que implican una disfuncin nerviosa, lleve un registro de los medicamentos que toma y un diario de las veces que va al bao. SOLICITE ATENCIN MDICA SI:  Despus del procedimiento, experimenta un empeoramiento del dolor en vez de mejorar.  La incontinencia empeora en vez de mejorar. SOLICITE ATENCIN MDICA DE INMEDIATO SI:  Le sube la fiebre o tiene escalofros.  No  puede orinar.  Observa irritacin en la ingle o baja hacia los muslos. ASEGRESE DE QUE:   Comprende estas instrucciones.   Controlar su  afeccin.  Recibir ayuda de inmediato si no mejora o si empeora.   Esta informacin no tiene Marine scientist el consejo del mdico. Asegrese de hacerle al mdico cualquier pregunta que tenga.   Document Released: 10/17/2005 Document Revised: 11/07/2014 Elsevier Interactive Patient Education [employer].

## 2016-05-19 NOTE — Progress Notes (Signed)
Patient ID: Susan Lopez, female   DOB: 12/24/1960, 55 y.o.   MRN: [id number]  Chief Complaint  Patient presents with  . Follow-up    for PMB & pelvic pain  still occasional spotting  HPI Susan Lopez is a 55 y.o. female.  [id number] Patient's last menstrual period was 10/08/2015. Some spotting a few weeks ago, had negative EMB 12/03/15, few small fibroids seen on [name]  HPI  Past Medical History  Diagnosis Date  . Iron deficiency anemia   . Hypertension   . Arthritis   . Gallstones   . Hemorrhoids, external 10-21-11    occ. bothersome  . Calculus of gallbladder with acute and chronic cholecystitis without obstruction, s/p lap chole 27Dec2012 09/19/2011  . Bone lesion 10/04/2013  . Mammogram abnormal 10/04/2013  . Complication of anesthesia   . Depression   . Anxiety   . PONV (postoperative nausea and vomiting)     Past Surgical History  Procedure Laterality Date  . Cesarean section  1985, 1992  . Cholecystectomy  10/27/2011    Procedure: LAPAROSCOPIC CHOLECYSTECTOMY WITH INTRAOPERATIVE CHOLANGIOGRAM;  Surgeon: Adin Hector, MD;  Location: [facility];  Service: General;  Laterality: N/A;  Laparoscopic Chole w/ IOC Single Site  . Eye surgery  1`12-21-10    bil. for tissue growth-laser surgery  . Orif ankle fracture Left 11/13/2015    Procedure: OPEN REDUCTION INTERNAL FIXATION (ORIF) LEFT BIMALLEOLAR ANKLE FRACTURE;  Surgeon: Leandrew Koyanagi, MD;  Location[place];  Service: Orthopedics;  Laterality: Left;  . Hardware removal Left 03/16/2016    Procedure: LEFT ANKLE HARDWARE REMOVAL;  Surgeon: Leandrew Koyanagi, MD;  Location: [place];  Service: Orthopedics;  Laterality: Left;    Family History  Problem Relation Age of Onset  . Colon cancer Neg Hx   . Hypertension Mother   . Cancer Mother   . Hypertension Father     Social History Social History  Substance Use Topics  . Smoking status: Former Smoker -- 0.25 packs/day for 10 years    Quit date:  10/20/2004  . Smokeless tobacco: Never Used  . Alcohol Use: No    Allergies  Allergen Reactions  . Hydrocodone Nausea And Vomiting  . Tramadol Nausea Only  . Oxycodone Rash    Current Outpatient Prescriptions  Medication Sig Dispense Refill  . aspirin EC 325 MG tablet Take 1 tablet (325 mg total) by mouth 2 (two) times daily. 84 tablet 0  . atorvastatin (LIPITOR) 20 MG tablet Take 1 tablet (20 mg total) by mouth daily. 90 tablet 3  . cyclobenzaprine (FLEXERIL) 10 MG tablet Take 1 tablet (10 mg total) by mouth 3 (three) times daily as needed for muscle spasms. 30 tablet 3  . gabapentin (NEURONTIN) 100 MG capsule Take 1 capsule (100 mg total) by mouth 2 (two) times daily. 180 capsule 3  . lisinopril (PRINIVIL,ZESTRIL) 10 MG tablet Take 1 tablet by mouth daily.  2  . meloxicam (MOBIC) 7.5 MG tablet Take 1 tablet (7.5 mg total) by mouth daily. 30 tablet 3  . Multiple Vitamin (MULTIVITAMIN WITH MINERALS) TABS tablet Take 1 tablet by mouth daily.    Marland Kitchen omeprazole (PRILOSEC) 40 MG capsule Take 1 capsule (40 mg total) by mouth daily. 90 capsule 3  . Polyvinyl Alcohol-Povidone (REFRESH OP) Place 1 drop into both eyes 2 (two) times daily as needed.     . senna-docusate (SENOKOT S) 8.6-50 MG tablet Take 1 tablet by mouth at bedtime as needed. 30 tablet 1  No current facility-administered medications for this visit.    Review of Systems Review of Systems  Constitutional: Negative.   Gastrointestinal: Positive for constipation.  Genitourinary: Positive for dysuria, urgency, frequency, difficulty urinating and pelvic pain. Negative for vaginal bleeding, vaginal discharge and menstrual problem.    Blood pressure 116/80, pulse 86, height 4' 11.84" (1.52 m), weight 170 lb (77.111 kg), last menstrual period 10/08/2015.  Physical Exam Physical Exam  Constitutional: She is oriented to person, place, and time. She appears well-developed. No distress.  Pulmonary/Chest: Effort normal.   Neurological: She is alert and oriented to person, place, and time.  Skin: Skin is warm and dry.  Psychiatric: She has a normal mood and affect. Her behavior is normal.  Vitals reviewed.   Data Reviewed [name] 02/2016  Assessment    Perimenopausal bleeding Urinary urge and incontinence     Plan    F/U 6 mo Urology referral for sx IC and incontinence    Gradie Butrick 05/19/2016, 11:18 AM

## 2016-05-20 ENCOUNTER — Encounter: Payer: No Typology Code available for payment source | Admitting: Physical Therapy

## 2016-05-21 LAB — URINE CULTURE: Organism ID, Bacteria: 10000

## 2016-05-23 ENCOUNTER — Ambulatory Visit: Payer: No Typology Code available for payment source | Attending: Internal Medicine

## 2016-06-24 ENCOUNTER — Ambulatory Visit: Payer: Self-pay | Admitting: Urology

## 2016-07-01 ENCOUNTER — Telehealth: Payer: Self-pay | Admitting: General Practice

## 2016-07-01 NOTE — Telephone Encounter (Signed)
Patient called into front office and spoke with Beronica requesting call back in regards to a recent referral that was made as she has some questions. Called patient with pacific interpreter [phone], no answer- unable to leave message due to no voicemail

## 2016-08-22 ENCOUNTER — Ambulatory Visit: Payer: Self-pay

## 2016-08-31 ENCOUNTER — Telehealth: Payer: Self-pay | Admitting: Internal Medicine

## 2016-08-31 ENCOUNTER — Ambulatory Visit: Payer: Self-pay | Attending: Internal Medicine

## 2016-08-31 DIAGNOSIS — E785 Hyperlipidemia, unspecified: Secondary | ICD-10-CM

## 2016-08-31 DIAGNOSIS — M25579 Pain in unspecified ankle and joints of unspecified foot: Secondary | ICD-10-CM

## 2016-08-31 MED ORDER — OMEPRAZOLE 40 MG PO CPDR: 40.0000 mg | DELAYED_RELEASE_CAPSULE | Freq: Every day | ORAL | 0 refills | Status: DC

## 2016-08-31 MED ORDER — ATORVASTATIN CALCIUM 20 MG PO TABS: 20.0000 mg | ORAL_TABLET | Freq: Every day | ORAL | 0 refills | Status: DC

## 2016-08-31 MED ORDER — LISINOPRIL 10 MG PO TABS: 10.0000 mg | ORAL_TABLET | Freq: Every day | ORAL | 0 refills | Status: DC

## 2016-08-31 MED FILL — OMEPRAZOLE DR 40 MG CAPSULE: 40 | 30 days supply | Qty: 30 | Fill #0

## 2016-08-31 MED FILL — ?LISINOPRIL 10 MG TABLET: 10 | 30 days supply | Qty: 30 | Fill #0

## 2016-08-31 MED FILL — ?ATORVASTATIN 20 MG TABLET: 20 | 30 days supply | Qty: 30 | Fill #0

## 2016-08-31 NOTE — Telephone Encounter (Signed)
Requested medications refilled - patient needs office visit for further refills.

## 2016-08-31 NOTE — Telephone Encounter (Signed)
Patient came in requesting lisinopril , atorvastatin and omeprazole

## 2016-09-26 ENCOUNTER — Telehealth: Payer: Self-pay | Admitting: Internal Medicine

## 2016-09-26 DIAGNOSIS — M25579 Pain in unspecified ankle and joints of unspecified foot: Secondary | ICD-10-CM

## 2016-09-26 MED ORDER — LISINOPRIL 10 MG PO TABS: 10.0000 mg | ORAL_TABLET | Freq: Every day | ORAL | 0 refills | Status: DC

## 2016-09-26 MED ORDER — OMEPRAZOLE 40 MG PO CPDR: 40.0000 mg | DELAYED_RELEASE_CAPSULE | Freq: Every day | ORAL | 0 refills | Status: DC

## 2016-09-26 MED FILL — OMEPRAZOLE DR 40 MG CAPSULE: 40 | 30 days supply | Qty: 30 | Fill #0

## 2016-09-26 MED FILL — ?LISINOPRIL 10 MG TABLET: 10 | 30 days supply | Qty: 30 | Fill #0

## 2016-09-26 NOTE — Telephone Encounter (Signed)
Pt. Called requesting a refill on the following medications:   omeprazole (PRILOSEC) 40 MG capsule   lisinopril (PRINIVIL,ZESTRIL) 10 MG tablet   Pt. Was told to call back on 12/4 to schedule an appointment.  Please f/u

## 2016-09-26 NOTE — Telephone Encounter (Signed)
Requested medications refilled 

## 2016-10-07 ENCOUNTER — Ambulatory Visit: Payer: Self-pay | Attending: Internal Medicine | Admitting: Physician Assistant

## 2016-10-07 VITALS — BP 140/79 | HR 102 | Temp 98.5°F | Resp 16 | Wt 171.0 lb

## 2016-10-07 DIAGNOSIS — F419 Anxiety disorder, unspecified: Secondary | ICD-10-CM | POA: Insufficient documentation

## 2016-10-07 DIAGNOSIS — D509 Iron deficiency anemia, unspecified: Secondary | ICD-10-CM | POA: Insufficient documentation

## 2016-10-07 DIAGNOSIS — M1990 Unspecified osteoarthritis, unspecified site: Secondary | ICD-10-CM | POA: Insufficient documentation

## 2016-10-07 DIAGNOSIS — F329 Major depressive disorder, single episode, unspecified: Secondary | ICD-10-CM | POA: Insufficient documentation

## 2016-10-07 DIAGNOSIS — Z7982 Long term (current) use of aspirin: Secondary | ICD-10-CM | POA: Insufficient documentation

## 2016-10-07 DIAGNOSIS — I10 Essential (primary) hypertension: Secondary | ICD-10-CM | POA: Insufficient documentation

## 2016-10-07 DIAGNOSIS — J209 Acute bronchitis, unspecified: Secondary | ICD-10-CM

## 2016-10-07 DIAGNOSIS — K644 Residual hemorrhoidal skin tags: Secondary | ICD-10-CM | POA: Insufficient documentation

## 2016-10-07 MED ORDER — BENZONATATE 200 MG PO CAPS: 200.0000 mg | ORAL_CAPSULE | Freq: Two times a day (BID) | ORAL | 0 refills | Status: DC | PRN

## 2016-10-07 MED ORDER — AZITHROMYCIN 250 MG PO TABS: ORAL_TABLET | ORAL | 0 refills | Status: DC

## 2016-10-07 MED FILL — BENZONATATE 100 MG CAPSULE: 100 | 10 days supply | Qty: 40 | Fill #0

## 2016-10-07 MED FILL — ?AZITHROMYCIN 250 MG TABLET: 250 | 5 days supply | Qty: 6 | Fill #0

## 2016-10-07 NOTE — Patient Instructions (Signed)
Bronquitis aguda  (Acute Bronchitis)  Se denomina bronquitis cuando las vas respiratorias que van desde la trquea hasta los pulmones se irritan, se congestionan y duelen (se inflaman). La bronquitis generalmente produce flema espesa (mucosidad). Esto provoca tos. La tos es el sntoma ms frecuente de la bronquitis.  Cuando la bronquitis es aguda, generalmente comienza de manera sbita y desaparece luego de algn tiempo (generalmente en 2 semanas). El hbito de fumar, las alergias y el asma pueden empeorar la bronquitis. Los episodios repetidos de bronquitis pueden causar ms problemas pulmonares.  CUIDADOS EN EL HOGAR   Reposo.   Beba abundante cantidad de lquidos para mantener el pis orina) claro o amarillo plido (excepto que debe limitar la ingesta de lquidos por indicacin del mdico).   Tome slo medicamentos de venta libre o recetados, segn las indicaciones del mdico.   Evite fumar o aspirar el humo de otros fumadores. Esto puede empeorar la bronquitis. Si es fumador, considere el uso de chicles o parches en la piel de nicotina. Si deja de fumar, sus pulmones se curarn ms rpido.   Reduzca la probabilidad de enfermarse nuevamente de bronquitis de este modo:    Lvese las manos con frecuencia.    Evite las personas que tengan sntomas de resfro.    Trate de no llevarse las manos a la boca, la nariz o los ojos.   Concurra a las consultas de control con el mdico, segn las indicaciones.  SOLICITE AYUDA SI:  Los sntomas no mejoran despus de 1 semana de tratamiento. Los sntomas son:   Tos.   Fiebre.   Eliminar moco espeso al toser.   Dolores en el cuerpo.   Congestin en el pecho.   Escalofros.   Falta de aire.   Dolor de garganta.  SOLICITE AYUDA DE INMEDIATO SI:    Le sube la fiebre.   Tiene escalofros.   Comienza a sentir que le falta el aire de manera preocupante.   Tiene expectoracin con sangre (esputo).   Devuelve (vomita) con frecuencia.   Su organismo pierde mucho lquido  (deshidratacin).   Sufre un dolor intenso de cabeza.   Se desmaya.  ASEGRESE DE QUE:    Comprende estas instrucciones.   Controlar su afeccin.   Recibir ayuda de inmediato si no mejora o si empeora.     Esta informacin no tiene como fin reemplazar el consejo del mdico. Asegrese de hacerle al mdico cualquier pregunta que tenga.     Document Released: 06/19/2013  Elsevier Interactive Patient Education 2017 Elsevier Inc.

## 2016-10-07 NOTE — Progress Notes (Signed)
Susan Lopez, is a 55 y.o. female  [id number]  [id number]  DOB - 1961-04-13  Subjective:  Chief Complaint and HPI: Susan Lopez is a 55 y.o. female here today for 10 day h/o upper respiratory illness.  [employer] interpreters "Alyson Locket" interpreting.  Patient c/o 10 day history of cough and sinus congestion that worsened about 3 days ago when she started coughing more and having subjective fever. She did not check her temperature with a thermometer but was feeling hot then cold.  She c/o body aches.  3 days ago her cough became productive of greenish sputum.  She did not have a flu shot this year.  She denies change in appetite/N/V/D.   Not taking OTC meds.  ED/Hospital notes reviewed.     ROS:   Constitutional:  No f/c, No night sweats, No unexplained weight loss. EENT:  No vision changes, No blurry vision, No hearing changes. +congestion.  Respiratory: +cough, No SOB Cardiac: No CP, no palpitations GI:  No abd pain, No N/V/D. GU: No Urinary s/sx Musculoskeletal: No joint pain Neuro: No headache, no dizziness, no motor weakness.  Skin: No rash Endocrine:  No polydipsia. No polyuria.  Psych: Denies SI/HI  No problems updated.  ALLERGIES: Allergies  Allergen Reactions  . Hydrocodone Nausea And Vomiting  . Tramadol Nausea Only  . Oxycodone Rash    PAST MEDICAL HISTORY: Past Medical History:  Diagnosis Date  . Anxiety   . Arthritis   . Bone lesion 10/04/2013  . Calculus of gallbladder with acute and chronic cholecystitis without obstruction, s/p lap chole 27Dec2012 09/19/2011  . Complication of anesthesia   . Depression   . Gallstones   . Hemorrhoids, external 10-21-11   occ. bothersome  . Hypertension   . Iron deficiency anemia   . Mammogram abnormal 10/04/2013  . PONV (postoperative nausea and vomiting)     MEDICATIONS AT HOME: Prior to Admission medications   Medication Sig Start Date End Date Taking? Authorizing Provider  aspirin EC  325 MG tablet Take 1 tablet (325 mg total) by mouth 2 (two) times daily. Patient not taking: Reported on 10/07/2016 11/13/15   Leandrew Koyanagi, MD  atorvastatin (LIPITOR) 20 MG tablet Take 1 tablet (20 mg total) by mouth daily. 08/31/16   Tresa Garter, MD  azithromycin (ZITHROMAX) 250 MG tablet Take 2 tabs today then 1 tablet daily 10/07/16   Argentina Donovan, PA-C  benzonatate (TESSALON) 200 MG capsule Take 1 capsule (200 mg total) by mouth 2 (two) times daily as needed for cough. 10/07/16   Argentina Donovan, PA-C  cyclobenzaprine (FLEXERIL) 10 MG tablet Take 1 tablet (10 mg total) by mouth 3 (three) times daily as needed for muscle spasms. Patient not taking: Reported on 10/07/2016 10/08/15   Tresa Garter, MD  gabapentin (NEURONTIN) 100 MG capsule Take 1 capsule (100 mg total) by mouth 2 (two) times daily. Patient not taking: Reported on 10/07/2016 03/09/15   Lorayne Marek, MD  lisinopril (PRINIVIL,ZESTRIL) 10 MG tablet Take 1 tablet (10 mg total) by mouth daily. 09/26/16   Tresa Garter, MD  meloxicam (MOBIC) 7.5 MG tablet Take 1 tablet (7.5 mg total) by mouth daily. Patient not taking: Reported on 10/07/2016 03/03/16   Tresa Garter, MD  Multiple Vitamin (MULTIVITAMIN WITH MINERALS) TABS tablet Take 1 tablet by mouth daily.    Historical Provider, MD  omeprazole (PRILOSEC) 40 MG capsule Take 1 capsule (40 mg total) by mouth daily. 09/26/16   Tresa Garter, MD  Polyvinyl Alcohol-Povidone (REFRESH OP) Place 1 drop into both eyes 2 (two) times daily as needed.     Historical Provider, MD  senna-docusate (SENOKOT S) 8.6-50 MG tablet Take 1 tablet by mouth at bedtime as needed. Patient not taking: Reported on 10/07/2016 11/13/15   Leandrew Koyanagi, MD     Objective:  EXAM:   Vitals:   10/07/16 1037  BP: 140/79  Pulse: (!) 102  Resp: 16  Temp: 98.5 F (36.9 C)  TempSrc: Oral  SpO2: 96%  Weight: 171 lb (77.6 kg)   Recheck pulse ox 97-98% General appearance : A&OX3. NAD.  Non-toxic-appearing HEENT: Atraumatic and Normocephalic.  PERRLA. EOM intact.  TM bulging B without erythema. Mouth-MMM, post pharynx WNL w/o erythema, No PND.  + nasal congestion Neck: supple, no JVD. No cervical lymphadenopathy. No thyromegaly Chest/Lungs:  Breathing-non-labored, Good air entry and movement bilaterally throughout. No rales or wheezing.  There are mild coarse breath sounds B bases. CVS: S1 S2 regular, no murmurs, gallops, rubs Extremities: Bilateral Lower Ext shows no edema, both legs are warm to touch with = pulse throughout Neurology:  CN II-XII grossly intact, Non focal.   Psych:  TP linear. J/I WNL. Normal speech. Appropriate eye contact and affect.  Skin:  No Rash  Data Review Lab Results  Component Value Date   HGBA1C 5.6 12/20/2013   HGBA1C 5.4 06/10/2013     Assessment & Plan   1. Acute bronchitis, unspecified organism - azithromycin (ZITHROMAX) 250 MG tablet; Take 2 tabs today then 1 tablet daily  Dispense: 6 tablet; Refill: 0 - benzonatate (TESSALON) 200 MG capsule; Take 1 capsule (200 mg total) by mouth 2 (two) times daily as needed for cough.  Dispense: 20 capsule; Refill: 0   Patient have been counseled extensively about nutrition and exercise  Return in about 3 weeks (around 10/28/2016) for f/up with Dr Jegede/cholesterol/labs.  The patient was given clear instructions to go to ER or return to medical center if symptoms don't improve, worsen or new problems develop. The patient verbalized understanding. The patient was told to call to get lab results if they haven't heard anything in the next week.     Freeman Caldron, PA-C [facility] [place], [place]   10/07/2016, 11:23 AMPatient ID: [name], female   DOB: [birth year], 55 y.o.   MRN: [id number]

## 2016-10-13 ENCOUNTER — Ambulatory Visit: Payer: Self-pay | Attending: Internal Medicine | Admitting: Internal Medicine

## 2016-10-13 ENCOUNTER — Encounter: Payer: Self-pay | Admitting: Internal Medicine

## 2016-10-13 ENCOUNTER — Ambulatory Visit (HOSPITAL_COMMUNITY)
Admission: RE | Admit: 2016-10-13 | Discharge: 2016-10-13 | Disposition: A | Discharge status: Home / Self Care | Payer: Self-pay | Source: Ambulatory Visit | Attending: Internal Medicine | Admitting: Internal Medicine

## 2016-10-13 VITALS — BP 103/70 | HR 91 | Temp 98.5°F | Resp 18 | Ht 61.0 in | Wt 172.0 lb

## 2016-10-13 DIAGNOSIS — Z7982 Long term (current) use of aspirin: Secondary | ICD-10-CM | POA: Insufficient documentation

## 2016-10-13 DIAGNOSIS — R918 Other nonspecific abnormal finding of lung field: Secondary | ICD-10-CM | POA: Insufficient documentation

## 2016-10-13 DIAGNOSIS — E785 Hyperlipidemia, unspecified: Secondary | ICD-10-CM

## 2016-10-13 DIAGNOSIS — F419 Anxiety disorder, unspecified: Secondary | ICD-10-CM | POA: Insufficient documentation

## 2016-10-13 DIAGNOSIS — J209 Acute bronchitis, unspecified: Secondary | ICD-10-CM

## 2016-10-13 DIAGNOSIS — R05 Cough: Secondary | ICD-10-CM | POA: Insufficient documentation

## 2016-10-13 DIAGNOSIS — D509 Iron deficiency anemia, unspecified: Secondary | ICD-10-CM | POA: Insufficient documentation

## 2016-10-13 DIAGNOSIS — I10 Essential (primary) hypertension: Secondary | ICD-10-CM

## 2016-10-13 MED ORDER — AMOXICILLIN-POT CLAVULANATE 875-125 MG PO TABS: 1.0000 | ORAL_TABLET | Freq: Two times a day (BID) | ORAL | 0 refills | Status: DC

## 2016-10-13 MED ORDER — GUAIFENESIN-DM 100-10 MG/5ML PO SYRP: 5.0000 mL | ORAL_SOLUTION | ORAL | 0 refills | Status: DC | PRN

## 2016-10-13 NOTE — Progress Notes (Signed)
Patient is here for Cold SX  Patient complains of pressure in her head and pain in the chest from coughing over the past 3 weeks.  Patient has taken medication today. Patient has eaten today.

## 2016-10-13 NOTE — Patient Instructions (Signed)
Bronquitis aguda  (Acute Bronchitis)  Se denomina bronquitis cuando las vas respiratorias que van desde la trquea hasta los pulmones se irritan, se congestionan y duelen (se inflaman). La bronquitis generalmente produce flema espesa (mucosidad). Esto provoca tos. La tos es el sntoma ms frecuente de la bronquitis.  Cuando la bronquitis es aguda, generalmente comienza de manera sbita y desaparece luego de algn tiempo (generalmente en 2 semanas). El hbito de fumar, las alergias y el asma pueden empeorar la bronquitis. Los episodios repetidos de bronquitis pueden causar ms problemas pulmonares.  CUIDADOS EN EL HOGAR   Reposo.   Beba abundante cantidad de lquidos para mantener el pis orina) claro o amarillo plido (excepto que debe limitar la ingesta de lquidos por indicacin del mdico).   Tome slo medicamentos de venta libre o recetados, segn las indicaciones del mdico.   Evite fumar o aspirar el humo de otros fumadores. Esto puede empeorar la bronquitis. Si es fumador, considere el uso de chicles o parches en la piel de nicotina. Si deja de fumar, sus pulmones se curarn ms rpido.   Reduzca la probabilidad de enfermarse nuevamente de bronquitis de este modo:    Lvese las manos con frecuencia.    Evite las personas que tengan sntomas de resfro.    Trate de no llevarse las manos a la boca, la nariz o los ojos.   Concurra a las consultas de control con el mdico, segn las indicaciones.  SOLICITE AYUDA SI:  Los sntomas no mejoran despus de 1 semana de tratamiento. Los sntomas son:   Tos.   Fiebre.   Eliminar moco espeso al toser.   Dolores en el cuerpo.   Congestin en el pecho.   Escalofros.   Falta de aire.   Dolor de garganta.  SOLICITE AYUDA DE INMEDIATO SI:    Le sube la fiebre.   Tiene escalofros.   Comienza a sentir que le falta el aire de manera preocupante.   Tiene expectoracin con sangre (esputo).   Devuelve (vomita) con frecuencia.   Su organismo pierde mucho lquido  (deshidratacin).   Sufre un dolor intenso de cabeza.   Se desmaya.  ASEGRESE DE QUE:    Comprende estas instrucciones.   Controlar su afeccin.   Recibir ayuda de inmediato si no mejora o si empeora.     Esta informacin no tiene como fin reemplazar el consejo del mdico. Asegrese de hacerle al mdico cualquier pregunta que tenga.     Document Released: 06/19/2013  Elsevier Interactive Patient Education 2017 Elsevier Inc.

## 2016-10-13 NOTE — Progress Notes (Signed)
Susan Lopez, is a 55 y.o. female  [id number]  [id number]  DOB - 01/13/1961  Chief Complaint  Patient presents with  . URI       Subjective:   Susan Lopez is a 55 y.o. female here today for a sick visit. Patient is complaining or upper respiratory infection symptoms with cough and sinus pressure and head fullness. Cough is productive of brown sputum. Patient denies any fever, no chest pain, no SOB. Denies sick contact. No abdominal pain - No Nausea, No new weakness tingling or numbness. She was seen recently for the same symptom, took Zithromax but no sustained relief. Patient has multiple medical history including HTN, iron def anemia from chronic blood loss PV, major depression and anxiety.  No problems updated.  ALLERGIES: Allergies  Allergen Reactions  . Hydrocodone Nausea And Vomiting  . Tramadol Nausea Only  . Oxycodone Rash    PAST MEDICAL HISTORY: Past Medical History:  Diagnosis Date  . Anxiety   . Arthritis   . Bone lesion 10/04/2013  . Calculus of gallbladder with acute and chronic cholecystitis without obstruction, s/p lap chole 27Dec2012 09/19/2011  . Complication of anesthesia   . Depression   . Gallstones   . Hemorrhoids, external 10-21-11   occ. bothersome  . Hypertension   . Iron deficiency anemia   . Mammogram abnormal 10/04/2013  . PONV (postoperative nausea and vomiting)     MEDICATIONS AT HOME: Prior to Admission medications   Medication Sig Start Date End Date Taking? Authorizing Provider  amoxicillin-clavulanate (AUGMENTIN) 875-125 MG tablet Take 1 tablet by mouth 2 (two) times daily. 10/13/16   Tresa Garter, MD  aspirin EC 325 MG tablet Take 1 tablet (325 mg total) by mouth 2 (two) times daily. Patient not taking: Reported on 10/07/2016 11/13/15   Leandrew Koyanagi, MD  atorvastatin (LIPITOR) 20 MG tablet Take 1 tablet (20 mg total) by mouth daily. 08/31/16   Tresa Garter, MD  gabapentin (NEURONTIN) 100 MG  capsule Take 1 capsule (100 mg total) by mouth 2 (two) times daily. Patient not taking: Reported on 10/07/2016 03/09/15   Lorayne Marek, MD  guaiFENesin-dextromethorphan (ROBITUSSIN DM) 100-10 MG/5ML syrup Take 5 mLs by mouth every 4 (four) hours as needed for cough. 10/13/16   Tresa Garter, MD  lisinopril (PRINIVIL,ZESTRIL) 10 MG tablet Take 1 tablet (10 mg total) by mouth daily. 09/26/16   Tresa Garter, MD  meloxicam (MOBIC) 7.5 MG tablet Take 1 tablet (7.5 mg total) by mouth daily. Patient not taking: Reported on 10/07/2016 03/03/16   Tresa Garter, MD  Multiple Vitamin (MULTIVITAMIN WITH MINERALS) TABS tablet Take 1 tablet by mouth daily.    Historical Provider, MD  omeprazole (PRILOSEC) 40 MG capsule Take 1 capsule (40 mg total) by mouth daily. 09/26/16   Tresa Garter, MD  Polyvinyl Alcohol-Povidone (REFRESH OP) Place 1 drop into both eyes 2 (two) times daily as needed.     Historical Provider, MD  senna-docusate (SENOKOT S) 8.6-50 MG tablet Take 1 tablet by mouth at bedtime as needed. Patient not taking: Reported on 10/07/2016 11/13/15   Leandrew Koyanagi, MD    Objective:   Vitals:   10/13/16 1231  BP: 103/70  Pulse: 91  Resp: 18  Temp: 98.5 F (36.9 C)  TempSrc: Oral  SpO2: 97%  Weight: 172 lb (78 kg)  Height: 5\' 1"  (1.549 m)   Exam General appearance : Awake, alert, not in any distress. Speech Clear. Not toxic looking  HEENT: Atraumatic and Normocephalic, pupils equally reactive to light and accomodation Neck: Supple, no JVD. No cervical lymphadenopathy.  Chest: Good air entry bilaterally, no added sounds  CVS: S1 S2 regular, no murmurs.  Abdomen: Bowel sounds present, Non tender and not distended with no gaurding, rigidity or rebound. Extremities: B/L Lower Ext shows no edema, both legs are warm to touch Neurology: Awake alert, and oriented X 3, CN II-XII intact, Non focal Skin: No Rash  Data Review Lab Results  Component Value Date   HGBA1C 5.6  12/20/2013   HGBA1C 5.4 06/10/2013   Assessment & Plan   1. Acute bronchitis, unspecified organism  - amoxicillin-clavulanate (AUGMENTIN) 875-125 MG tablet; Take 1 tablet by mouth 2 (two) times daily.  Dispense: 14 tablet; Refill: 0 - guaiFENesin-dextromethorphan (ROBITUSSIN DM) 100-10 MG/5ML syrup; Take 5 mLs by mouth every 4 (four) hours as needed for cough.  Dispense: 480 mL; Refill: 0 - DG Chest 2 View; Future  2. Dyslipidemia  To address this please limit saturated fat to no more than 7% of your calories, limit cholesterol to 200 mg/day, increase fiber and exercise as tolerated. If needed we may add another cholesterol lowering medication to your regimen.   3. Essential hypertension  We have discussed target BP range and blood pressure goal. I have advised patient to check BP regularly and to call us back or report to clinic if the numbers are consistently higher than 140/90. We discussed the importance of compliance with medical therapy and DASH diet recommended, consequences of uncontrolled hypertension discussed.  - continue current BP medications  Patient have been counseled extensively about nutrition and exercise. Other issues discussed during this visit include: low cholesterol diet, weight control and daily exercise, importance of adherence with medications and regular follow-up. We also discussed long term complications of uncontrolled hypertension.   Return in about 4 weeks (around 11/10/2016) for Follow up HTN, Bronchitis.  The patient was given clear instructions to go to ER or return to medical center if symptoms don't improve, worsen or new problems develop. The patient verbalized understanding. The patient was told to call to get lab results if they haven't heard anything in the next week.   This note has been created with Surveyor, quantity. Any transcriptional errors are unintentional.    Angelica Chessman, MD, [place], [place],  [place], [place] and [place], [place]   10/13/2016, 1:06 PM

## 2016-10-21 ENCOUNTER — Telehealth: Payer: Self-pay

## 2016-10-21 NOTE — Telephone Encounter (Signed)
Patient verified date of birth.  Patient is aware chest x ray results being normal.  Patient express understanding no further question at this time.

## 2016-10-21 NOTE — Progress Notes (Signed)
Please inform the patient of her chest xray being normal.

## 2016-10-21 NOTE — Telephone Encounter (Signed)
-----   Message from Susan Lopez, [place] sent at 10/21/2016 11:05 AM EST ----- Please inform the patient of her chest xray being normal.

## 2016-11-07 ENCOUNTER — Telehealth: Payer: Self-pay | Admitting: Internal Medicine

## 2016-11-07 DIAGNOSIS — M25579 Pain in unspecified ankle and joints of unspecified foot: Secondary | ICD-10-CM

## 2016-11-07 DIAGNOSIS — E785 Hyperlipidemia, unspecified: Secondary | ICD-10-CM

## 2016-11-07 MED ORDER — ATORVASTATIN CALCIUM 20 MG PO TABS: 20.0000 mg | ORAL_TABLET | Freq: Every day | ORAL | 0 refills | Status: DC

## 2016-11-07 MED ORDER — OMEPRAZOLE 40 MG PO CPDR: 40.0000 mg | DELAYED_RELEASE_CAPSULE | Freq: Every day | ORAL | 0 refills | Status: DC

## 2016-11-07 MED ORDER — LISINOPRIL 10 MG PO TABS
10.0000 mg | ORAL_TABLET | Freq: Every day | ORAL | 0 refills | Status: DC
Start: 2016-11-07 — End: 2016-11-23

## 2016-11-07 MED FILL — ?ATORVASTATIN 20 MG TABLET: 20 | 30 days supply | Qty: 30 | Fill #0

## 2016-11-07 MED FILL — ?LISINOPRIL 10 MG TABLET: 10 | 30 days supply | Qty: 30 | Fill #0

## 2016-11-07 MED FILL — OMEPRAZOLE DR 40 MG CAPSULE: 40 | 30 days supply | Qty: 30 | Fill #0

## 2016-11-07 NOTE — Telephone Encounter (Signed)
Patient called the office to request medication refill for   lisinopril (PRINIVIL,ZESTRIL) 10 MG tablet  omeprazole (PRILOSEC) 40 MG capsule  atorvastatin (LIPITOR) 20 MG tablet   Please send rx to our pharmacy. Pt needs bp medication as soon as possible.   Thank you.

## 2016-11-07 NOTE — Telephone Encounter (Signed)
Requested medications refilled 

## 2016-11-16 ENCOUNTER — Ambulatory Visit: Payer: Self-pay | Admitting: Internal Medicine

## 2016-11-23 ENCOUNTER — Encounter: Payer: Self-pay | Admitting: Internal Medicine

## 2016-11-23 ENCOUNTER — Ambulatory Visit: Payer: Self-pay | Attending: Internal Medicine | Admitting: Internal Medicine

## 2016-11-23 VITALS — BP 120/71 | HR 70 | Temp 98.4°F | Resp 18 | Ht 62.0 in | Wt 170.8 lb

## 2016-11-23 DIAGNOSIS — Z7982 Long term (current) use of aspirin: Secondary | ICD-10-CM | POA: Insufficient documentation

## 2016-11-23 DIAGNOSIS — X58XXXA Exposure to other specified factors, initial encounter: Secondary | ICD-10-CM | POA: Insufficient documentation

## 2016-11-23 DIAGNOSIS — E785 Hyperlipidemia, unspecified: Secondary | ICD-10-CM | POA: Insufficient documentation

## 2016-11-23 DIAGNOSIS — Z8719 Personal history of other diseases of the digestive system: Secondary | ICD-10-CM | POA: Insufficient documentation

## 2016-11-23 DIAGNOSIS — M25579 Pain in unspecified ankle and joints of unspecified foot: Secondary | ICD-10-CM | POA: Insufficient documentation

## 2016-11-23 DIAGNOSIS — I10 Essential (primary) hypertension: Secondary | ICD-10-CM | POA: Insufficient documentation

## 2016-11-23 DIAGNOSIS — F329 Major depressive disorder, single episode, unspecified: Secondary | ICD-10-CM | POA: Insufficient documentation

## 2016-11-23 DIAGNOSIS — F419 Anxiety disorder, unspecified: Secondary | ICD-10-CM | POA: Insufficient documentation

## 2016-11-23 DIAGNOSIS — Z87891 Personal history of nicotine dependence: Secondary | ICD-10-CM | POA: Insufficient documentation

## 2016-11-23 DIAGNOSIS — S0592XA Unspecified injury of left eye and orbit, initial encounter: Secondary | ICD-10-CM | POA: Insufficient documentation

## 2016-11-23 LAB — CBC WITH DIFFERENTIAL/PLATELET
BASOS PCT: 1 %
Basophils Absolute: 58 cells/uL (ref 0–200)
EOS ABS: 116 {cells}/uL (ref 15–500)
Eosinophils Relative: 2 %
HCT: 44.1 % (ref 35.0–45.0)
Hemoglobin: 14.8 g/dL (ref 11.7–15.5)
LYMPHS PCT: 32 %
Lymphs Abs: 1856 cells/uL (ref 850–3900)
MCH: 31.2 pg (ref 27.0–33.0)
MCHC: 33.6 g/dL (ref 32.0–36.0)
MCV: 92.8 fL (ref 80.0–100.0)
MONOS PCT: 7 %
MPV: 10.3 fL (ref 7.5–12.5)
Monocytes Absolute: 406 cells/uL (ref 200–950)
Neutro Abs: 3364 cells/uL (ref 1500–7800)
Neutrophils Relative %: 58 %
PLATELETS: 260 10*3/uL (ref 140–400)
RBC: 4.75 MIL/uL (ref 3.80–5.10)
RDW: 13.5 % (ref 11.0–15.0)
WBC: 5.8 10*3/uL (ref 3.8–10.8)

## 2016-11-23 LAB — LIPID PANEL
Cholesterol: 219 mg/dL — ABNORMAL HIGH (ref <200)
HDL: 41 mg/dL — AB (ref >50)
LDL Cholesterol: 117 mg/dL — ABNORMAL HIGH (ref <100)
Total CHOL/HDL Ratio: 5.3 Ratio — ABNORMAL HIGH (ref <5.0)
Triglycerides: 304 mg/dL — ABNORMAL HIGH (ref <150)
VLDL: 61 mg/dL — ABNORMAL HIGH (ref <30)

## 2016-11-23 LAB — COMPLETE METABOLIC PANEL WITH GFR
ALBUMIN: 4 g/dL (ref 3.6–5.1)
ALT: 28 U/L (ref 6–29)
AST: 16 U/L (ref 10–35)
Alkaline Phosphatase: 105 U/L (ref 33–130)
BILIRUBIN TOTAL: 0.4 mg/dL (ref 0.2–1.2)
BUN: 12 mg/dL (ref 7–25)
CALCIUM: 9.6 mg/dL (ref 8.6–10.4)
CO2: 23 mmol/L (ref 20–31)
CREATININE: 0.61 mg/dL (ref 0.50–1.05)
Chloride: 108 mmol/L (ref 98–110)
GFR, Est Non African American: >89 mL/min (ref >=60)
Glucose, Bld: 110 mg/dL — ABNORMAL HIGH (ref 65–99)
Potassium: 4.2 mmol/L (ref 3.5–5.3)
Sodium: 140 mmol/L (ref 135–146)
TOTAL PROTEIN: 7 g/dL (ref 6.1–8.1)

## 2016-11-23 MED ORDER — MELOXICAM 7.5 MG PO TABS: 7.5000 mg | ORAL_TABLET | Freq: Every day | ORAL | 3 refills | Status: DC

## 2016-11-23 MED ORDER — LISINOPRIL 10 MG PO TABS: 10.0000 mg | ORAL_TABLET | Freq: Every day | ORAL | 3 refills | Status: DC

## 2016-11-23 MED ORDER — KETOROLAC TROMETHAMINE 0.4 % OP SOLN: 1.0000 [drp] | Freq: Four times a day (QID) | OPHTHALMIC | 1 refills | Status: DC

## 2016-11-23 MED ORDER — GABAPENTIN 100 MG PO CAPS: 100.0000 mg | ORAL_CAPSULE | Freq: Two times a day (BID) | ORAL | 3 refills | Status: DC

## 2016-11-23 MED ORDER — OMEPRAZOLE 40 MG PO CPDR: 40.0000 mg | DELAYED_RELEASE_CAPSULE | Freq: Every day | ORAL | 0 refills | Status: DC

## 2016-11-23 MED ORDER — ATORVASTATIN CALCIUM 20 MG PO TABS: 20.0000 mg | ORAL_TABLET | Freq: Every day | ORAL | 3 refills | Status: DC

## 2016-11-23 MED FILL — GABAPENTIN 100 MG CAPSULE: 100 | 30 days supply | Qty: 60 | Fill #0

## 2016-11-23 MED FILL — MELOXICAM 7.5 MG TABLET: 7.5 | 30 days supply | Qty: 30 | Fill #0

## 2016-11-23 MED FILL — KETOROLAC 0.4% OPHTH SOLN: 0.4 | 18 days supply | Qty: 5 | Fill #0

## 2016-11-23 NOTE — Progress Notes (Signed)
Patient is here for FU HTN  Patient complains of left eye pain being present for the past 4 days. Patient states she poked herself in the eye on accident while lifting something. Pain is scaled currently at an 8.  Patient has taken medication today. Patient has eaten today.

## 2016-11-23 NOTE — Progress Notes (Signed)
Susan Lopez, is a 56 y.o. female  ZN:[id number]  KM:[id number]  DOB - February 23, 1961  Chief Complaint  Patient presents with  . Hypertension  . Eye Pain    left        Subjective:   Susan Lopez is a 56 y.o. female here today for a follow up visit for her high blood pressure and new left eye pain.  Patient says that 4 days ago she scratched her eye on the tack on a suitcase. Since then she has had constant burning pain, blurry vision, redness, light sensitivity, and increased tearing. She feels it is uncomfortable to read or concentrate on fine objects. She has been taking tylenol and refresh eye drops for pain which have helped. Denies double vision.   Patient is also here for follow-up of elevated blood pressure. She is not exercising and is partially adherent to low salt diet.  Blood pressure is well controlled at home. Cardiac symptoms none. Patient denies headache, chest pain, chest pressure/discomfort, claudication, dyspnea and fatigue.  She takes lisinopril 10mg  at home and requests we provide her with 3 refills at a time due to her limited access to transportation.   Patient is being treated for hyperlipidemia. Her labs were last checked 10/08/2015 and showed HDL 40, LDL, 48, VLDL 44, Triglycerides 219, total cholesterol 132. She takes atorvastatin 20mg  daily. Denies muscle aches or pains and is fasting today in anticipation of lab work.   She is also questioning if she needs a referral to ortho after an ORIF 11/2015 for a malleolar fracture. She had the hardware removed in 02/2016 but says when she wasn't able to work she lost her job and her medical insurance which prevented her from following up with ortho in the past six months. She reports ambulating well and no continued concerns.    Review of Systems  Constitutional: Negative for chills, fever and malaise/fatigue.  HENT: Negative for ear pain, sinus pain, sore throat and tinnitus.   Eyes: Positive for  blurred vision, photophobia, pain, discharge (clear/tearing) and redness. Negative for double vision.  Respiratory: Negative for cough, sputum production and shortness of breath.   Cardiovascular: Negative for chest pain, palpitations, claudication and leg swelling.  Gastrointestinal: Negative for abdominal pain, heartburn, nausea and vomiting.  Genitourinary: Negative for dysuria, frequency and urgency.  Musculoskeletal: Negative for falls, joint pain and myalgias.  Neurological: Negative for dizziness, loss of consciousness, weakness and headaches.  Endo/Heme/Allergies: Negative for polydipsia.  Psychiatric/Behavioral: Negative for depression. The patient is not nervous/anxious.    Interpreter was used to communicate directly with patient for the entire encounter including providing detailed patient instructions. Spanish Interpreter Gena [phone]   ALLERGIES: Allergies  Allergen Reactions  . Hydrocodone Nausea And Vomiting  . Tramadol Nausea Only  . Oxycodone Rash    PAST MEDICAL HISTORY: Past Medical History:  Diagnosis Date  . Anxiety   . Arthritis   . Bone lesion 10/04/2013  . Calculus of gallbladder with acute and chronic cholecystitis without obstruction, s/p lap chole [id number] 09/19/2011  . Complication of anesthesia   . Depression   . Gallstones   . Hemorrhoids, external 10-21-11   occ. bothersome  . Hypertension   . Iron deficiency anemia   . Mammogram abnormal 10/04/2013  . PONV (postoperative nausea and vomiting)    Past Surgical History:  Procedure Laterality Date  . [place]  . CHOLECYSTECTOMY  10/27/2011   Procedure: LAPAROSCOPIC CHOLECYSTECTOMY WITH INTRAOPERATIVE CHOLANGIOGRAM;  Surgeon: Adin Hector,  MD;  Location: [facility];  Service: General;  Laterality: N/A;  Laparoscopic Chole w/ IOC Single Site  . EYE SURGERY  1`12-21-10   bil. for tissue growth-laser surgery  . HARDWARE REMOVAL Left 03/16/2016   Procedure: LEFT ANKLE HARDWARE  REMOVAL;  Surgeon: Leandrew Koyanagi, MD;  Location: [facility];  Service: Orthopedics;  Laterality: Left;  . ORIF ANKLE FRACTURE Left 11/13/2015   Procedure: OPEN REDUCTION INTERNAL FIXATION (ORIF) LEFT BIMALLEOLAR ANKLE FRACTURE;  Surgeon: Leandrew Koyanagi, MD;  Location: [facility];  Service: Orthopedics;  Laterality: Left;    Social History   Social History  . Marital status: Single    Spouse name: N/A  . Number of children: N/A  . Years of education: N/A   Occupational History  . Not on file.   Social History Main Topics  . Smoking status: Former Smoker    Packs/day: 0.25    Years: 10.00    Quit date: 10/20/2004  . Smokeless tobacco: Never Used  . Alcohol use No  . Drug use: No  . Sexual activity: Yes    Birth control/ protection: Post-menopausal   Other Topics Concern  . Not on file   Social History Narrative  . No narrative on file    MEDICATIONS AT HOME: Prior to Admission medications   Medication Sig Start Date End Date Taking? Authorizing Provider  gabapentin (NEURONTIN) 100 MG capsule Take 1 capsule (100 mg total) by mouth 2 (two) times daily. 11/23/16  Yes Tresa Garter, MD  lisinopril (PRINIVIL,ZESTRIL) 10 MG tablet Take 1 tablet (10 mg total) by mouth daily. 11/23/16  Yes Tresa Garter, MD  Multiple Vitamin (MULTIVITAMIN WITH MINERALS) TABS tablet Take 1 tablet by mouth daily.   Yes Historical Provider, MD  omeprazole (PRILOSEC) 40 MG capsule Take 1 capsule (40 mg total) by mouth daily. 11/23/16  Yes Tresa Garter, MD  Polyvinyl Alcohol-Povidone (REFRESH OP) Place 1 drop into both eyes 2 (two) times daily as needed.    Yes Historical Provider, MD  senna-docusate (SENOKOT S) 8.6-50 MG tablet Take 1 tablet by mouth at bedtime as needed. 11/13/15  Yes Naiping Ephriam Jenkins, MD  aspirin EC 325 MG tablet Take 1 tablet (325 mg total) by mouth 2 (two) times daily. Patient not taking: Reported on 10/07/2016 11/13/15   Leandrew Koyanagi, MD  atorvastatin (LIPITOR) 20  MG tablet Take 1 tablet (20 mg total) by mouth daily. 11/23/16   Tresa Garter, MD  ketorolac (ACULAR) 0.4 % SOLN Place 1 drop into the left eye 4 (four) times daily. 11/23/16   Tresa Garter, MD  meloxicam (MOBIC) 7.5 MG tablet Take 1 tablet (7.5 mg total) by mouth daily. 11/23/16   Tresa Garter, MD     Objective:   Vitals:   11/23/16 0850  BP: 120/71  Pulse: 70  Resp: 18  Temp: 98.4 F (36.9 C)  TempSrc: Oral  SpO2: 98%  Weight: 170 lb 12.8 oz (77.5 kg)  Height: 5\' 2"  (1.575 m)    Physical Exam  Constitutional: She is oriented to person, place, and time. She appears well-nourished. No distress.  HENT:  Head: Normocephalic.  Eyes: EOM are normal. Pupils are equal, round, and reactive to light. No foreign body present in the right eye. No foreign body present in the left eye. Right conjunctiva is not injected. Right conjunctiva has no hemorrhage. Left conjunctiva is injected. Left conjunctiva has no hemorrhage.  Fundoscopic exam:  The right eye shows red reflex.       The left eye shows red reflex.  Slit lamp exam:      The right eye shows no hyphema.       The left eye shows no hyphema.  No bulging, increased tearing in left eye  Cardiovascular: Normal rate, regular rhythm and normal heart sounds.   Pulmonary/Chest: Effort normal and breath sounds normal.  Abdominal: Soft. Bowel sounds are normal.  Musculoskeletal: Normal range of motion.  Neurological: She is alert and oriented to person, place, and time.  Skin: Skin is warm and dry. She is not diaphoretic.  Psychiatric: She has a normal mood and affect. Thought content normal.    Data Review Lab Results  Component Value Date   HGBA1C 5.6 12/20/2013   HGBA1C 5.4 06/10/2013    Assessment & Plan   Eye Injury- start ketorolac solution. Place one drop in left eye four times a day for pain. If you are still having symptoms in one week, please come back to be re-checked. If symptoms worsen you can  return sooner or go to emergency room.   Hypertension- stable. Continue lisinopril, try to be active and exercise, and follow DASH diet.   Hyperlipidemia- continue the atorvastatin. We will check your cholesterol and other labs today.    Ankle Fracture - No need to refer to ortho at this time. If you have problems walking please let us know.    Patient have been counseled extensively about nutrition and exercise  Return in about 3 months (around 02/21/2017) for Routine Follow Up, Follow up HTN.  The patient was given clear instructions to go to ER or return to medical center if symptoms don't improve, worsen or new problems develop. The patient verbalized understanding. The patient was told to call to get lab results if they haven't heard anything in the next week.    Beckey Rutter, RN, BSN, [place] [facility], [place]   11/23/2016, 10:05 AM

## 2016-11-23 NOTE — Patient Instructions (Signed)
Abrasin corneal (Corneal Abrasion) La crnea es la cubierta transparente en la parte anterior y central del ojo. Cuando mira la parte colorida del ojo, est mirando a travs de la crnea. Es un tejido delgado formado por capas. La capa superior es la capa ms sensible. La abrasin corneal ocurre cuando esta capa sufre un rasguo o una lesin hace que se desprenda. CUIDADOS EN EL HOGAR  Pueden administrarle gotas o una crema con medicamento. Tome los [employer] le indique su mdico.  Es posible que le apliquen un parche de presin sobre el ojo. Si este es el caso, siga las instrucciones de su mdico respecto de cundo Engineer, manufacturing systems. No conduzca ni opere maquinaria mientras lleve puesto el parche. Es difcil juzgar las Geographical information systems officer.  Visite a su mdico para que le realice un examen de seguimiento, si as se lo indican. Es muy importante que cumpla con esta cita. SOLICITE AYUDA SI:  Tree surgeon, tiene sensibilidad a la luz y tiene una sensacin de picazn en un ojo o en ambos.  Su parche de presin se afloja continuamente. Puede parpadear debajo del parche.  Supura lquido del ojo o los prpados se pegan por la [place].  Siente por la [employer] mismos sntomas que sinti con la primera abrasin. Esto podra [employer], semanas o meses despus de que se cur de la primera abrasin. Esta informacin no tiene Marine scientist el consejo del mdico. Asegrese de hacerle al mdico cualquier pregunta que tenga. Document Released: 11/19/2010 Document Revised: 07/08/2015 Document Reviewed: 06/24/2013 Elsevier Interactive Patient Education  2017 [place] de alimentacin DASH (DASH Eating Plan) DASH es la sigla en ingls de "Enfoques Alimentarios para Detener la Hipertensin". El plan de alimentacin DASH ha demostrado bajar la presin arterial elevada (hipertensin). Los beneficios adicionales para la salud pueden incluir la disminucin del riesgo de  diabetes mellitus tipo2, enfermedades cardacas e ictus. Este plan tambin puede ayudar a Horticulturist, commercial. QU DEBO SABER ACERCA DEL PLAN DE ALIMENTACIN DASH? Para el plan de alimentacin DASH, seguir las siguientes pautas generales:  Elija los alimentos que contienen menos de 150 miligramos de sodio por porcin (segn se indica en la etiqueta de los alimentos).  Use hierbas o aderezos sin sal, en lugar de sal de mesa o sal marina.  Consulte al mdico o farmacutico antes de usar sustitutos de la sal.  Consuma los productos con menor contenido de sodio. Estos productos suelen estar etiquetados como "bajo en sodio" o "sin agregado de sal".  Coma alimentos frescos. No consuma una gran cantidad de alimentos enlatados.  Coma ms verduras, frutas y productos lcteos con bajo contenido de [place].  Elija los cereales integrales. Busque la palabra "integral" en Equities trader de la lista de ingredientes.  Elija el pescado y el pollo o el pavo sin piel ms a menudo que las carnes rojas. Limite el consumo de pescado, carne de ave y carne a 6onzas (170g) por Training and development officer.  Limite el consumo de dulces, postres, azcares y bebidas azucaradas.  Elija las grasas saludables para el corazn.  Consuma ms comida casera y menos de restaurante, de buf y comida rpida.  Limite el consumo de alimentos fritos.  No fra los alimentos. A la hora de cocinarlos, opte por hornearlos, hervirlos, grillarlos y asarlos a Administrator, arts.  Cuando coma en un restaurante, pida que preparen su comida con menos sal o, en lo posible, sin nada de sal. QU ALIMENTOS PUEDO COMER? Pida ayuda a un nutricionista para  conocer las necesidades calricas individuales. Cereales  Pan de salvado o integral. Arroz integral. Pastas de salvado o integrales. Quinua, trigo burgol y cereales integrales. Cereales con bajo contenido de sodio. Tortillas de harina de maz o de salvado. Pan de maz integral. Galletas saladas integrales. Galletas con  bajo contenido de [place]. Vegetales  Verduras frescas o congeladas (crudas, al vapor, asadas o grilladas). Jugos de tomate y verduras con contenido bajo o reducido de sodio. Pasta y salsa de tomate con contenido bajo o [place]. Verduras enlatadas con bajo contenido de sodio o reducido de sodio. Lambert Mody  Lambert Mody frescas, en conserva (en su jugo natural) o frutas congeladas. Carnes y otros productos con protenas  Carne de res molida (al 85% o ms [place]), carne de res de animales alimentados con pastos o carne de res sin la grasa. Pollo o pavo sin piel. Carne de pollo o de [place]. Cerdo sin la grasa. Todos los pescados y frutos de mar. Huevos. Porotos, guisantes o lentejas secos. Frutos secos y semillas sin sal. Frijoles enlatados sin sal. Lcteos  Productos lcteos con bajo contenido de grasas, como [place] o al 1%, quesos reducidos en grasas o al 2%, ricota con bajo contenido de grasas o [employer], o yogur natural con bajo contenido de [place]. Quesos con contenido bajo o reducido de sodio. Grasas y Advertising copywriter en barra que no contengan grasas trans. [place] y alios para ensaladas livianos o reducidos en grasas (reducidos en sodio). [place]. Aceites de crtamo, oliva o canola. [place] natural de man o almendra. Otros  Palomitas de maz y pretzels sin sal. Los artculos mencionados arriba pueden no ser [employer] Foods Company de las bebidas o los alimentos recomendados. Comunquese con el nutricionista para conocer ms opciones.  QU ALIMENTOS NO SE RECOMIENDAN? Cereales  Pan blanco. Pastas blancas. Arroz blanco. Pan de maz refinado. Bagels y croissants. Galletas saladas que contengan grasas trans. Vegetales  Vegetales con crema o fritos. Verduras en [place]. Verduras enlatadas comunes. Pasta y salsa de tomate en lata comunes. Jugos comunes de tomate y de verduras. Lambert Mody  Fruta enlatada en almbar liviano o espeso. Jugo de frutas. Carnes y otros  productos con protenas  Cortes de carne con Lobbyist. Costillas, alas de pollo, tocineta, salchicha, mortadela, salame, chinchulines, tocino, perros calientes, salchichas alemanas y embutidos envasados. Frutos secos y semillas con sal. Frijoles con sal en lata. Lcteos  Leche entera o al 2%, crema, mezcla de [place] y crema, y queso crema. Yogur entero o endulzado. Quesos o queso azul con alto contenido de Physicist, medical. [place]remas no lcteas y coberturas batidas. Quesos procesados, quesos para untar o cuajadas. Condimentos  Sal de cebolla y ajo, sal condimentada, sal de mesa y sal marina. Salsas en lata y envasadas. Salsa Worcestershire. Salsa trtara. Salsa barbacoa. Salsa teriyaki. Salsa de soja, incluso la que tiene contenido reducido de [place]. Salsa de carne. Salsa de pescado. Salsa de [place]. Salsa rosada. Rbano picante. Ketchup y mostaza. Saborizantes y tiernizantes para carne. Caldo en cubitos. Salsa picante. Salsa tabasco. Adobos. Aderezos para tacos. Salsas. Grasas y aceites  [place], [place] en barra, [place], [place], [place] clarificada y Wendee Copp de tocino. Aceites de coco, de palmiste o de palma. Aderezos comunes para ensalada. Otros  Pickles y [place]. Palomitas de maz y pretzels con sal. Los artculos mencionados arriba pueden no ser [employer] Company de las bebidas y los alimentos que se Higher education careers adviser. Comunquese con el nutricionista para obtener ms informacin.  DNDE PUEDO Linus Galas  MS INFORMACIN? [place], del Pulmn y de Herbalist (National Heart, Lung, and [place]): travelstabloid.com Esta informacin no tiene Marine scientist el consejo del mdico. Asegrese de hacerle al mdico cualquier pregunta que tenga. Document Released: 10/06/2011 Document Revised: 02/08/2016 Document Reviewed: 08/21/2013 Elsevier Interactive Patient Education  2017 [place]. Hipertensin (Hypertension) El trmino  hipertensin es otra forma de denominar a la presin arterial elevada. La presin arterial elevada fuerza al corazn a trabajar ms para bombear la sangre. Una lectura de la presin arterial consta de dos nmeros: uno ms alto sobre uno ms bajo (por ejemplo, 110/72). CUIDADOS EN EL HOGAR  Haga que el mdico le tome nuevamente la presin arterial.  Tome los medicamentos solamente como se lo haya indicado el mdico. Siga cuidadosamente las indicaciones. Los medicamentos pierden eficacia si omite dosis. El hecho de omitir las dosis tambin [place] el riesgo de otros problemas.  No fume.  Contrlese la presin arterial en su casa como se lo haya indicado el mdico. SOLICITE AYUDA SI:  Piensa que tiene una reaccin a los medicamentos que est tomando.  Tiene mareos o dolores de cabeza reiterados.  Se le inflaman (hinchan) los tobillos.  Tiene problemas de visin. SOLICITE AYUDA DE INMEDIATO SI:  Tiene un dolor de cabeza muy intenso y est confundido.  Se siente dbil, aturdido o se desmaya.  Tiene dolor en el pecho o el estmago (abdominal).  Tiene vmitos.  No puede respirar [employer]. ASEGRESE DE QUE:  Comprende estas instrucciones.  Controlar su afeccin.  Recibir ayuda de inmediato si no mejora o si empeora. Esta informacin no tiene Marine scientist el consejo del mdico. Asegrese de hacerle al mdico cualquier pregunta que tenga. Document Released: 04/06/2010 Document Revised: 10/22/2013 Document Reviewed: 08/09/2013 Elsevier Interactive Patient Education  2017 [employer].

## 2016-11-25 ENCOUNTER — Telehealth: Payer: Self-pay | Admitting: *Deleted

## 2016-11-25 NOTE — Telephone Encounter (Signed)
Medical Assistant used [employer] to contact patient.  Interpreter Name: Daisey Must Interpreter #: [phone] Patient was not available, [employer]preter left patient a voicemail. Voicemail states to give a call back to [place] with [facility] at [phone].

## 2016-11-25 NOTE — Telephone Encounter (Signed)
-----   Message from Tresa Garter, MD sent at 11/24/2016 10:19 AM EST ----- Please inform patient that her lab results are mostly normal except for the cholesterol level, high. Encourage patient to adhere with cholesterol medicine and also please limit saturated fat to no more than 7% of your calories, limit cholesterol to 200 mg/day, increase fiber and exercise as tolerated. If needed we may add another cholesterol lowering medication to your regimen.

## 2016-12-01 MED FILL — LISINOPRIL 10 MG TABLET: 10 | 30 days supply | Qty: 30 | Fill #0

## 2016-12-01 MED FILL — ATORVASTATIN 20 MG TABLET: 20 | 30 days supply | Qty: 30 | Fill #0

## 2016-12-01 MED FILL — OMEPRAZOLE DR 40 MG CAPSULE: 40 | 30 days supply | Qty: 30 | Fill #0

## 2017-01-02 MED FILL — LISINOPRIL 10 MG TABLET: 10 | 30 days supply | Qty: 30 | Fill #1

## 2017-01-02 MED FILL — ATORVASTATIN 20 MG TABLET: 20 | 30 days supply | Qty: 30 | Fill #1

## 2017-01-02 MED FILL — MELOXICAM 7.5 MG TABLET: 7.5 | 30 days supply | Qty: 30 | Fill #1

## 2017-02-08 ENCOUNTER — Encounter: Payer: Self-pay | Admitting: Internal Medicine

## 2017-02-08 ENCOUNTER — Ambulatory Visit: Payer: Self-pay | Attending: Internal Medicine | Admitting: Internal Medicine

## 2017-02-08 VITALS — BP 129/72 | HR 43 | Temp 98.5°F | Resp 18 | Ht 61.0 in | Wt 167.4 lb

## 2017-02-08 DIAGNOSIS — G8929 Other chronic pain: Secondary | ICD-10-CM | POA: Insufficient documentation

## 2017-02-08 DIAGNOSIS — D250 Submucous leiomyoma of uterus: Secondary | ICD-10-CM | POA: Insufficient documentation

## 2017-02-08 DIAGNOSIS — D509 Iron deficiency anemia, unspecified: Secondary | ICD-10-CM | POA: Insufficient documentation

## 2017-02-08 DIAGNOSIS — I10 Essential (primary) hypertension: Secondary | ICD-10-CM | POA: Insufficient documentation

## 2017-02-08 DIAGNOSIS — M546 Pain in thoracic spine: Secondary | ICD-10-CM | POA: Insufficient documentation

## 2017-02-08 DIAGNOSIS — Z7982 Long term (current) use of aspirin: Secondary | ICD-10-CM | POA: Insufficient documentation

## 2017-02-08 DIAGNOSIS — Z79899 Other long term (current) drug therapy: Secondary | ICD-10-CM | POA: Insufficient documentation

## 2017-02-08 DIAGNOSIS — F419 Anxiety disorder, unspecified: Secondary | ICD-10-CM | POA: Insufficient documentation

## 2017-02-08 DIAGNOSIS — E785 Hyperlipidemia, unspecified: Secondary | ICD-10-CM | POA: Insufficient documentation

## 2017-02-08 MED ORDER — MELOXICAM 7.5 MG PO TABS: 7.5000 mg | ORAL_TABLET | Freq: Every day | ORAL | 3 refills | Status: DC

## 2017-02-08 MED FILL — ?MELOXICAM 7.5 MG TABLET: 7.5 | 30 days supply | Qty: 30 | Fill #0

## 2017-02-08 MED FILL — ?LISINOPRIL 10 MG TABLET: 10 | 30 days supply | Qty: 30 | Fill #2

## 2017-02-08 MED FILL — ?ATORVASTATIN 20 MG TABLET: 20 | 30 days supply | Qty: 30 | Fill #2

## 2017-02-08 NOTE — Progress Notes (Signed)
Patient is here for FU Pain  Patient complains of back pain being present on the right side with abdominal pains. Patient states she has a tumor in this area.  Patient has taken medication today. Patient has eaten today.

## 2017-02-08 NOTE — Progress Notes (Signed)
Susan Lopez, is a 56 y.o. female  CWC:[id number]  VOH:[id number]  DOB - [birth year]  Chief Complaint  Patient presents with  . Back Pain  . Abdominal Pain      Subjective:   Susan Lopez is a 56 y.o. female with history of HTN, iron def anemia from chronic blood loss PV due to uterine fibroids, major depression and anxiety presents here today for a follow up visit. She complained of moderate mid-back pain and lower abdominal/suprapubic pain, not new, on and off for over one year. Pain is attributed to her uterine fibroids. She denies ongoing excessive bleeding per vaginam. She denies any recent fall or back injury. She reports no nausea, no vomiting, no diarrhea, no urinary symptom. No bowel or urinary incontinence. Patient has no chest pain, No new weakness tingling or numbness, No Cough - SOB.  Problem  Submucous Leiomyoma of Uterus  Chronic Thoracic Spine Pain   ALLERGIES: Allergies  Allergen Reactions  . Hydrocodone Nausea And Vomiting  . Tramadol Nausea Only  . Oxycodone Rash    PAST MEDICAL HISTORY: Past Medical History:  Diagnosis Date  . Anxiety   . Arthritis   . Bone lesion 10/04/2013  . Calculus of gallbladder with acute and chronic cholecystitis without obstruction, s/p lap chole [id number] 09/19/2011  . Complication of anesthesia   . Depression   . Gallstones   . Hemorrhoids, external 10-21-11   occ. bothersome  . Hypertension   . Iron deficiency anemia   . Mammogram abnormal 10/04/2013  . PONV (postoperative nausea and vomiting)     MEDICATIONS AT HOME: Prior to Admission medications   Medication Sig Start Date End Date Taking? Authorizing Provider  atorvastatin (LIPITOR) 20 MG tablet Take 1 tablet (20 mg total) by mouth daily. 11/23/16  Yes Tresa Garter, MD  gabapentin (NEURONTIN) 100 MG capsule Take 1 capsule (100 mg total) by mouth 2 (two) times daily. 11/23/16  Yes Tresa Garter, MD  ketorolac (ACULAR) 0.4 % SOLN Place  1 drop into the left eye 4 (four) times daily. 11/23/16  Yes Tresa Garter, MD  lisinopril (PRINIVIL,ZESTRIL) 10 MG tablet Take 1 tablet (10 mg total) by mouth daily. 11/23/16  Yes Tresa Garter, MD  meloxicam (MOBIC) 7.5 MG tablet Take 1 tablet (7.5 mg total) by mouth daily. 02/08/17  Yes Tresa Garter, MD  Multiple Vitamin (MULTIVITAMIN WITH MINERALS) TABS tablet Take 1 tablet by mouth daily.   Yes Historical Provider, MD  omeprazole (PRILOSEC) 40 MG capsule Take 1 capsule (40 mg total) by mouth daily. 11/23/16  Yes Tresa Garter, MD  Polyvinyl Alcohol-Povidone (REFRESH OP) Place 1 drop into both eyes 2 (two) times daily as needed.    Yes Historical Provider, MD  senna-docusate (SENOKOT S) 8.6-50 MG tablet Take 1 tablet by mouth at bedtime as needed. 11/13/15  Yes Naiping Ephriam Jenkins, MD  aspirin EC 325 MG tablet Take 1 tablet (325 mg total) by mouth 2 (two) times daily. Patient not taking: Reported on 10/07/2016 11/13/15   Leandrew Koyanagi, MD    Objective:   Vitals:   02/08/17 1112  BP: 129/72  Pulse: (!) 43  Resp: 18  Temp: 98.5 F (36.9 C)  TempSrc: Oral  SpO2: 95%  Weight: 167 lb 6.4 oz (75.9 kg)  Height: 5\' 1"  (1.549 m)   Exam General appearance : Awake, alert, not in any distress. Speech Clear. Not toxic looking HEENT: Atraumatic and Normocephalic, pupils equally reactive to light and accomodation  Neck: Supple, no JVD. No cervical lymphadenopathy.  Chest: Good air entry bilaterally, no added sounds  CVS: S1 S2 regular, no murmurs.  Abdomen: Bowel sounds present, Non tender and not distended with no gaurding, rigidity or rebound. Extremities: B/L Lower Ext shows no edema, both legs are warm to touch Neurology: Awake alert, and oriented X 3, CN II-XII intact, Non focal Skin: No Rash  Data Review Lab Results  Component Value Date   HGBA1C 5.6 12/20/2013   HGBA1C 5.4 06/10/2013    Assessment & Plan   1. Essential hypertension  We have discussed target BP  range and blood pressure goal. I have advised patient to check BP regularly and to call us back or report to clinic if the numbers are consistently higher than 140/90. We discussed the importance of compliance with medical therapy and DASH diet recommended, consequences of uncontrolled hypertension discussed.  - continue current BP medications  2. Dyslipidemia  To address this please limit saturated fat to no more than 7% of your calories, limit cholesterol to 200 mg/day, increase fiber and exercise as tolerated. If needed we may add another cholesterol lowering medication to your regimen.   3. Chronic thoracic spine pain  - meloxicam (MOBIC) 7.5 MG tablet; Take 1 tablet (7.5 mg total) by mouth daily.  Dispense: 30 tablet; Refill: 3 - MR Thoracic Spine Wo Contrast; Future  4. Submucous leiomyoma of uterus  - Patient has no bleeding but occasional pain, will hold off on Gynecology referral - Continue prn pain medication - Return if there is any bleeding or intractable pain   Patient have been counseled extensively about nutrition and exercise. Other issues discussed during this visit include: low cholesterol diet, weight control and daily exercise,importance of adherence with medications and regular follow-up. We also discussed long term complications of uncontrolled hypertension.   Return in about 6 months (around 08/10/2017) for Follow up HTN, Follow up Pain and comorbidities, Routine Follow Up.  The patient was given clear instructions to go to ER or return to medical center if symptoms don't improve, worsen or new problems develop. The patient verbalized understanding. The patient was told to call to get lab results if they haven't heard anything in the next week.   This note has been created with Surveyor, quantity. Any transcriptional errors are unintentional.    Angelica Chessman, MD, [place], Karilyn Cota, [place] and  [place], [place]   02/08/2017, 11:53 AM

## 2017-02-08 NOTE — Patient Instructions (Signed)
Dolor de espalda en adultos (Back Pain, Adult) El dolor de espalda es muy frecuente. A menudo mejora con el tiempo. La causa del dolor de espalda generalmente no es peligrosa. La [employer] de las personas puede aprender a Administrator, sports de espalda por s mismas. CUIDADOS EN EL HOGAR Controle su dolor de espalda a fin de Recruitment consultant cambio. Las siguientes indicaciones ayudarn a Education officer, community que pueda sentir:  Quarry manager. Comience con caminatas cortas sobre superficies planas si es posible. Trate de caminar un poco ms [place].  Haga ejercicios con regularidad tal como le indic el mdico. El ejercicio ayuda a que su espalda se cure ms rpidamente. Tambin ayuda a prevenir futuras lesiones al [employer] fuertes y flexibles.  No se siente, conduzca ni permanezca de pie durante ms de 30 minutos.  No permanezca en la cama. Si hace reposo ms de 1 a 2 das, puede demorar su recuperacin.  Sea cuidadoso al inclinarse o levantar un objeto. Use una tcnica apropiada para levantar peso:  [place].  Mantenga el objeto cerca del cuerpo.  No gire.  Duerma sobre un [employer]. Recustese sobre un costado y [place]. Si se recuesta [employer], coloque una almohada debajo de las rodillas.  Tome los medicamentos solamente como se lo haya indicado el mdico.  Aplique hielo sobre la zona lesionada.  Ponga el hielo en una bolsa plstica.  Coloque una toalla entre la piel y la bolsa de hielo.  Deje el hielo durante 13minutos, 2 a 3veces por da, durante los primeros 2 o 3das. Despus de eso, puede alternar entre compresas de hielo y Freight forwarder.  Evite sentir ansiedad o estrs. Encuentre maneras efectivas de lidiar con el estrs, Teacher, English as a foreign language ejercicio.  Mantenga un peso saludable. El peso excesivo ejerce tensin sobre la espalda. SOLICITE AYUDA SI:  Siente dolor que no se alivia con reposo o medicamentos.  Siente cada vez ms  dolor que se extiende a las piernas o los glteos.  El dolor no mejora en una semana.  Siente dolor por la noche.  Pierde peso.  Siente escalofros o fiebre. SOLICITE AYUDA DE INMEDIATO SI:  No puede controlar su materia fecal (heces) o el pis (orina).  Siente debilidad en las piernas o los brazos.  Siente prdida de la sensibilidad (adormecimiento) en las piernas o los brazos.  Tiene malestar estomacal (nuseas) o vomita.  Siente dolor de estmago (abdominal).  Siente que se desvanece (se desmaya). Esta informacin no tiene Marine scientist el consejo del mdico. Asegrese de hacerle al mdico cualquier pregunta que tenga. Document Released: 05/02/2011 Document Revised: 11/07/2014 Document Reviewed: 02/18/2014 Elsevier Interactive Patient Education  2017 [place]. Hipertensin Hypertension La hipertensin, conocida comnmente como presin arterial alta, se produce cuando la sangre bombea en las arterias con mucha fuerza. Las arterias son los vasos sanguneos que transportan la sangre desde el corazn al resto del cuerpo. La hipertensin hace que el corazn haga ms esfuerzo para Chiropodist y [employer] que las arterias se Teacher, music o Advertising account executive. La hipertensin no tratada o no controlada puede causar infarto de miocardio, accidentes cerebrovasculares, enfermedad renal y otros problemas. Una lectura de la presin arterial consiste de un nmero ms alto sobre un nmero ms bajo. En condiciones ideales, la presin arterial debe estar por debajo de 120/80. El primer nmero ("superior") es la presin sistlica. Es la medida de la presin de las arterias cuando el corazn late. El segundo nmero ("inferior") es la  presin diastlica. Es la medida de la presin en las arterias cuando el corazn se relaja. Cules son las causas? Se desconoce la causa de esta afeccin. Qu incrementa el riesgo? Algunos factores de riesgo de hipertensin estn bajo su control. Otros  no. Factores que puede [employer].  Tener diabetes mellitus tipo 2, colesterol alto, o ambos.  No hacer la cantidad suficiente de actividad fsica o ejercicio.  Tener sobrepeso.  Consumir mucha grasa, azcar, caloras o sal (sodio) en su dieta.  Beber alcohol en exceso. Factores que son difciles o imposibles de modificar   Tener enfermedad renal crnica.  Tener antecedentes familiares de presin arterial alta.  La edad. Los riesgos aumentan con la edad.  La raza. El riesgo es mayor para las Retail banker.  El sexo. Antes de los 45aos, los hombres corren ms [employer]. Despus de los 65aos, las mujeres corren ms [employer].  Tener apnea obstructiva del sueo.  El estrs. Cules son los signos o los sntomas? La presin arterial extremadamente alta (crisis hipertensiva) puede provocar:  Dolor de [place].  Ansiedad.  Falta de aire.  Hemorragia nasal.  Nuseas y vmitos.  Dolor de pecho intenso.  Una crisis de movimientos que no puede controlar (convulsiones). Cmo se diagnostica? Esta afeccin se diagnostica midiendo su presin arterial mientras se encuentra sentado, con el brazo apoyado sobre una superficie. El brazalete del tensimetro debe colocarse directamente sobre la piel de la parte superior del brazo y al nivel de su corazn. Debe medirla al [place] veces en el mismo brazo. Determinadas condiciones pueden causar una diferencia de presin arterial entre el brazo izquierdo y Insurance underwriter. Ciertos factores pueden provocar que las lecturas de la presin arterial sean inferiores o superiores a lo normal (elevadas) por un perodo corto de tiempo:  Si su presin arterial es ms alta cuando se encuentra en el consultorio del mdico que cuando la mide en su hogar, se denomina "hipertensin de bata blanca". La [employer] de las personas que tienen esta afeccin no deben ser [employer].  Si su presin arterial es ms alta en  el hogar que cuando se encuentra en el consultorio del mdico, se denomina "hipertensin enmascarada". La [employer] de las personas que tienen esta afeccin deben ser medicadas para Chief Technology Officer la presin arterial. Si tiene una lecturas de presin arterial alta durante una visita o si tiene presin arterial normal con otros factores de riesgo:  Es posible que se le pida que regrese Administrator, arts para volver a Chief Technology Officer su presin arterial.  Se le puede pedir que se controle la presin arterial en su casa durante 1 semana o ms. Si se le diagnostica hipertensin, es posible que se le realicen otros anlisis de sangre o estudios de diagnstico por imgenes para ayudar a su mdico a comprender su riesgo general de tener otras afecciones. Cmo se trata? Esta afeccin se trata haciendo cambios saludables en el estilo de vida, tales como ingerir alimentos saludables, realizar ms ejercicio y reducir el consumo de alcohol. El mdico puede recetarle medicamentos si los cambios en el estilo de vida no son suficientes para Child psychotherapist la presin arterial y si:  Su presin arterial sistlica est por encima de 130.  Su presin arterial diastlica est por encima de 80. La presin arterial deseada puede variar en funcin de las enfermedades, la edad y otros factores personales. Siga estas instrucciones en su casa: Comida y bebida   Siga una dieta con alto contenido de fibras  y [place], y con [place], Location manager agregada y grasas. Un ejemplo de plan alimenticio es la dieta DASH (Dietary Approaches to Stop Hypertension, Mtodos alimenticios para detener la hipertensin). Para alimentarse de esta manera:  Coma mucha fruta y [place]. Trate de que la mitad del plato de cada comida sea de frutas y verduras.  Coma cereales integrales, como pasta integral, arroz integral y pan integral. Llene aproximadamente un cuarto del plato con cereales integrales.  Coma y beba productos lcteos con bajo  contenido de grasa, como leche descremada o yogur bajo en grasas.  Evite la ingesta de cortes de carne grasa, carne procesada o curada, y carne de ave con piel. Llene aproximadamente un cuarto del plato con protenas magras, como pescado, pollo sin piel, frijoles, huevos y tofu.  Evite ingerir alimentos prehechos o procesados. En general, estos tienen mayor cantidad de sodio, azcar agregada y Wendee Copp.  Reduzca su ingesta diaria de sodio. La mayora de las personas que tienen hipertensin deben comer menos de 1500 mg de sodio por [employer].  Limite el consumo de alcohol a no ms de 1 medida por da si es mujer y no est Music therapist y a 2 medidas por da si es hombre. Una medida equivale a 12onzas de cerveza, 5onzas de vino o 1onzas de bebidas alcohlicas de alta graduacin. Estilo de vida   Trabaje con su mdico para mantener un peso saludable o Administrator, Civil Service. Pregntele cual es su peso recomendado.  Realice al menos 30 minutos de ejercicio que haga que se acelere su corazn (ejercicio Arboriculturist) la [employer] de la [place]. Estas actividades pueden incluir caminar, nadar o andar en bicicleta.  Incluya ejercicios para fortalecer sus msculos (ejercicios de resistencia), como pilates o levantamiento de pesas, como parte de su rutina semanal de ejercicios. Intente realizar 27minutos de este tipo de ejercicios al [employer] a la [place].  No consuma ningn producto que contenga nicotina o tabaco, como cigarrillos y Psychologist, sport and exercise. Si necesita ayuda para dejar de fumar, consulte al mdico.  Contrlese la presin arterial en su casa segn las indicaciones del mdico.  Concurra a todas las visitas de control como se lo haya indicado el mdico. Esto es importante. Medicamentos   [employer] de venta libre y los recetados solamente como se lo haya indicado el mdico. Siga cuidadosamente las indicaciones. Los medicamentos para la presin arterial deben tomarse segn las  indicaciones.  No omita las dosis de medicamentos para la presin arterial. Si lo hace, estar en riesgo de tener problemas y puede hacer que los medicamentos sean menos eficaces.  Pregntele a su mdico a qu efectos secundarios o reacciones a los Careers information officer. Comunquese con un mdico si:  Piensa que tiene una reaccin a un medicamento que est tomando.  Tiene dolores de cabeza frecuentes (recurrentes).  Siente mareos.  Tiene hinchazn en los tobillos.  Tiene problemas de visin. Solicite ayuda de inmediato si:  Siente un dolor de cabeza intenso o confusin.  Siente debilidad inusual o adormecimiento.  Siente que va a desmayarse.  Siente un dolor intenso en el pecho o el abdomen.  Vomita repetidas veces.  Tiene dificultad para respirar. Resumen  La hipertensin se produce cuando la sangre bombea en las arterias con mucha fuerza. Si esta afeccin no se controla, podra correr riesgo de tener complicaciones graves.  La presin arterial deseada puede variar en funcin de las enfermedades, la edad y otros factores personales. [place]  personas, una presin arterial normal es menor que 120/80.  La hipertensin se trata con cambios en el estilo de vida, medicamentos o una combinacin de [place]. Los [employer] Corporation estilo de vida incluyen prdida de peso, ingerir alimentos sanos, seguir una dieta baja en sodio, hacer ms ejercicio y Environmental consultant consumo de alcohol. Esta informacin no tiene Marine scientist el consejo del mdico. Asegrese de hacerle al mdico cualquier pregunta que tenga. Document Released: 10/17/2005 Document Revised: 09/28/2016 Document Reviewed: 09/28/2016 Elsevier Interactive Patient Education  2017 [employer].

## 2017-05-15 MED FILL — LISINOPRIL 10 MG TABLET: 10 | 30 days supply | Qty: 30 | Fill #3

## 2017-05-16 ENCOUNTER — Ambulatory Visit: Payer: Self-pay

## 2017-05-22 ENCOUNTER — Ambulatory Visit: Payer: Self-pay

## 2017-05-23 ENCOUNTER — Ambulatory Visit: Payer: Self-pay | Attending: Internal Medicine

## 2017-06-21 MED FILL — LISINOPRIL 10 MG TABLET: 10 | 30 days supply | Qty: 30 | Fill #4

## 2017-07-05 ENCOUNTER — Encounter: Payer: Self-pay | Admitting: Internal Medicine

## 2017-07-05 ENCOUNTER — Ambulatory Visit: Payer: Self-pay | Attending: Internal Medicine | Admitting: Internal Medicine

## 2017-07-05 VITALS — BP 115/75 | HR 86 | Temp 99.1°F | Resp 16 | Wt 169.2 lb

## 2017-07-05 DIAGNOSIS — G8929 Other chronic pain: Secondary | ICD-10-CM | POA: Insufficient documentation

## 2017-07-05 DIAGNOSIS — M546 Pain in thoracic spine: Secondary | ICD-10-CM | POA: Insufficient documentation

## 2017-07-05 DIAGNOSIS — Z7982 Long term (current) use of aspirin: Secondary | ICD-10-CM | POA: Insufficient documentation

## 2017-07-05 DIAGNOSIS — M25579 Pain in unspecified ankle and joints of unspecified foot: Secondary | ICD-10-CM | POA: Insufficient documentation

## 2017-07-05 DIAGNOSIS — I10 Essential (primary) hypertension: Secondary | ICD-10-CM | POA: Insufficient documentation

## 2017-07-05 DIAGNOSIS — R0683 Snoring: Secondary | ICD-10-CM | POA: Insufficient documentation

## 2017-07-05 DIAGNOSIS — Z79899 Other long term (current) drug therapy: Secondary | ICD-10-CM | POA: Insufficient documentation

## 2017-07-05 DIAGNOSIS — E785 Hyperlipidemia, unspecified: Secondary | ICD-10-CM | POA: Insufficient documentation

## 2017-07-05 DIAGNOSIS — G4733 Obstructive sleep apnea (adult) (pediatric): Secondary | ICD-10-CM | POA: Insufficient documentation

## 2017-07-05 MED ORDER — OMEPRAZOLE 40 MG PO CPDR: 40.0000 mg | DELAYED_RELEASE_CAPSULE | Freq: Every day | ORAL | 3 refills | Status: DC

## 2017-07-05 MED ORDER — LISINOPRIL 10 MG PO TABS: 10.0000 mg | ORAL_TABLET | Freq: Every day | ORAL | 3 refills | Status: DC

## 2017-07-05 MED ORDER — GABAPENTIN 100 MG PO CAPS: 100.0000 mg | ORAL_CAPSULE | Freq: Two times a day (BID) | ORAL | 3 refills | Status: DC

## 2017-07-05 MED ORDER — MELOXICAM 7.5 MG PO TABS: 7.5000 mg | ORAL_TABLET | Freq: Every day | ORAL | 3 refills | Status: DC

## 2017-07-05 MED ORDER — ATORVASTATIN CALCIUM 20 MG PO TABS: 20.0000 mg | ORAL_TABLET | Freq: Every day | ORAL | 3 refills | Status: DC

## 2017-07-05 NOTE — Patient Instructions (Signed)
Plan de alimentacin DASH (DASH Eating Plan) DASH es la sigla en ingls de "Enfoques Alimentarios para Detener la Hipertensin". El plan de alimentacin DASH ha demostrado bajar la presin arterial elevada (hipertensin). Los beneficios adicionales para la salud pueden incluir la disminucin del riesgo de diabetes mellitus tipo2, enfermedades cardacas e ictus. Este plan tambin puede ayudar a adelgazar. QU DEBO SABER ACERCA DEL PLAN DE ALIMENTACIN DASH? Para el plan de alimentacin DASH, seguir las siguientes pautas generales:  Elija los alimentos que contienen menos de 150 miligramos de sodio por porcin (segn se indica en la etiqueta de los alimentos).  Use hierbas o aderezos sin sal, en lugar de sal de mesa o sal marina.  Consulte al mdico o farmacutico antes de usar sustitutos de la sal.  Consuma los productos con menor contenido de sodio. Estos productos suelen estar etiquetados como "bajo en sodio" o "sin agregado de sal".  Coma alimentos frescos. No consuma una gran cantidad de alimentos enlatados.  Coma ms verduras, frutas y productos lcteos con bajo contenido de grasas.  Elija los cereales integrales. Busque la palabra "integral" en el primer lugar de la lista de ingredientes.  Elija el pescado y el pollo o el pavo sin piel ms a menudo que las carnes rojas. Limite el consumo de pescado, carne de ave y carne a 6onzas (170g) por da.  Limite el consumo de dulces, postres, azcares y bebidas azucaradas.  Elija las grasas saludables para el corazn.  Consuma ms comida casera y menos de restaurante, de buf y comida rpida.  Limite el consumo de alimentos fritos.  No fra los alimentos. A la hora de cocinarlos, opte por hornearlos, hervirlos, grillarlos y asarlos a la parrilla.  Cuando coma en un restaurante, pida que preparen su comida con menos sal o, en lo posible, sin nada de sal. QU ALIMENTOS PUEDO COMER? Pida ayuda a un nutricionista para conocer las  necesidades calricas individuales. Cereales Pan de salvado o integral. Arroz integral. Pastas de salvado o integrales. Quinua, trigo burgol y cereales integrales. Cereales con bajo contenido de sodio. Tortillas de harina de maz o de salvado. Pan de maz integral. Galletas saladas integrales. Galletas con bajo contenido de sodio. Vegetales Verduras frescas o congeladas (crudas, al vapor, asadas o grilladas). Jugos de tomate y verduras con contenido bajo o reducido de sodio. Pasta y salsa de tomate con contenido bajo o reducido de sodio. Verduras enlatadas con bajo contenido de sodio o reducido de sodio. Frutas Frutas frescas, en conserva (en su jugo natural) o frutas congeladas. Carnes y otros productos con protenas Carne de res molida (al 85% o ms magra), carne de res de animales alimentados con pastos o carne de res sin la grasa. Pollo o pavo sin piel. Carne de pollo o de pavo molida. Cerdo sin la grasa. Todos los pescados y frutos de mar. Huevos. Porotos, guisantes o lentejas secos. Frutos secos y semillas sin sal. Frijoles enlatados sin sal. Lcteos Productos lcteos con bajo contenido de grasas, como leche descremada o al 1%, quesos reducidos en grasas o al 2%, ricota con bajo contenido de grasas o queso cottage, o yogur natural con bajo contenido de grasas. Quesos con contenido bajo o reducido de sodio. Grasas y aceites Margarinas en barra que no contengan grasas trans. [place] y alios para ensaladas livianos o reducidos en grasas (reducidos en sodio). [place]. Aceites de crtamo, oliva o canola. [place] natural de man o almendra. Otros Palomitas de maz y pretzels sin sal. Los artculos mencionados arriba pueden no   ser [employer] de las bebidas o los alimentos recomendados. Comunquese con el nutricionista para conocer ms opciones. QU ALIMENTOS NO SE RECOMIENDAN? Cereales Pan blanco. Pastas blancas. Arroz blanco. Pan de maz refinado. Bagels y croissants. Galletas  saladas que contengan grasas trans. Vegetales Vegetales con crema o fritos. Verduras en [place]. Verduras enlatadas comunes. Pasta y salsa de tomate en lata comunes. Jugos comunes de tomate y de verduras. Lambert Mody Fruta enlatada en almbar liviano o espeso. Jugo de frutas. Carnes y otros productos con protenas [place] de carne con Lobbyist. Costillas, alas de pollo, tocineta, salchicha, mortadela, salame, chinchulines, tocino, perros calientes, salchichas alemanas y embutidos envasados. Frutos secos y semillas con sal. Frijoles con sal en lata. Lcteos Leche entera o al 2%, crema, mezcla de [place] y crema, y queso crema. Yogur entero o endulzado. Quesos o queso azul con alto contenido de Physicist, medical. [place]remas no lcteas y coberturas batidas. Quesos procesados, quesos para untar o cuajadas. Condimentos Sal de cebolla y ajo, sal condimentada, sal de mesa y sal marina. Salsas en lata y envasadas. Salsa [place]. Salsa trtara. Salsa barbacoa. Salsa teriyaki. Salsa de soja, incluso la que tiene contenido reducido de [place]. Salsa de carne. Salsa de pescado. Salsa de [place]. Salsa rosada. Rbano picante. Ketchup y mostaza. Saborizantes y tiernizantes para carne. Caldo en cubitos. Salsa picante. Salsa tabasco. Adobos. Aderezos para tacos. Salsas. Grasas y aceites [place], [place] en barra, [place] de [place], [place], [place] clarificada y Wendee Copp de tocino. Aceites de coco, de palmiste o de palma. Aderezos comunes para ensalada. Otros Pickles y [place]. Palomitas de maz y pretzels con sal. Los artculos mencionados arriba pueden no ser [employer] Company de las bebidas y los alimentos que se Higher education careers adviser. Comunquese con el nutricionista para obtener ms informacin. DNDE Dolan Amen MS INFORMACIN? [place], del Pulmn y de Herbalist (National Heart, Lung, and [place]): travelstabloid.com Esta informacin no tiene Hydrologist el consejo del mdico. Asegrese de hacerle al mdico cualquier pregunta que tenga. Document Released: 10/06/2011 Document Revised: 02/08/2016 Document Reviewed: 08/21/2013 Elsevier Interactive Patient Education  2017 [place]. Hipertensin Hypertension La hipertensin, conocida comnmente como presin arterial alta, se produce cuando la sangre bombea en las arterias con mucha fuerza. Las arterias son los vasos sanguneos que transportan la sangre desde el corazn al resto del cuerpo. La hipertensin hace que el corazn haga ms esfuerzo para Chiropodist y [employer] Corporation que las arterias se Teacher, music o Advertising account executive. La hipertensin no tratada o no controlada puede causar infarto de miocardio, accidentes cerebrovasculares, enfermedad renal y otros problemas. Una lectura de la presin arterial consiste de un nmero ms alto sobre un nmero ms bajo. En condiciones ideales, la presin arterial debe estar por debajo de 120/80. El primer nmero ("superior") es la presin sistlica. Es la medida de la presin de las arterias cuando el corazn late. El segundo nmero ("inferior") es la presin diastlica. Es la medida de la presin en las arterias cuando el corazn se relaja. Cules son las causas? Se desconoce la causa de esta afeccin. Qu incrementa el riesgo? Algunos factores de riesgo de hipertensin estn bajo su control. Otros no. Factores que puede [employer] Corporation.  Tener diabetes mellitus tipo 2, colesterol alto, o ambos.  No hacer la cantidad suficiente de actividad fsica o ejercicio.  Tener sobrepeso.  Consumir mucha grasa, azcar, caloras o sal (sodio) en su dieta.  Beber alcohol en exceso. Factores que son difciles o imposibles de Field seismologist  enfermedad renal crnica.  Tener antecedentes familiares de presin arterial alta.  La edad. Los riesgos aumentan con la edad.  La raza. El riesgo es mayor para las Retail banker.  El sexo. Antes de  los 45aos, los hombres corren ms [employer]. Despus de los 65aos, las mujeres corren ms [employer].  Tener apnea obstructiva del sueo.  El estrs. Cules son los signos o los sntomas? La presin arterial extremadamente alta (crisis hipertensiva) puede provocar:  Dolor de [place].  Ansiedad.  Falta de aire.  Hemorragia nasal.  Nuseas y vmitos.  Dolor de pecho intenso.  Una crisis de movimientos que no puede controlar (convulsiones).  Cmo se diagnostica? Esta afeccin se diagnostica midiendo su presin arterial mientras se encuentra sentado, con el brazo apoyado sobre una superficie. El brazalete del tensimetro debe colocarse directamente sobre la piel de la parte superior del brazo y al nivel de su corazn. Debe medirla al [facility] veces en el mismo brazo. Determinadas condiciones pueden causar una diferencia de presin arterial entre el brazo izquierdo y Insurance underwriter. Ciertos factores pueden provocar que las lecturas de la presin arterial sean inferiores o superiores a lo normal (elevadas) por un perodo corto de tiempo:  Si su presin arterial es ms alta cuando se encuentra en el consultorio del mdico que cuando la mide en su hogar, se denomina "hipertensin de bata blanca". La State Farm de las personas que tienen esta afeccin no deben ser [employer].  Si su presin arterial es ms alta en el hogar que cuando se encuentra en el consultorio del mdico, se denomina "hipertensin enmascarada". La [employer] de las personas que tienen esta afeccin deben ser medicadas para Chief Technology Officer la presin arterial.  Si tiene una lecturas de presin arterial alta durante una visita o si tiene presin arterial normal con otros factores de riesgo:  Es posible que se le pida que regrese Administrator, arts para volver a Chief Technology Officer su presin arterial.  Se le puede pedir que se controle la presin arterial en su casa durante 1 semana o ms.  Si se le diagnostica hipertensin,  es posible que se le realicen otros anlisis de sangre o estudios de diagnstico por imgenes para ayudar a su mdico a comprender su riesgo general de tener otras afecciones. Cmo se trata? Esta afeccin se trata haciendo cambios saludables en el estilo de vida, tales como ingerir alimentos saludables, realizar ms ejercicio y reducir el consumo de alcohol. El mdico puede recetarle medicamentos si los cambios en el estilo de vida no son suficientes para Child psychotherapist la presin arterial y si:  Su presin arterial sistlica est por encima de 130.  Su presin arterial diastlica est por encima de 80.  La presin arterial deseada puede variar en funcin de las enfermedades, la edad y otros factores personales. Siga estas instrucciones en su casa: Comida y bebida  Siga una dieta con alto contenido de fibras y [place], y con bajo contenido de sodio, Location manager agregada y Physicist, medical. Un ejemplo de plan alimenticio es la dieta DASH (Dietary Approaches to Stop Hypertension, Mtodos alimenticios para detener la hipertensin). Para alimentarse de esta manera: ? Coma mucha fruta y [place]. Trate de que la mitad del plato de cada comida sea de frutas y verduras. ? Coma cereales integrales, como pasta integral, arroz integral y pan integral. Llene aproximadamente un cuarto del plato con cereales integrales. ? Coma y beba productos lcteos con bajo contenido de grasa, como leche descremada o yogur bajo en grasas. ?  Evite la ingesta de cortes de carne grasa, carne procesada o curada, y carne de ave con piel. Llene aproximadamente un cuarto del plato con protenas magras, como pescado, pollo sin piel, frijoles, huevos y tofu. ? Evite ingerir alimentos prehechos o procesados. En general, estos tienen mayor cantidad de sodio, azcar agregada y Wendee Copp.  Reduzca su ingesta diaria de sodio. La mayora de las personas que tienen hipertensin deben comer menos de 1500 mg de sodio por [employer].  Limite el consumo de  alcohol a no ms de 1 medida por da si es mujer y no est Music therapist y a 2 medidas por da si es hombre. Una medida equivale a 12onzas de cerveza, 5onzas de vino o 1onzas de bebidas alcohlicas de alta graduacin. Estilo de vida  Trabaje con su mdico para mantener un peso saludable o Administrator, Civil Service. Pregntele cual es su peso recomendado.  Realice al menos 30 minutos de ejercicio que haga que se acelere su corazn (ejercicio Arboriculturist) la Hartford Financial de la Sparkman. Estas actividades pueden incluir caminar, nadar o andar en bicicleta.  Incluya ejercicios para fortalecer sus msculos (ejercicios de resistencia), como pilates o levantamiento de pesas, como parte de su rutina semanal de ejercicios. Intente realizar 49minutos de este tipo de ejercicios al [employer] Corporation a la [place].  No consuma ningn producto que contenga nicotina o tabaco, como cigarrillos y Psychologist, sport and exercise. Si necesita ayuda para dejar de fumar, consulte al mdico.  Contrlese la presin arterial en su casa segn las indicaciones del mdico.  Concurra a todas las visitas de control como se lo haya indicado el mdico. Esto es importante. Medicamentos  Delphi de venta libre y los recetados solamente como se lo haya indicado el mdico. Siga cuidadosamente las indicaciones. Los medicamentos para la presin arterial deben tomarse segn las indicaciones.  No omita las dosis de medicamentos para la presin arterial. Si lo hace, estar en riesgo de tener problemas y puede hacer que los medicamentos sean menos eficaces.  Pregntele a su mdico a qu efectos secundarios o reacciones a los Careers information officer. Comunquese con un mdico si:  Piensa que tiene una reaccin a un medicamento que est tomando.  Tiene dolores de cabeza frecuentes (recurrentes).  Siente mareos.  Tiene hinchazn en los tobillos.  Tiene problemas de visin. Solicite ayuda de inmediato si:  Siente un dolor  de cabeza intenso o confusin.  Siente debilidad inusual o adormecimiento.  Siente que va a desmayarse.  Siente un dolor intenso en el pecho o el abdomen.  Vomita repetidas veces.  Tiene dificultad para respirar. Resumen  La hipertensin se produce cuando la sangre bombea en las arterias con mucha fuerza. Si esta afeccin no se controla, podra correr riesgo de tener complicaciones graves.  La presin arterial deseada puede variar en funcin de las enfermedades, la edad y otros factores personales. Para la [employer], una presin arterial normal es menor que 120/80.  La hipertensin se trata con cambios en el estilo de vida, medicamentos o una combinacin de [place]. Los McDonald's Corporation estilo de vida incluyen prdida de peso, ingerir alimentos sanos, seguir una dieta baja en sodio, hacer ms ejercicio y Environmental consultant consumo de alcohol. Esta informacin no tiene Marine scientist el consejo del mdico. Asegrese de hacerle al mdico cualquier pregunta que tenga. Document Released: 10/17/2005 Document Revised: 09/28/2016 Document Reviewed: 09/28/2016 Elsevier Interactive Patient Education  [employer].

## 2017-07-05 NOTE — Progress Notes (Signed)
Susan Lopez, is a 56 y.o. female  NLG:[id number]  [id number]  DOB - 11-07-1960  Chief Complaint  Patient presents with  . Back Pain       Subjective:   Susan Lopez is a 56 y.o. female with history of HTN, iron def anemia from chronic blood loss PV due to uterine fibroids, major depression and anxiety here today for a follow up visit. Patient complains of chronic thoracic back pain and states that she was never called to scheduled her MRI last year when it was ordered. Also admits to occassional vaginal bleeding, had a very small amount of bleeding two days ago, she follows up with OBGYN for uterine fibroids. Also complains of swelling in her uvula and a sore throat when waking up in the morning. Admits to snoring. Patient has No headache, No chest pain, No abdominal pain - No Nausea, No new weakness tingling or numbness, No Cough - SOB.  Problem  Snoring  Osa (Obstructive Sleep Apnea)    ALLERGIES: Allergies  Allergen Reactions  . Hydrocodone Nausea And Vomiting  . Tramadol Nausea Only  . Oxycodone Rash    PAST MEDICAL HISTORY: Past Medical History:  Diagnosis Date  . Anxiety   . Arthritis   . Bone lesion 10/04/2013  . Calculus of gallbladder with acute and chronic cholecystitis without obstruction, s/p lap chole [id number] 09/19/2011  . Complication of anesthesia   . Depression   . Gallstones   . Hemorrhoids, external 10-21-11   occ. bothersome  . Hypertension   . Iron deficiency anemia   . Mammogram abnormal 10/04/2013  . PONV (postoperative nausea and vomiting)     MEDICATIONS AT HOME: Prior to Admission medications   Medication Sig Start Date End Date Taking? Authorizing Provider  aspirin EC 325 MG tablet Take 1 tablet (325 mg total) by mouth 2 (two) times daily. Patient not taking: Reported on 10/07/2016 11/13/15   Leandrew Koyanagi, MD  atorvastatin (LIPITOR) 20 MG tablet Take 1 tablet (20 mg total) by mouth daily. 07/05/17   Tresa Garter, MD  gabapentin (NEURONTIN) 100 MG capsule Take 1 capsule (100 mg total) by mouth 2 (two) times daily. 07/05/17   Tresa Garter, MD  ketorolac (ACULAR) 0.4 % SOLN Place 1 drop into the left eye 4 (four) times daily. 11/23/16   Tresa Garter, MD  lisinopril (PRINIVIL,ZESTRIL) 10 MG tablet Take 1 tablet (10 mg total) by mouth daily. 07/05/17   Tresa Garter, MD  meloxicam (MOBIC) 7.5 MG tablet Take 1 tablet (7.5 mg total) by mouth daily. 07/05/17   Tresa Garter, MD  Multiple Vitamin (MULTIVITAMIN WITH MINERALS) TABS tablet Take 1 tablet by mouth daily.    [provider]  omeprazole (PRILOSEC) 40 MG capsule Take 1 capsule (40 mg total) by mouth daily. 07/05/17   Tresa Garter, MD  Polyvinyl Alcohol-Povidone (REFRESH OP) Place 1 drop into both eyes 2 (two) times daily as needed.     [provider]  senna-docusate (SENOKOT S) 8.6-50 MG tablet Take 1 tablet by mouth at bedtime as needed. Patient not taking: Reported on 07/05/2017 11/13/15   Leandrew Koyanagi, MD    Objective:   Vitals:   07/05/17 1125  BP: 115/75  Pulse: 86  Resp: 16  Temp: 99.1 F (37.3 C)  TempSrc: Oral  SpO2: 96%  Weight: 169 lb 3.2 oz (76.7 kg)   Exam General appearance : Awake, alert, not in any distress. Speech Clear. Not toxic  looking, obese HEENT: Atraumatic and Normocephalic, pupils equally reactive to light and accomodation. Slightly enlarged uvula.  Neck: Supple, no JVD. No cervical lymphadenopathy.  Chest: Good air entry bilaterally, no added sounds  CVS: S1 S2 regular, no murmurs.  Abdomen: Bowel sounds present, Non tender and not distended with no gaurding, rigidity or rebound. Extremities: B/L Lower Ext shows no edema, both legs are warm to touch Neurology: Awake alert, and oriented X 3, CN II-XII intact, Non focal. Right thoracic back tenderness on palpation   Skin: No Rash  Data Review Lab Results  Component Value Date   HGBA1C 5.6 12/20/2013    HGBA1C 5.4 06/10/2013    Assessment & Plan   1. Essential hypertension  - lisinopril (PRINIVIL,ZESTRIL) 10 MG tablet; Take 1 tablet (10 mg total) by mouth daily.  Dispense: 90 tablet; Refill: 3 We have discussed target BP range and blood pressure goal. I have advised patient to check BP regularly and to call us back or report to clinic if the numbers are consistently higher than 140/90. We discussed the importance of compliance with medical therapy and DASH diet recommended, consequences of uncontrolled hypertension discussed.  - continue current BP medications  2. Chronic thoracic spine pain  - meloxicam (MOBIC) 7.5 MG tablet; Take 1 tablet (7.5 mg total) by mouth daily.  Dispense: 30 tablet; Refill: 3 - MR THORACIC SPINE W WO CONTRAST; Future  3. Dyslipidemia  - atorvastatin (LIPITOR) 20 MG tablet; Take 1 tablet (20 mg total) by mouth daily.  Dispense: 90 tablet; Refill: 3  4. Snoring  - Split night study; Future  5. OSA (obstructive sleep apnea)  - Split night study; Future  6. Pain in joint, ankle and foot, unspecified laterality  - gabapentin (NEURONTIN) 100 MG capsule; Take 1 capsule (100 mg total) by mouth 2 (two) times daily.  Dispense: 180 capsule; Refill: 3 - omeprazole (PRILOSEC) 40 MG capsule; Take 1 capsule (40 mg total) by mouth daily.  Dispense: 90 capsule; Refill: 3  Patient have been counseled extensively about nutrition and exercise. Other issues discussed during this visit include: low cholesterol diet, weight control and daily exercise  Interpreter was used to communicate directly with patient for the entire encounter including providing detailed patient instructions.   Return in about 3 months (around 10/04/2017) for Follow up HTN, Follow up Pain and comorbidities, Pap Smear.  The patient was given clear instructions to go to ER or return to medical center if symptoms don't improve, worsen or new problems develop. The patient verbalized understanding. The  patient was told to call to get lab results if they haven't heard anything in the next week.   This note has been created with Surveyor, quantity. Any transcriptional errors are unintentional.    Angelica Chessman, MD, [place], [place], [place], [place] and [place], [place]   07/05/2017, 12:06 PM

## 2017-07-10 ENCOUNTER — Ambulatory Visit (HOSPITAL_COMMUNITY): Admission: RE | Admit: 2017-07-10 | Payer: Self-pay | Source: Ambulatory Visit

## 2017-07-21 MED FILL — LISINOPRIL 10 MG TABS: 10 | 30 days supply | Qty: 30 | Fill #5

## 2017-07-21 MED FILL — ?ATORVASTATIN 20 MG TABLET: 20 | 30 days supply | Qty: 30 | Fill #0

## 2017-08-02 ENCOUNTER — Telehealth: Payer: Self-pay | Admitting: Internal Medicine

## 2017-08-02 NOTE — Telephone Encounter (Signed)
Pt called to request a referral for Mammogram, please follow up

## 2017-08-07 ENCOUNTER — Other Ambulatory Visit: Payer: Self-pay | Admitting: Internal Medicine

## 2017-08-07 DIAGNOSIS — Z1239 Encounter for other screening for malignant neoplasm of breast: Secondary | ICD-10-CM

## 2017-08-07 NOTE — Telephone Encounter (Signed)
Ordered

## 2017-08-08 NOTE — Telephone Encounter (Signed)
Medical Assistant used [employer] to contact patient.  Interpreter Name:Adam Interpreter #: [phone] Patient was not available, [employer] Interpreter left patient a voicemail. Voicemail states to give a call back to [place] with [facility] at [phone]. !!! Patients mammogram was ordered, patient may call [phone] to schedule appointment!!!

## 2017-08-11 ENCOUNTER — Ambulatory Visit (HOSPITAL_COMMUNITY)
Admission: RE | Admit: 2017-08-11 | Discharge: 2017-08-11 | Disposition: A | Discharge status: Home / Self Care | Payer: Self-pay | Source: Ambulatory Visit | Attending: Internal Medicine | Admitting: Internal Medicine

## 2017-08-11 DIAGNOSIS — G8929 Other chronic pain: Secondary | ICD-10-CM | POA: Insufficient documentation

## 2017-08-11 DIAGNOSIS — M5124 Other intervertebral disc displacement, thoracic region: Secondary | ICD-10-CM | POA: Insufficient documentation

## 2017-08-11 DIAGNOSIS — M546 Pain in thoracic spine: Secondary | ICD-10-CM

## 2017-08-29 ENCOUNTER — Ambulatory Visit (HOSPITAL_BASED_OUTPATIENT_CLINIC_OR_DEPARTMENT_OTHER): Payer: Self-pay | Attending: Internal Medicine | Admitting: Internal Medicine

## 2017-08-29 DIAGNOSIS — R0683 Snoring: Secondary | ICD-10-CM | POA: Insufficient documentation

## 2017-08-29 DIAGNOSIS — G4733 Obstructive sleep apnea (adult) (pediatric): Secondary | ICD-10-CM | POA: Insufficient documentation

## 2017-09-01 ENCOUNTER — Other Ambulatory Visit: Payer: Self-pay | Admitting: Obstetrics and Gynecology

## 2017-09-01 DIAGNOSIS — Z1231 Encounter for screening mammogram for malignant neoplasm of breast: Secondary | ICD-10-CM

## 2017-09-01 MED FILL — OMEPRAZOLE DR 40 MG CAPSULE: 40 | 30 days supply | Qty: 30 | Fill #0

## 2017-09-01 MED FILL — ?ATORVASTATIN 20 MG TABLET: 20 | 30 days supply | Qty: 30 | Fill #1

## 2017-09-01 MED FILL — LISINOPRIL 10 MG TABS: 10 | 30 days supply | Qty: 30 | Fill #6

## 2017-09-09 DIAGNOSIS — R0683 Snoring: Secondary | ICD-10-CM

## 2017-09-09 NOTE — Procedures (Signed)
Patient Name: Susan Lopez, Susan Lopez Date: 08/29/2017 Gender: Female D.O.B: [birth year] Age (years): 56 Referring Provider: Angelica Chessman Height (inches): 62 Interpreting Physician: Baird Lyons MD, ABSM Weight (lbs): 168 RPSGT: Madelon Lips BMI: 31 MRN: [id number] Neck Size: 14.50 CLINICAL INFORMATION Sleep Study Type: Split Night CPAP  Indication for sleep study: OSA, Snoring  Epworth Sleepiness Score: 10  SLEEP STUDY TECHNIQUE As per the AASM Manual for the Scoring of Sleep and Associated Events v2.3 (April 2016) with a hypopnea requiring 4% desaturations.  The channels recorded and monitored were frontal, central and occipital EEG, electrooculogram (EOG), submentalis EMG (chin), nasal and oral airflow, thoracic and abdominal wall motion, anterior tibialis EMG, snore microphone, electrocardiogram, and pulse oximetry. Continuous positive airway pressure (CPAP) was initiated when the patient met split night criteria and was titrated according to treat sleep-disordered breathing.  MEDICATIONS Medications self-administered by patient taken the night of the study : LISINOPRIL  RESPIRATORY PARAMETERS Diagnostic  Total AHI (/hr): 32.5 RDI (/hr): 37.1 OA Index (/hr): 5.7 CA Index (/hr): 0.4 REM AHI (/hr): 69.3 NREM AHI (/hr): 26.5 Supine AHI (/hr): 38.1 Non-supine AHI (/hr): 13.48 Min O2 Sat (%): 80.00 Mean O2 (%): 94.35 Time below 88% (min): 4.0   Titration  Optimal Pressure (cm): 14 AHI at Optimal Pressure (/hr): 11.2 Min O2 at Optimal Pressure (%): 91.0 Supine % at Optimal (%): 100 Sleep % at Optimal (%): 98    SLEEP ARCHITECTURE The recording time for the entire night was 419.7 minutes.  During a baseline period of 171.7 minutes, the patient slept for 158.6 minutes in REM and nonREM, yielding a sleep efficiency of 92.3%. Sleep onset after lights out was 6.7 minutes with a REM latency of 69.0 minutes. The patient spent 7.25% of the night in stage N1 sleep, 59.00% in  stage N2 sleep, 19.55% in stage N3 and 14.19% in REM.  During the titration period of 243.7 minutes, the patient slept for 229.6 minutes in REM and nonREM, yielding a sleep efficiency of 94.2%. Sleep onset after CPAP initiation was 2.7 minutes with a REM latency of 87.0 minutes. The patient spent 5.66% of the night in stage N1 sleep, 62.07% in stage N2 sleep, 3.48% in stage N3 and 28.78% in REM.  CARDIAC DATA The 2 lead EKG demonstrated sinus rhythm. The mean heart rate was 64.65 beats per minute. Other EKG findings include: None.  LEG MOVEMENT DATA The total Periodic Limb Movements of Sleep (PLMS) were 0. The PLMS index was 0.00 .  IMPRESSIONS - Severe obstructive sleep apnea occurred during the diagnostic portion of the study (AHI = 32.5/hour).  - Incomplete titration was achieved at CPAP 14 within available time.  - No significant central sleep apnea occurred during the diagnostic portion of the study (CAI = 0.4/hour). - Moderate oxygen desaturation was noted during the diagnostic portion of the study (Min O2 =80.00%). - The patient snored with loud snoring volume during the diagnostic portion of the study. - No cardiac abnormalities were noted during this study. - Clinically significant periodic limb movements did not occur during sleep.  DIAGNOSIS - Obstructive Sleep Apnea (327.23 [G47.33 ICD-10])  RECOMMENDATIONS - Suggest home therapy with an initial fixed CPAP 16, or autoPAP 10-20. Otherwise recommend return for full CPAP titration.. - A  Medium size Resmed Full Face Mask AirFit F20 mask and heated humidification were used. - Be careful with alcohol, sedatives and other CNS depressants that may worsen sleep apnea and disrupt normal sleep architecture. - Sleep hygiene should be reviewed to  assess factors that may improve sleep quality. - Weight management and regular exercise should be initiated or continued.  [Electronically signed] 09/09/2017 01:58 PM  Baird Lyons MD,  [place], [place] Board of Sleep Medicine   NPI: [id number]

## 2017-09-13 ENCOUNTER — Telehealth: Payer: Self-pay | Admitting: *Deleted

## 2017-09-13 NOTE — Telephone Encounter (Signed)
Tresa Garter, MD  Franki Cabot K, CMA        MRI showed Unchanged tiny right disc protrusion at T8-T9. No associated stenosis/narrowing. Otherwise normal thoracic spine MRI    Unable to leave voicemail d/t mailox being full.

## 2017-09-13 NOTE — Telephone Encounter (Signed)
CMA call patient regarding MRI results   Patient did not answer on # [phone] but left a VM stating the reason of the call   Call on # [phone] son in law answer & I left a message with him to call back regarding her results

## 2017-09-13 NOTE — Telephone Encounter (Signed)
-----   Message from Trecia Rogers, [place] sent at 09/13/2017  2:47 PM EST ----- Please review labs with spanish speaking patient

## 2017-09-17 ENCOUNTER — Other Ambulatory Visit: Payer: Self-pay | Admitting: Internal Medicine

## 2017-09-17 DIAGNOSIS — G4733 Obstructive sleep apnea (adult) (pediatric): Secondary | ICD-10-CM

## 2017-09-28 ENCOUNTER — Ambulatory Visit
Admission: RE | Admit: 2017-09-28 | Discharge: 2017-09-28 | Disposition: A | Discharge status: Home / Self Care | Payer: No Typology Code available for payment source | Source: Ambulatory Visit | Attending: Obstetrics and Gynecology | Admitting: Obstetrics and Gynecology

## 2017-09-28 ENCOUNTER — Ambulatory Visit
Admission: RE | Admit: 2017-09-28 | Discharge: 2017-09-28 | Disposition: A | Discharge status: Home / Self Care | Payer: Self-pay | Source: Ambulatory Visit | Attending: Obstetrics and Gynecology | Admitting: Obstetrics and Gynecology

## 2017-09-28 ENCOUNTER — Other Ambulatory Visit (HOSPITAL_COMMUNITY): Payer: Self-pay | Admitting: *Deleted

## 2017-09-28 ENCOUNTER — Ambulatory Visit (HOSPITAL_COMMUNITY)
Admission: RE | Admit: 2017-09-28 | Discharge: 2017-09-28 | Disposition: A | Discharge status: Home / Self Care | Payer: Self-pay | Source: Ambulatory Visit | Attending: Obstetrics and Gynecology | Admitting: Obstetrics and Gynecology

## 2017-09-28 ENCOUNTER — Encounter (HOSPITAL_COMMUNITY): Payer: Self-pay

## 2017-09-28 VITALS — BP 116/70 | Temp 98.4°F | Ht 62.0 in | Wt 163.2 lb

## 2017-09-28 DIAGNOSIS — N644 Mastodynia: Secondary | ICD-10-CM

## 2017-09-28 DIAGNOSIS — Z1231 Encounter for screening mammogram for malignant neoplasm of breast: Secondary | ICD-10-CM

## 2017-09-28 DIAGNOSIS — Z01419 Encounter for gynecological examination (general) (routine) without abnormal findings: Secondary | ICD-10-CM

## 2017-09-28 HISTORY — DX: Hyperlipidemia, unspecified: E78.5

## 2017-09-28 NOTE — Addendum Note (Signed)
Encounter addended by: Loletta Parish, RN on: 09/28/2017 1:53 PM  Actions taken: Sign clinical note

## 2017-09-28 NOTE — Patient Instructions (Signed)
Explained breast self awareness with [name]. Let patient know [facility] will cover Pap smears and HPV typing every 5 years unless has a history of abnormal Pap smears. Referred patient to the [facility] for a diagnostic mammogram and possible left breast ultrasound. Appointment scheduled for Thursday, September 28, 2017 at 1320. Patient aware of appointment and will be there. Let patient know will follow up with her within the next couple of weeks with results of Pap smear by letter or phone. [name] verbalized understanding.  [place], Arvil Chaco, RN 1:21 PM

## 2017-09-28 NOTE — Progress Notes (Addendum)
Complaints of left outer breast pain x one month that is constant. Patient rates the pain at a 4 out of 10.  Pap Smear: Pap smear completed today. Last Pap smear was 04/06/2014 at [facility] and normal. Per patient has no history of an abnormal Pap smear. Last Pap smear result is in Epic.  Physical exam: Breasts Breasts symmetrical. No skin abnormalities bilateral breasts. No nipple retraction bilateral breasts. No nipple discharge bilateral breasts. No lymphadenopathy. No lumps palpated bilateral breasts. Complaints of left breast pain at 1 o'clock next to areola and 10 o'clock 9 cm from the nipple. Referred patient to the [facility] for a diagnostic mammogram and possible left breast ultrasound. Appointment scheduled for Thursday, September 28, 2017 at 1320.  Pelvic/Bimanual   Ext Genitalia No lesions, no swelling and no discharge observed on external genitalia.         Vagina Vagina pink and normal texture. No lesions or discharge observed in vagina.          Cervix Cervix is present. Cervix pink and of normal texture. No discharge observed.     Uterus Uterus is present and palpable. Uterus in normal position and normal size.        Adnexae Bilateral ovaries present and palpable. No tenderness on palpation.          Rectovaginal No rectal exam completed today since patient had no rectal complaints. No skin abnormalities observed on exam.    Smoking History: Patient is a former smoker that quit 8 years ago per patient.  Patient Navigation: Patient education provided. Access to services provided for patient through [facility] program. Spanish interpreter provided.  Colorectal Cancer Screening: Per patient had a colonoscopy completed 2 years ago. No complaints today. FIT Test given to patient to complete and return to [facility].  Used Spanish interpreter [employer] from [place].

## 2017-09-29 ENCOUNTER — Encounter (HOSPITAL_COMMUNITY): Payer: Self-pay | Admitting: *Deleted

## 2017-09-29 LAB — CYTOLOGY - PAP
Diagnosis: NEGATIVE
HPV: NOT DETECTED

## 2017-10-03 ENCOUNTER — Telehealth: Payer: Self-pay

## 2017-10-03 NOTE — Telephone Encounter (Signed)
CMA call regarding cpap results   Patient did not answer but left a message with her son in law to call back to the office regarding results

## 2017-10-04 ENCOUNTER — Other Ambulatory Visit: Payer: Self-pay | Admitting: Obstetrics and Gynecology

## 2017-10-04 ENCOUNTER — Ambulatory Visit (HOSPITAL_COMMUNITY)
Admission: EM | Admit: 2017-10-04 | Discharge: 2017-10-04 | Disposition: A | Discharge status: Home / Self Care | Payer: No Typology Code available for payment source | Attending: Family Medicine | Admitting: Family Medicine

## 2017-10-04 ENCOUNTER — Encounter (HOSPITAL_COMMUNITY): Payer: Self-pay | Admitting: Emergency Medicine

## 2017-10-04 DIAGNOSIS — K5900 Constipation, unspecified: Secondary | ICD-10-CM

## 2017-10-04 DIAGNOSIS — K649 Unspecified hemorrhoids: Secondary | ICD-10-CM

## 2017-10-04 DIAGNOSIS — R1084 Generalized abdominal pain: Secondary | ICD-10-CM

## 2017-10-04 MED ORDER — HYDROCORTISONE 2.5 % RE CREA: TOPICAL_CREAM | RECTAL | 0 refills | Status: DC

## 2017-10-04 NOTE — Discharge Instructions (Signed)
If develop increased pain, fevers, diarrhea, blood in stool, vomiting please return to be seen. If symptoms persist please follow up with PCP.

## 2017-10-04 NOTE — ED Triage Notes (Signed)
Pt here for constipation over last several days with some lower abd pain

## 2017-10-04 NOTE — ED Provider Notes (Signed)
[facility]    CSN: [id number] Arrival date & time: 10/04/17  1925     History   Chief Complaint Chief Complaint  Patient presents with  . Constipation    HPI Susan Lopez is a 56 y.o. female.   Susan Lopez presents with family with concerns of unusual stool as well as abdominal pain. She states she has a history of ovarian cysts, and pain feels similar. Without any new urinary symptoms. She has a history of hemorrhoids, was started on stool softener, fiber and probiotics. She has had two stools today which have been normal. Yesterday she noticed that there was what appeared to be skin like tissue in her stool which was alarming to her. She has occasionally noted small amounts of blood to her stool, without any black stools. She has burning sensation to rectum after having bowel movements. Without nausea vomiting or diarrhea. Without fevers. She collected a stool sample to be sent for a doctor, last colonoscopy was 2 years ago without cancer findings. Stool softeners and fiber have been helping with her constipation.    ROS per HPI.       Past Medical History:  Diagnosis Date  . Anxiety   . Arthritis   . Bone lesion 10/04/2013  . Calculus of gallbladder with acute and chronic cholecystitis without obstruction, s/p lap chole 27Dec2012 09/19/2011  . Complication of anesthesia   . Depression   . Gallstones   . Hemorrhoids, external 10-21-11   occ. bothersome  . Hyperlipidemia   . Hypertension   . Iron deficiency anemia   . Mammogram abnormal 10/04/2013  . PONV (postoperative nausea and vomiting)     Patient Active Problem List   Diagnosis Date Noted  . Snoring 07/05/2017  . OSA (obstructive sleep apnea) 07/05/2017  . 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 Left eye injury 11/23/2016  . Urinary incontinence in female 05/19/2016  . Bimalleolar fracture of left ankle 11/13/2015  . S/P ORIF (open reduction internal fixation) fracture 11/13/2015  .  Healthcare maintenance 10/08/2015  . Postmenopausal bleeding 10/08/2015  . Pain in joint, ankle and foot 10/08/2015  . Headache disorder 10/08/2015  . Atypical chest pain 10/08/2015  . Essential hypertension 10/08/2015  . Neck pain on right side 08/12/2015  . Grief reaction 03/09/2015  . Chronic thoracic spine pain 12/11/2014  . Depression (emotion) 08/12/2014  . Dyslipidemia 08/12/2014  . Headache(784.0) 07/17/2014  . UTI (urinary tract infection) 04/16/2014  . Cystocele with rectocele 04/16/2014  . Hyperlipidemia LDL goal < 100 01/22/2014  . Breast lump on left side at 12 o'clock position 10/22/2013  . Chronic constipation 10/09/2013  . Bone lesion 10/04/2013  . Mammogram abnormal 10/04/2013  . Abdominal pain, unspecified site 10/04/2013  . Unspecified essential hypertension 06/10/2013  . Chest pain, unspecified 06/10/2013  . Chronic throat pain 06/10/2013  . Back pain 11/14/2011  . Obesity (BMI 30-39.9) 11/14/2011  . GERD (gastroesophageal reflux disease) 09/19/2011  . Calculus of gallbladder with acute and chronic cholecystitis without obstruction, s/p lap chole 27Dec2012 09/19/2011  . Hemorrhoids, internal, with bleeding 09/19/2011  . ANEMIA-IRON DEFICIENCY 03/19/2009  . CHEST PAIN, ATYPICAL 03/19/2009    Past Surgical History:  Procedure Laterality Date  . [place]  . CHOLECYSTECTOMY  10/27/2011   Procedure: LAPAROSCOPIC CHOLECYSTECTOMY WITH INTRAOPERATIVE CHOLANGIOGRAM;  Surgeon: Adin Hector, MD;  Location: [facility];  Service: General;  Laterality: N/A;  Laparoscopic Chole w/ IOC Single Site  . EYE SURGERY  1`12-21-10  bil. for tissue growth-laser surgery  . HARDWARE REMOVAL Left 03/16/2016   Procedure: LEFT ANKLE HARDWARE REMOVAL;  Surgeon: Leandrew Koyanagi, MD;  Location: [facility];  Service: Orthopedics;  Laterality: Left;  . ORIF ANKLE FRACTURE Left 11/13/2015   Procedure: OPEN REDUCTION INTERNAL FIXATION (ORIF) LEFT BIMALLEOLAR  ANKLE FRACTURE;  Surgeon: Leandrew Koyanagi, MD;  Location: [facility];  Service: Orthopedics;  Laterality: Left;    OB History    Gravida Para Term Preterm AB Living   4 3 3  0 1 2   SAB TAB Ectopic Multiple Live Births   1 0 0 1         Home Medications    Prior to Admission medications   Medication Sig Start Date End Date Taking? Authorizing Provider  aspirin EC 325 MG tablet Take 1 tablet (325 mg total) by mouth 2 (two) times daily. Patient not taking: Reported on 10/07/2016 11/13/15   Leandrew Koyanagi, MD  atorvastatin (LIPITOR) 20 MG tablet Take 1 tablet (20 mg total) by mouth daily. 07/05/17   Tresa Garter, MD  gabapentin (NEURONTIN) 100 MG capsule Take 1 capsule (100 mg total) by mouth 2 (two) times daily. 07/05/17   Tresa Garter, MD  hydrocortisone (ANUSOL-HC) 2.5 % rectal cream Apply rectally 2 times daily 10/04/17   Augusto Gamble B, NP  ketorolac (ACULAR) 0.4 % SOLN Place 1 drop into the left eye 4 (four) times daily. Patient not taking: Reported on 09/28/2017 11/23/16   Tresa Garter, MD  lisinopril (PRINIVIL,ZESTRIL) 10 MG tablet Take 1 tablet (10 mg total) by mouth daily. 07/05/17   Tresa Garter, MD  meloxicam (MOBIC) 7.5 MG tablet Take 1 tablet (7.5 mg total) by mouth daily. 07/05/17   Tresa Garter, MD  Multiple Vitamin (MULTIVITAMIN WITH MINERALS) TABS tablet Take 1 tablet by mouth daily.    [provider]  omeprazole (PRILOSEC) 40 MG capsule Take 1 capsule (40 mg total) by mouth daily. 07/05/17   Tresa Garter, MD  Polyvinyl Alcohol-Povidone (REFRESH OP) Place 1 drop into both eyes 2 (two) times daily as needed.     [provider]  senna-docusate (SENOKOT S) 8.6-50 MG tablet Take 1 tablet by mouth at bedtime as needed. Patient not taking: Reported on 07/05/2017 11/13/15   Leandrew Koyanagi, MD    Family History Family History  Problem Relation Age of Onset  . Hypertension Mother   . Cancer Mother   . Hypertension Father   . Colon  cancer Neg Hx     Social History Social History   Tobacco Use  . Smoking status: Former Smoker    Packs/day: 0.25    Years: 10.00    Pack years: 2.50    Last attempt to quit: 10/20/2004    Years since quitting: 12.9  . Smokeless tobacco: Never Used  Substance Use Topics  . Alcohol use: No  . Drug use: No     Allergies   Hydrocodone; Tramadol; and Oxycodone   Review of Systems Review of Systems   Physical Exam Triage Vital Signs ED Triage Vitals [10/04/17 1956]  Enc Vitals Group     BP 124/82     Pulse Rate 70     Resp 18     Temp 98.5 F (36.9 C)     Temp Source Oral     SpO2 95 %     Weight      Height      Head  Circumference      Peak Flow      Pain Score      Pain Loc      Pain Edu?      Excl. in [place]?    No data found.  Updated Vital Signs BP 124/82 (BP Location: Right Arm)   Pulse 70   Temp 98.5 F (36.9 C) (Oral)   Resp 18   LMP 10/08/2015   SpO2 95%   Visual Acuity Right Eye Distance:   Left Eye Distance:   Bilateral Distance:    Right Eye Near:   Left Eye Near:    Bilateral Near:     Physical Exam  Constitutional: She is oriented to person, place, and time. She appears well-developed and well-nourished. No distress.  Cardiovascular: Normal rate, regular rhythm and normal heart sounds.  Pulmonary/Chest: Effort normal and breath sounds normal.  Abdominal: Soft. She exhibits no distension. There is tenderness in the periumbilical area, left upper quadrant and left lower quadrant. There is no rigidity, no rebound, no guarding, no CVA tenderness, no tenderness at McBurney's point and negative Murphy's sign.  Generalized left sided abdominal pain with palpation  Neurological: She is alert and oriented to person, place, and time.  Skin: Skin is warm and dry.     UC Treatments / Results  Labs (all labs ordered are listed, but only abnormal results are displayed) Labs Reviewed - No data to display  EKG  EKG Interpretation None        Radiology No results found.  Procedures Procedures (including critical care time)  Medications Ordered in UC Medications - No data to display   Initial Impression / Assessment and Plan / UC Course  I have reviewed the triage vital signs and the nursing notes.  Pertinent labs & imaging results that were available during my care of the patient were reviewed by me and considered in my medical decision making (see chart for details).     Patient non toxic non distress, without tachycardia tachypnea or fever tonight. Non specific findings on exam at this time. Continue with regimen to keep stools soft, may apply anusol to hemorrhoids as needed. Continue to monitor symptoms. Return precautions provided. Follow with PCP for stool sample which was sent. If symptoms worsen or do not improve in the next week to return to be seen or to follow up with PCP.  Patient verbalized understanding and agreeable to plan.    Final Clinical Impressions(s) / UC Diagnoses   Final diagnoses:  Generalized abdominal pain  Hemorrhoids, unspecified hemorrhoid type    ED Discharge Orders        Ordered    hydrocortisone (ANUSOL-HC) 2.5 % rectal cream     10/04/17 2015       Controlled Substance Prescriptions  Controlled Substance Registry consulted? Not Applicable   Zigmund Gottron, NP 10/04/17 2034

## 2017-10-05 MED FILL — PROCTOZONE-HC 2.5 % CREA: 2.5 | 30 days supply | Qty: 30 | Fill #0

## 2017-10-06 MED FILL — LISINOPRIL 10 MG TABS: 10 | 30 days supply | Qty: 30 | Fill #7

## 2017-10-06 MED FILL — OMEPRAZOLE DR 40 MG CAPSULE: 40 | 30 days supply | Qty: 30 | Fill #1

## 2017-10-06 MED FILL — ?ATORVASTATIN 20 MG TABLET: 20 | 30 days supply | Qty: 30 | Fill #2

## 2017-10-11 ENCOUNTER — Ambulatory Visit: Payer: Self-pay | Admitting: Internal Medicine

## 2017-10-11 ENCOUNTER — Encounter (HOSPITAL_COMMUNITY): Payer: Self-pay | Admitting: *Deleted

## 2017-10-11 NOTE — Progress Notes (Signed)
Letter mailed to patient with negative pap smear results. HPV was negative. Next pap smear due in five years. 

## 2017-10-28 ENCOUNTER — Ambulatory Visit (HOSPITAL_BASED_OUTPATIENT_CLINIC_OR_DEPARTMENT_OTHER): Payer: Self-pay | Attending: Internal Medicine | Admitting: Internal Medicine

## 2017-10-28 VITALS — Ht 62.0 in | Wt 169.0 lb

## 2017-10-28 DIAGNOSIS — Z79899 Other long term (current) drug therapy: Secondary | ICD-10-CM | POA: Insufficient documentation

## 2017-10-28 DIAGNOSIS — G4733 Obstructive sleep apnea (adult) (pediatric): Secondary | ICD-10-CM | POA: Insufficient documentation

## 2017-10-28 DIAGNOSIS — R0683 Snoring: Secondary | ICD-10-CM | POA: Insufficient documentation

## 2017-10-31 DIAGNOSIS — G4733 Obstructive sleep apnea (adult) (pediatric): Secondary | ICD-10-CM

## 2017-11-02 NOTE — Procedures (Signed)
    Patient Name: Susan Lopez, Susan Lopez Date: 10/28/2017 Gender: Female D.O.B: August 10, 1961 Age (years): 56 Referring Provider: Angelica Chessman Height (inches): 62 Interpreting Physician: Baird Lyons MD, ABSM Weight (lbs): 168 RPSGT: [place], [place] BMI: 31 MRN: [id number] Neck Size: 14.50 <br> <br> CLINICAL INFORMATION The patient is referred for a CPAP titration to treat sleep apnea.  Date of NPSG, Split Night or HST: NPSG 08/29/17  AHI 32.5/ hr, desaturation to 80%, body weight 168 lbs  SLEEP STUDY TECHNIQUE As per the AASM Manual for the Scoring of Sleep and Associated Events v2.3 (April 2016) with a hypopnea requiring 4% desaturations.  The channels recorded and monitored were frontal, central and occipital EEG, electrooculogram (EOG), submentalis EMG (chin), nasal and oral airflow, thoracic and abdominal wall motion, anterior tibialis EMG, snore microphone, electrocardiogram, and pulse oximetry. Continuous positive airway pressure (CPAP) was initiated at the beginning of the study and titrated to treat sleep-disordered breathing.  MEDICATIONS Medications self-administered by patient taken the night of the study : LISINOPRIL  TECHNICIAN COMMENTS Comments added by technician: This patient speaks Spanish, her brother came to help interpret for her. Comments added by scorer: N/A  RESPIRATORY PARAMETERS Optimal PAP Pressure (cm): 12 AHI at Optimal Pressure (/hr): 3.1 Overall Minimal O2 (%): 87.00 Supine % at Optimal Pressure (%): 98 Minimal O2 at Optimal Pressure (%): 94.0   SLEEP ARCHITECTURE The study was initiated at 10:52:50 PM and ended at 5:07:26 AM.  Sleep onset time was 0.0 minutes and the sleep efficiency was 92.9%. The total sleep time was 347.9 minutes.  The patient spent 4.74% of the night in stage N1 sleep, 57.31% in stage N2 sleep, 20.27% in stage N3 and 17.68% in REM.Stage REM latency was 68.9 minutes  Wake after sleep onset was 26.7. Alpha  intrusion was absent. Supine sleep was 99.30%.  CARDIAC DATA The 2 lead EKG demonstrated sinus rhythm. The mean heart rate was 56.95 beats per minute. Other EKG findings include: None.  LEG MOVEMENT DATA The total Periodic Limb Movements of Sleep (PLMS) were 0. The PLMS index was 0.00. A PLMS index of <15 is considered normal in adults.  IMPRESSIONS - The optimal PAP pressure was 12 cm of water. - Central sleep apnea was not noted in  this titration range (CAI = 1.4/h). Central apneas were induced at higher CPAP pressures. - Mild oxygen desaturations were observed during this titration (min O2 = 87.00%). - The patient snored with moderate snoring volume during this titration study. - No cardiac abnormalities were observed during this study. - Clinically significant periodic limb movements were not noted during this study. Arousals associated with PLMs were rare.  DIAGNOSIS - Obstructive Sleep Apnea (327.23 [G47.33 ICD-10])  RECOMMENDATIONS - Trial of CPAP therapy on 12 cm H2O with a Medium size Philips Respironics Nasal Pillow Mask Nuance Pro Gel mask and heated humidification. - Sleep hygiene should be reviewed to assess factors that may improve sleep quality. - Weight management and regular exercise should be initiated or continued.  [Electronically signed] 10/31/2017 10:07 AM  Baird Lyons MD, ABSM Diplomate, American Board of Sleep Medicine   NPI: [id number]                         [place], [place] [facility]  ELECTRONICALLY SIGNED ON:  11/02/2017, 1:03 PM [place] PH: [phone]   FX: [phone] [place]

## 2017-11-08 MED FILL — LISINOPRIL 10 MG TABS: 10 | 30 days supply | Qty: 30 | Fill #8

## 2017-11-08 MED FILL — OMEPRAZOLE DR 40 MG CAPSULE: 40 | 30 days supply | Qty: 30 | Fill #2

## 2017-11-08 MED FILL — GABAPENTIN 100 MG CAPSULE: 100 | 30 days supply | Qty: 60 | Fill #1

## 2017-11-08 MED FILL — ?ATORVASTATIN 20 MG TABLET: 20 | 30 days supply | Qty: 30 | Fill #3

## 2017-11-09 LAB — FECAL OCCULT BLOOD, IMMUNOCHEMICAL

## 2017-11-13 ENCOUNTER — Telehealth (HOSPITAL_COMMUNITY): Payer: Self-pay

## 2017-11-13 NOTE — Telephone Encounter (Signed)
Interpreter Hanley Seamen contacted patient to let her know that her FIT Test results came back and the specimen was too old for testing. She let her know if she wants to complete another test to call us and let us know and we will mail her a new one to complete.

## 2017-11-20 ENCOUNTER — Telehealth: Payer: Self-pay | Admitting: *Deleted

## 2017-11-20 NOTE — Telephone Encounter (Signed)
Medical Assistant used [employer] to contact patient.  Interpreter Name: Colbert Coyer #: [id number] Patient was not available, [employer] left patient a voicemail. !!!Please inform patient Obstructive sleep apnea being noted and needing to have a CPAP machine!!! Machine will be ordered and patient will be contacted for pick up and instructions!!!

## 2017-11-20 NOTE — Telephone Encounter (Signed)
-----   Message from Tresa Garter, MD sent at 11/10/2017 12:33 PM EST ----- Sleep study confirmed Obstructive Sleep Apnea, needs CPAP. Ordered

## 2017-12-13 ENCOUNTER — Ambulatory Visit: Payer: Self-pay | Attending: Internal Medicine | Admitting: Internal Medicine

## 2017-12-13 ENCOUNTER — Telehealth: Payer: Self-pay

## 2017-12-13 ENCOUNTER — Other Ambulatory Visit: Payer: Self-pay

## 2017-12-13 ENCOUNTER — Encounter: Payer: Self-pay | Admitting: Internal Medicine

## 2017-12-13 ENCOUNTER — Other Ambulatory Visit: Payer: Self-pay | Admitting: Internal Medicine

## 2017-12-13 VITALS — BP 114/74 | HR 69 | Temp 98.3°F | Resp 16 | Wt 159.0 lb

## 2017-12-13 DIAGNOSIS — I10 Essential (primary) hypertension: Secondary | ICD-10-CM

## 2017-12-13 DIAGNOSIS — F329 Major depressive disorder, single episode, unspecified: Secondary | ICD-10-CM | POA: Insufficient documentation

## 2017-12-13 DIAGNOSIS — M1990 Unspecified osteoarthritis, unspecified site: Secondary | ICD-10-CM | POA: Insufficient documentation

## 2017-12-13 DIAGNOSIS — G8929 Other chronic pain: Secondary | ICD-10-CM

## 2017-12-13 DIAGNOSIS — R42 Dizziness and giddiness: Secondary | ICD-10-CM

## 2017-12-13 DIAGNOSIS — K5900 Constipation, unspecified: Secondary | ICD-10-CM

## 2017-12-13 DIAGNOSIS — Z791 Long term (current) use of non-steroidal anti-inflammatories (NSAID): Secondary | ICD-10-CM | POA: Insufficient documentation

## 2017-12-13 DIAGNOSIS — Z888 Allergy status to other drugs, medicaments and biological substances status: Secondary | ICD-10-CM | POA: Insufficient documentation

## 2017-12-13 DIAGNOSIS — K219 Gastro-esophageal reflux disease without esophagitis: Secondary | ICD-10-CM

## 2017-12-13 DIAGNOSIS — Z79899 Other long term (current) drug therapy: Secondary | ICD-10-CM | POA: Insufficient documentation

## 2017-12-13 DIAGNOSIS — Z885 Allergy status to narcotic agent status: Secondary | ICD-10-CM | POA: Insufficient documentation

## 2017-12-13 DIAGNOSIS — Z9049 Acquired absence of other specified parts of digestive tract: Secondary | ICD-10-CM | POA: Insufficient documentation

## 2017-12-13 DIAGNOSIS — D509 Iron deficiency anemia, unspecified: Secondary | ICD-10-CM | POA: Insufficient documentation

## 2017-12-13 DIAGNOSIS — Z7982 Long term (current) use of aspirin: Secondary | ICD-10-CM | POA: Insufficient documentation

## 2017-12-13 DIAGNOSIS — K644 Residual hemorrhoidal skin tags: Secondary | ICD-10-CM | POA: Insufficient documentation

## 2017-12-13 DIAGNOSIS — M25579 Pain in unspecified ankle and joints of unspecified foot: Secondary | ICD-10-CM

## 2017-12-13 DIAGNOSIS — F419 Anxiety disorder, unspecified: Secondary | ICD-10-CM | POA: Insufficient documentation

## 2017-12-13 DIAGNOSIS — M546 Pain in thoracic spine: Secondary | ICD-10-CM

## 2017-12-13 DIAGNOSIS — M5124 Other intervertebral disc displacement, thoracic region: Secondary | ICD-10-CM | POA: Insufficient documentation

## 2017-12-13 DIAGNOSIS — G4733 Obstructive sleep apnea (adult) (pediatric): Secondary | ICD-10-CM

## 2017-12-13 DIAGNOSIS — E785 Hyperlipidemia, unspecified: Secondary | ICD-10-CM

## 2017-12-13 MED ORDER — MECLIZINE HCL 25 MG PO TABS: 25.0000 mg | ORAL_TABLET | Freq: Three times a day (TID) | ORAL | 0 refills | Status: DC | PRN

## 2017-12-13 MED ORDER — GABAPENTIN 100 MG PO CAPS: 100.0000 mg | ORAL_CAPSULE | Freq: Two times a day (BID) | ORAL | 3 refills | Status: DC

## 2017-12-13 MED ORDER — MELOXICAM 7.5 MG PO TABS: 7.5000 mg | ORAL_TABLET | Freq: Every day | ORAL | 3 refills | Status: DC

## 2017-12-13 MED ORDER — LISINOPRIL 10 MG PO TABS: 10.0000 mg | ORAL_TABLET | Freq: Every day | ORAL | 3 refills | Status: DC

## 2017-12-13 MED ORDER — OMEPRAZOLE 40 MG PO CPDR: 40.0000 mg | DELAYED_RELEASE_CAPSULE | Freq: Every day | ORAL | 3 refills | Status: DC

## 2017-12-13 MED ORDER — ATORVASTATIN CALCIUM 20 MG PO TABS: 20.0000 mg | ORAL_TABLET | Freq: Every day | ORAL | 3 refills | Status: DC

## 2017-12-13 MED FILL — LISINOPRIL 10 MG TABS: 10 | 30 days supply | Qty: 30 | Fill #0

## 2017-12-13 MED FILL — OMEPRAZOLE DR 40 MG CAPSULE: 40 | 30 days supply | Qty: 30 | Fill #3

## 2017-12-13 MED FILL — ?MECLIZINE 25MG TAB: 25 | 20 days supply | Qty: 60 | Fill #0

## 2017-12-13 MED FILL — ?ATORVASTATIN 20MG TABL: 20 | 30 days supply | Qty: 30 | Fill #4

## 2017-12-13 MED FILL — MELOXICAM 7.5 MG TABLET: 7.5 | 30 days supply | Qty: 30 | Fill #0

## 2017-12-13 MED FILL — GABAPENTIN 100 MG CAPSULE: 100 | 30 days supply | Qty: 60 | Fill #0

## 2017-12-13 NOTE — Progress Notes (Signed)
Lab test and discuss results Dizzy spells

## 2017-12-13 NOTE — Telephone Encounter (Signed)
Met with the patient at the request of Dr Doreene Burke to discuss obtaining a CPAP machine as she has no insurance.  Jonnie Kind, RN - NP student was present as a Administrator, sports. This CM explained the CPAP assistance program through the American Sleep Apnea Association. The patient is eligible for a refurbished CPAP machine. The [facility] will pay the $100 donation for the machine as she has no income. The machine may take about 1 month to arrive at the clinic and then she will be scheduled to meet with a [facility] Respiratory Therapist to receive instruction regarding the use and maintenance of the machine  She will be contacted when the machine arrives and teaching is scheduled.  She was very appreciative of the assistance.

## 2017-12-13 NOTE — Patient Instructions (Addendum)
Plan de alimentacin DASH DASH Eating Plan DASH es la sigla en ingls de "Enfoques Alimentarios para Detener la Hipertensin" (Dietary Approaches to Stop Hypertension). El plan de alimentacin DASH ha demostrado bajar la presin arterial elevada (hipertensin). Tambin puede reducir [employer] de diabetes tipo 2, enfermedad cardaca y accidente cerebrovascular. Este plan tambin puede ayudar a Horticulturist, commercial. Consejos para seguir este plan [place]autas generales  Evite ingerir ms de 2,300 mg (miligramos) de sal (sodio) por da. Si tiene hipertensin, es posible que necesite reducir la ingesta de sodio a 1,500 mg por da.  Limite el consumo de alcohol a no ms de 53mdida por da si es mujer y no est [place] y 259midas por da si es hombre. Una medida equivale a 12oz (35552mde cerveza, 5oz (148m27me vino o 1oz (44ml43m bebidas alcohlicas de alta graduacin.  Trabaje con su mdico para mantener un peso saludable o perder peso.[place]ty Mediagntele cul es el peso recomendado para usted.  Realice al menos 30 minutos de ejercicio que haga que se acelere su corazn (ejercicio aerbiArboriculturistmayorHartford Financiala semanBayside Gardensas actividades pueden incluir caminar, nadar o andar en bicicleta.  Trabaje con su mdico o especialista en alimentacin y nutricin (nutricionista) para ajustar su plan alimentario a sus necesidades calricas personales. Lectura de las etiquetas de los alimentos  Verifique en las etiquetas de los alimentos, la cantidad de sodio por porcin. Elija alimentos con menos del 5 por ciento del valor diario de sodio. Generalmente, los alimentos con menos de 300 mg de sodio por porcin se encuadran dentro de este plan alimentario.  Para encontrar cereales integrales, busque la palabra "integral" como primera palabra en la lista de ingredientes. De compras  Compre productos en los que en su etiqueta diga: "bajo contenido de sodio" o "sin agregado de sal".  Compre alimentos frescos. Evite  los alimentos enlatados y comidas precocidas o congeladas. Coccin  Evite agregar sal cuando cocine. Use hierbas o aderezos sin sal, en lugar de sal de mesa o sal marina. Consulte al mdico o farmacutico antes de usar sustitutos de la sal.  No fra los alimentos. A la hora de cocinar los alimentos opte por hornearlos, hervirlos, grillarlos y asarlos a la paAdministrator, artscine con aceites cardiosaludables, como oliva, canola, soja o girasol. Planificacin de las comidas   Consuma una dieta equilibrada, que incluya lo siguiente: ? 5o ms porciones de frutas y verduSet designerte de que la mitad del plato de cada comida sean frutas y verduras. ? Hasta 6 u 8 porciones de cereales integrales por da. ? Menos de 6 onzas de carne, aves o pescado magroGames developer porcin de 3 onzas de carne tiene casi el mismo tamao que un mazo de cartas. Un huevo equivale a 1 onza. ? Dos porciones de productos lcteos descremados por da. ?Training and development officerna porcin de frutos secos, semillas o frijoles 5 veces por semana. ? Grasas cardiosaludables. Las grasas saludables llamadas cidos grasos omega-3 se encuentran en alimentos como semillas de lino y pescados de agua fra, como por ejemplo, sardinas, salmn y caballa.  Limite la cantidad que ingiere de los siguientes alimentos: ? Alimentos enlatados o envasados. ? Alimentos con alto contenido de grasa trans, como alimentos fritos. ? Alimentos con alto contenido de grasa saturada, como carne con grasa. ? Dulces, postres, bebidas azucaradas y otros alimentos con azcar agregada. ? Productos lcteos enteros.  No le agregue sal a los alimentos antes de probarlos.  Trate de comer al  menos 2 comidas vegetarianas por semana.  Consuma ms comida casera y menos de restaurante, de bufs y comida rpida.  Cuando coma en un restaurante, pida que preparen su comida con menos sal o, en lo posible, sin nada de sal. Qu alimentos se recomiendan? Los alimentos enumerados a  continuacin no constituyen Furniture conservator/restorer. Hable con el nutricionista sobre las mejores opciones alimenticias para usted. Cereales Pan de salvado o integral. Pasta de salvado o integral. Arroz integral. Avena. Quinua. Trigo burgol. Cereales integrales y con bajo contenido de sodio. Pan pita. Galletitas de [place] con bajo contenido de [place] y [place]. Tortillas de [place] integral. Verduras Verduras frescas o congeladas (crudas, al vapor, asadas o grilladas). Jugos de tomate y verduras con bajo contenido de sodio o reducidos en sodio. Salsa y pasta de tomate con bajo contenido de sodio o reducidas en sodio. Verduras enlatadas con bajo contenido de sodio o reducidas en sodio. Frutas Todas las frutas frescas, congeladas o disecadas. Frutas enlatadas en jugo natural (sin agregado de azcar). Carne y otros alimentos proteicos Pollo o pavo sin piel. Carne de pollo o de [place]. Cerdo desgrasado. Pescado y [employer]. Claras de huevo. Porotos, guisantes o lentejas secos. Frutos secos, mantequilla de frutos secos y semillas sin sal. Frijoles enlatados sin sal. Cortes de carne vacuna magra, desgrasada. Embutidos magros, con bajo contenido de [place]. Lcteos Leche descremada (1%) o descremada. Quesos sin grasa, con bajo contenido de grasa o descremados. Queso blanco o ricota sin grasa, con bajo contenido de [place]. Yogur semidescremado o descremado. Queso con bajo contenido de [place] y [place]. Grasas y [employer] untables que no contengan grasas trans. Aceite vegetal. Lubertha Basque y aderezos para ensaladas livianos o con bajo contenido de grasas (reducidos en sodio). Aceite de canola, crtamo, oliva, soja y [place]. [place]. Condimentos y otros alimentos Hierbas. Especias. Mezclas de condimentos sin sal. Palomitas de maz y pretzels sin sal. Dulces con bajo contenido de grasas. Qu alimentos no se recomiendan? Los alimentos enumerados a continuacin no constituyen Furniture conservator/restorer. Hable con el  nutricionista sobre las mejores opciones alimenticias para usted. Cereales Productos de panificacin hechos con grasa, como medialunas, magdalenas y algunos panes. Comidas con arroz o pasta seca listas para usar. Verduras Verduras con crema o fritas. Verduras en [place]. Verduras enlatadas regulares (que no sean con bajo contenido de sodio o reducidas en sodio). Pasta y salsa de tomates enlatadas regulares (que no sean con bajo contenido de sodio o reducidas en sodio). Jugos de tomate y verduras regulares (que no sean con bajo contenido de sodio o reducidos en sodio). Pepinillos. Aceitunas. Lambert Mody Fruta enlatada en almbar liviano o espeso. Frutas cocidas en aceite. Frutas con salsa de crema o [place]. Carne y otros alimentos proteicos Cortes de carne con grasa. Costillas. Carne frita. [place]ocino. Salchichas. Mortadela y otras carnes procesadas. Salame. Panceta. Perros calientes (hotdogs). [place]. Frutos secos y semillas con sal. Frijoles enlatados con agregado de sal. Pescado enlatado o ahumado. Huevos enteros o yemas. Pollo o pavo con piel. Lcteos Leche entera o al 2%, crema y mitad leche y mitad crema. Queso crema entero o con toda su grasa. Yogur entero o endulzado. Quesos con toda su grasa. Sustitutos de cremas no lcteas. Coberturas batidas. Quesos para untar y quesos procesados. Grasas y [employer]. Margarina en barra. [place]. Materia grasa. Mantequilla clarificada. Grasa de panceta. Aceites tropicales como aceite de coco, palmiste o palma. Condimentos y otros alimentos Palomitas de maz y pretzels con sal. Sal de  cebolla, sal de ajo, sal condimentada, sal de mesa y sal marina. Salsa [place]. Salsa trtara. Salsa barbacoa. Salsa teriyaki. Salsa de soja, incluso la que tiene contenido reducido de [place]. Salsa de carne. Salsas en lata y envasadas. Salsa de pescado. Salsa de [place]. Salsa rosada. Rbano picante envasado. [place]. [place]aza. Saborizantes y  tiernizantes para carne. Caldo en cubitos. Salsa picante y salsa tabasco. Escabeches envasados o ya preparados. Aderezos para tacos prefabricados o envasados. Salsas. Aderezos comunes para ensalada. Dnde encontrar ms informacin:  [place], los Pulmones y Herbalist (National Heart, Lung, and [place]): https://wilson-eaton.com/  Asociacin Estadounidense del Corazn (American Heart Association): www.heart.org Resumen  El plan de alimentacin DASH ha demostrado bajar la presin arterial elevada (hipertensin). Tambin puede reducir UnitedHealth de diabetes tipo 2, enfermedad cardaca y accidente cerebrovascular.  Con el plan de alimentacin DASH, deber limitar el consumo de sal (sodio) a 2,300 mg por da. Si tiene hipertensin, es posible que necesite reducir la ingesta de sodio a 1,500 mg por da.  Cuando siga el plan de alimentacin DASH, trate de comer ms frutas frescas y verduras, cereales integrales, carnes magras, lcteos descremados y grasas cardiosaludables.  Trabaje con su mdico o especialista en alimentacin y nutricin (nutricionista) para ajustar su plan alimentario a sus necesidades calricas personales. Esta informacin no tiene Marine scientist el consejo del mdico. Asegrese de hacerle al mdico cualquier pregunta que tenga. Document Released: 10/06/2011 Document Revised: 02/06/2017 Document Reviewed: 02/06/2017 Elsevier Interactive Patient Education  2018 [place].  Apnea del sueo (Sleep Apnea) La apnea del sueo es un trastorno que afecta la respiracin. Las Engineer, manufacturing con apnea del sueo tienen momentos en los que hacen breves pausas al respirar o en los que respiran de forma superficial mientras duermen. La apnea del sueo puede causar estos sntomas:  Dificultad para quedarse dormido.  Sueo o cansancio Agricultural consultant.  Irritabilidad.  Ronquidos fuertes.  Dolores de cabeza matutinos.  Dificultad para concentrarse.  Olvido de  cosas.  Menos inters sexual.  Somnolencia sin motivo.  Cambios en el estado de nimo.  Cambios en la personalidad.  Depresin.  Muchos despertares nocturnos para orinar.  [employer].  Dolor de Investment banker, operational. CUIDADOS EN EL HOGAR  Haga los cambios en su rutina que le haya recomendado el mdico.  Consuma una dieta sana y Bryson Dames equilibrada.  Tome los medicamentos de venta libre y los recetados solamente como se lo haya indicado el mdico.  Evite el alcohol, los calmantes (sedantes) y los medicamentos opiceos.  Si tiene sobrepeso, tome medidas para bajar de [employer].  Si le proporcionaron un dispositivo para usar mientras duerme, selo solamente como se lo haya indicado el mdico.  No consuma ningn producto que contenga tabaco, lo que incluye cigarrillos, tabaco de Higher education careers adviser y Psychologist, sport and exercise. Si necesita ayuda para dejar de fumar, consulte al mdico.  Concurra a todas las visitas de control como se lo haya indicado el mdico. Esto es importante. SOLICITE AYUDA SI:  El dispositivo que recibi para usar mientras duerme es incmodo o parece no funcionar.  Los sntomas no mejoran.  Los sntomas empeoran. SOLICITE AYUDA DE INMEDIATO SI:  Le duele el pecho.  Tiene dificultad para inhalar suficiente aire (falta de aire).  Tiene molestias en la espalda, en los brazos o en el [place].  Presenta dificultad para hablar.  Siente debilidad en un lado del cuerpo.  Se la cae un lado de la cara. Estos sntomas pueden Sales executive. No espere hasta que los  sntomas desaparezcan. Solicite atencin mdica de inmediato. Comunquese con el servicio de emergencias de su localidad (911 en los [place]). No conduzca por sus propios medios Principal Financial. Esta informacin no tiene Marine scientist el consejo del mdico. Asegrese de hacerle al mdico cualquier pregunta que tenga. Document Released: 11/19/2010 Document Revised: 02/08/2016 Document Reviewed:  07/27/2015 Elsevier Interactive Patient Education  2018 [place] por reflujo gastroesofgico en los adultos (Gastroesophageal Reflux Disease, Adult) Normalmente, los alimentos descienden por el esfago y se depositan en el estmago para su digestin. Si una persona tiene enfermedad por reflujo gastroesofgico (ERGE), los alimentos y el cido estomacal regresan al esfago. Cuando esto ocurre, el esfago se irrita y se hincha (inflama). Con el tiempo, la ERGE puede provocar la formacin de pequeas perforaciones (lceras) en la mucosa del esfago. CUIDADOS EN EL HOGAR Dieta Siga la dieta como se lo haya indicado el mdico. Tal vez deba evitar los siguientes alimentos y bebidas: Caf y t (con o sin cafena). Bebidas que contengan alcohol. Bebidas energizantes y deportivas. Gaseosas o refrescos. Chocolate y cacao. Menta y [place]. Ajo y cebollas. Rbano picante. Alimentos muy condimentados y cidos, como pimientos, [place] en polvo, curry en polvo, vinagre, salsas picantes y Engineering geologist. Frutas ctricas y sus jugos, como naranjas, limones y limas. Alimentos a base de tomates, como salsa roja, [place], salsa y pizza con salsa roja. Alimentos fritos y Radio broadcast assistant, como rosquillas, papas fritas y aderezos con alto contenido de Lobbyist. Carnes con alto contenido de [place], como hot dogs, filetes de entrecot, salchicha, jamn y tocino. Productos lcteos con alto contenido de [place], como [place], [place] y queso crema. Consuma pequeas porciones de comida con ms frecuencia. Evite consumir porciones abundantes. Evite beber Flowella comidas. No coma durante las 2 o 3horas previas a la hora de [place]. No se acueste inmediatamente despus de comer. No haga actividad fsica enseguida despus de comer. Instrucciones generales Est atento a cualquier cambio en los sntomas. Tome los medicamentos de venta libre y los recetados solamente como se lo haya  indicado el mdico. No tome aspirina, ibuprofeno ni otros antiinflamatorios no esteroides (AINE), a menos que el mdico lo autorice. No consuma ningn producto que contenga tabaco, lo que incluye cigarrillos, tabaco de Higher education careers adviser y Psychologist, sport and exercise. Si necesita ayuda para dejar de fumar, consulte al mdico. Use ropa suelta. No use nada ajustado alrededor Parker Hann[employer]in. [place] (eleve) unas 6pulgadas (15centmetros) la cabecera de la cama. Intente bajar el nivel de estrs. Si necesita ayuda para hacerlo, consulte al [employer]Westvaco. Si tiene sobrepeso, Multimedia programmer un peso saludable. Pregntele a su mdico cmo puede perder peso de manera segura. Concurra a todas las visitas de control como se lo haya indicado el mdico. Esto es importante. SOLICITE AYUDA SI: Aparecen nuevos sntomas. [place] y no sabe por qu. Tiene dificultad para tragar o siente dolor al [employer]. Tiene sibilancias o tos que no desaparece. Los sntomas no mejoran con Dispensing optician. Tiene la voz ronca. SOLICITE AYUDA DE INMEDIATO SI: Tiene dolor en los brazos, el cuello, los [place], la dentadura o la espalda. Philbert Riser, se marea o tiene sensacin de desvanecimiento. Siente falta de aire o Tourist information centre manager. Vomita y el vmito es parecido a la sangre o a los granos de caf. Pierde el conocimiento (se desmaya). Las heces son sanguinolentas o de color negro. No puede tragar, beber o comer. Esta informacin no tiene Marine scientist el consejo del mdico.  Asegrese de hacerle al mdico cualquier pregunta que tenga. Document Released: 11/19/2010 Document Revised: 07/08/2015 Document Reviewed: 02/11/2015 Elsevier Interactive Patient Education  [employer].

## 2017-12-13 NOTE — Progress Notes (Signed)
Subjective:  Patient ID: Susan Lopez, female    DOB: 10-03-1961  Age: 57 y.o. MRN: [id number]  CC: Dizziness  HPI Susan Lopez is a 57 year old female with PMH of HTN, HLD, back pain and constipation who presents today for follow-up and c/o dizzines.  Reports dizziness that occurs when getting up or picking things up from the floor. Denies falls, reports having a hx of vertigo. Denies chest pains, bleeding, palpitation or SOB but admits to an ocassional missed beat. Has not checked BP at home but reports compliance with medications. She continues to have arthritis and back pain. MRI of thoracic spince in October 2018 showed protrusion of disc at T8-T9. Reports relief with meloxicam and gabapentin.  She continues to c/o constipation which she says is an on-going issue. She takes metamucil and probioitics at home. Reports producing a small white-translucent substance when attempting to have a small bowel movement in December. She was later seen by the ED and was discharged with Anusol for hemorrhoids. This has not occurred since. Denies abdominal pain, fevers, or rectal bleeding.   She did her sleep study 11/18 but has yet to receive her CPAP machine. She says they never followed-up with her regarding the machine but that she did receive 2 face masks.    Past Medical History:  Diagnosis Date  . Anxiety   . Arthritis   . Bone lesion 10/04/2013  . Calculus of gallbladder with acute and chronic cholecystitis without obstruction, s/p lap chole 27Dec2012 09/19/2011  . Complication of anesthesia   . Depression   . Gallstones   . Hemorrhoids, external 10-21-11   occ. bothersome  . Hyperlipidemia   . Hypertension   . Iron deficiency anemia   . Mammogram abnormal 10/04/2013  . PONV (postoperative nausea and vomiting)    Past Surgical History:  Procedure Laterality Date  . [place]  . CHOLECYSTECTOMY  10/27/2011   Procedure: LAPAROSCOPIC  CHOLECYSTECTOMY WITH INTRAOPERATIVE CHOLANGIOGRAM;  Surgeon: Adin Hector, MD;  Location: [facility];  Service: General;  Laterality: N/A;  Laparoscopic Chole w/ IOC Single Site  . EYE SURGERY  1`12-21-10   bil. for tissue growth-laser surgery  . HARDWARE REMOVAL Left 03/16/2016   Procedure: LEFT ANKLE HARDWARE REMOVAL;  Surgeon: Leandrew Koyanagi, MD;  Location: [facility];  Service: Orthopedics;  Laterality: Left;  . ORIF ANKLE FRACTURE Left 11/13/2015   Procedure: OPEN REDUCTION INTERNAL FIXATION (ORIF) LEFT BIMALLEOLAR ANKLE FRACTURE;  Surgeon: Leandrew Koyanagi, MD;  Location: [facility];  Service: Orthopedics;  Laterality: Left;   Allergies  Allergen Reactions  . Hydrocodone Nausea And Vomiting  . Tramadol Nausea Only  . Oxycodone Rash    Outpatient Medications Prior to Visit  Medication Sig Dispense Refill  . aspirin EC 325 MG tablet Take 1 tablet (325 mg total) by mouth 2 (two) times daily. (Patient not taking: Reported on 10/07/2016) 84 tablet 0  . hydrocortisone (ANUSOL-HC) 2.5 % rectal cream Apply rectally 2 times daily 28.35 g 0  . ketorolac (ACULAR) 0.4 % SOLN Place 1 drop into the left eye 4 (four) times daily. (Patient not taking: Reported on 09/28/2017) 1 Bottle 1  . Multiple Vitamin (MULTIVITAMIN WITH MINERALS) TABS tablet Take 1 tablet by mouth daily.    . Polyvinyl Alcohol-Povidone (REFRESH OP) Place 1 drop into both eyes 2 (two) times daily as needed.     . senna-docusate (SENOKOT S) 8.6-50 MG tablet Take 1 tablet by mouth at  bedtime as needed. (Patient not taking: Reported on 07/05/2017) 30 tablet 1  . atorvastatin (LIPITOR) 20 MG tablet Take 1 tablet (20 mg total) by mouth daily. 90 tablet 3  . gabapentin (NEURONTIN) 100 MG capsule Take 1 capsule (100 mg total) by mouth 2 (two) times daily. 180 capsule 3  . lisinopril (PRINIVIL,ZESTRIL) 10 MG tablet Take 1 tablet (10 mg total) by mouth daily. 90 tablet 3  . meloxicam (MOBIC) 7.5 MG tablet Take 1 tablet (7.5 mg total) by mouth  daily. 30 tablet 3  . omeprazole (PRILOSEC) 40 MG capsule Take 1 capsule (40 mg total) by mouth daily. 90 capsule 3   No facility-administered medications prior to visit.     ROS Review of Systems  Constitutional: Negative for fatigue and fever.  HENT: Negative.   Respiratory: Negative for cough and shortness of breath.   Cardiovascular: Negative for chest pain, palpitations and leg swelling.       Feels occasional missed heart beat  Gastrointestinal: Positive for constipation. Negative for abdominal distention, abdominal pain, anal bleeding, blood in stool, nausea and vomiting.  Musculoskeletal: Positive for arthralgias and back pain.  Neurological: Positive for dizziness. Negative for syncope, weakness, light-headedness and numbness.    Objective:  BP 114/74 (Patient Position: Standing)   Pulse 69   Temp 98.3 F (36.8 C) (Oral)   Resp 16   Wt 159 lb (72.1 kg)   LMP 10/08/2015   SpO2 97%   BMI 29.08 kg/m   BP/Weight 12/13/2017 10/28/2017 54/03/2702  Systolic BP 500 - 938  Diastolic BP 74 - 82  Wt. (Lbs) 159 169 -  BMI 29.08 30.91 -   EKG was normal.  Physical Exam  Constitutional: She is oriented to person, place, and time. She appears well-developed and well-nourished. No distress.  HENT:  Head: Normocephalic and atraumatic.  Neck: Normal range of motion. Neck supple.  Cardiovascular: Normal rate, regular rhythm, normal heart sounds and intact distal pulses.  Pulmonary/Chest: Effort normal and breath sounds normal. No respiratory distress.  Abdominal: Soft. Bowel sounds are normal. She exhibits no distension. There is no tenderness.  Musculoskeletal: Normal range of motion. She exhibits tenderness (of upper back. (thoracic spine region)). She exhibits no edema.  Neurological: She is alert and oriented to person, place, and time.  Skin: Skin is warm and dry.  Psychiatric: She has a normal mood and affect. Her behavior is normal.  Nursing note and vitals  reviewed.    Assessment & Plan:   1. Essential hypertension Controlled.  We have discussed target BP range and blood pressure goal. I have advised patient to check BP regularly and to call us back or report to clinic if the numbers are consistently higher than 140/90. We discussed the importance of compliance with medical therapy and DASH diet recommended, consequences of uncontrolled hypertension discussed.  - continue current BP medications - BMP - Hemoglobin A1c - lisinopril (PRINIVIL,ZESTRIL) 10 MG tablet; Take 1 tablet (10 mg total) by mouth daily.  Dispense: 90 tablet; Refill: 3  2. OSA (obstructive sleep apnea) Spoke with Opal Sidles case Freight forwarder regarding need for CPAP machine.   3. Dyslipidemia - Lipid Panel. Refill  - atorvastatin (LIPITOR) 20 MG tablet; Take 1 tablet (20 mg total) by mouth daily.  Dispense: 90 tablet; Refill: 3  4. 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 Will refer to sports medicine for pain management.  - meloxicam (MOBIC) 7.5 MG tablet; Take 1 tablet (7.5 mg total) by mouth daily.  Dispense: 30 tablet; Refill: 3 - Ambulatory referral to Sports Medicine  5. Pain in joint, ankle and foot, unspecified laterality - gabapentin (NEURONTIN) 100 MG capsule; Take 1 capsule (100 mg total) by mouth 2 (two) times daily.  Dispense: 180 capsule; Refill: 3  6. Dizziness Orthostatics negative, EKG normal. Antivert 25mg  TID PRN for dizziness.  - CBC with diff -TSH - EKG 12-Lead  7. Constipation, unspecified constipation type Due for colonoscopy this year.  - Ambulatory referral to Gastroenterology  8. Gastroesophageal reflux disease, esophagitis presence not specified  - omeprazole (PRILOSEC) 40 MG capsule; Take 1 capsule (40 mg total) by mouth daily.  Dispense: 90 capsule; Refill: 3  Meds ordered this encounter  Medications  . atorvastatin (LIPITOR) 20 MG tablet    Sig: Take 1 tablet (20 mg total) by mouth daily.    Dispense:  90 tablet     Refill:  3  . lisinopril (PRINIVIL,ZESTRIL) 10 MG tablet    Sig: Take 1 tablet (10 mg total) by mouth daily.    Dispense:  90 tablet    Refill:  3  . meloxicam (MOBIC) 7.5 MG tablet    Sig: Take 1 tablet (7.5 mg total) by mouth daily.    Dispense:  30 tablet    Refill:  3  . omeprazole (PRILOSEC) 40 MG capsule    Sig: Take 1 capsule (40 mg total) by mouth daily.    Dispense:  90 capsule    Refill:  3  . gabapentin (NEURONTIN) 100 MG capsule    Sig: Take 1 capsule (100 mg total) by mouth 2 (two) times daily.    Dispense:  180 capsule    Refill:  3  . meclizine (ANTIVERT) 25 MG tablet    Sig: Take 1 tablet (25 mg total) by mouth 3 (three) times daily as needed for dizziness.    Dispense:  60 tablet    Refill:  0    Follow-up: Return in about 3 months (around 03/12/2018) for Follow up HTN, Dyslipidemia.   Jonnie Kind DNP Student  Evaluation and management procedures were performed by me with DNP Student in attendance, note written by DNP student under my supervision and collaboration. I have reviewed the note and I agree with the management and plan.   Angelica Chessman, MD, [place], [place], Karilyn Cota [facility] and [facility] [place], [place]   12/14/2017, 2:22 PM

## 2017-12-14 ENCOUNTER — Telehealth: Payer: Self-pay | Admitting: Internal Medicine

## 2017-12-14 LAB — CBC WITH DIFFERENTIAL/PLATELET
Basophils Absolute: 0 10*3/uL (ref 0.0–0.2)
Basos: 1 %
EOS (ABSOLUTE): 0.1 10*3/uL (ref 0.0–0.4)
Eos: 2 %
HEMATOCRIT: 42.8 % (ref 34.0–46.6)
Hemoglobin: 14.4 g/dL (ref 11.1–15.9)
Immature Grans (Abs): 0 10*3/uL (ref 0.0–0.1)
Immature Granulocytes: 0 %
LYMPHS ABS: 2.2 10*3/uL (ref 0.7–3.1)
Lymphs: 34 %
MCH: 30.8 pg (ref 26.6–33.0)
MCHC: 33.6 g/dL (ref 31.5–35.7)
MCV: 92 fL (ref 79–97)
MONOS ABS: 0.4 10*3/uL (ref 0.1–0.9)
Monocytes: 6 %
Neutrophils Absolute: 3.6 10*3/uL (ref 1.4–7.0)
Neutrophils: 57 %
PLATELETS: 283 10*3/uL (ref 150–379)
RBC: 4.67 x10E6/uL (ref 3.77–5.28)
RDW: 13.8 % (ref 12.3–15.4)
WBC: 6.3 10*3/uL (ref 3.4–10.8)

## 2017-12-14 LAB — LIPID PANEL
Chol/HDL Ratio: 3.8 ratio (ref 0.0–4.4)
Cholesterol, Total: 173 mg/dL (ref 100–199)
HDL: 45 mg/dL (ref >39)
LDL CALC: 82 mg/dL (ref 0–99)
Triglycerides: 230 mg/dL — ABNORMAL HIGH (ref 0–149)
VLDL CHOLESTEROL CAL: 46 mg/dL — AB (ref 5–40)

## 2017-12-14 LAB — BASIC METABOLIC PANEL
BUN/Creatinine Ratio: 22 (ref 9–23)
BUN: 11 mg/dL (ref 6–24)
CHLORIDE: 107 mmol/L — AB (ref 96–106)
CO2: 23 mmol/L (ref 20–29)
Calcium: 9.7 mg/dL (ref 8.7–10.2)
Creatinine, Ser: 0.51 mg/dL — ABNORMAL LOW (ref 0.57–1.00)
GFR calc non Af Amer: 108 mL/min/{1.73_m2} (ref >59)
GFR, EST AFRICAN AMERICAN: 124 mL/min/{1.73_m2} (ref >59)
Glucose: 89 mg/dL (ref 65–99)
POTASSIUM: 4.5 mmol/L (ref 3.5–5.2)
SODIUM: 143 mmol/L (ref 134–144)

## 2017-12-14 LAB — TSH: TSH: 1.29 u[IU]/mL (ref 0.450–4.500)

## 2017-12-14 LAB — HEMOGLOBIN A1C
ESTIMATED AVERAGE GLUCOSE: 120 mg/dL
Hgb A1c MFr Bld: 5.8 % — ABNORMAL HIGH (ref 4.8–5.6)

## 2017-12-14 NOTE — Telephone Encounter (Signed)
Patient's CPAP application was faxed to the [employer] [phone]

## 2017-12-15 ENCOUNTER — Telehealth: Payer: Self-pay | Admitting: Internal Medicine

## 2017-12-15 NOTE — Telephone Encounter (Signed)
Call placed to the [employer] [phone] to check on the status of patient's Cpap application and to make a payment. No answer. Left message asking for a call back at [phone].

## 2017-12-22 ENCOUNTER — Telehealth: Payer: Self-pay | Admitting: Internal Medicine

## 2017-12-22 NOTE — Telephone Encounter (Signed)
Jamie from the [employer] returned my call and confirmed that patient's CPAP application was received. Made payment. Confirmation # [id number]

## 2017-12-22 NOTE — Telephone Encounter (Signed)
Call placed to the [employer] [phone] to check if they had received patient's Cpap application and to make a payment. No answer. Left message asking for a call back at [phone].

## 2017-12-25 ENCOUNTER — Ambulatory Visit: Payer: Self-pay | Attending: Internal Medicine

## 2017-12-29 ENCOUNTER — Telehealth (INDEPENDENT_AMBULATORY_CARE_PROVIDER_SITE_OTHER): Payer: Self-pay | Admitting: *Deleted

## 2017-12-29 NOTE — Telephone Encounter (Signed)
Medical Assistant used [employer] to contact patient.  Interpreter Name: Ander Purpura Interpreter #: [id number] Patient was not available, [employer] left patient a voicemail. Patient is aware of results being normal

## 2017-12-29 NOTE — Telephone Encounter (Signed)
-----   Message from Tresa Garter, MD sent at 12/18/2017  5:30 PM EST ----- Normal results

## 2018-01-01 ENCOUNTER — Telehealth: Payer: Self-pay

## 2018-01-01 NOTE — Telephone Encounter (Signed)
Call placed to [facility] with [facility] Respiratory Therapy Department and a CPAP teaching session was scheduled for the patient on 01/09/18 @ 0900.  Attempted to contact the patient to inform her of the teaching session. Calls placed to # [phone] and # [phone] with the assistance of Spanish Interpreter # [phone] with [employer]. HIPAA complaint voicemail messages were left at each # requesting a call back to the clinic # [phone].

## 2018-01-02 ENCOUNTER — Telehealth: Payer: Self-pay | Admitting: Internal Medicine

## 2018-01-02 NOTE — Telephone Encounter (Signed)
Call placed to patient #[phone] to inform her that her Cpap machine arrived and that the cpap teaching has been set up for Thursday 3/14 @ 9:00am. No answer. Left patient a message to return my call at [phone].  Also called the other number [phone] and spoke with patient's son in law. He stated that patient was at work. I asked him to please ask patient to return my call.

## 2018-01-02 NOTE — Telephone Encounter (Signed)
Pt. Returned call and was informed on the Cpap teaching for 01/11/18 @ 9:00a.m. Pt. Had no questions.

## 2018-01-10 ENCOUNTER — Telehealth: Payer: Self-pay

## 2018-01-10 NOTE — Telephone Encounter (Signed)
Susan Lopez, [facility] scheduler spoke to the patient and confirmed her CPAP teaching scheduled for tomorrow - 01/11/18 @ 0900 at [facility].

## 2018-01-11 ENCOUNTER — Telehealth: Payer: Self-pay | Admitting: Internal Medicine

## 2018-01-11 NOTE — Telephone Encounter (Signed)
Patient came to the office and received a training on how to use and clean Cpap machine from Cristie Hem [facility] Respiratory). Patient was given the machine to take home and use it daily. Patient was appreciative.

## 2018-01-19 MED FILL — MELOXICAM 7.5 MG TABLET: 7.5 | 30 days supply | Qty: 30 | Fill #1

## 2018-01-19 MED FILL — OMEPRAZOLE DR 40 MG CAPSULE: 40 | 30 days supply | Qty: 30 | Fill #4

## 2018-01-19 MED FILL — ATORVASTATIN 20 MG TABLET: 20 | 30 days supply | Qty: 30 | Fill #5

## 2018-01-19 MED FILL — GABAPENTIN 100 MG CAPSULE: 100 | 30 days supply | Qty: 60 | Fill #0

## 2018-01-19 MED FILL — LISINOPRIL 10 MG TABS: 10 | 30 days supply | Qty: 30 | Fill #0

## 2018-02-27 MED FILL — OMEPRAZOLE 40 MG CPDR: 40 | 30 days supply | Qty: 30 | Fill #5

## 2018-02-27 MED FILL — LISINOPRIL 10 MG TABS: 10 | 30 days supply | Qty: 30 | Fill #1

## 2018-02-27 MED FILL — ?ATORVASTATIN 20 MG TABLET: 20 | 30 days supply | Qty: 30 | Fill #6

## 2018-02-28 ENCOUNTER — Ambulatory Visit (INDEPENDENT_AMBULATORY_CARE_PROVIDER_SITE_OTHER): Payer: Self-pay | Admitting: Family Medicine

## 2018-02-28 ENCOUNTER — Encounter: Payer: Self-pay | Admitting: Family Medicine

## 2018-02-28 DIAGNOSIS — G8929 Other chronic pain: Secondary | ICD-10-CM

## 2018-02-28 DIAGNOSIS — M546 Pain in thoracic spine: Secondary | ICD-10-CM

## 2018-02-28 MED ORDER — CYCLOBENZAPRINE HCL 10 MG PO TABS: 10.0000 mg | ORAL_TABLET | Freq: Every evening | ORAL | 0 refills | Status: DC | PRN

## 2018-02-28 MED ORDER — MELOXICAM 7.5 MG PO TABS: 7.5000 mg | ORAL_TABLET | Freq: Every day | ORAL | 1 refills | Status: DC

## 2018-02-28 MED FILL — MELOXICAM 7.5 MG TABLET: 7.5 | 30 days supply | Qty: 30 | Fill #0

## 2018-02-28 MED FILL — CYCLOBENZAPRINE 10 MG TAB: 10 | 30 days supply | Qty: 30 | Fill #0

## 2018-02-28 NOTE — Progress Notes (Signed)
Chief complaint: Upper back pain x 2 years  History of present illness: Susan Lopez is a 57 year old female who presents to the sports medicine office today with chief complaint of upper back pain.  Of note, she is Spanish-speaking only, interpreter was present during entire encounter today and translated during the entire encounter today.  She reports that she has been having upper back pain for the last 2 years now.  She points to the upper thoracic region as to where she feels the pain.  She does not report of any specific inciting incident, trauma, or injury to explain the pain.  She points only to the right paraspinal musculature of the thoracic spine as where she feels most of the pain.  She reports pain with forward flexion and twisting of her back.  She does not report of any numbness, tingling, or burning paresthesias radiating down either arms or legs.  She does not report of any low back pain or neck pain.  She does not report of any bowel or bladder incontinence, does not report of any saddle anesthesia.  She does not report of any fevers, chills, night sweats, or any unintentional weight loss.  She reports that her back pain has been progressively worsening over the last 2 years.  Today, she describes the pain as a throbbing, aching, and occasionally sharp pain.  She rates the pain as an 8/10.  He mentioned this most recently to her primary physician back in September.  She was started on meloxicam and MRI of her thoracic spine without contrast was done.  Apparently the year prior she had MRI ordered but she did not get the MRI.  Previous chest x-ray from December 2017 was otherwise unremarkable in regards to bony anatomy.  MRI was done on 08/11/2017.  This did show a tiny right subarticular disc protrusion at T8-T9, with no associated stenosis.  This tiny right subarticular disc protrusion at this level was unchanged from previous MRI of her thoracic spine dated 03/24/2015.  She is currently unemployed,  but used to work in a [place] where she often had to bend down onto the machine.  She reports that the meloxicam is just slightly helpful.  She is also on gabapentin 2 mg 2 times daily, but does not report of any relief of symptoms.  Review of systems:  As stated above  Her past medical history, surgical history, family history, and social history obtained and reviewed.  Past medical history is notable for hypertension, obstructive sleep apnea, GERD, cystocele, internal hemorrhoids, history of acute cholecystitis status post cholecystectomy, iron deficiency anemia, leiomyoma of uterus, obesity, hyperlipidemia, depression, and history of left ankle fracture status post ORIF; surgical history notable for left ankle ORIF in 2017 subsequent removal 4 months later, cholecystectomy, C-section x2, and eye surgery; she does not report of any current tobacco use; family history notable for hypertension and cancer; allergies and medications are reviewed and are reflected in EMR.  Physical exam: Vital signs are reviewed and are documented in the chart Gen.: Alert, oriented, appears stated age, in no apparent distress HEENT: Moist oral mucosa Respiratory: Normal respirations, able to speak in full sentences Cardiac: Regular rate, distal pulses 2+ Integumentary: No rashes on visible skin:  Neurologic: Strength 5/5 in bilateral upper and lower extremities, sensation 2+ in bilateral upper and lower extremities Psych: Normal affect, mood is described as good Musculoskeletal: Inspection of her thoracic spine reveals no obvious deformity or muscle atrophy, no warmth, erythema, ecchymosis, or protuberance noted, no palpable  step-offs noted in the cervical spine, thoracic spine, or lumbar spine, no tenderness in the cervical spine or lumbar spine, she is tender palpation mainly in the paraspinal musculature of the thoracic spine on the right side, no tenderness to palpation over the scapular spine, no evidence of  scapular winging, Adams forward bend test does not show any type of scoliosis, he does report a slight pain with flexion, Spurling negative  Advanced imaging:  I personally reviewed her MRI of her thoracic spine without contrast dated 08/11/2017.  I do not see any acute bony or soft tissue abnormalities.  I do agree with radiology interpretation of small, unchanged right subarticular protrusion at T8-T9 without any associated spinal stenosis.  He has great vertebral body spacing in the thoracic spine  Assessment and plan: 1. Chronic thoracic back pain, seems related to strain of the paraspinal musculature of the thoracic spine  Plan: Discussed with [name] today that most of her upper back pain seems to be ligamentous in nature.  Discussed I do not see any evidence based on history, physical exam, or MRI to suggest any bony or nerve-related causes to explain this pain that she is having.  It seems to be more related to her movements thus, raising suspicion of muscle related cause.  She has just tried meloxicam as well as gabapentin.  I discussed to continue with the meloxicam, discontinue the gabapentin, will start her on Flexeril 10 mg to take at nighttime as muscle relaxant.  Discussed side effect of drowsiness, not to drive or operate any machinery or drink alcohol while using this.  Did also discuss formal physical therapy, at least 1-2 sessions to work on her upper back pain.  Discussed usefulness of TENS unit, iontophoresis, and dry needling.  Will leave this and additional measures to the discretion of her physical therapist.  Will plan to have her follow-up in 4 weeks if no interval improvement in her symptoms.  At that time, could consider evaluation by a orthopedic spine physician as a second opinion.  Otherwise, will have her follow-up sooner as needed.   Mort Sawyers, M.D. [facility]

## 2018-03-07 ENCOUNTER — Encounter: Payer: Self-pay | Admitting: Gastroenterology

## 2018-03-07 ENCOUNTER — Ambulatory Visit (INDEPENDENT_AMBULATORY_CARE_PROVIDER_SITE_OTHER): Payer: Self-pay | Admitting: Gastroenterology

## 2018-03-07 ENCOUNTER — Other Ambulatory Visit (INDEPENDENT_AMBULATORY_CARE_PROVIDER_SITE_OTHER): Payer: Self-pay

## 2018-03-07 VITALS — BP 124/76 | HR 80 | Ht 61.0 in | Wt 158.1 lb

## 2018-03-07 DIAGNOSIS — K5909 Other constipation: Secondary | ICD-10-CM

## 2018-03-07 DIAGNOSIS — R195 Other fecal abnormalities: Secondary | ICD-10-CM

## 2018-03-07 LAB — CBC WITH DIFFERENTIAL/PLATELET
BASOS ABS: 0.1 10*3/uL (ref 0.0–0.1)
BASOS PCT: 1.1 % (ref 0.0–3.0)
EOS PCT: 2.1 % (ref 0.0–5.0)
Eosinophils Absolute: 0.1 10*3/uL (ref 0.0–0.7)
HEMATOCRIT: 41.8 % (ref 36.0–46.0)
Hemoglobin: 14.3 g/dL (ref 12.0–15.0)
LYMPHS PCT: 28.7 % (ref 12.0–46.0)
Lymphs Abs: 1.9 10*3/uL (ref 0.7–4.0)
MCHC: 34.2 g/dL (ref 30.0–36.0)
MCV: 92.2 fl (ref 78.0–100.0)
MONOS PCT: 6.5 % (ref 3.0–12.0)
Monocytes Absolute: 0.4 10*3/uL (ref 0.1–1.0)
NEUTROS ABS: 4 10*3/uL (ref 1.4–7.7)
Neutrophils Relative %: 61.6 % (ref 43.0–77.0)
PLATELETS: 248 10*3/uL (ref 150.0–400.0)
RBC: 4.53 Mil/uL (ref 3.87–5.11)
RDW: 13.4 % (ref 11.5–15.5)
WBC: 6.5 10*3/uL (ref 4.0–10.5)

## 2018-03-07 LAB — COMPREHENSIVE METABOLIC PANEL
ALK PHOS: 96 U/L (ref 39–117)
ALT: 13 U/L (ref 0–35)
AST: 12 U/L (ref 0–37)
Albumin: 4.1 g/dL (ref 3.5–5.2)
BUN: 13 mg/dL (ref 6–23)
CO2: 28 mEq/L (ref 19–32)
Calcium: 9.3 mg/dL (ref 8.4–10.5)
Chloride: 106 mEq/L (ref 96–112)
Creatinine, Ser: 0.56 mg/dL (ref 0.40–1.20)
GFR: 118.5 mL/min (ref >60.00)
Glucose, Bld: 99 mg/dL (ref 70–99)
POTASSIUM: 4.3 meq/L (ref 3.5–5.1)
Sodium: 142 mEq/L (ref 135–145)
TOTAL PROTEIN: 7.2 g/dL (ref 6.0–8.3)
Total Bilirubin: 0.5 mg/dL (ref 0.2–1.2)

## 2018-03-07 MED ORDER — PEG 3350-KCL-NA BICARB-NACL 420 G PO SOLR: 4000.0000 mL | ORAL | 0 refills | Status: DC

## 2018-03-07 NOTE — Progress Notes (Signed)
Review of pertinent gastrointestinal problems: 1.  Chronic constipation, generalized abdominal pain.  Evaluation 09/2013 with colonoscopy found a single subCM adenoma.  I recommended repeat colonoscopy at 5-year interval   HPI: This is a very pleasant 57 year old woman who was referred to me by Tresa Garter, MD  to evaluate chronic constipation, generalized abdominal pain, abnormal finding in stool.    Chief complaint is chronic constipation, generalized abdominal pain, abnormal finding in stool  Spanish interpreter present  She has chronic constipation again: recent BM felt tissue like material came out and she is very worried about that.  It was not overtly bloody.  Has lost 10 pounds in the past year.  Takes unknown probiotic, orange fiber supplement (most days).  She used to take miralax but she stopped for unclear reasons it although it helped.  As long as she takes fiber her bowels are good.  She has intermittent epigastric, right upper quadrant pain.  She had her gallbladder removed remotely.  She says these pains do seem to be improved somewhat when she moves her bowels.  She does not have nausea or vomiting with them.  Weight down 8 pounds since last visit here 4 years ago.  Old Data Reviewed: Labs February 2019: CBC was normal, TSH was normal, complete metabolic profile was normal.    Review of systems: Pertinent positive and negative review of systems were noted in the above HPI section. All other review negative.   Past Medical History:  Diagnosis Date  . Anxiety   . Arthritis   . Bone lesion 10/04/2013  . Calculus of gallbladder with acute and chronic cholecystitis without obstruction, s/p lap chole [id number] 09/19/2011  . Complication of anesthesia   . Depression   . Gallstones   . Hemorrhoids, external 10-21-11   occ. bothersome  . Hyperlipidemia   . Hypertension   . Iron deficiency anemia   . Mammogram abnormal 10/04/2013  . PONV (postoperative  nausea and vomiting)     Past Surgical History:  Procedure Laterality Date  . [place]  . CHOLECYSTECTOMY  10/27/2011   Procedure: LAPAROSCOPIC CHOLECYSTECTOMY WITH INTRAOPERATIVE CHOLANGIOGRAM;  Surgeon: Adin Hector, MD;  Location: [facility];  Service: General;  Laterality: N/A;  Laparoscopic Chole w/ IOC Single Site  . EYE SURGERY  1`12-21-10   bil. for tissue growth-laser surgery  . HARDWARE REMOVAL Left 03/16/2016   Procedure: LEFT ANKLE HARDWARE REMOVAL;  Surgeon: Leandrew Koyanagi, MD;  Location: [facility];  Service: Orthopedics;  Laterality: Left;  . ORIF ANKLE FRACTURE Left 11/13/2015   Procedure: OPEN REDUCTION INTERNAL FIXATION (ORIF) LEFT BIMALLEOLAR ANKLE FRACTURE;  Surgeon: Leandrew Koyanagi, MD;  Location: [facility];  Service: Orthopedics;  Laterality: Left;    Current Outpatient Medications  Medication Sig Dispense Refill  . Probiotic Product (PROBIOTIC PO) Take by mouth daily.    Marland Kitchen aspirin EC 325 MG tablet Take 1 tablet (325 mg total) by mouth 2 (two) times daily. (Patient not taking: Reported on 10/07/2016) 84 tablet 0  . atorvastatin (LIPITOR) 20 MG tablet Take 1 tablet (20 mg total) by mouth daily. 90 tablet 3  . cyclobenzaprine (FLEXERIL) 10 MG tablet Take 1 tablet (10 mg total) by mouth at bedtime as needed for muscle spasms. 30 tablet 0  . gabapentin (NEURONTIN) 100 MG capsule Take 1 capsule (100 mg total) by mouth 2 (two) times daily. (Patient not taking: Reported on 03/07/2018) 180 capsule 3  . lisinopril (PRINIVIL,ZESTRIL) 10 MG tablet  Take 1 tablet (10 mg total) by mouth daily. 90 tablet 3  . meloxicam (MOBIC) 7.5 MG tablet Take 1 tablet (7.5 mg total) by mouth daily. 30 tablet 1  . Multiple Vitamin (MULTIVITAMIN WITH MINERALS) TABS tablet Take 1 tablet by mouth daily.    Marland Kitchen omeprazole (PRILOSEC) 40 MG capsule Take 1 capsule (40 mg total) by mouth daily. 90 capsule 3   No current facility-administered medications for this visit.     Allergies  as of 03/07/2018 - Review Complete 03/07/2018  Allergen Reaction Noted  . Hydrocodone Nausea And Vomiting 10/27/2011  . Tramadol Nausea Only 11/13/2015  . Oxycodone Rash 11/14/2011    Family History  Problem Relation Age of Onset  . Hypertension Mother   . Cancer Mother   . Hypertension Father   . Colon cancer Neg Hx     Social History   Socioeconomic History  . Marital status: Single    Spouse name: Not on file  . Number of children: Not on file  . Years of education: Not on file  . Highest education level: Not on file  Occupational History  . Not on file  Social Needs  . Financial resource strain: Not on file  . Food insecurity:    Worry: Not on file    Inability: Not on file  . Transportation needs:    Medical: Not on file    Non-medical: Not on file  Tobacco Use  . Smoking status: Former Smoker    Packs/day: 0.25    Years: 10.00    Pack years: 2.50    Last attempt to quit: 10/20/2004    Years since quitting: 13.3  . Smokeless tobacco: Never Used  Substance and Sexual Activity  . Alcohol use: No  . Drug use: No  . Sexual activity: Never    Birth control/protection: Post-menopausal  Lifestyle  . Physical activity:    Days per week: 7 days    Minutes per session: 20 min  . Stress: Only a little  Relationships  . Social connections:    Talks on phone: More than three times a week    Gets together: Once a week    Attends religious service: More than 4 times per year    Active member of club or organization: No    Attends meetings of clubs or organizations: Never    Relationship status: Separated  . Intimate partner violence:    Fear of current or ex partner: No    Emotionally abused: No    Physically abused: No    Forced sexual activity: No  Other Topics Concern  . Not on file  Social History Narrative  . Not on file     Physical Exam: BP 124/76   Pulse 80   Ht 5\' 1"  (1.549 m)   Wt 158 lb 2 oz (71.7 kg)   LMP 10/08/2015   BMI 29.88 kg/m   Constitutional: generally well-appearing Psychiatric: alert and oriented x3 Eyes: extraocular movements intact Mouth: oral pharynx moist, no lesions Neck: supple no lymphadenopathy Cardiovascular: heart regular rate and rhythm Lungs: clear to auscultation bilaterally Abdomen: soft, nontender, nondistended, no obvious ascites, no peritoneal signs, normal bowel sounds Extremities: no lower extremity edema bilaterally Skin: no lesions on visible extremities   Assessment and plan: 57 y.o. female with chronic constipation, history of tubular adenoma, abnormal finding stable, generalized abdominal pain  She had a small tubular adenoma removed about 4-1/2 years ago.  Recently she is been continuing to struggle with  constipation and saw some tissue-like material come out when she moved her bowels.  It was not overtly bloody.  I doubt that this was truly tissue but given her history of precancerous polyp I recommended a colonoscopy now instead of later this year when she was already planned for recall, surveillance colonoscopy.  Her generalized abdominal pains are probably related to her constipation.  Constipation seems to be improved as long as she continues taking fiber on a daily basis I recommend she continue that.  She will get a basic set of labs today and ultrasound just to make sure we are not missing something more significant in her abdomen.    Please see the "Patient Instructions" section for addition details about the plan.   Owens Loffler, MD [facility] 03/07/2018, 9:53 AM  Cc: Tresa Garter, MD

## 2018-03-07 NOTE — Patient Instructions (Addendum)
You will be set up for an ultrasound for generalized abdominal pain. You will have labs checked today in the basement lab.  Please head down after you check out with the front desk  (cbc, cmet). You will be set up for a colonoscopy for chronic constipation, h/o polyp.  You have been scheduled for an abdominal ultrasound at [facility] (1st floor of hospital) on 03/12/18 at [place]. Please arrive 15 minutes prior to your appointment for registration. Make certain not to have anything to eat or drink 6 hours prior to your appointment. Should you need to reschedule your appointment, please contact radiology at [phone]. This test typically takes about 30 minutes to perform.  Normal BMI (Body Mass Index- based on height and weight) is between 19 and 25. Your BMI today is Body mass index is 29.88 kg/m. Marland Kitchen Please consider follow up  regarding your BMI with your Primary Care Provider.

## 2018-03-12 ENCOUNTER — Ambulatory Visit (HOSPITAL_COMMUNITY)
Admission: RE | Admit: 2018-03-12 | Discharge: 2018-03-12 | Disposition: A | Discharge status: Home / Self Care | Payer: Self-pay | Source: Ambulatory Visit | Attending: Gastroenterology | Admitting: Gastroenterology

## 2018-03-12 DIAGNOSIS — R195 Other fecal abnormalities: Secondary | ICD-10-CM | POA: Insufficient documentation

## 2018-03-12 DIAGNOSIS — K5909 Other constipation: Secondary | ICD-10-CM | POA: Insufficient documentation

## 2018-03-30 MED FILL — LISINOPRIL 10 MG TABS: 10 | 30 days supply | Qty: 30 | Fill #2

## 2018-03-30 MED FILL — OMEPRAZOLE DR 40 MG CAPSULE: 40 | 30 days supply | Qty: 30 | Fill #6

## 2018-03-30 MED FILL — ?ATORVASTATIN 20 MG TABLET: 20 | 30 days supply | Qty: 30 | Fill #7

## 2018-05-02 MED FILL — OMEPRAZOLE DR 40 MG CAPSULE: 40 | 30 days supply | Qty: 30 | Fill #7

## 2018-05-02 MED FILL — LISINOPRIL 10 MG TABS: 10 | 30 days supply | Qty: 30 | Fill #3

## 2018-05-02 MED FILL — ?ATORVASTATIN 20 MG TABLET: 20 | 30 days supply | Qty: 30 | Fill #8

## 2018-05-09 ENCOUNTER — Other Ambulatory Visit: Payer: Self-pay

## 2018-05-09 ENCOUNTER — Encounter: Payer: Self-pay | Admitting: Gastroenterology

## 2018-05-09 ENCOUNTER — Ambulatory Visit (AMBULATORY_SURGERY_CENTER): Payer: Self-pay | Admitting: Gastroenterology

## 2018-05-09 VITALS — BP 117/60 | HR 70 | Temp 98.9°F | Resp 14 | Ht 61.0 in | Wt 158.0 lb

## 2018-05-09 DIAGNOSIS — D124 Benign neoplasm of descending colon: Secondary | ICD-10-CM

## 2018-05-09 DIAGNOSIS — R195 Other fecal abnormalities: Secondary | ICD-10-CM

## 2018-05-09 DIAGNOSIS — D123 Benign neoplasm of transverse colon: Secondary | ICD-10-CM

## 2018-05-09 DIAGNOSIS — K5909 Other constipation: Secondary | ICD-10-CM

## 2018-05-09 MED ORDER — SODIUM CHLORIDE 0.9 % IV SOLN: 500.0000 mL | Freq: Once | INTRAVENOUS | Status: DC

## 2018-05-09 NOTE — Progress Notes (Signed)
Report to PACU, RN, vss, BBS= Clear.  

## 2018-05-09 NOTE — Progress Notes (Signed)
Called to room to assist during endoscopic procedure.  Patient ID and intended procedure confirmed with present staff. Received instructions for my participation in the procedure from the performing physician.  

## 2018-05-09 NOTE — Op Note (Signed)
[place] Patient Name: Susan Lopez Procedure Date: 05/09/2018 1:06 PM MRN: [id number] Endoscopist: Milus Banister , MD Age: 57 Referring MD:  Date of Birth: 10-22-1961 Gender: Female Account #: [id number] Procedure:                Colonoscopy Indications:              Screening for colorectal malignant neoplasm Medicines:                Monitored Anesthesia Care Procedure:                Pre-Anesthesia Assessment:                           - Prior to the procedure, a History and Physical                            was performed, and patient medications and                            allergies were reviewed. The patient's tolerance of                            previous anesthesia was also reviewed. The risks                            and benefits of the procedure and the sedation                            options and risks were discussed with the patient.                            All questions were answered, and informed consent                            was obtained. Prior Anticoagulants: The patient has                            taken no previous anticoagulant or antiplatelet                            agents. ASA Grade Assessment: II - A patient with                            mild systemic disease. After reviewing the risks                            and benefits, the patient was deemed in                            satisfactory condition to undergo the procedure.                           After obtaining informed consent, the colonoscope  was passed under direct vision. Throughout the                            procedure, the patient's blood pressure, pulse, and                            oxygen saturations were monitored continuously. The                            Colonoscope was introduced through the anus and                            advanced to the the cecum, identified by                            appendiceal  orifice and ileocecal valve. The                            colonoscopy was performed without difficulty. The                            patient tolerated the procedure well. The quality                            of the bowel preparation was good. The ileocecal                            valve, appendiceal orifice, and rectum were                            photographed. Scope In: 1:41:08 PM Scope Out: 1:52:10 PM Scope Withdrawal Time: 0 hours 9 minutes 34 seconds  Total Procedure Duration: 0 hours 11 minutes 2 seconds  Findings:                 Two sessile polyps were found in the descending                            colon and transverse colon. The polyps were 2 to 4                            mm in size. These polyps were removed with a cold                            snare. Resection and retrieval were complete.                           The exam was otherwise without abnormality on                            direct and retroflexion views. Complications:            No immediate complications. Estimated blood loss:  None. Estimated Blood Loss:     Estimated blood loss: none. Impression:               - Two 2 to 4 mm polyps in the descending colon and                            in the transverse colon, removed with a cold snare.                            Resected and retrieved.                           - The examination was otherwise normal on direct                            and retroflexion views. Recommendation:           - Patient has a contact number available for                            emergencies. The signs and symptoms of potential                            delayed complications were discussed with the                            patient. Return to normal activities tomorrow.                            Written discharge instructions were provided to the                            patient.                           - Resume previous diet.                            - Continue present medications.                           You will receive a letter within 2-3 weeks with the                            pathology results and my final recommendations.                           If the polyp(s) is proven to be 'pre-cancerous' on                            pathology, you will need repeat colonoscopy in 5                            years. If the polyp(s) is NOT 'precancerous' on  pathology then you should repeat colon cancer                            screening in 10 years with colonoscopy without need                            for colon cancer screening by any method prior to                            then (including stool testing). Milus Banister, MD 05/09/2018 1:54:30 PM This report has been signed electronically.

## 2018-05-09 NOTE — Progress Notes (Signed)
History reviewed today  Interpreter used today at the [facility] for this pt.  Interpreter's name is-Julia Sowell

## 2018-05-09 NOTE — Patient Instructions (Addendum)
Handout given on polyps   USTED TUVO UN PROCEDIMIENTO ENDOSCPICO HOY EN EL [facility]:   Lea el informe del procedimiento que se le entreg para cualquier pregunta especfica sobre lo que se Primary school teacher.  Si el informe del examen no responde a sus preguntas, por favor llame a su gastroenterlogo para aclararlo.  Si usted solicit que no se le den [employer] de lo que se Estate manager/land agent en su procedimiento al [employer] va a cuidar, entonces el informe del procedimiento se ha incluido en un sobre sellado para que usted lo revise despus cuando le sea ms conveniente.   LO QUE PUEDE ESPERAR: Algunas sensaciones de hinchazn en el abdomen.  Puede tener ms gases de lo normal.  El caminar puede ayudarle a eliminar el aire que se le puso en el tracto gastrointestinal durante el procedimiento y reducir la hinchazn.  Si le hicieron una endoscopia inferior (como una colonoscopia o una sigmoidoscopia flexible), podra notar manchas de sangre en las heces fecales o en el papel higinico.  Si se someti a una preparacin intestinal para su procedimiento, es posible que no tenga una evacuacin intestinal normal durante [employer].   Tenga en cuenta:  Es posible que note un poco de irritacin y congestin en la nariz o algn drenaje.  Esto es debido al oxgeno [employer] durante su procedimiento.  No hay que preocuparse y esto debe desaparecer ms o Scientist, research (medical).   SNTOMAS PARA REPORTAR INMEDIATAMENTE:  Despus de una endoscopia inferior (colonoscopia o sigmoidoscopia flexible):  Cantidades excesivas de sangre en las heces fecales  Sensibilidad significativa o empeoramiento de los dolores abdominales   Hinchazn aguda del abdomen que antes no tena   Fiebre de 100F o ms   Despus de la endoscopia superior (EGD)  Vmitos de Biochemist, clinical o material como caf molido   Dolor en el pecho o dolor debajo de los omplatos que antes no tena   Dolor o dificultad persistente para tragar  Falta de aire que antes no tena   Fiebre de 100F o ms  Heces fecales negras y pegajosas   Para asuntos urgentes o de Freight forwarder, puede comunicarse con un gastroenterlogo a cualquier hora llamando al [phone].  DIETA:  Recomendamos una comida pequea al principio, pero luego puede continuar con su dieta normal.  Tome muchos lquidos, Teacher, adult education las bebidas alcohlicas durante 24 horas.    ACTIVIDAD:  Debe planear tomarse las cosas con calma por el resto del da y no debe CONDUCIR ni usar maquinaria pesada Programmer, applications (debido a los medicamentos de sedacin utilizados durante el examen).     SEGUIMIENTO: Nuestro personal llamar al nmero que aparece en su historial al siguiente da hbil de su procedimiento para ver cmo se siente y para responder cualquier pregunta o inquietud que pueda tener con respecto a la informacin que se le dio despus del procedimiento. Si no podemos contactarle, le dejaremos un mensaje.  Sin embargo, si se siente bien y no tiene Paediatric nurse, no es necesario que nos devuelva la llamada.  Asumiremos que ha regresado a sus actividades diarias normales sin incidentes. Si se le tomaron algunas biopsias, le contactaremos por telfono o por carta en las prximas 3 semanas.  Si no ha sabido [employer] biopsias en el transcurso de 3 semanas, por favor llmenos al [phone].   FIRMAS/CONFIDENCIALIDAD: Usted y/o el acompaante que le cuide han firmado documentos que se ingresarn en su historial mdico electrnico.  Estas firmas atestiguan el hecho de que la informacin anterior

## 2018-05-10 ENCOUNTER — Telehealth: Payer: Self-pay | Admitting: *Deleted

## 2018-05-10 NOTE — Telephone Encounter (Signed)
  Follow up Call-  Call back number 05/09/2018  Post procedure Call Back phone  # [phone]  Permission to leave phone message Yes  Some recent data might be hidden     Patient questions:  Message left to call us if necessary.  Second call.

## 2018-05-10 NOTE — Telephone Encounter (Signed)
  Follow up Call-  Call back number 05/09/2018  Post procedure Call Back phone  # [phone]  Permission to leave phone message Yes  Some recent data might be hidden     Patient questions:  Message left to call us if necessary.

## 2018-05-15 ENCOUNTER — Encounter: Payer: Self-pay | Admitting: Gastroenterology

## 2018-06-05 ENCOUNTER — Telehealth: Payer: Self-pay | Admitting: Internal Medicine

## 2018-06-05 MED FILL — OMEPRAZOLE DR 40 MG CAPSULE: 40 | 30 days supply | Qty: 30 | Fill #8

## 2018-06-05 MED FILL — ?ATORVASTATIN 20 MG TABLET: 20 | 30 days supply | Qty: 30 | Fill #9

## 2018-06-05 MED FILL — LISINOPRIL 10 MG TABS: 10 | 30 days supply | Qty: 30 | Fill #4

## 2018-06-05 NOTE — Telephone Encounter (Signed)
Patient called requesting refills for her  lisinopril (PRINIVIL,ZESTRIL) 10 MG tablet [[id number]]   omeprazole (PRILOSEC) 40 MG capsule [[id number]]   atorvastatin (LIPITOR) 20 MG tablet [[id number]  Please follow up.

## 2018-06-05 NOTE — Telephone Encounter (Signed)
Refilled @ [facility], will be ready by tomorrow AM

## 2018-06-29 ENCOUNTER — Ambulatory Visit: Payer: Self-pay | Attending: Family Medicine

## 2018-07-05 ENCOUNTER — Telehealth: Payer: Self-pay | Admitting: Internal Medicine

## 2018-07-05 NOTE — Telephone Encounter (Signed)
Pt was called to informed her that the CAFA was denied for faulse information sumited  At the time of the application, Pt understood the decision.

## 2018-07-06 MED FILL — ATORVASTATIN 20 MG TABLET: 20 | 30 days supply | Qty: 30 | Fill #0

## 2018-07-06 MED FILL — OMEPRAZOLE DR 40 MG CAPSULE: 40 | 30 days supply | Qty: 30 | Fill #0

## 2018-07-06 MED FILL — LISINOPRIL 10 MG TABS: 10 | 30 days supply | Qty: 30 | Fill #1

## 2018-07-18 ENCOUNTER — Ambulatory Visit: Payer: Self-pay | Attending: Family Medicine | Admitting: Family Medicine

## 2018-07-18 ENCOUNTER — Other Ambulatory Visit: Payer: Self-pay

## 2018-07-18 ENCOUNTER — Encounter: Payer: Self-pay | Admitting: Family Medicine

## 2018-07-18 VITALS — BP 131/85 | HR 72 | Temp 98.7°F | Resp 18 | Ht 59.0 in | Wt 163.8 lb

## 2018-07-18 DIAGNOSIS — I10 Essential (primary) hypertension: Secondary | ICD-10-CM

## 2018-07-18 DIAGNOSIS — N3946 Mixed incontinence: Secondary | ICD-10-CM

## 2018-07-18 DIAGNOSIS — K219 Gastro-esophageal reflux disease without esophagitis: Secondary | ICD-10-CM

## 2018-07-18 DIAGNOSIS — K5909 Other constipation: Secondary | ICD-10-CM

## 2018-07-18 DIAGNOSIS — Z885 Allergy status to narcotic agent status: Secondary | ICD-10-CM | POA: Insufficient documentation

## 2018-07-18 DIAGNOSIS — R8290 Unspecified abnormal findings in urine: Secondary | ICD-10-CM

## 2018-07-18 DIAGNOSIS — Z888 Allergy status to other drugs, medicaments and biological substances status: Secondary | ICD-10-CM | POA: Insufficient documentation

## 2018-07-18 DIAGNOSIS — Z79899 Other long term (current) drug therapy: Secondary | ICD-10-CM

## 2018-07-18 DIAGNOSIS — M546 Pain in thoracic spine: Secondary | ICD-10-CM

## 2018-07-18 DIAGNOSIS — R21 Rash and other nonspecific skin eruption: Secondary | ICD-10-CM

## 2018-07-18 DIAGNOSIS — E785 Hyperlipidemia, unspecified: Secondary | ICD-10-CM

## 2018-07-18 DIAGNOSIS — F419 Anxiety disorder, unspecified: Secondary | ICD-10-CM | POA: Insufficient documentation

## 2018-07-18 DIAGNOSIS — Z87891 Personal history of nicotine dependence: Secondary | ICD-10-CM | POA: Insufficient documentation

## 2018-07-18 DIAGNOSIS — F329 Major depressive disorder, single episode, unspecified: Secondary | ICD-10-CM | POA: Insufficient documentation

## 2018-07-18 DIAGNOSIS — G8929 Other chronic pain: Secondary | ICD-10-CM

## 2018-07-18 DIAGNOSIS — G4730 Sleep apnea, unspecified: Secondary | ICD-10-CM | POA: Insufficient documentation

## 2018-07-18 LAB — POCT URINALYSIS DIP (CLINITEK)
Bilirubin, UA: NEGATIVE
Glucose, UA: NEGATIVE mg/dL
Ketones, POC UA: NEGATIVE mg/dL
Leukocytes, UA: NEGATIVE
Nitrite, UA: NEGATIVE
POC,PROTEIN,UA: NEGATIVE
Spec Grav, UA: 1.015
Urobilinogen, UA: 0.2 U/dL
pH, UA: 6.5

## 2018-07-18 MED ORDER — ATORVASTATIN CALCIUM 20 MG PO TABS: 20.0000 mg | ORAL_TABLET | Freq: Every day | ORAL | 3 refills | Status: DC

## 2018-07-18 MED ORDER — LISINOPRIL 10 MG PO TABS: 10.0000 mg | ORAL_TABLET | Freq: Every day | ORAL | 3 refills | Status: DC

## 2018-07-18 MED ORDER — GABAPENTIN 100 MG PO CAPS: 100.0000 mg | ORAL_CAPSULE | Freq: Two times a day (BID) | ORAL | 3 refills | Status: DC

## 2018-07-18 MED ORDER — CLOTRIMAZOLE-BETAMETHASONE 1-0.05 % EX CREA: 1.0000 "application " | TOPICAL_CREAM | Freq: Two times a day (BID) | CUTANEOUS | 2 refills | Status: DC

## 2018-07-18 MED ORDER — OMEPRAZOLE 40 MG PO CPDR: 40.0000 mg | DELAYED_RELEASE_CAPSULE | Freq: Every day | ORAL | 3 refills | Status: DC

## 2018-07-18 MED ORDER — TOLTERODINE TARTRATE ER 4 MG PO CP24: 4.0000 mg | ORAL_CAPSULE | Freq: Every day | ORAL | 6 refills | Status: DC

## 2018-07-18 MED ORDER — CYCLOBENZAPRINE HCL 10 MG PO TABS: 10.0000 mg | ORAL_TABLET | Freq: Every evening | ORAL | 0 refills | Status: DC | PRN

## 2018-07-18 MED ORDER — POLYETHYLENE GLYCOL 3350 17 GM/SCOOP PO POWD: 17.0000 g | Freq: Every day | ORAL | 11 refills | Status: DC

## 2018-07-18 MED ORDER — MELOXICAM 7.5 MG PO TABS: 7.5000 mg | ORAL_TABLET | Freq: Every day | ORAL | 4 refills | Status: DC

## 2018-07-18 MED FILL — GABAPENTIN 100 MG CAPSULE: 100 | 30 days supply | Qty: 60 | Fill #0

## 2018-07-18 MED FILL — CLOTRIMAZOLE-BETAMETHASONE: 1-0.05 | 15 days supply | Qty: 30 | Fill #0

## 2018-07-18 MED FILL — CYCLOBENZAPRINE 10 MG TAB: 10 | 30 days supply | Qty: 30 | Fill #0

## 2018-07-18 MED FILL — POLYETHYLENE GLYCOL 3350 PO: 14 days supply | Qty: 238 | Fill #0

## 2018-07-18 MED FILL — TOLTERODINE TART ER 4 MG CA: 4 | 30 days supply | Qty: 30 | Fill #0

## 2018-07-18 MED FILL — MELOXICAM 7.5 MG TABLET: 7.5 | 30 days supply | Qty: 30 | Fill #0

## 2018-07-18 NOTE — Progress Notes (Signed)
Subjective:    Patient ID: Susan Lopez, female    DOB: 03-02-1961, 57 y.o.   MRN: [id number]   Due to a language barrier, an interpreter was used at today's visit  HPI 57 year old female who was last seen in the office on 12/13/2017 presented follow-up of hypertension, dyslipidemia, chronic back pain, and acid reflux.  Patient reports new complaint of a rash in her external vaginal area.  Patient states that the rash has been present for months.  There has been no redness, but the area is occasionally itchy.  Upon questioning, patient states that she does have issues with leakage of urine and she is started to wear a bladder control pad when she will be away from home.  Patient reports some urinary leakage with coughing, laughing or sneezing.  Patient also has urinary leakage when she gets the sudden urge to urinate but then cannot make it to the bathroom on time.  Patient also states that this has been occurring for some time.  Patient has never taken medication to help with her urinary leakage but would be interested in doing so.      Patient reports that she continues to take her lisinopril for hypertension.  Patient believes that her blood pressure is well controlled.  Patient denies any headaches or dizziness related to her blood pressure.  Patient does have a history of vertigo but has had no recent issues.  Patient has an occasional, dry, but she does not believe it is related to her blood pressure medication and the cough is not bothersome.      Patient continues to have recurrent mid back pain.  Pain can be as much as a 6-7 on a 0-to-10 scale.  Patient is dull and aching but occasionally sharp and throbbing.  Patient states that she has gotten some relief of her pain with continued use of gabapentin, Flexeril and Mobic.  Patient denies any acid reflux symptoms with the use of Mobic and she takes the medication prior to eating.  Patient denies any acid reflux symptoms such as burping,  belching or nausea as long as she takes her omeprazole daily.      Patient continues to have chronic issues with constipation but with use of fiber supplement and probiotics, her constipation is not as bad as it was in the past.  Patient did have colonoscopy done this summer.  Patient still has occasional generalized abdominal discomfort.  Patient denies fever or chills.  Patient has seen no blood in the stool and no dark stools.  Patient states that the abdominal discomfort is often in her right abdomen, patient places her hand on this area to demonstrate with abdominal discomfort occurs. Past Medical History:  Diagnosis Date  . Allergy   . Anxiety   . Arthritis   . Bone lesion 10/04/2013  . Calculus of gallbladder with acute and chronic cholecystitis without obstruction, s/p lap chole [id number] 09/19/2011  . Cataract 2009   bilateral  . Complication of anesthesia   . Depression   . Gallstones   . GERD (gastroesophageal reflux disease)   . Hemorrhoids, external 10-21-11   occ. bothersome  . Hyperlipidemia   . Hypertension   . Iron deficiency anemia   . Mammogram abnormal 10/04/2013  . PONV (postoperative nausea and vomiting)   . Sleep apnea    Past Surgical History:  Procedure Laterality Date  . [place]  . CHOLECYSTECTOMY  10/27/2011   Procedure: LAPAROSCOPIC CHOLECYSTECTOMY WITH INTRAOPERATIVE  CHOLANGIOGRAM;  Surgeon: Adin Hector, MD;  Location: [facility];  Service: General;  Laterality: N/A;  Laparoscopic Chole w/ IOC Single Site  . EYE SURGERY  1`12-21-10   bil. for tissue growth-laser surgery  . HARDWARE REMOVAL Left 03/16/2016   Procedure: LEFT ANKLE HARDWARE REMOVAL;  Surgeon: Leandrew Koyanagi, MD;  Location: [place];  Service: Orthopedics;  Laterality: Left;  [facility] MM BREAST STEREO BIOPSY LEFT ([place] HX) Left 2015  . ORIF ANKLE FRACTURE Left 11/13/2015   Procedure: OPEN REDUCTION INTERNAL FIXATION (ORIF) LEFT BIMALLEOLAR ANKLE FRACTURE;  Surgeon:  Leandrew Koyanagi, MD;  Location: [facility];  Service: Orthopedics;  Laterality: Left;   Family History  Problem Relation Age of Onset  . Hypertension Mother   . Cancer Mother   . Hypertension Father   . Colon cancer Neg Hx    Social History   Tobacco Use  . Smoking status: Former Smoker    Packs/day: 0.25    Years: 10.00    Pack years: 2.50    Last attempt to quit: 10/20/2004    Years since quitting: 13.7  . Smokeless tobacco: Never Used  Substance Use Topics  . Alcohol use: No  . Drug use: No   Allergies  Allergen Reactions  . Hydrocodone Nausea And Vomiting  . Tramadol Nausea Only  . Oxycodone Rash     Review of Systems  Constitutional: Negative for chills and fever.  HENT: Negative for sore throat and trouble swallowing.   Respiratory: Negative for cough and shortness of breath.   Cardiovascular: Negative for chest pain, palpitations and leg swelling.  Gastrointestinal: Positive for abdominal pain and constipation. Negative for blood in stool and nausea.  Genitourinary: Positive for urgency. Negative for dysuria, flank pain and frequency.  Musculoskeletal: Positive for back pain. Negative for gait problem and joint swelling.  Neurological: Negative for dizziness and headaches.       Objective:   Physical Exam BP 131/85   Pulse 72   Temp 98.7 F (37.1 C) (Oral)   Resp 18   Ht 4\' 11"  (1.499 m)   Wt 163 lb 12.8 oz (74.3 kg)   LMP 10/08/2015   SpO2 95%   BMI 33.08 kg/m Vital signs and nurse's note reviewed General-well-nourished, well-developed older female in no acute distress ENT- TMs normal, nasal exam normal, patient with slightly narrowed posterior pharynx, mild posterior pharynx erythema Neck-supple, no lymphadenopathy, no thyromegaly, no carotid bruit Cardiovascular-regular rate and rhythm Lungs-clear to auscultation bilaterally Back- no CVA tenderness, patient with some mild mid thoracic paraspinous spasm and discomfort to palpation Abdomen- soft, mild  discomfort to palpation just above the umbilicus but no rebound or guarding, no suprapubic tenderness Extremities-no edema     Assessment & Plan:  1. Essential hypertension Blood pressure at today's visit with good control.  Patient is to continue lisinopril 10 mg as well as low-sodium diet.  Regular exercise as tolerated is encouraged.  Patient will have CMP in follow-up - Comprehensive metabolic panel - lisinopril (PRINIVIL,ZESTRIL) 10 MG tablet; Take 1 tablet (10 mg total) by mouth daily.  Dispense: 90 tablet; Refill: 3  2. Dyslipidemia Patient with history of dyslipidemia which is being controlled with use of Lipitor which patient will continue.  Patient did have lipid panel done in May of this year and LDL and HDL were both within normal however patient with continued elevated triglycerides but triglyceride level had improved from the year prior.  Patient will have fasting lipid panel at her  next visit and if she continues to have elevated triglycerides, will consider addition of TriCor or Lopid to help lower her triglycerides.  Low-fat diet and exercise are encouraged at this time and continued use of Lipitor.  Patient will have CMP today in follow-up of long-term use of Lipitor/statin medication. - Comprehensive metabolic panel - atorvastatin (LIPITOR) 20 MG tablet; Take 1 tablet (20 mg total) by mouth daily.  Dispense: 90 tablet; Refill: 3  3. Chronic thoracic spine pain Patient with chronic thoracic spine pain.  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  Patient given refills of gabapentin, Flexeril and Mobic which have helped with her pain so far. - polyethylene glycol powder (GLYCOLAX/MIRALAX) powder; Take 17 g by mouth daily. As needed for constipation relief  Dispense: 3350 g; Refill: 11 - cyclobenzaprine (FLEXERIL) 10 MG tablet; Take 1 tablet (10 mg total) by mouth at bedtime as needed for muscle spasms.  Dispense: 30 tablet; Refill: 0 -  gabapentin (NEURONTIN) 100 MG capsule; Take 1 capsule (100 mg total) by mouth 2 (two) times daily.  Dispense: 180 capsule; Refill: 3 - meloxicam (MOBIC) 7.5 MG tablet; Take 1 tablet (7.5 mg total) by mouth daily.  Dispense: 30 tablet; Refill: 4  4. Chronic constipation Patient with chronic issues with constipation.  Patient's chart was reviewed.  Patient has seen her gastroenterologist earlier this year regarding her chronic constipation for which she is currently on probiotics and fiber supplement.  Patient was also having complaints of possibly passing abnormal tissue and since patient had removal of adenoma on colonoscopy done in 2014, patient's colonoscopy was repeated in July with pathology showing that the 2 polyps were removed were tubular adenomas but without high-grade dysplasia or malignancy.  Will check TSH to make sure that hypothyroidism is not playing a well and her recurrent issues with constipation but also prescribed MiraLAX for daily or every other day use as needed to help with constipation. 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  Urinalysis was done to rule out presence of urinary tract infection or other abnormalities.  Patient will be started on Detrol LA 4 mg once daily to help with her bladder leakage.  Patient is aware that the medication may cause dry mouth.  Patient should call or return if she does not believe that the medication is effective or if she is having side effects from use of the medication - POCT URINALYSIS DIP (CLINITEK) - tolterodine (DETROL LA) 4 MG 24 hr capsule; Take 1 capsule (4 mg total) by mouth daily. To help with bladder leakage  Dispense: 30 capsule; Refill: 6  6. Encounter for long-term (current) use of medications Patient will have CMP and follow-up of long-term use of high-risk medications including statin medication as well as medication for treatment of  hypertension - Comprehensive metabolic panel  7. Gastroesophageal reflux disease, esophagitis presence not specified Patient with complaints of reflux symptoms if she is not taking medication for control of the symptoms.  Patient is to take omeprazole 40 mg daily but should call or return if her reflux symptoms continue with use of medication.  Patient should avoid eating and avoid known trigger foods. - omeprazole (PRILOSEC) 40 MG capsule; Take 1 capsule (40 mg total) by mouth daily.  Dispense: 90 capsule; Refill: 3  8. Rash of genital area Discussed with the patient that the rash that she has in the  genital area appears to be skin irritation secondary to shaving.  Prescription provided for Lotrisone cream to use as needed and patient should make sure that she changes razors often and use shaving cream to help moisturize the skin area.  Patient may need to switch to alternate method of hair removal if continued skin irritation. - clotrimazole-betamethasone (LOTRISONE) cream; Apply 1 application topically 2 (two) times daily. As needed for skin irritation  Dispense: 30 g; Refill: 2  9. Abnormal urinalysis Patient with trace blood on urinalysis and urine will be sent.  Patient will be notified if further treatment is needed based on the results.  Patient will also need repeat urinalysis at follow-up to make sure that hematuria has resolved.  If patient with continued hematuria, patient may require urology referral for further evaluation and treatment. - Urine Culture  *Influenza immunization was offered but declined by patient at today's visit  An After Visit Summary was printed and given to the patient.  Return in about 4 months (around 11/17/2018).

## 2018-07-18 NOTE — Patient Instructions (Signed)
Incontinencia urinaria (Urinary Incontinence) La incontinencia urinaria es la prdida involuntaria de orina de la vejiga. CAUSAS Hay muchas causas de incontinencia urinaria. Ellas son:  Medicamentos.  Infecciones.  Agrandamiento prosttico que causa un aumento del flujo de [place] de la vejiga.  Ciruga.  Enfermedades neurolgicas.  Factores emocionales. SIGNOS Y SNTOMAS Hay cuatro tipos de incontinencia urinaria: 1. Incontinencia urinaria de urgencia: es la prdida involuntaria de orina antes de llegar al bao. Hay una urgencia repentina de orinar pero no llega a tiempo al bao. 2. Incontinencia por estrs: es la prdida repentina de orina con cualquier actividad que fuerza a Art gallery manager orina. La causa ms frecuente son los cambios anatmicos en la pelvis y las zonas del esfnter. 3. Incontinencia por rebosamiento: es la prdida de orina por una obstruccin de la apertura de la vejiga. Esto causa un retroceso de la orina y una acumulacin de presin dentro de la vejiga. Cuando la presin dentro de la vejiga excede la presin que Engineer, manufacturing systems, la orina rebalsa y causa incontinencia, similar al desbordamiento de un dique. 4. Incontinencia total: es la prdida de orina como resultado de la incapacidad de Financial controller orina dentro de la vejiga. DIAGNSTICO La evaluacin de la causa puede requerir:  Ardelia Mems historia mdica y obsttrica exhaustiva y [place].  Examen fsico completo.  Anlisis de laboratorio como un urocultivo y [place]. Cuando se indican estudios adicionales, pueden incluirse:  Una ecografa.  Radiografas de vejiga y riones.  Una cistoscopa. Consiste en un examen de la vejiga utilizando un telescopio pequeo.  Estudios urodinmicos para comprobar la funcin nerviosa de la vejiga y Social worker. TRATAMIENTO El tratamiento de la incontinencia urinaria depende de la causa:  Para la incontinencia urinaria causada por una infeccin urinaria, le indicarn  antibiticos. Si la incontinencia urinaria se relaciona con los [employer] toma, el mdico podr Public relations account executive.  Para la incontinencia por estrs, en necesaria una ciruga para restablecer el soporte anatmico de la vejiga o el esfnter, o ambos, lo que Clinical research associate.  Para la incontinencia por rebosamiento causada por una prstata agrandada, una operacin para abrir el canal a travs de la prstata agrandada permitir que el flujo de orina salga de la vejiga. En las mujeres con fibromas, ser necesaria una histerectoma.  Para la incontinencia total, una ciruga del esfnter podr ayudar. Puede ser necesario colocar un esfnter urinario artificial (se coloca un manguito inflable alrededor de Geologist, engineering). En las mujeres que hayan desarrollado un pasaje similar a un orificio entre la vejiga y la vagina (fstula vesicovaginal ), ser necesario realizar una ciruga para cerrar la fstula. INSTRUCCIONES PARA EL CUIDADO EN EL HOGAR  La higiene diaria normal y el uso regular de apsitos o paales para adultos cambiados con regularidad ayudan a prevenir olores y daos en la piel.  Evite la cafena. Puede sobreestimular la vejiga.  Use el bao con regularidad. Trate de ir al bao cada 2  3 horas, aunque no sienta la necesidad. Tmese el tiempo para vaciar la vejiga completamente. Despus de orinar, espere un minuto. Luego trate de orinar nuevamente.  Para las causas que implican una disfuncin nerviosa, lleve un registro de los medicamentos que toma y un diario de las veces que va al bao. SOLICITE ATENCIN MDICA SI:  Despus del procedimiento, experimenta un empeoramiento del dolor en vez de mejorar.  La incontinencia empeora en vez de mejorar. SOLICITE ATENCIN MDICA DE INMEDIATO SI:  Le sube la fiebre o tiene escalofros.  No puede orinar.  Observa irritacin en la ingle o baja hacia los muslos. ASEGRESE DE QUE:  Comprende estas instrucciones.  Controlar su  afeccin.  Recibir ayuda de inmediato si no mejora o si empeora. Esta informacin no tiene Marine scientist el consejo del mdico. Asegrese de hacerle al mdico cualquier pregunta que tenga. Document Released: 10/17/2005 Document Revised: 11/07/2014 Document Reviewed: 03/26/2013 Elsevier Interactive Patient Education  [employer].

## 2018-07-18 NOTE — Progress Notes (Signed)
aptient complained of exterior vaginal spots, like rash been present for 4/5 months.  Patient complained of lower back pain. Rated 8.  Patient declined flu shot [facility]

## 2018-07-19 LAB — COMPREHENSIVE METABOLIC PANEL WITH GFR
ALT: 23 IU/L (ref 0–32)
AST: 18 IU/L (ref 0–40)
Albumin/Globulin Ratio: 1.8 (ref 1.2–2.2)
Albumin: 4.6 g/dL (ref 3.5–5.5)
Alkaline Phosphatase: 116 IU/L (ref 39–117)
BUN/Creatinine Ratio: 13 (ref 9–23)
BUN: 8 mg/dL (ref 6–24)
Bilirubin Total: 0.6 mg/dL (ref 0.0–1.2)
CO2: 24 mmol/L (ref 20–29)
Calcium: 9.2 mg/dL (ref 8.7–10.2)
Chloride: 105 mmol/L (ref 96–106)
Creatinine, Ser: 0.64 mg/dL (ref 0.57–1.00)
GFR calc Af Amer: 115 mL/min/1.73
GFR calc non Af Amer: 99 mL/min/1.73
Globulin, Total: 2.6 g/dL (ref 1.5–4.5)
Glucose: 110 mg/dL — ABNORMAL HIGH (ref 65–99)
Potassium: 4 mmol/L (ref 3.5–5.2)
Sodium: 144 mmol/L (ref 134–144)
Total Protein: 7.2 g/dL (ref 6.0–8.5)

## 2018-07-19 LAB — TSH: TSH: 1.09 u[IU]/mL (ref 0.450–4.500)

## 2018-07-24 ENCOUNTER — Telehealth: Payer: Self-pay

## 2018-07-24 NOTE — Telephone Encounter (Signed)
[employer] Quillian Quince  Id#  [id number] contacted pt to go over lab results pt didn't answer left a detailed vm informing pt of results and if she has any questions or concerns to give me a call   If pt calls back please give results: blood sugar was 110 and your kindney and liver function test is normal.Thyroid test is normal

## 2018-08-22 ENCOUNTER — Other Ambulatory Visit: Payer: Self-pay | Admitting: Family Medicine

## 2018-08-22 DIAGNOSIS — G8929 Other chronic pain: Secondary | ICD-10-CM

## 2018-08-22 DIAGNOSIS — M546 Pain in thoracic spine: Principal | ICD-10-CM

## 2018-08-22 MED FILL — OMEPRAZOLE DR 40 MG CAPSULE: 40 | 30 days supply | Qty: 30 | Fill #0

## 2018-08-22 MED FILL — CLOTRIMAZOLE-BETAMETHASONE: 1-0.05 | 15 days supply | Qty: 30 | Fill #1

## 2018-08-22 MED FILL — GABAPENTIN 100 MG CAPSULE: 100 | 30 days supply | Qty: 60 | Fill #1

## 2018-08-22 MED FILL — MELOXICAM 7.5 MG TABLET: 7.5 | 30 days supply | Qty: 30 | Fill #1

## 2018-08-22 MED FILL — LISINOPRIL 10 MG TABS: 10 | 30 days supply | Qty: 30 | Fill #0

## 2018-08-22 MED FILL — ?ATORVASTATIN 20 MG TABLET: 20 | 30 days supply | Qty: 30 | Fill #0

## 2018-09-24 MED FILL — OMEPRAZOLE DR 40 MG CAPSULE: 40 | 30 days supply | Qty: 30 | Fill #1

## 2018-09-24 MED FILL — LISINOPRIL 10 MG TABS: 10 | 30 days supply | Qty: 30 | Fill #1

## 2018-09-24 MED FILL — ?ATORVASTATIN 20 MG TABLET: 20 | 30 days supply | Qty: 30 | Fill #1

## 2018-11-02 MED FILL — ?ATORVASTATIN 20 MG TABLET: 20 | 30 days supply | Qty: 30 | Fill #2

## 2018-11-02 MED FILL — LISINOPRIL 10 MG TABS: 10 | 30 days supply | Qty: 30 | Fill #2

## 2018-11-02 MED FILL — OMEPRAZOLE DR 40 MG CAPSULE: 40 | 30 days supply | Qty: 30 | Fill #2

## 2018-11-14 ENCOUNTER — Ambulatory Visit: Payer: Self-pay | Attending: Family Medicine

## 2018-11-19 ENCOUNTER — Ambulatory Visit: Payer: Self-pay | Admitting: Family Medicine

## 2018-12-03 ENCOUNTER — Ambulatory Visit (HOSPITAL_COMMUNITY)
Admission: EM | Admit: 2018-12-03 | Discharge: 2018-12-03 | Disposition: A | Discharge status: Home / Self Care | Payer: Self-pay | Attending: Family Medicine | Admitting: Family Medicine

## 2018-12-03 ENCOUNTER — Encounter (HOSPITAL_COMMUNITY): Payer: Self-pay

## 2018-12-03 ENCOUNTER — Other Ambulatory Visit: Payer: Self-pay

## 2018-12-03 DIAGNOSIS — K921 Melena: Secondary | ICD-10-CM

## 2018-12-03 NOTE — Discharge Instructions (Addendum)
Try senna as stool softener.  Alternative may be Colace

## 2018-12-03 NOTE — ED Triage Notes (Signed)
Pt cc she states she has been seeing bright red blood in her stool . Pt is have pain on her right side. Pt states she feels bloated x 3 days.

## 2018-12-03 NOTE — ED Provider Notes (Signed)
[place]    CSN: [id number] Arrival date & time: 12/03/18  1030     History   Chief Complaint Chief Complaint  Patient presents with  . Blood In Stools    HPI Susan Lopez is a 58 y.o. female.   Patient has seen blood in stools past week.  Only happened one time.  She had colonoscopy 6 months ago and 2 polyps were removed.  No mention of diverticular disease.  She has had some left lower quadrant pain.  She denies constipation but she does take MiraLAX on an as-needed basis.  There is a history of internal hemorrhoid in her medical record  HPI  Past Medical History:  Diagnosis Date  . Allergy   . Anxiety   . Arthritis   . Bone lesion 10/04/2013  . Calculus of gallbladder with acute and chronic cholecystitis without obstruction, s/p lap chole 27Dec2012 09/19/2011  . Cataract 2009   bilateral  . Complication of anesthesia   . Depression   . Gallstones   . GERD (gastroesophageal reflux disease)   . Hemorrhoids, external 10-21-11   occ. bothersome  . Hyperlipidemia   . Hypertension   . Iron deficiency anemia   . Mammogram abnormal 10/04/2013  . PONV (postoperative nausea and vomiting)   . Sleep apnea     Patient Active Problem List   Diagnosis Date Noted  . Snoring 07/05/2017  . OSA (obstructive sleep apnea) 07/05/2017  . 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 Left eye injury 11/23/2016  . Urinary incontinence in female 05/19/2016  . Bimalleolar fracture of left ankle 11/13/2015  . S/P ORIF (open reduction internal fixation) fracture 11/13/2015  . Healthcare maintenance 10/08/2015  . Postmenopausal bleeding 10/08/2015  . Pain in joint, ankle and foot 10/08/2015  . Headache disorder 10/08/2015  . Atypical chest pain 10/08/2015  . Essential hypertension 10/08/2015  . Neck pain on right side 08/12/2015  . Grief reaction 03/09/2015  . Chronic thoracic spine pain 12/11/2014  . Depression (emotion) 08/12/2014  . Dyslipidemia 08/12/2014    . Headache(784.0) 07/17/2014  . UTI (urinary tract infection) 04/16/2014  . Cystocele with rectocele 04/16/2014  . Hyperlipidemia LDL goal < 100 01/22/2014  . Breast lump on left side at 12 o'clock position 10/22/2013  . Chronic constipation 10/09/2013  . Bone lesion 10/04/2013  . Mammogram abnormal 10/04/2013  . Abdominal pain, unspecified site 10/04/2013  . Unspecified essential hypertension 06/10/2013  . Chest pain, unspecified 06/10/2013  . Chronic throat pain 06/10/2013  . Back pain 11/14/2011  . Obesity (BMI 30-39.9) 11/14/2011  . GERD (gastroesophageal reflux disease) 09/19/2011  . Calculus of gallbladder with acute and chronic cholecystitis without obstruction, s/p lap chole 27Dec2012 09/19/2011  . Hemorrhoids, internal, with bleeding 09/19/2011  . ANEMIA-IRON DEFICIENCY 03/19/2009  . CHEST PAIN, ATYPICAL 03/19/2009    Past Surgical History:  Procedure Laterality Date  . [place]  . CHOLECYSTECTOMY  10/27/2011   Procedure: LAPAROSCOPIC CHOLECYSTECTOMY WITH INTRAOPERATIVE CHOLANGIOGRAM;  Surgeon: Adin Hector, MD;  [facility];  Service: General;  Laterality: N/A;  Laparoscopic Chole w/ IOC Single Site  . EYE SURGERY  1`12-21-10   bil. for tissue growth-laser surgery  . HARDWARE REMOVAL Left 03/16/2016   Procedure: LEFT ANKLE HARDWARE REMOVAL;  Surgeon: Leandrew Koyanagi, MD;  Location: [facility];  Service: Orthopedics;  Laterality: Left;  Marland Kitchen MM BREAST STEREO BIOPSY LEFT ([place] HX) Left 2015  . ORIF ANKLE FRACTURE Left 11/13/2015  Procedure: OPEN REDUCTION INTERNAL FIXATION (ORIF) LEFT BIMALLEOLAR ANKLE FRACTURE;  Surgeon: Leandrew Koyanagi, MD;  Location: [facility];  Service: Orthopedics;  Laterality: Left;    OB History    Gravida  4   Para  3   Term  3   Preterm  0   AB  1   Living  2     SAB  1   TAB  0   Ectopic  0   Multiple  1   Live Births               Home Medications    Prior to Admission medications    Medication Sig Start Date End Date Taking? Authorizing Provider  aspirin EC 325 MG tablet Take 1 tablet (325 mg total) by mouth 2 (two) times daily. 11/13/15   Leandrew Koyanagi, MD  atorvastatin (LIPITOR) 20 MG tablet Take 1 tablet (20 mg total) by mouth daily. 07/18/18   Fulp, Cammie, MD  clotrimazole-betamethasone (LOTRISONE) cream Apply 1 application topically 2 (two) times daily. As needed for skin irritation 07/18/18   Fulp, Cammie, MD  cyclobenzaprine (FLEXERIL) 10 MG tablet TAKE 1 TABLET BY MOUTH AT BEDTIME AS NEEDED FOR MUSCLE SPASMS. 08/22/18   Fulp, Cammie, MD  gabapentin (NEURONTIN) 100 MG capsule Take 1 capsule (100 mg total) by mouth 2 (two) times daily. 07/18/18   Fulp, Cammie, MD  lisinopril (PRINIVIL,ZESTRIL) 10 MG tablet Take 1 tablet (10 mg total) by mouth daily. 07/18/18   Fulp, Cammie, MD  meloxicam (MOBIC) 7.5 MG tablet Take 1 tablet (7.5 mg total) by mouth daily. 07/18/18   Fulp, Cammie, MD  Multiple Vitamin (MULTIVITAMIN WITH MINERALS) TABS tablet Take 1 tablet by mouth daily.    [provider]  omeprazole (PRILOSEC) 40 MG capsule Take 1 capsule (40 mg total) by mouth daily. 07/18/18   Fulp, Cammie, MD  polyethylene glycol powder (GLYCOLAX/MIRALAX) powder Take 17 g by mouth daily. As needed for constipation relief 07/18/18   Fulp, Ander Gaster, MD  Probiotic Product (PROBIOTIC PO) Take by mouth daily.    [provider]  tolterodine (DETROL LA) 4 MG 24 hr capsule Take 1 capsule (4 mg total) by mouth daily. To help with bladder leakage 07/18/18   Antony Blackbird, MD    Family History Family History  Problem Relation Age of Onset  . Hypertension Mother   . Cancer Mother   . Hypertension Father   . Colon cancer Neg Hx     Social History Social History   Tobacco Use  . Smoking status: Former Smoker    Packs/day: 0.25    Years: 10.00    Pack years: 2.50    Last attempt to quit: 10/20/2004    Years since quitting: 14.1  . Smokeless tobacco: Never Used  Substance  Use Topics  . Alcohol use: No  . Drug use: No     Allergies   Hydrocodone; Tramadol; and Oxycodone   Review of Systems Review of Systems  Gastrointestinal: Positive for abdominal pain and blood in stool.  All other systems reviewed and are negative.    Physical Exam Triage Vital Signs ED Triage Vitals [12/03/18 1146]  Enc Vitals Group     BP 133/84     Pulse Rate 86     Resp 18     Temp 98 F (36.7 C)     Temp Source Oral     SpO2 100 %     Weight  Height      Head Circumference      Peak Flow      Pain Score 8     Pain Loc      Pain Edu?      Excl. in [place]?    No data found.  Updated Vital Signs BP 133/84 (BP Location: Right Arm)   Pulse 86   Temp 98 F (36.7 C) (Oral)   Resp 18   LMP 10/08/2015   SpO2 100%   Visual Acuity Right Eye Distance:   Left Eye Distance:   Bilateral Distance:    Right Eye Near:   Left Eye Near:    Bilateral Near:     Physical Exam Constitutional:      Appearance: Normal appearance. She is obese.  Abdominal:     General: Bowel sounds are normal.     Tenderness: There is no abdominal tenderness. There is no rebound.     Comments: Would have preferred to do anoscopic exam but scope is not available.  Neurological:     Mental Status: She is alert.      UC Treatments / Results  Labs (all labs ordered are listed, but only abnormal results are displayed) Labs Reviewed - No data to display  EKG None  Radiology No results found.  Procedures Procedures (including critical care time)  Medications Ordered in UC Medications - No data to display  Initial Impression / Assessment and Plan / UC Course  I have reviewed the triage vital signs and the nursing notes.  Pertinent labs & imaging results that were available during my care of the patient were reviewed by me and considered in my medical decision making (see chart for details).     Blood in stool likely related to hemorrhoidal bleeding.  Will suggest stool  softener rather than MiraLAX Final Clinical Impressions(s) / UC Diagnoses   Final diagnoses:  None   Discharge Instructions   None    ED Prescriptions    None     Controlled Substance Prescriptions [place] Controlled Substance Registry consulted? No   Wardell Honour, MD 12/03/18 1226

## 2018-12-17 MED FILL — LISINOPRIL 10 MG TABS: 10 | 30 days supply | Qty: 30 | Fill #3

## 2018-12-19 MED FILL — ?ATORVASTATIN 20 MG TABLET: 20 | 30 days supply | Qty: 30 | Fill #3

## 2018-12-19 MED FILL — OMEPRAZOLE DR 40 MG CAPSULE: 40 | 30 days supply | Qty: 30 | Fill #3

## 2019-01-17 MED FILL — ?ATORVASTATIN 20 MG TABLET: 20 | 90 days supply | Qty: 90 | Fill #4

## 2019-01-17 MED FILL — OMEPRAZOLE DR 40 MG CAPSULE: 40 | 90 days supply | Qty: 90 | Fill #4

## 2019-01-17 MED FILL — LISINOPRIL 10 MG TABS: 10 | 90 days supply | Qty: 90 | Fill #4

## 2019-06-07 MED FILL — GABAPENTIN 100 MG CAP: 100 | 30 days supply | Qty: 60 | Fill #2

## 2019-06-07 MED FILL — OMEPRAZOLE DR 40 MG CAPSULE: 40 | 90 days supply | Qty: 90 | Fill #5

## 2019-06-07 MED FILL — LISINOPRIL 10 MG TABS: 10 | 90 days supply | Qty: 90 | Fill #5

## 2019-06-07 MED FILL — ?ATORVASTATIN 20 MG TABLET: 20 | 90 days supply | Qty: 90 | Fill #5

## 2019-07-10 ENCOUNTER — Ambulatory Visit: Payer: Self-pay

## 2019-07-16 ENCOUNTER — Telehealth: Payer: Self-pay | Admitting: Family Medicine

## 2019-07-16 ENCOUNTER — Other Ambulatory Visit: Payer: Self-pay

## 2019-07-16 ENCOUNTER — Ambulatory Visit: Payer: Self-pay | Attending: Family Medicine

## 2019-07-16 NOTE — Telephone Encounter (Signed)
I call Pt to remind that she has a 11;30 tele visit with financial, LVM

## 2019-08-08 ENCOUNTER — Other Ambulatory Visit: Payer: Self-pay

## 2019-08-08 ENCOUNTER — Ambulatory Visit: Payer: Self-pay | Admitting: Family Medicine

## 2019-08-23 ENCOUNTER — Encounter: Payer: Self-pay | Admitting: Family Medicine

## 2019-08-23 ENCOUNTER — Other Ambulatory Visit: Payer: Self-pay

## 2019-08-23 ENCOUNTER — Ambulatory Visit: Payer: Self-pay | Attending: Family Medicine | Admitting: Family Medicine

## 2019-08-23 DIAGNOSIS — R102 Pelvic and perineal pain unspecified side: Secondary | ICD-10-CM

## 2019-08-23 DIAGNOSIS — N898 Other specified noninflammatory disorders of vagina: Secondary | ICD-10-CM

## 2019-08-23 MED ORDER — AMOXICILLIN-POT CLAVULANATE 500-125 MG PO TABS: 1.0000 | ORAL_TABLET | Freq: Two times a day (BID) | ORAL | 0 refills | Status: DC

## 2019-08-23 NOTE — Progress Notes (Signed)
4 months f/u   Med refills  4-5 days ago she saw a bump on her vaginal region

## 2019-08-23 NOTE — Progress Notes (Signed)
Virtual Visit via Telephone Note  I connected with  on 08/23/19 at  2:50 PM EDT by telephone and verified that I am speaking with the correct person using two identifiers.   I discussed the limitations, risks, security and privacy concerns of performing an evaluation and management service by telephone and the availability of in person appointments. I also discussed with the patient that there may be a patient responsible charge related to this service. The patient expressed understanding and agreed to proceed.  Patient Location: Home Provider Location: [facility] Office Others participating in call: Call initiated by Emilio Aspen, RMA who obtained Spanish speaking interpreter through [facility] interpretation services due to a language barrier   History of Present Illness:      58 year old female with medical history significant for hypertension, who reports that in the past 4 to 5 days she noticed a " cyst" or nodule in her vaginal area.  The area is slightly tender and has gotten slightly more raised.  She also feels as if she is having some abdominal discomfort similar to prior discomfort when she had an ovarian cyst in the past.  She denies any other abdominal pain-no nausea/vomiting/diarrhea or constipation.  No blood in the stool or black stools.  She denies any urinary frequency, urgency or dysuria.  She reports having dull/crampy sensation in bilateral lower abdomen.  She denies any postmenopausal bleeding.       Past Medical History:  Diagnosis Date  . Allergy   . Anxiety   . Arthritis   . Bone lesion 10/04/2013  . Calculus of gallbladder with acute and chronic cholecystitis without obstruction, s/p lap chole 27Dec2012 09/19/2011  . Cataract 2009   bilateral  . Complication of anesthesia   . Depression   . Gallstones   . GERD (gastroesophageal reflux disease)   . Hemorrhoids, external 10-21-11   occ. bothersome  . Hyperlipidemia   . Hypertension   . Iron deficiency anemia   .  Mammogram abnormal 10/04/2013  . PONV (postoperative nausea and vomiting)   . Sleep apnea     Past Surgical History:  Procedure Laterality Date  . [place]  . CHOLECYSTECTOMY  10/27/2011   Procedure: LAPAROSCOPIC CHOLECYSTECTOMY WITH INTRAOPERATIVE CHOLANGIOGRAM;  Surgeon: Adin Hector, MD;  [facility];  Service: General;  Laterality: N/A;  Laparoscopic Chole w/ IOC Single Site  . EYE SURGERY  1`12-21-10   bil. for tissue growth-laser surgery  . HARDWARE REMOVAL Left 03/16/2016   Procedure: LEFT ANKLE HARDWARE REMOVAL;  Surgeon: Leandrew Koyanagi, MD;  Location: [facility];  Service: Orthopedics;  Laterality: Left;  Marland Kitchen MM BREAST STEREO BIOPSY LEFT ([place] HX) Left 2015  . ORIF ANKLE FRACTURE Left 11/13/2015   Procedure: OPEN REDUCTION INTERNAL FIXATION (ORIF) LEFT BIMALLEOLAR ANKLE FRACTURE;  Surgeon: Leandrew Koyanagi, MD;  Location: [facility];  Service: Orthopedics;  Laterality: Left;    Family History  Problem Relation Age of Onset  . Hypertension Mother   . Cancer Mother   . Hypertension Father   . Colon cancer Neg Hx     Social History   Tobacco Use  . Smoking status: Former Smoker    Packs/day: 0.25    Years: 10.00    Pack years: 2.50    Quit date: 10/20/2004    Years since quitting: 14.8  . Smokeless tobacco: Never Used  Substance Use Topics  . Alcohol use: No  . Drug use: No     Allergies  Allergen Reactions  . Hydrocodone Nausea And Vomiting  . Tramadol Nausea Only  . Oxycodone Rash       Observations/Objective: No vital signs or physical exam conducted as visit was done via telephone  Assessment and Plan: 1. Adnexal pain Patient with complaint of adnexal pain.  She has history of cystocele and rectocele on review of chart.  She reports that she has had prior issues with ovarian cysts.  She denies any postmenopausal bleeding.  She has been referred to gynecology for further evaluation and treatment. - Ambulatory referral to  Gynecology  2. Cyst of vagina Patient with complaint of a cyst in the vaginal area.  Per her description it sounds as if she may have a sebaceous cyst or small abscess.  She will be placed on Augmentin 500 mg twice daily x10 days.  Discussed with the patient that it is difficult to make a diagnosis of her issues by telemedicine visit and I would like for her to come into the office next week if the cyst/nodule is still present. - Ambulatory referral to Gynecology - amoxicillin-clavulanate (AUGMENTIN) 500-125 MG tablet; Take 1 tablet (500 mg total) by mouth 2 (two) times daily. Take after eating  Dispense: 20 tablet; Refill: 0  Follow Up Instructions:Return in about 1 week (around 08/30/2019) for cyst.    I discussed the assessment and treatment plan with the patient. The patient was provided an opportunity to ask questions and all were answered. The patient agreed with the plan and demonstrated an understanding of the instructions.   The patient was advised to call back or seek an in-person evaluation if the symptoms worsen or if the condition fails to improve as anticipated.  I provided 16 minutes of non-face-to-face time during this encounter.   Antony Blackbird, MD

## 2019-08-26 MED FILL — AMOX-CLAV 500-125 MG TABLET: 500-125 | 10 days supply | Qty: 20 | Fill #0

## 2019-09-09 ENCOUNTER — Other Ambulatory Visit: Payer: Self-pay | Admitting: Family Medicine

## 2019-09-09 DIAGNOSIS — I10 Essential (primary) hypertension: Secondary | ICD-10-CM

## 2019-09-09 DIAGNOSIS — M546 Pain in thoracic spine: Secondary | ICD-10-CM

## 2019-09-09 DIAGNOSIS — G8929 Other chronic pain: Secondary | ICD-10-CM

## 2019-09-09 DIAGNOSIS — K219 Gastro-esophageal reflux disease without esophagitis: Secondary | ICD-10-CM

## 2019-09-09 DIAGNOSIS — E785 Hyperlipidemia, unspecified: Secondary | ICD-10-CM

## 2019-09-10 MED FILL — OMEPRAZOLE DR 40 MG CAPSULE: 40 | 30 days supply | Qty: 30 | Fill #0

## 2019-09-10 MED FILL — LISINOPRIL 10 MG TABS: 10 | 30 days supply | Qty: 30 | Fill #0

## 2019-09-10 MED FILL — ?ATORVASTATIN 20 MG TABLET: 20 | 30 days supply | Qty: 30 | Fill #0

## 2019-09-10 MED FILL — MELOXICAM 7.5 MG TABLET: 7.5 | 30 days supply | Qty: 30 | Fill #0

## 2019-09-10 MED FILL — GABAPENTIN 100 MG CAP: 100 | 30 days supply | Qty: 60 | Fill #0

## 2019-09-29 ENCOUNTER — Other Ambulatory Visit: Payer: Self-pay | Admitting: Family Medicine

## 2019-09-29 DIAGNOSIS — K219 Gastro-esophageal reflux disease without esophagitis: Secondary | ICD-10-CM

## 2019-09-29 DIAGNOSIS — E785 Hyperlipidemia, unspecified: Secondary | ICD-10-CM

## 2019-09-29 DIAGNOSIS — I10 Essential (primary) hypertension: Secondary | ICD-10-CM

## 2019-09-29 MED ORDER — ATORVASTATIN CALCIUM 20 MG PO TABS: 20.0000 mg | ORAL_TABLET | Freq: Every day | ORAL | 2 refills | Status: DC

## 2019-09-29 MED ORDER — OMEPRAZOLE 40 MG PO CPDR: 40.0000 mg | DELAYED_RELEASE_CAPSULE | Freq: Every day | ORAL | 2 refills | Status: DC

## 2019-09-29 MED ORDER — LISINOPRIL 10 MG PO TABS: 10.0000 mg | ORAL_TABLET | Freq: Every day | ORAL | 2 refills | Status: DC

## 2019-09-29 NOTE — Progress Notes (Signed)
Patient ID: Susan Lopez, female   DOB: Aug 26, 1961, 58 y.o.   MRN: [id number]   Refill sent to patient's pharmacy for lisinopril, atorvastatin and omeprazole

## 2019-10-09 ENCOUNTER — Other Ambulatory Visit: Payer: Self-pay

## 2019-10-09 ENCOUNTER — Ambulatory Visit (INDEPENDENT_AMBULATORY_CARE_PROVIDER_SITE_OTHER): Payer: Self-pay | Admitting: Obstetrics & Gynecology

## 2019-10-09 ENCOUNTER — Encounter: Payer: Self-pay | Admitting: Obstetrics & Gynecology

## 2019-10-09 VITALS — BP 116/78 | HR 80 | Wt 172.3 lb

## 2019-10-09 DIAGNOSIS — R102 Pelvic and perineal pain: Secondary | ICD-10-CM

## 2019-10-09 DIAGNOSIS — N764 Abscess of vulva: Secondary | ICD-10-CM

## 2019-10-09 NOTE — Patient Instructions (Signed)
Return to clinic for any scheduled appointments or for any gynecologic concerns as needed.   

## 2019-10-09 NOTE — Progress Notes (Signed)
GYNECOLOGY OFFICE VISIT NOTE  History:   Susan Lopez is a 58 y.o. [id number] here today for evaluation of lower abdominal pain.  Patient is Spanish-speaking only, Spanish interpreter present for this encounter.  Patient thinks her pain is ovarian, wants ultrasound. Pain has bene going on for months.  Hurts on left side the most.  No nausea, abnormal vaginal discharge, bleeding. Also wants me to look at a bump on her vulva, has been there for days.  Was evaluated by her PCP, was told it was ingrown hair.     Past Medical History:  Diagnosis Date  . Allergy   . Anxiety   . Arthritis   . Bone lesion 10/04/2013  . Calculus of gallbladder with acute and chronic cholecystitis without obstruction, s/p lap chole 27Dec2012 09/19/2011  . Cataract 2009   bilateral  . Complication of anesthesia   . Depression   . Gallstones   . GERD (gastroesophageal reflux disease)   . Hemorrhoids, external 10-21-11   occ. bothersome  . Hyperlipidemia   . Hypertension   . Iron deficiency anemia   . Mammogram abnormal 10/04/2013  . PONV (postoperative nausea and vomiting)   . Sleep apnea     Past Surgical History:  Procedure Laterality Date  . [place]  . CHOLECYSTECTOMY  10/27/2011   Procedure: LAPAROSCOPIC CHOLECYSTECTOMY WITH INTRAOPERATIVE CHOLANGIOGRAM;  Surgeon: Adin Hector, MD;  [facility];  Service: General;  Laterality: N/A;  Laparoscopic Chole w/ IOC Single Site  . EYE SURGERY  1`12-21-10   bil. for tissue growth-laser surgery  . HARDWARE REMOVAL Left 03/16/2016   Procedure: LEFT ANKLE HARDWARE REMOVAL;  Surgeon: Leandrew Koyanagi, MD;  Location: [facility];  Service: Orthopedics;  Laterality: Left;  Marland Kitchen MM BREAST STEREO BIOPSY LEFT ([place] HX) Left 2015  . ORIF ANKLE FRACTURE Left 11/13/2015   Procedure: OPEN REDUCTION INTERNAL FIXATION (ORIF) LEFT BIMALLEOLAR ANKLE FRACTURE;  Surgeon: Leandrew Koyanagi, MD;  Location: [facility];  Service: Orthopedics;   Laterality: Left;    The following portions of the patient's history were reviewed and updated as appropriate: allergies, current medications, past family history, past medical history, past social history, past surgical history and problem list.   Health Maintenance:  Normal pap and mammogram in 2018 as per patient.  Review of Systems:  Pertinent items noted in HPI and remainder of comprehensive ROS otherwise negative.  Physical Exam:  BP 116/78   Pulse 80   Wt 172 lb 4.8 oz (78.2 kg)   LMP 10/08/2015   BMI 34.80 kg/m  CONSTITUTIONAL: Well-developed, well-nourished female in no acute distress.  HEENT:  Normocephalic, atraumatic. External right and left ear normal. No scleral icterus.  NECK: Normal range of motion, supple, no masses noted on observation SKIN: No rash noted. Not diaphoretic. No erythema. No pallor. MUSCULOSKELETAL: Normal range of motion. No edema noted. NEUROLOGIC: Alert and oriented to person, place, and time. Normal muscle tone coordination. No cranial nerve deficit noted. PSYCHIATRIC: Normal mood and affect. Normal behavior. Normal judgment and thought content. CARDIOVASCULAR: Normal heart rate noted RESPIRATORY: Effort and breath sounds normal, no problems with respiration noted ABDOMEN: No masses noted. No other overt distention noted.   PELVIC: Normal appearing external genitalia with healed tiny furuncle on right labium majus.  No abnormal discharge noted.      Assessment and Plan:    1. Pelvic pain in female Will check ultrasound and check for UTI, will follow up results and  manage accordingly. - [name] GYN Pelvis Complete with Transvaginal; Future - Urine Culture-GYN  2. Vulvar furuncle Patient reassured. Advised to stop shaving that area.    Routine preventative health maintenance measures emphasized. Please refer to After Visit Summary for other counseling recommendations.   Return for any gynecologic concerns.    Total face-to-face time with  patient: 20 minutes.  Over 50% of encounter was spent on counseling and coordination of care.   Verita Schneiders, MD, [place] for [employer], [place]

## 2019-10-17 MED FILL — ?ATORVASTATIN 20 MG TABLET: 20 | 30 days supply | Qty: 30 | Fill #1

## 2019-10-17 MED FILL — GABAPENTIN 100 MG CAP: 100 | 30 days supply | Qty: 60 | Fill #1

## 2019-10-17 MED FILL — LISINOPRIL 10 MG TABS: 10 | 30 days supply | Qty: 30 | Fill #1

## 2019-10-17 MED FILL — OMEPRAZOLE DR 40 MG CAPSULE: 40 | 30 days supply | Qty: 30 | Fill #1

## 2019-10-17 MED FILL — MELOXICAM 7.5 MG TABLET: 7.5 | 30 days supply | Qty: 30 | Fill #1

## 2019-10-18 ENCOUNTER — Other Ambulatory Visit: Payer: Self-pay

## 2019-10-18 ENCOUNTER — Other Ambulatory Visit: Payer: Self-pay | Admitting: Family Medicine

## 2019-10-18 ENCOUNTER — Telehealth: Payer: Self-pay | Admitting: Family Medicine

## 2019-10-18 ENCOUNTER — Ambulatory Visit (HOSPITAL_COMMUNITY)
Admission: RE | Admit: 2019-10-18 | Discharge: 2019-10-18 | Disposition: A | Discharge status: Home / Self Care | Payer: Self-pay | Source: Ambulatory Visit | Attending: Obstetrics & Gynecology | Admitting: Obstetrics & Gynecology

## 2019-10-18 DIAGNOSIS — Z1231 Encounter for screening mammogram for malignant neoplasm of breast: Secondary | ICD-10-CM

## 2019-10-18 DIAGNOSIS — R102 Pelvic and perineal pain: Secondary | ICD-10-CM | POA: Insufficient documentation

## 2019-10-18 NOTE — Telephone Encounter (Signed)
Patient called stating that on her last OV her PCP said she was going to be referred out to have a mammogram done. Patient states she has not received a call. Please f/u

## 2019-10-18 NOTE — Progress Notes (Signed)
Patient ID: Susan Lopez, female   DOB: 27-Oct-1961, 58 y.o.   MRN: [id number]   58 year old female who reports that she has not been contacted regarding screening mammogram.  Will place new order and will have medical assistant contact patient and offer scholarship information if needed

## 2019-10-18 NOTE — Telephone Encounter (Signed)
New order placed for screening mammogram.  Please contact patient to see if she needs scholarship information/application for mammogram

## 2019-10-18 NOTE — Telephone Encounter (Signed)
Susan Lopez with [facility] interpreter called patient and informed her with what provider stated. Per pt she have 100% discount with [facility]. Staff provided patient with [facility] number to call them and ask them if they will take [facility] 100% discount and if not to call office back. Per pt the discount that she have with [facility] is not the [employer].

## 2019-10-19 NOTE — Telephone Encounter (Signed)
I have placed the referral and I guess the [facility] will contact patient regarding referral but if patient does not hear from the [facility] Center in the next 2 weeks she should notify us

## 2019-10-22 ENCOUNTER — Encounter: Payer: Self-pay | Admitting: Obstetrics & Gynecology

## 2019-10-22 NOTE — Telephone Encounter (Signed)
Susan Lopez with [facility] interpreter called patient and informed her with what provider stated and she verbalized understanding.

## 2019-11-27 ENCOUNTER — Encounter: Payer: Self-pay | Admitting: Obstetrics and Gynecology

## 2019-11-27 ENCOUNTER — Other Ambulatory Visit (HOSPITAL_COMMUNITY)
Admission: RE | Admit: 2019-11-27 | Discharge: 2019-11-27 | Disposition: A | Discharge status: Home / Self Care | Payer: Self-pay | Source: Ambulatory Visit | Attending: Obstetrics and Gynecology | Admitting: Obstetrics and Gynecology

## 2019-11-27 ENCOUNTER — Other Ambulatory Visit: Payer: Self-pay

## 2019-11-27 ENCOUNTER — Ambulatory Visit (INDEPENDENT_AMBULATORY_CARE_PROVIDER_SITE_OTHER): Payer: Self-pay | Admitting: Obstetrics and Gynecology

## 2019-11-27 VITALS — BP 123/81 | HR 83 | Wt 173.0 lb

## 2019-11-27 DIAGNOSIS — N9489 Other specified conditions associated with female genital organs and menstrual cycle: Secondary | ICD-10-CM | POA: Insufficient documentation

## 2019-11-27 LAB — POCT PREGNANCY, URINE: Preg Test, Ur: NEGATIVE

## 2019-11-27 NOTE — Progress Notes (Signed)
58 yo postmenopausal here for endometrial biopsy. Patient had an incidental finding of a complex endometrial mass on recent ultrasound. Patient denies any vaginal bleeding  Blood pressure 123/81, pulse 83, weight 173 lb (78.5 kg), last menstrual period 10/08/2015. GENERAL: Well-developed, well-nourished female in no acute distress.  ABDOMEN: Soft, nontender, nondistended. No organomegaly. PELVIC: Normal external female genitalia. Vagina is pink and rugated.  Normal discharge. Normal appearing cervix. Uterus is normal in size. No adnexal mass or tenderness. EXTREMITIES: No cyanosis, clubbing, or edema, 2+ distal pulses.  10/2019 ultrasound FINDINGS: Uterus  Measurements: 7.2 x 4.3 x 4.7 cm = volume: 75 mL. Anteverted. Anterior wall Caesarean section scar. No definite myometrial mass.  Endometrium  Thickness: 23 mm. Heterogeneous thickening of the endometrial complex, appears to represent an irregular mass 3.2 x 2.1 x 2.7 cm in size. Mass demonstrates internal blood flow on color Doppler imaging. Finding is concerning for neoplasm. No definite endometrial fluid  Right ovary  Not visualized on either transabdominal or endovaginal imaging, likely obscured by bowel  Left ovary  Not visualized on either transabdominal or endovaginal imaging, likely obscured by bowel  Other findings  No free pelvic fluid or adnexal masses.  IMPRESSION: Mass-like enlargement of the endometrial complex measuring 3.2 x 2.1 x 2.7 cm concerning for an endometrial neoplasm.  Endometrial sampling recommended to exclude carcinoma.   Electronically Signed   By: Lavonia Dana M.D.   On: 10/18/2019 17:07   A/P 59 yo with complex endometrial mass - ENDOMETRIAL BIOPSY     The indications for endometrial biopsy were reviewed.   Risks of the biopsy including cramping, bleeding, infection, uterine perforation, inadequate specimen and need for additional procedures  were discussed. The patient  states she understands and agrees to undergo procedure today. Consent was signed. Time out was performed. Urine HCG was negative. A sterile speculum was placed in the patient's vagina and the cervix was prepped with Betadine. A single-toothed tenaculum was placed on the anterior lip of the cervix to stabilize it. The uterine cavity was sounded to a depth of 7 cm using the uterine sound. The 3 mm pipelle was introduced into the endometrial cavity without difficulty, 2 passes were made.  A  moderate amount of tissue was  sent to pathology. The instruments were removed from the patient's vagina. Minimal bleeding from the cervix was noted. The patient tolerated the procedure well.  Routine post-procedure instructions were given to the patient. The patient will follow up in two weeks to review the results and for further management.

## 2019-11-28 ENCOUNTER — Telehealth: Payer: Self-pay

## 2019-11-28 LAB — SURGICAL PATHOLOGY

## 2019-11-28 NOTE — Telephone Encounter (Addendum)
-----   Message from Susan Bellman, MD sent at 11/28/2019 11:14 AM EST ----- Please inform patient of normal endometrial biopsy.   Called Susan Lopez with Spanish Interpreter Raquel M., and informed Susan Lopez that her endo bx are normal.  Susan Lopez stated thank you with no further questions.   Mel Almond, RN 11/28/19

## 2019-12-03 ENCOUNTER — Telehealth: Payer: Self-pay

## 2019-12-03 NOTE — Addendum Note (Signed)
Addended by: Mora Bellman on: 12/03/2019 11:03 AM   Modules accepted: Orders

## 2019-12-03 NOTE — Telephone Encounter (Signed)
-----   Message from Mora Bellman, MD sent at 12/03/2019 11:02 AM EST ----- Please inform and schedule patient for pelvic ultrasound in 1 month to follow up on her uterine mass. Her endometrial biopsy was negative  Thanks  [employer]

## 2019-12-03 NOTE — Telephone Encounter (Signed)
Called pt to advise of [name] appointment & test results using [place] id # [phone], pt did not answer, left VM for pt to call us back.

## 2019-12-04 ENCOUNTER — Telehealth: Payer: Self-pay

## 2019-12-04 ENCOUNTER — Other Ambulatory Visit (HOSPITAL_COMMUNITY): Payer: Self-pay

## 2019-12-04 DIAGNOSIS — Z1231 Encounter for screening mammogram for malignant neoplasm of breast: Secondary | ICD-10-CM

## 2019-12-04 MED FILL — OMEPRAZOLE DR 40 MG CAPSULE: 40 | 30 days supply | Qty: 30 | Fill #2

## 2019-12-04 MED FILL — GABAPENTIN 100 MG CAPSULE: 100 | 30 days supply | Qty: 60 | Fill #2

## 2019-12-04 MED FILL — ?ATORVASTATIN 20 MG TABLET: 20 | 30 days supply | Qty: 30 | Fill #2

## 2019-12-04 MED FILL — LISINOPRIL 10 MG TABS: 10 | 30 days supply | Qty: 30 | Fill #2

## 2019-12-04 MED FILL — MELOXICAM 7.5 MG TABLET: 7.5 | 30 days supply | Qty: 30 | Fill #2

## 2019-12-04 NOTE — Telephone Encounter (Signed)
-----   Message from Bethanne Ginger, [place] sent at 12/03/2019  4:33 PM EST ----- Please call Pt Back  Pam ----- Message ----- From: Mora Bellman, MD Sent: 12/03/2019  11:02 AM EST To: [facility] Clinical Pool  Please inform and schedule patient for pelvic ultrasound in 1 month to follow up on her uterine mass. Her endometrial biopsy was negative  Thanks  [employer]

## 2019-12-04 NOTE — Telephone Encounter (Signed)
Called pt using [place]" id # [id number] from [facility], advised of Endometrial test results & her upcoming [name] appointment on 01/02/20. Pt verbalized understanding.

## 2019-12-06 ENCOUNTER — Other Ambulatory Visit: Payer: Self-pay

## 2019-12-06 ENCOUNTER — Encounter: Payer: Self-pay | Admitting: Family Medicine

## 2019-12-06 ENCOUNTER — Ambulatory Visit: Payer: Self-pay | Attending: Family Medicine | Admitting: Family Medicine

## 2019-12-06 VITALS — BP 134/78 | HR 87 | Ht 59.0 in | Wt 176.8 lb

## 2019-12-06 DIAGNOSIS — K219 Gastro-esophageal reflux disease without esophagitis: Secondary | ICD-10-CM

## 2019-12-06 DIAGNOSIS — R1013 Epigastric pain: Secondary | ICD-10-CM

## 2019-12-06 DIAGNOSIS — Z758 Other problems related to medical facilities and other health care: Secondary | ICD-10-CM

## 2019-12-06 DIAGNOSIS — R110 Nausea: Secondary | ICD-10-CM

## 2019-12-06 DIAGNOSIS — R1310 Dysphagia, unspecified: Secondary | ICD-10-CM

## 2019-12-06 DIAGNOSIS — R1011 Right upper quadrant pain: Secondary | ICD-10-CM

## 2019-12-06 DIAGNOSIS — Z603 Acculturation difficulty: Secondary | ICD-10-CM

## 2019-12-06 DIAGNOSIS — Z789 Other specified health status: Secondary | ICD-10-CM

## 2019-12-06 MED ORDER — ONDANSETRON HCL 4 MG PO TABS: 4.0000 mg | ORAL_TABLET | Freq: Three times a day (TID) | ORAL | 0 refills | Status: DC | PRN

## 2019-12-06 MED ORDER — OMEPRAZOLE 40 MG PO CPDR: 40.0000 mg | DELAYED_RELEASE_CAPSULE | Freq: Every day | ORAL | 3 refills | Status: DC

## 2019-12-06 MED FILL — ?ONDANSETRON HCL 4 MG TABLE: 4 | 6 days supply | Qty: 20 | Fill #0

## 2019-12-06 NOTE — Patient Instructions (Signed)
Opciones de alimentos para pacientes adultos con enfermedad de reflujo gastroesofgico Food Choices for Gastroesophageal Reflux Disease, Adult Si tiene enfermedad de reflujo gastroesofgico (ERGE), los alimentos que consume y los hbitos de alimentacin son muy importantes. Elegir los alimentos adecuados puede ayudar a aliviar las molestias. Piense en consultar a un especialista en nutricin (nutricionista) para que lo ayude a hacer buenas elecciones. Consejos para seguir este plan  Comidas  Elija alimentos saludables con bajo contenido de grasa, como frutas, verduras, cereales integrales, productos lcteos descremados y carne magra de vaca, de pescado y de ave.  Haga comidas pequeas durante el da en lugar de 3 comidas abundantes. Coma lentamente y en un lugar donde est distendido. Evite agacharse o recostarse hasta 2 o 3horas despus de haber comido.  Evite comer 2 a 3horas antes de ir a acostarse.  Evite beber grandes cantidades de lquidos con las comidas.  Evite frer los alimentos a la hora de la coccin. Puede hornear, grillar o asar a la parrilla.  Evite o limite la cantidad de: ? Chocolate. ? Menta y mentol. ? Alcohol. ? Pimienta. ? Caf negro y descafeinado. ? T negro y descafeinado. ? Bebidas con gas (gaseosas). ? Bebidas energizantes y refrescos que contengan cafena.  Limite los alimentos con alto contenido de grasas, por ejemplo: ? Carnes grasas o alimentos fritos. ? Leche entera, crema, manteca o helado. ? Nueces y mantequillas de frutos secos. ? Pastelera, donas y dulces hechos con manteca o margarina.  Evite los alimentos que le ocasionen sntomas. Estos pueden ser distintos para cada persona. Los alimentos que suelen causan sntomas son los siguientes: ? Tomates. ? Naranjas, limones y limas. ? Pimientos. ? Comidas condimentadas. ? Cebolla y ajo. ? Vinagre. Estilo de vida  Mantenga un peso saludable. Pregntele a su mdico cul es el peso saludable  para usted. Si necesita perder peso, hable con su mdico para hacerlo de manera segura.  Realice actividad fsica durante, al menos, 30 minutos 5 das por semana o ms, o segn lo indicado por su mdico.  Use ropa suelta.  No fume. Si necesita ayuda para dejar de fumar, consulte al mdico.  Duerma con la cabecera de la cama ms elevada que los pies. Use una cua debajo del colchn o bloques debajo del armazn de la cama para mantener la cabecera de la cama elevada. Resumen  Si tiene enfermedad de reflujo gastroesofgico (ERGE), las elecciones de alimentos y el estilo de vida son muy importantes para ayudar a aliviar los sntomas.  Haga comidas pequeas durante el da en lugar de 3 comidas abundantes. Coma lentamente y en un lugar donde est distendido.  Limite los alimentos con alto contenido graso como la carne grasa o los alimentos fritos.  Evite agacharse o recostarse hasta 2 o 3horas despus de haber comido.  Evite la menta y hierba buena, la cafena, el alcohol y el chocolate. Esta informacin no tiene como fin reemplazar el consejo del mdico. Asegrese de hacerle al mdico cualquier pregunta que tenga. Document Revised: 05/23/2017 Document Reviewed: 05/23/2017 Elsevier Patient Education  2020 Else[employer] Inc.  

## 2019-12-06 NOTE — Progress Notes (Signed)
Pain in right side of abdomen.

## 2019-12-06 NOTE — Progress Notes (Signed)
Established Patient Office Visit  Subjective:  Patient ID: Susan Lopez, female    DOB: 1961/03/07  Age: 59 y.o. MRN: [id number]  CC:  Chief Complaint  Patient presents with  . Abdominal Pain    HPI Susan Lopez, 59 year old Hispanic female, who presents secondary to complaint of worsening mid upper abdominal pain which radiates to the right upper quadrant and around to her back. She states that sometimes the pain is so severe that she feels as if she has difficulty taking a deep breath due to the pain in her back when she is having the abdominal pain. She has been taking over-the-counter Advil for this pain. She does have a history of a herniated disc in her back and she states that she takes Tylenol arthritis for this pain. She denies any current issues with blood in the stool or black/tarry stools. She reports that about 5 months ago she had blood in the stool and went to an urgent care and was told that this was normal because she had constipation at the time but that she might need further follow-up if this continues to occur but she has seen no further blood in the stool.         She reports that the pain sometimes gets up to an 8 on a 0-to-10 scale with 0 being no pain and 10 being the worst imaginable pain. Pain is burning and sometimes sharp. Pain worsens after eating as she also develops increased reflux symptoms. She has burping or sensation of needing to burp as well as backwash of bad tasting liquid into her mouth and throat. She has had one episode of vomiting after developing sensation of her food coming back up. She has also had some substernal burning sensation. She has been taking her omeprazole 40 mg once daily and states that this does decrease the symptoms. She has had reflux symptoms for the past year but states that over the past 2 months she has had increased reflux symptoms and increase in mid upper abdominal pain with further increase over the past 2  weeks.         She has had no left-sided chest pain. No cough. No shortness of breath except with sensation of increased back pain which radiates from the abdominal area. She does have a history of removal of the gallbladder. She has history of constipation but no diarrhea. No headaches or dizziness. She has felt fatigued. No urinary frequency, urgency or dysuria.  Past Medical History:  Diagnosis Date  . Allergy   . Anxiety   . Arthritis   . Bone lesion 10/04/2013  . Calculus of gallbladder with acute and chronic cholecystitis without obstruction, s/p lap chole 27Dec2012 09/19/2011  . Cataract 2009   bilateral  . Complication of anesthesia   . Depression   . Gallstones   . GERD (gastroesophageal reflux disease)   . Hemorrhoids, external 10-21-11   occ. bothersome  . Hyperlipidemia   . Hypertension   . Iron deficiency anemia   . Mammogram abnormal 10/04/2013  . PONV (postoperative nausea and vomiting)   . Sleep apnea     Past Surgical History:  Procedure Laterality Date  . [place]  . CHOLECYSTECTOMY  10/27/2011   Procedure: LAPAROSCOPIC CHOLECYSTECTOMY WITH INTRAOPERATIVE CHOLANGIOGRAM;  Surgeon: Adin Hector, MD;  Location: [facility];  Service: General;  Laterality: N/A;  Laparoscopic Chole w/ IOC Single Site  . EYE SURGERY  1`12-21-10   bil. for  tissue growth-laser surgery  . HARDWARE REMOVAL Left 03/16/2016   Procedure: LEFT ANKLE HARDWARE REMOVAL;  Surgeon: Leandrew Koyanagi, MD;  Location: [facility];  Service: Orthopedics;  Laterality: Left;  Marland Kitchen MM BREAST STEREO BIOPSY LEFT ([place] [facility]) Left 2015  . ORIF ANKLE FRACTURE Left 11/13/2015   Procedure: OPEN REDUCTION INTERNAL FIXATION (ORIF) LEFT BIMALLEOLAR ANKLE FRACTURE;  Surgeon: Leandrew Koyanagi, MD;  Location: [facility];  Service: Orthopedics;  Laterality: Left;    Family History  Problem Relation Age of Onset  . Hypertension Mother   . Cancer Mother   . Hypertension Father   . Colon cancer Neg Hx      Social History   Socioeconomic History  . Marital status: Single    Spouse name: Not on file  . Number of children: Not on file  . Years of education: Not on file  . Highest education level: Not on file  Occupational History  . Not on file  Tobacco Use  . Smoking status: Former Smoker    Packs/day: 0.25    Years: 10.00    Pack years: 2.50    Quit date: 10/20/2004    Years since quitting: 15.1  . Smokeless tobacco: Never Used  Substance and Sexual Activity  . Alcohol use: No  . Drug use: No  . Sexual activity: Never    Birth control/protection: Post-menopausal  Other Topics Concern  . Not on file  Social History Narrative  . Not on file   Social Determinants of Health   Financial Resource Strain:   . Difficulty of Paying Living Expenses: Not on file  Food Insecurity:   . Worried About Charity fundraiser in the Last Year: Not on file  . Ran Out of Food in the Last Year: Not on file  Transportation Needs:   . Lack of Transportation (Medical): Not on file  . Lack of Transportation (Non-Medical): Not on file  Physical Activity:   . Days of Exercise per Week: Not on file  . Minutes of Exercise per Session: Not on file  Stress:   . Feeling of Stress : Not on file  Social Connections:   . Frequency of Communication with Friends and Family: Not on file  . Frequency of Social Gatherings with Friends and Family: Not on file  . Attends Religious Services: Not on file  . Active Member of Clubs or Organizations: Not on file  . Attends Archivist Meetings: Not on file  . Marital Status: Not on file  Intimate Partner Violence:   . Fear of Current or Ex-Partner: Not on file  . Emotionally Abused: Not on file  . Physically Abused: Not on file  . Sexually Abused: Not on file    Outpatient Medications Prior to Visit  Medication Sig Dispense Refill  . aspirin EC 325 MG tablet Take 1 tablet (325 mg total) by mouth 2 (two) times daily. 84 tablet 0  . atorvastatin  (LIPITOR) 20 MG tablet Take 1 tablet (20 mg total) by mouth daily. To lower cholesterol 30 tablet 2  . cyclobenzaprine (FLEXERIL) 10 MG tablet TAKE 1 TABLET BY MOUTH AT BEDTIME AS NEEDED FOR MUSCLE SPASMS. 30 tablet 2  . gabapentin (NEURONTIN) 100 MG capsule TAKE 1 CAPSULE BY MOUTH 2 TIMES DAILY. 60 capsule 3  . lisinopril (ZESTRIL) 10 MG tablet Take 1 tablet (10 mg total) by mouth daily. To lower blood pressure 30 tablet 2  . meloxicam (MOBIC) 7.5 MG tablet TAKE 1 TABLET  BY MOUTH DAILY. 30 tablet 2  . Multiple Vitamin (MULTIVITAMIN WITH MINERALS) TABS tablet Take 1 tablet by mouth daily.    Marland Kitchen omeprazole (PRILOSEC) 40 MG capsule Take 1 capsule (40 mg total) by mouth daily. To reduce stomach acid 30 capsule 2  . polyethylene glycol powder (GLYCOLAX/MIRALAX) powder Take 17 g by mouth daily. As needed for constipation relief 3350 g 11  . Probiotic Product (PROBIOTIC PO) Take by mouth daily.    Marland Kitchen tolterodine (DETROL LA) 4 MG 24 hr capsule Take 1 capsule (4 mg total) by mouth daily. To help with bladder leakage 30 capsule 6  . amoxicillin-clavulanate (AUGMENTIN) 500-125 MG tablet Take 1 tablet (500 mg total) by mouth 2 (two) times daily. Take after eating (Patient not taking: Reported on 12/06/2019) 20 tablet 0  . clotrimazole-betamethasone (LOTRISONE) cream Apply 1 application topically 2 (two) times daily. As needed for skin irritation (Patient not taking: Reported on 10/09/2019) 30 g 2   Facility-Administered Medications Prior to Visit  Medication Dose Route Frequency Provider Last Rate Last Admin  . 0.9 %  sodium chloride infusion  500 mL Intravenous Once Milus Banister, MD        Allergies  Allergen Reactions  . Hydrocodone Nausea And Vomiting  . Tramadol Nausea Only  . Oxycodone Rash    ROS Review of Systems    Objective:    Physical Exam  Constitutional: She is oriented to person, place, and time. She appears well-nourished.  Well-nourished well-developed overweight for height/obese  older female in no acute distress but she appears to be fatigued  Neck: No JVD present. No thyromegaly present.  Cardiovascular: Normal rate and regular rhythm.  Pulmonary/Chest: Effort normal and breath sounds normal.  Abdominal: Soft. She exhibits distension. There is abdominal tenderness. There is no rebound and no guarding.  Patient with epigastric tenderness to palpation with wincing but no rebound or guarding. She reports right upper quadrant discomfort with palpation but does not seem to be bothered by palpation of this area.  Musculoskeletal:        General: Tenderness present. No edema.     Cervical back: Normal range of motion and neck supple.     Comments: Tenderness of the mid to lower thoracic spine. No CVA tenderness.  Lymphadenopathy:    She has no cervical adenopathy.  Neurological: She is alert and oriented to person, place, and time.  Skin: Skin is warm and dry. No rash noted.  Psychiatric: She has a normal mood and affect. Her behavior is normal.  Nursing note and vitals reviewed.   BP 134/78   Pulse 87   Ht 4\' 11"  (1.499 m)   Wt 176 lb 12.8 oz (80.2 kg)   LMP 10/08/2015   SpO2 98%   BMI 35.71 kg/m  Wt Readings from Last 3 Encounters:  12/06/19 176 lb 12.8 oz (80.2 kg)  11/27/19 173 lb (78.5 kg)  10/09/19 172 lb 4.8 oz (78.2 kg)     Health Maintenance Due  Topic Date Due  . HIV Screening  12/18/1975  . TETANUS/TDAP  12/18/1979  . INFLUENZA VACCINE  06/01/2019  . MAMMOGRAM  09/29/2019    There are no preventive care reminders to display for this patient.  Lab Results  Component Value Date   TSH 1.090 07/18/2018   Lab Results  Component Value Date   WBC 6.5 03/07/2018   HGB 14.3 03/07/2018   HCT 41.8 03/07/2018   MCV 92.2 03/07/2018   PLT 248.0 03/07/2018  Lab Results  Component Value Date   NA 144 07/18/2018   K 4.0 07/18/2018   CHLORIDE 108 10/08/2013   CO2 24 07/18/2018   GLUCOSE 110 (H) 07/18/2018   BUN 8 07/18/2018   CREATININE  0.64 07/18/2018   BILITOT 0.6 07/18/2018   ALKPHOS 116 07/18/2018   AST 18 07/18/2018   ALT 23 07/18/2018   PROT 7.2 07/18/2018   ALBUMIN 4.6 07/18/2018   CALCIUM 9.2 07/18/2018   ANIONGAP 8 11/13/2015   GFR 118.50 03/07/2018   Lab Results  Component Value Date   CHOL 173 12/13/2017   Lab Results  Component Value Date   HDL 45 12/13/2017   Lab Results  Component Value Date   LDLCALC 82 12/13/2017   Lab Results  Component Value Date   TRIG 230 (H) 12/13/2017   Lab Results  Component Value Date   CHOLHDL 3.8 12/13/2017   Lab Results  Component Value Date   HGBA1C 5.8 (H) 12/13/2017      Assessment & Plan:  1. Epigastric pain; 2. Dysphagia; 3. Nausea; 4. GERD ; 5. right upper quadrant pain patient with complaint of worsening epigastric pain which now radiates around her right upper abdomen to her mid back. Discussed with patient that I would like for her to follow-up with gastroenterology as she likely needs endoscopy to make sure that she does not have an ulcer. She will have CBC, comprehensive metabolic panel and lipase at today's visit in follow-up. Handout given on food choices for acid reflux. If she has acute worsening of her abdominal pain, blood in the stool, black stools or any other concerns she should go to the emergency department for further evaluation. Prescription given for Zofran to take as needed for nausea. Prescription provided to increase omeprazole 40 mg to twice daily and referral placed to gastroenterology. Patient also with complaint of right upper quadrant pain and she has had prior cholecystectomy. Unsure if patient may have a retained stone. - Ambulatory referral to Gastroenterology - CBC - Comprehensive metabolic panel - Lipase - ondansetron (ZOFRAN) 4 MG tablet; Take 1 tablet (4 mg total) by mouth every 8 (eight) hours as needed for nausea or vomiting.  Dispense: 20 tablet; Refill: 0 - omeprazole (PRILOSEC) 40 MG capsule; Take 1 capsule (40 mg  total) by mouth daily. To reduce stomach acid  Dispense: 60 capsule; Refill: 3  6. Language barrier Stratus video interpretation system used at today's visit to help with language barrier to avoid miscommunication of health information and to obtain accurate medical history   An After Visit Summary was printed and given to the patient.  Follow-up: Return in about 2 weeks (around 12/20/2019) for abdominal pain- ED if pain worsens; F/u with GI.  Greater than 30 minutes of face-to-face time was spent with the patient at today's visit  Antony Blackbird, MD

## 2019-12-07 LAB — COMPREHENSIVE METABOLIC PANEL WITH GFR
ALT: 53 IU/L — ABNORMAL HIGH (ref 0–32)
AST: 23 IU/L (ref 0–40)
Albumin/Globulin Ratio: 1.4 (ref 1.2–2.2)
Albumin: 4.1 g/dL (ref 3.8–4.9)
Alkaline Phosphatase: 135 IU/L — ABNORMAL HIGH (ref 39–117)
BUN/Creatinine Ratio: 14 (ref 9–23)
BUN: 9 mg/dL (ref 6–24)
Bilirubin Total: 0.4 mg/dL (ref 0.0–1.2)
CO2: 21 mmol/L (ref 20–29)
Calcium: 9.3 mg/dL (ref 8.7–10.2)
Chloride: 105 mmol/L (ref 96–106)
Creatinine, Ser: 0.65 mg/dL (ref 0.57–1.00)
GFR calc Af Amer: 113 mL/min/1.73
GFR calc non Af Amer: 98 mL/min/1.73
Globulin, Total: 2.9 g/dL (ref 1.5–4.5)
Glucose: 158 mg/dL — ABNORMAL HIGH (ref 65–99)
Potassium: 4 mmol/L (ref 3.5–5.2)
Sodium: 141 mmol/L (ref 134–144)
Total Protein: 7 g/dL (ref 6.0–8.5)

## 2019-12-07 LAB — CBC
Hematocrit: 43.9 % (ref 34.0–46.6)
Hemoglobin: 14.9 g/dL (ref 11.1–15.9)
MCH: 31 pg (ref 26.6–33.0)
MCHC: 33.9 g/dL (ref 31.5–35.7)
MCV: 92 fL (ref 79–97)
Platelets: 257 x10E3/uL (ref 150–450)
RBC: 4.8 x10E6/uL (ref 3.77–5.28)
RDW: 12.5 % (ref 11.7–15.4)
WBC: 6 x10E3/uL (ref 3.4–10.8)

## 2019-12-07 LAB — LIPASE: Lipase: 38 U/L (ref 14–72)

## 2019-12-17 ENCOUNTER — Other Ambulatory Visit: Payer: Self-pay

## 2019-12-17 ENCOUNTER — Encounter: Payer: Self-pay | Admitting: Nurse Practitioner

## 2019-12-17 ENCOUNTER — Ambulatory Visit (INDEPENDENT_AMBULATORY_CARE_PROVIDER_SITE_OTHER): Payer: Self-pay | Admitting: Nurse Practitioner

## 2019-12-17 VITALS — BP 128/82 | HR 72 | Temp 98.6°F | Ht 59.0 in | Wt 175.4 lb

## 2019-12-17 DIAGNOSIS — R1011 Right upper quadrant pain: Secondary | ICD-10-CM

## 2019-12-17 DIAGNOSIS — G8929 Other chronic pain: Secondary | ICD-10-CM

## 2019-12-17 DIAGNOSIS — Z01818 Encounter for other preprocedural examination: Secondary | ICD-10-CM

## 2019-12-17 DIAGNOSIS — K219 Gastro-esophageal reflux disease without esophagitis: Secondary | ICD-10-CM

## 2019-12-17 DIAGNOSIS — K5909 Other constipation: Secondary | ICD-10-CM

## 2019-12-17 NOTE — Patient Instructions (Signed)
If you are age 59 or older, your body mass index should be between 23-30. Your Body mass index is 35.42 kg/m. If this is out of the aforementioned range listed, please consider follow up with your Primary Care Provider.  If you are age 58 or younger, your body mass index should be between 19-25. Your Body mass index is 35.42 kg/m. If this is out of the aformentioned range listed, please consider follow up with your Primary Care Provider.   You have been scheduled for an endoscopy. Please follow written instructions given to you at your visit today. If you use inhalers (even only as needed), please bring them with you on the day of your procedure.

## 2019-12-17 NOTE — Progress Notes (Signed)
IMPRESSION and PLAN:    #  GERD Regurgitation / burning despite reduction in caffeine, milk and a daily PPI. For refractory GERD symptoms patient will be scheduled for EGD. The risks and benefits of EGD were discussed and the patient agrees to proceed.   #  Chronic intermittent RUQ pain.  Remote cholecystectomy. Labs unrevealing. U/S in 2019 for same pain was unrevealing. Based on history she gives, pain seems to be musculoskeletal in nature. However, she has been on Meloxicam for years so PUD a consideration. Further evaluation at time of EGD  #  Chronic constipation, stable on daily regimen  #   Fatty liver disease.  Mildly elevated ALT. Weight loss important part of management, discussed this with patient.     HPI:    Primary GI: Dr. Ardis Hughs.   Chief complaint :  Abdominal pain and acid reflux   Susan Lopez is a 59 y.o. female with a pmh significant for but not limited to HTN, Hyperlipidemia, obesity, colon polyps and chronic constipation.   Patient non-English speaking, here with interpreter for evaluation of ongoing intermittent RUQ pain with radiation through to her back. She saw Dr. Ardis Hughs for same pain in May 2019, RUQ U/S was unrevealing. She is s/p remote cholecystectomy. Pain felt possibly to be due to constipation. She has continued to have intermittent pain. It interferes with many physical activities such as climbing stairs, bending. Eating doesn't cause the pain but any PO intake does result in burning in her chest and acid regurgitation. Reduction of coffee and milk have helped. Even water burns. Takes daily Prilosec on an empty stomach. Symptoms present for about a year. Of note, she has taken Mobic every day for ~ 3 years. Her bowels are moving okay on Fiber 2-3 times a week. Sometimes if really constipated will take Miralax.   Data Reviewed:  12/06/19 CBC, lipase WNL ALT 53, liver tests otherwise WNL  Review of systems:     No chest pain, no  SOB, no fevers, no urinary sx   Past Medical History:  Diagnosis Date  . Allergy   . Anxiety   . Arthritis   . Bone lesion 10/04/2013  . Calculus of gallbladder with acute and chronic cholecystitis without obstruction, s/p lap chole 27Dec2012 09/19/2011  . Cataract 2009   bilateral  . Colon polyps   . Complication of anesthesia   . Depression   . Gallstones   . GERD (gastroesophageal reflux disease)   . Hemorrhoids, external 10-21-11   occ. bothersome  . Hyperlipidemia   . Hypertension   . Iron deficiency anemia   . Mammogram abnormal 10/04/2013  . PONV (postoperative nausea and vomiting)   . Sleep apnea     Patient's surgical history, family medical history, social history, medications and allergies were all reviewed in Epic   Serum creatinine: 0.65 mg/dL 12/06/19 1123 Estimated creatinine clearance: 69.9 mL/min  Current Outpatient Medications  Medication Sig Dispense Refill  . aspirin EC 325 MG tablet Take 1 tablet (325 mg total) by mouth 2 (two) times daily. 84 tablet 0  . atorvastatin (LIPITOR) 20 MG tablet Take 1 tablet (20 mg total) by mouth daily. To lower cholesterol 30 tablet 2  . clotrimazole-betamethasone (LOTRISONE) cream Apply 1 application topically 2 (two) times daily. As needed for skin irritation 30 g 2  . cyclobenzaprine (FLEXERIL) 10 MG tablet TAKE 1 TABLET BY MOUTH AT BEDTIME AS NEEDED FOR MUSCLE SPASMS. 30 tablet  2  . gabapentin (NEURONTIN) 100 MG capsule TAKE 1 CAPSULE BY MOUTH 2 TIMES DAILY. 60 capsule 3  . lisinopril (ZESTRIL) 10 MG tablet Take 1 tablet (10 mg total) by mouth daily. To lower blood pressure 30 tablet 2  . meloxicam (MOBIC) 7.5 MG tablet TAKE 1 TABLET BY MOUTH DAILY. 30 tablet 2  . Multiple Vitamin (MULTIVITAMIN WITH MINERALS) TABS tablet Take 1 tablet by mouth daily.    Marland Kitchen omeprazole (PRILOSEC) 40 MG capsule Take 1 capsule (40 mg total) by mouth daily. To reduce stomach acid 60 capsule 3  . ondansetron (ZOFRAN) 4 MG tablet Take 1 tablet  (4 mg total) by mouth every 8 (eight) hours as needed for nausea or vomiting. (Patient taking differently: Take 4 mg by mouth as needed for nausea or vomiting. ) 20 tablet 0  . polyethylene glycol powder (GLYCOLAX/MIRALAX) powder Take 17 g by mouth daily. As needed for constipation relief 3350 g 11  . Probiotic Product (PROBIOTIC PO) Take by mouth daily.    . psyllium (METAMUCIL) 58.6 % powder Take 1 packet by mouth as needed. 2 x weekly     No current facility-administered medications for this visit.    Physical Exam:     BP 128/82   Pulse 72   Temp 98.6 F (37 C)   Ht 4\' 11"  (1.499 m)   Wt 175 lb 6 oz (79.5 kg)   LMP 10/08/2015   BMI 35.42 kg/m   GENERAL:  Pleasant female in NAD PSYCH: : Cooperative, normal affect EENT:  conjunctiva pink, mucous membranes moist, neck supple without masses CARDIAC:  RRR,  no peripheral edema PULM: Normal respiratory effort, lungs CTA bilaterally, no wheezing ABDOMEN:  Nondistended, soft, nontender. No obvious masses, no hepatomegaly,  normal bowel sounds SKIN:  turgor, no lesions seen NEURO: Alert and oriented x 3, no focal neurologic deficits   [name] , NP 12/17/2019, 10:03 AM

## 2019-12-18 NOTE — Progress Notes (Signed)
I agree with the above note, plan 

## 2019-12-26 ENCOUNTER — Ambulatory Visit: Payer: Self-pay | Admitting: Family Medicine

## 2019-12-26 ENCOUNTER — Other Ambulatory Visit: Payer: Self-pay | Admitting: Gastroenterology

## 2019-12-26 ENCOUNTER — Ambulatory Visit (INDEPENDENT_AMBULATORY_CARE_PROVIDER_SITE_OTHER): Payer: Self-pay

## 2019-12-26 DIAGNOSIS — Z1159 Encounter for screening for other viral diseases: Secondary | ICD-10-CM

## 2019-12-27 LAB — SARS CORONAVIRUS 2 (TAT 6-24 HRS): SARS Coronavirus 2: NEGATIVE

## 2019-12-30 ENCOUNTER — Encounter: Payer: Self-pay | Admitting: Gastroenterology

## 2019-12-30 ENCOUNTER — Ambulatory Visit (AMBULATORY_SURGERY_CENTER): Payer: Self-pay | Admitting: Gastroenterology

## 2019-12-30 ENCOUNTER — Other Ambulatory Visit: Payer: Self-pay

## 2019-12-30 VITALS — BP 93/47 | HR 73 | Temp 97.8°F | Resp 17 | Ht 59.0 in | Wt 175.0 lb

## 2019-12-30 DIAGNOSIS — R1011 Right upper quadrant pain: Secondary | ICD-10-CM

## 2019-12-30 DIAGNOSIS — K2950 Unspecified chronic gastritis without bleeding: Secondary | ICD-10-CM

## 2019-12-30 DIAGNOSIS — G8929 Other chronic pain: Secondary | ICD-10-CM

## 2019-12-30 MED ORDER — SODIUM CHLORIDE 0.9 % IV SOLN: 500.0000 mL | Freq: Once | INTRAVENOUS | Status: DC

## 2019-12-30 NOTE — Op Note (Signed)
[facility] Patient Name: Susan Lopez Procedure Date: 12/30/2019 8:44 AM MRN: [id number] Endoscopist: Milus Banister , MD Age: 59 Referring MD:  Date of Birth: [birth year] Gender: Female Account #: [id number] Procedure:                Upper GI endoscopy Indications:              Abdominal pain in the right upper quadrant Medicines:                Monitored Anesthesia Care Procedure:                Pre-Anesthesia Assessment:                           - Prior to the procedure, a History and Physical                            was performed, and patient medications and                            allergies were reviewed. The patient's tolerance of                            previous anesthesia was also reviewed. The risks                            and benefits of the procedure and the sedation                            options and risks were discussed with the patient.                            All questions were answered, and informed consent                            was obtained. Prior Anticoagulants: The patient has                            taken no previous anticoagulant or antiplatelet                            agents. ASA Grade Assessment: II - A patient with                            mild systemic disease. After reviewing the risks                            and benefits, the patient was deemed in                            satisfactory condition to undergo the procedure.                           After obtaining informed consent, the endoscope was  passed under direct vision. Throughout the                            procedure, the patient's blood pressure, pulse, and                            oxygen saturations were monitored continuously. The                            Endoscope was introduced through the mouth, and                            advanced to the second part of duodenum. The upper                            GI  endoscopy was accomplished without difficulty.                            The patient tolerated the procedure well. Scope In: Scope Out: Findings:                 Diffuse mild inflammation characterized by                            erythema, friability and granularity was found in                            the entire examined stomach. Biopsies were taken                            with a cold forceps for histology.                           The exam was otherwise without abnormality. Complications:            No immediate complications. Estimated blood loss:                            None. Estimated Blood Loss:     Estimated blood loss: none. Impression:               - Gastritis, biopsied to check for H. pylori.                           - The examination was otherwise normal. Recommendation:           - Patient has a contact number available for                            emergencies. The signs and symptoms of potential                            delayed complications were discussed with the                            patient. Return to normal activities tomorrow.  Written discharge instructions were provided to the                            patient.                           - Resume previous diet.                           - Continue present medications.                           - Await pathology results. Milus Banister, MD 12/30/2019 9:00:48 AM This report has been signed electronically.

## 2019-12-30 NOTE — Progress Notes (Signed)
PT taken to PACU. Monitors in place. VSS. Report given to RN. 

## 2019-12-30 NOTE — Progress Notes (Signed)
Pt's states no medical or surgical changes since previsit or office visit.  Temp- June Vitals- Donna 

## 2019-12-30 NOTE — Patient Instructions (Addendum)
Handout given: gastritis Resume previous diet Continue current medications Await pathology results      YOU HAD AN ENDOSCOPIC PROCEDURE TODAY AT [facility]:   Refer to the procedure report that was given to you for any specific questions about what was found during the examination.  If the procedure report does not answer your questions, please call your gastroenterologist to clarify.  If you requested that your care partner not be given the details of your procedure findings, then the procedure report has been included in a sealed envelope for you to review at your convenience later.  YOU SHOULD EXPECT: Some feelings of bloating in the abdomen. Passage of more gas than usual.  Walking can help get rid of the air that was put into your GI tract during the procedure and reduce the bloating. If you had a lower endoscopy (such as a colonoscopy or flexible sigmoidoscopy) you may notice spotting of blood in your stool or on the toilet paper. If you underwent a bowel prep for your procedure, you may not have a normal bowel movement for a few days.  Please Note:  You might notice some irritation and congestion in your nose or some drainage.  This is from the oxygen used during your procedure.  There is no need for concern and it should clear up in a day or so.  SYMPTOMS TO REPORT IMMEDIATELY:   Following upper endoscopy (EGD)  Vomiting of blood or coffee ground material  New chest pain or pain under the shoulder blades  Painful or persistently difficult swallowing  New shortness of breath  Fever of 100F or higher  Black, tarry-looking stools  For urgent or emergent issues, a gastroenterologist can be reached at any hour by calling [phone].   DIET:  We do recommend a small meal at first, but then you may proceed to your regular diet.  Drink plenty of fluids but you should avoid alcoholic beverages for 24 hours.  ACTIVITY:  You should plan to take it easy for the rest  of today and you should NOT DRIVE or use heavy machinery until tomorrow (because of the sedation medicines used during the test).    FOLLOW UP: Our staff will call the number listed on your records 48-72 hours following your procedure to check on you and address any questions or concerns that you may have regarding the information given to you following your procedure. If we do not reach you, we will leave a message.  We will attempt to reach you two times.  During this call, we will ask if you have developed any symptoms of COVID 19. If you develop any symptoms (ie: fever, flu-like symptoms, shortness of breath, cough etc.) before then, please call [phone].  If you test positive for Covid 19 in the 2 weeks post procedure, please call and report this information to [name].    If any biopsies were taken you will be contacted by phone or by letter within the next 1-3 weeks.  Please call us at [phone] if you have not heard about the biopsies in 3 weeks.    SIGNATURES/CONFIDENTIALITY: You and/or your care partner have signed paperwork which will be entered into your electronic medical record.  These signatures attest to the fact that that the information above on your After Visit Summary has been reviewed and is understood.  Full responsibility of the confidentiality of this discharge information lies with you and/or your care-partner.

## 2019-12-30 NOTE — Progress Notes (Signed)
Called to room to assist during endoscopic procedure.  Patient ID and intended procedure confirmed with present staff. Received instructions for my participation in the procedure from the performing physician.  

## 2019-12-31 ENCOUNTER — Other Ambulatory Visit: Payer: Self-pay

## 2019-12-31 ENCOUNTER — Encounter: Payer: Self-pay | Admitting: Advanced Practice Midwife

## 2019-12-31 ENCOUNTER — Ambulatory Visit: Payer: Self-pay | Admitting: Advanced Practice Midwife

## 2019-12-31 ENCOUNTER — Ambulatory Visit
Admission: RE | Admit: 2019-12-31 | Discharge: 2019-12-31 | Disposition: A | Discharge status: Home / Self Care | Payer: No Typology Code available for payment source | Source: Ambulatory Visit | Attending: Obstetrics and Gynecology | Admitting: Obstetrics and Gynecology

## 2019-12-31 VITALS — BP 144/84 | Temp 97.8°F | Wt 174.0 lb

## 2019-12-31 DIAGNOSIS — Z1239 Encounter for other screening for malignant neoplasm of breast: Secondary | ICD-10-CM

## 2019-12-31 DIAGNOSIS — Z1231 Encounter for screening mammogram for malignant neoplasm of breast: Secondary | ICD-10-CM

## 2019-12-31 NOTE — Progress Notes (Signed)
Ms. Omera Addeo is a 59 y.o. female who presents to [facility] clinic today with no complaints.    Pap Smear: Pap not smear completed today. Last Pap smear was 09/28/2017 at [facility] clinic and was normal. Per patient has no history of an abnormal Pap smear. Last Pap smear result is available in Epic.   Physical exam: Breasts Breasts symmetrical. No skin abnormalities bilateral breasts. No nipple retraction bilateral breasts. No nipple discharge bilateral breasts. No lymphadenopathy. No lumps palpated bilateral breasts.       Pelvic/Bimanual Pap is not indicated today    Smoking History: Patient has is a former smoker. Quit in 2005    Patient Navigation: Patient education provided. Access to services provided for patient through [facility] program. Spanish interpreter provided.   Colorectal Cancer Screening: Per patient has had colonoscopy completed on 2017 No complaints today.    Breast and Cervical Cancer Risk Assessment: Patient does not have family history of breast cancer, known genetic mutations, or radiation treatment to the chest before age 44. Patient does not have history of cervical dysplasia, immunocompromised, or DES exposure in-utero.  Risk Assessment    Risk Scores      12/31/2019   Last edited by: Demetrius Revel, LPN   5-year risk: 0.8 %   Lifetime risk: 4.5 %          A: BCCCP exam without pap smear   P: Referred patient to the [facility] for a screening mammogram. Appointment scheduled 12/31/2019 at 1510.  Marcille Buffy DNP, CNM  12/31/19  1:32 PM

## 2020-01-01 ENCOUNTER — Telehealth: Payer: Self-pay | Admitting: *Deleted

## 2020-01-01 ENCOUNTER — Telehealth: Payer: Self-pay

## 2020-01-01 NOTE — Telephone Encounter (Signed)
LVM

## 2020-01-01 NOTE — Telephone Encounter (Signed)
  Follow up Call-  Call back number 12/30/2019 05/09/2018  Post procedure Call Back phone  # [phone]  Permission to leave phone message Yes Yes  Some recent data might be hidden   LMOM  LMOM to call back with any questions or concerns.  Also, call back if patient has developed fever, respiratory issues or been dx with COVID or had any family members or close contacts diagnosed since her procedure.

## 2020-01-02 ENCOUNTER — Ambulatory Visit (HOSPITAL_COMMUNITY)
Admission: RE | Admit: 2020-01-02 | Discharge: 2020-01-02 | Disposition: A | Discharge status: Home / Self Care | Payer: No Typology Code available for payment source | Source: Ambulatory Visit | Attending: Obstetrics and Gynecology | Admitting: Obstetrics and Gynecology

## 2020-01-02 ENCOUNTER — Other Ambulatory Visit: Payer: Self-pay

## 2020-01-02 DIAGNOSIS — N9489 Other specified conditions associated with female genital organs and menstrual cycle: Secondary | ICD-10-CM | POA: Insufficient documentation

## 2020-01-03 ENCOUNTER — Telehealth (INDEPENDENT_AMBULATORY_CARE_PROVIDER_SITE_OTHER): Payer: Self-pay | Admitting: Lactation Services

## 2020-01-03 ENCOUNTER — Telehealth: Payer: Self-pay | Admitting: Obstetrics and Gynecology

## 2020-01-03 DIAGNOSIS — N950 Postmenopausal bleeding: Secondary | ICD-10-CM

## 2020-01-03 NOTE — Telephone Encounter (Signed)
Called the patient to inform of the upcoming visit. Left a detailed voicemail message and also mailing a letter with an appointment reminder as the appointment is requested by the nurse for u/s results.

## 2020-01-03 NOTE — Telephone Encounter (Signed)
Called patient with assistance of [place] # [phone]. Informed patient that Dr. Elly Modena would like to have a follow up appointment to discuss the results with patient. Discussed it may be a telephone or Virtual appointment, patient reports she prefers telephone. Patient with no questions or concerns at this time.   Message to front office to call patient to schedule follow up appt.

## 2020-01-03 NOTE — Telephone Encounter (Signed)
-----   Message from Mora Bellman, MD sent at 01/03/2020 10:52 AM EST ----- Please schedule follow up appointment with me to discuss results of ultrasound. This can be a virtual appointment.  Thanks  [employer]

## 2020-01-06 ENCOUNTER — Encounter: Payer: Self-pay | Admitting: Gastroenterology

## 2020-01-09 ENCOUNTER — Encounter: Payer: Self-pay | Admitting: General Practice

## 2020-01-10 ENCOUNTER — Telehealth (INDEPENDENT_AMBULATORY_CARE_PROVIDER_SITE_OTHER): Payer: No Typology Code available for payment source | Admitting: Obstetrics and Gynecology

## 2020-01-10 ENCOUNTER — Telehealth: Payer: No Typology Code available for payment source | Admitting: Obstetrics and Gynecology

## 2020-01-10 DIAGNOSIS — Z712 Person consulting for explanation of examination or test findings: Secondary | ICD-10-CM

## 2020-01-10 NOTE — Progress Notes (Signed)
TELEHEALTH GYNECOLOGY VIRTUAL VIDEO VISIT ENCOUNTER NOTE  Provider location: Center for [facility] at [place]   I connected with Susan Lopez on 01/10/20 at  8:15 AM EST by MyChart Video Encounter at home and verified that I am speaking with the correct person using two identifiers.   I discussed the limitations, risks, security and privacy concerns of performing an evaluation and management service virtually and the availability of in person appointments. I also discussed with the patient that there may be a patient responsible charge related to this service. The patient expressed understanding and agreed to proceed.   History:  Susan Lopez is a 58 y.o. [phone] female being evaluated today to discuss results of recent pelvic ultrasound. She denies any abnormal vaginal discharge, bleeding, pelvic pain or other concerns. Patient reports occasional left lower quadrant pain which radiates down her left leg.       Past Medical History:  Diagnosis Date  . Allergy   . Anxiety   . Arthritis   . Bone lesion 10/04/2013  . Calculus of gallbladder with acute and chronic cholecystitis without obstruction, s/p lap chole 27Dec2012 09/19/2011  . Cataract 2009   bilateral  . Colon polyps   . Complication of anesthesia   . Depression   . Gallstones   . GERD (gastroesophageal reflux disease)   . Hemorrhoids, external 10-21-11   occ. bothersome  . Hyperlipidemia   . Hypertension   . Iron deficiency anemia   . Mammogram abnormal 10/04/2013  . PONV (postoperative nausea and vomiting)   . Sleep apnea    Past Surgical History:  Procedure Laterality Date  . [place]  . CHOLECYSTECTOMY  10/27/2011   Procedure: LAPAROSCOPIC CHOLECYSTECTOMY WITH INTRAOPERATIVE CHOLANGIOGRAM;  Surgeon: Adin Hector, MD;  Location: [facility];  Service: General;  Laterality: N/A;  Laparoscopic Chole w/ IOC Single Site  . COLONOSCOPY  2019  . EYE SURGERY  1`12-21-10   bil. for tissue growth-laser surgery  . HARDWARE REMOVAL Left 03/16/2016   Procedure: LEFT ANKLE HARDWARE REMOVAL;  Surgeon: Leandrew Koyanagi, MD;  Location: [facility];  Service: Orthopedics;  Laterality: Left;  Marland Kitchen MM BREAST STEREO BIOPSY LEFT ([place] HX) Left 2015  . ORIF ANKLE FRACTURE Left 11/13/2015   Procedure: OPEN REDUCTION INTERNAL FIXATION (ORIF) LEFT BIMALLEOLAR ANKLE FRACTURE;  Surgeon: Leandrew Koyanagi, MD;  Location: [facility];  Service: Orthopedics;  Laterality: Left;  . uterine biopsy     The following portions of the patient's history were reviewed and updated as appropriate: allergies, current medications, past family history, past medical history, past social history, past surgical history and problem list.   Health Maintenance:  Normal pap and negative HRHPV in 2018.  Normal mammogram on 12/31/2019.   Review of Systems:  Pertinent items noted in HPI and remainder of comprehensive ROS otherwise negative.  Physical Exam:   General:  Alert, oriented and cooperative. Patient appears to be in no acute distress.  Mental Status: Normal mood and affect. Normal behavior. Normal judgment and thought content.   Respiratory: Normal respiratory effort, no problems with respiration noted  Rest of physical exam deferred due to type of encounter  Labs and Imaging No results found for this or any previous visit (from the past 336 hour(s)). US PELVIS TRANSVAGINAL NON-OB (TV ONLY)  Result Date: 01/02/2020 CLINICAL DATA:  Uterine mass, endometrial mass, follow-up, postmenopausal EXAM: ULTRASOUND PELVIS TRANSVAGINAL TECHNIQUE: Transvaginal ultrasound examination of the pelvis was performed including evaluation  of the uterus, ovaries, adnexal regions, and pelvic cul-de-sac. COMPARISON:  10/18/2019 FINDINGS: Uterus Measurements: 7.4 x 3.7 x 5.1 cm = volume: 73 mL. Anteverted and anteflexed. Mildly heterogeneous myometrium. No focal myometrial mass. Endometrium Thickness: 4 mm thick. Abnormal  appearance again demonstrating an endometrial mass extending to LEFT measuring 2.9 x 1.6 x 2.0 cm in size. No definite internal blood flow is confirmed on color Doppler imaging. Lesion remains suspicious for tumor. No endometrial fluid or calcification. Right ovary Not visualized, question atrophic versus obscured by bowel Left ovary Not visualized, question atrophic versus obscured by bowel Other findings:  No abnormal free fluid IMPRESSION: Persistent visualization of an endometrial mass measuring 2.9 x 1.6 x 2.0 cm in size. Nondiagnostic endometrial biopsy. Recommend either sonohysterography evaluation or hysteroscopy with biopsy for further assessment. These results will be called to the ordering clinician or representative by the Radiologist Assistant, and communication documented in the PACS or zVision Dashboard. Electronically Signed   By: Lavonia Dana M.D.   On: 01/02/2020 17:19   MS DIGITAL SCREENING TOMO BILATERAL  Result Date: 01/01/2020 CLINICAL DATA:  Screening. EXAM: DIGITAL SCREENING BILATERAL MAMMOGRAM WITH TOMO AND CAD COMPARISON:  Previous exam(s). ACR Breast Density Category b: There are scattered areas of fibroglandular density. FINDINGS: There are no findings suspicious for malignancy. Images were processed with CAD. IMPRESSION: No mammographic evidence of malignancy. A result letter of this screening mammogram will be mailed directly to the patient. RECOMMENDATION: Screening mammogram in one year. (Code:SM-B-01Y) BI-RADS CATEGORY  1: Negative. Electronically Signed   By: Kristopher Oppenheim M.D.   On: 01/01/2020 12:58       Assessment and Plan:     Postmenopausal endometrial mass - Ultrasound report reviewed with the patient - Discussed further investigation of this unchanged complex endometrial mass with a D&C hysteroscopy - Risks, benefits and alternatives were explained including but not limited to risks of bleeding, infection, uterine perforation and damage to adjacent organs. Patient  verbalized understanding and all questions were answered      I discussed the assessment and treatment plan with the patient. The patient was provided an opportunity to ask questions and all were answered. The patient agreed with the plan and demonstrated an understanding of the instructions.   The patient was advised to call back or seek an in-person evaluation/go to the ED if the symptoms worsen or if the condition fails to improve as anticipated.  I provided 15 minutes of face-to-face time during this encounter.   Mora Bellman, MD [facility] for [facility]

## 2020-01-10 NOTE — Progress Notes (Signed)
[place] # [phone]

## 2020-01-17 ENCOUNTER — Other Ambulatory Visit: Payer: Self-pay | Admitting: Family Medicine

## 2020-01-17 DIAGNOSIS — E785 Hyperlipidemia, unspecified: Secondary | ICD-10-CM

## 2020-01-17 DIAGNOSIS — I10 Essential (primary) hypertension: Secondary | ICD-10-CM

## 2020-01-17 DIAGNOSIS — K219 Gastro-esophageal reflux disease without esophagitis: Secondary | ICD-10-CM

## 2020-01-17 DIAGNOSIS — G8929 Other chronic pain: Secondary | ICD-10-CM

## 2020-01-17 DIAGNOSIS — M546 Pain in thoracic spine: Secondary | ICD-10-CM

## 2020-01-17 MED FILL — GABAPENTIN 100 MG CAPSULE: 100 | 30 days supply | Qty: 60 | Fill #3

## 2020-01-17 MED FILL — OMEPRAZOLE DR 40 MG CAPSULE: 40 | 30 days supply | Qty: 60 | Fill #0

## 2020-01-17 MED FILL — ?ATORVASTATIN 20 MG TABLET: 20 | 30 days supply | Qty: 30 | Fill #0

## 2020-01-17 MED FILL — MELOXICAM 7.5 MG TABLET: 7.5 | 30 days supply | Qty: 30 | Fill #0

## 2020-01-17 MED FILL — LISINOPRIL 10 MG TABS: 10 | 30 days supply | Qty: 30 | Fill #0

## 2020-01-20 ENCOUNTER — Ambulatory Visit: Payer: No Typology Code available for payment source

## 2020-01-20 ENCOUNTER — Other Ambulatory Visit: Payer: Self-pay

## 2020-02-13 ENCOUNTER — Ambulatory Visit: Payer: No Typology Code available for payment source | Admitting: Family Medicine

## 2020-02-21 MED FILL — LISINOPRIL 10 MG TABS: 10 | 30 days supply | Qty: 30 | Fill #1

## 2020-02-21 MED FILL — MELOXICAM 7.5 MG TABLET: 7.5 | 30 days supply | Qty: 30 | Fill #1

## 2020-02-21 MED FILL — OMEPRAZOLE DR 40 MG CAPSULE: 40 | 30 days supply | Qty: 60 | Fill #1

## 2020-03-05 ENCOUNTER — Encounter (HOSPITAL_BASED_OUTPATIENT_CLINIC_OR_DEPARTMENT_OTHER): Payer: Self-pay | Admitting: Obstetrics and Gynecology

## 2020-03-05 ENCOUNTER — Other Ambulatory Visit: Payer: Self-pay

## 2020-03-05 NOTE — Progress Notes (Signed)
I have sent an e-mail to interpreting services requesting interpreter for the DOS.

## 2020-03-07 ENCOUNTER — Other Ambulatory Visit (HOSPITAL_COMMUNITY)
Admission: RE | Admit: 2020-03-07 | Discharge: 2020-03-07 | Disposition: A | Discharge status: Home / Self Care | Payer: HRSA Program | Source: Ambulatory Visit | Attending: Obstetrics and Gynecology | Admitting: Obstetrics and Gynecology

## 2020-03-07 DIAGNOSIS — Z01812 Encounter for preprocedural laboratory examination: Secondary | ICD-10-CM | POA: Insufficient documentation

## 2020-03-07 DIAGNOSIS — Z20822 Contact with and (suspected) exposure to covid-19: Secondary | ICD-10-CM | POA: Diagnosis not present

## 2020-03-07 LAB — SARS CORONAVIRUS 2 (TAT 6-24 HRS): SARS Coronavirus 2: NEGATIVE

## 2020-03-09 ENCOUNTER — Encounter (HOSPITAL_BASED_OUTPATIENT_CLINIC_OR_DEPARTMENT_OTHER)
Admission: RE | Admit: 2020-03-09 | Discharge: 2020-03-09 | Disposition: A | Discharge status: Home / Self Care | Payer: Self-pay | Source: Ambulatory Visit | Attending: Obstetrics and Gynecology | Admitting: Obstetrics and Gynecology

## 2020-03-09 DIAGNOSIS — Z01818 Encounter for other preprocedural examination: Secondary | ICD-10-CM | POA: Insufficient documentation

## 2020-03-09 DIAGNOSIS — R9431 Abnormal electrocardiogram [ECG] [EKG]: Secondary | ICD-10-CM | POA: Insufficient documentation

## 2020-03-09 LAB — CBC
HCT: 44.6 % (ref 36.0–46.0)
Hemoglobin: 14.6 g/dL (ref 12.0–15.0)
MCH: 31.1 pg (ref 26.0–34.0)
MCHC: 32.7 g/dL (ref 30.0–36.0)
MCV: 94.9 fL (ref 80.0–100.0)
Platelets: 236 10*3/uL (ref 150–400)
RBC: 4.7 MIL/uL (ref 3.87–5.11)
RDW: 12.6 % (ref 11.5–15.5)
WBC: 7 10*3/uL (ref 4.0–10.5)
nRBC: 0 % (ref 0.0–0.2)

## 2020-03-09 NOTE — Progress Notes (Signed)

## 2020-03-10 NOTE — H&P (Signed)
Susan Lopez is an 59 y.o. female postmenopausal presenting today for scheduled dilatation and curretage with hysteroscopy. Patient was initially seen for abdominal pain and pelvic ultrasound was ordered. An irregular endometrial mass was found on ultrasound. An endometrial biopsy was performed and found to be inconclusive. Patient here for further investigation of this mass. Patient is without any complaints and denies any history of postmenopausal vaginal bleeding or abnormal discharge  Pertinent Gynecological History: Menses: post-menopausal Last mammogram: normal Date: 2018 Last pap: normal Date: 08/2017   Menstrual History: Patient's last menstrual period was 10/08/2015.    Past Medical History:  Diagnosis Date  . Allergy   . Anxiety   . Arthritis   . Bone lesion 10/04/2013  . Calculus of gallbladder with acute and chronic cholecystitis without obstruction, s/p lap chole 27Dec2012 09/19/2011  . Cataract 2009   bilateral  . Colon polyps   . Complication of anesthesia   . Depression   . Gallstones   . GERD (gastroesophageal reflux disease)   . Hemorrhoids, external 10-21-11   occ. bothersome  . Hyperlipidemia   . Hypertension   . Iron deficiency anemia   . Mammogram abnormal 10/04/2013  . PONV (postoperative nausea and vomiting)   . Sleep apnea     Past Surgical History:  Procedure Laterality Date  . [place]  . CHOLECYSTECTOMY  10/27/2011   Procedure: LAPAROSCOPIC CHOLECYSTECTOMY WITH INTRAOPERATIVE CHOLANGIOGRAM;  Surgeon: Adin Hector, MD;  Location: [facility];  Service: General;  Laterality: N/A;  Laparoscopic Chole w/ IOC Single Site  . COLONOSCOPY  2019  . DRUG INDUCED ENDOSCOPY    . EYE SURGERY  1`12-21-10   bil. for tissue growth-laser surgery  . HARDWARE REMOVAL Left 03/16/2016   Procedure: LEFT ANKLE HARDWARE REMOVAL;  Surgeon: Leandrew Koyanagi, MD;  Location: [facility];  Service: Orthopedics;  Laterality: Left;  Marland Kitchen MM  BREAST STEREO BIOPSY LEFT ([place] HX) Left 2015  . ORIF ANKLE FRACTURE Left 11/13/2015   Procedure: OPEN REDUCTION INTERNAL FIXATION (ORIF) LEFT BIMALLEOLAR ANKLE FRACTURE;  Surgeon: Leandrew Koyanagi, MD;  Location: [facility];  Service: Orthopedics;  Laterality: Left;  . uterine biopsy      Family History  Problem Relation Age of Onset  . Hypertension Mother   . Cancer Mother   . Hypertension Father   . Arthritis Brother   . Hypertension Brother   . Intellectual disability Paternal Aunt   . Colon cancer Neg Hx     Social History:  reports that she quit smoking about 15 years ago. She has a 2.50 pack-year smoking history. She has never used smokeless tobacco. She reports that she does not drink alcohol or use drugs.  Allergies:  Allergies  Allergen Reactions  . Hydrocodone Nausea And Vomiting  . Tramadol Nausea Only  . Oxycodone Rash    Medications Prior to Admission  Medication Sig Dispense Refill Last Dose  . aspirin EC 325 MG tablet Take 1 tablet (325 mg total) by mouth 2 (two) times daily. (Patient taking differently: Take 325 mg by mouth daily. ) 84 tablet 0 Past Week at Unknown time  . atorvastatin (LIPITOR) 20 MG tablet TAKE 1 TABLET BY MOUTH DAILY. 30 tablet 2 Past Week at Unknown time  . clotrimazole-betamethasone (LOTRISONE) cream Apply 1 application topically 2 (two) times daily. As needed for skin irritation 30 g 2 Past Week at Unknown time  . gabapentin (NEURONTIN) 100 MG capsule TAKE 1 CAPSULE BY MOUTH 2 TIMES  DAILY. (Patient taking differently: 100 mg daily. ) 60 capsule 3 Past Week at Unknown time  . lisinopril (ZESTRIL) 10 MG tablet TAKE 1 TABLET BY MOUTH DAILY. 30 tablet 2 03/11/2020 at 0600  . meloxicam (MOBIC) 7.5 MG tablet TAKE 1 TABLET BY MOUTH DAILY. 30 tablet 2 Past Week at Unknown time  . Multiple Vitamin (MULTIVITAMIN WITH MINERALS) TABS tablet Take 1 tablet by mouth daily.   Past Week at Unknown time  . omeprazole (PRILOSEC) 40 MG capsule TAKE 1 CAPSULE BY MOUTH  DAILY. 30 capsule 2 Past Week at Unknown time  . polyethylene glycol powder (GLYCOLAX/MIRALAX) powder Take 17 g by mouth daily. As needed for constipation relief 3350 g 11 Past Month at Unknown time  . Probiotic Product (PROBIOTIC PO) Take by mouth daily.   Past Week at Unknown time  . psyllium (METAMUCIL) 58.6 % powder Take 1 packet by mouth as needed. 2 x weekly   Past Month at Unknown time    Review of Systems See pertinent in HPI.  Blood pressure 130/76, pulse 88, temperature (!) 97.1 F (36.2 C), temperature source Oral, resp. rate 20, height 5\' 2"  (1.575 m), weight 77.3 kg, last menstrual period 10/08/2015, SpO2 100 %. Physical Exam GENERAL: Well-developed, well-nourished female in no acute distress.  LUNGS: Clear to auscultation bilaterally.  HEART: Regular rate and rhythm. ABDOMEN: Soft, nontender, nondistended. No organomegaly. PELVIC: Deferred to OR EXTREMITIES: No cyanosis, clubbing, or edema, 2+ distal pulses.  No results found for this or any previous visit (from the past 24 hour(s)).  No results found. 10/2019 ultrasound FINDINGS: Uterus  Measurements: 7.2 x 4.3 x 4.7 cm = volume: 75 mL. Anteverted. Anterior wall Caesarean section scar. No definite myometrial mass.  Endometrium  Thickness: 23 mm. Heterogeneous thickening of the endometrial complex, appears to represent an irregular mass 3.2 x 2.1 x 2.7 cm in size. Mass demonstrates internal blood flow on color Doppler imaging. Finding is concerning for neoplasm. No definite endometrial fluid  Right ovary  Not visualized on either transabdominal or endovaginal imaging, likely obscured by bowel  Left ovary  Not visualized on either transabdominal or endovaginal imaging, likely obscured by bowel  Other findings  No free pelvic fluid or adnexal masses.  IMPRESSION: Mass-like enlargement of the endometrial complex measuring 3.2 x 2.1 x 2.7 cm concerning for an endometrial  neoplasm.  Endometrial sampling recommended to exclude carcinoma.   Electronically Signed By: Lavonia Dana M.D. On: 10/18/2019 17:07  11/2019 endometrial biopsy FINAL MICROSCOPIC DIAGNOSIS:   A. ENDOMETRIUM, BIOPSY:  - Strips of inactive endometrium.  - Scant benign squamous epithelium.  - Adipose tissue present, see comment.   Assessment/Plan: 59 yo postmenopausal with complex endometrial mass here for D&C hysteroscopy - Risks, benefits and alternatives were explained including but not limited to risks of bleeding, infection, uterine perforation and damage to adjacent organs.  - Patient verbalized understanding and all questions were answered  Bohdan Macho 03/11/2020, 12:24 PM

## 2020-03-11 ENCOUNTER — Encounter (HOSPITAL_BASED_OUTPATIENT_CLINIC_OR_DEPARTMENT_OTHER)
Admission: RE | Disposition: A | Discharge status: Home / Self Care | Payer: Self-pay | Source: Home / Self Care | Attending: Obstetrics and Gynecology

## 2020-03-11 ENCOUNTER — Ambulatory Visit (HOSPITAL_BASED_OUTPATIENT_CLINIC_OR_DEPARTMENT_OTHER): Payer: Self-pay | Admitting: Certified Registered"

## 2020-03-11 ENCOUNTER — Other Ambulatory Visit: Payer: Self-pay

## 2020-03-11 ENCOUNTER — Encounter (HOSPITAL_BASED_OUTPATIENT_CLINIC_OR_DEPARTMENT_OTHER): Payer: Self-pay | Admitting: Obstetrics and Gynecology

## 2020-03-11 ENCOUNTER — Ambulatory Visit (HOSPITAL_BASED_OUTPATIENT_CLINIC_OR_DEPARTMENT_OTHER)
Admission: RE | Admit: 2020-03-11 | Discharge: 2020-03-11 | Disposition: A | Discharge status: Home / Self Care | Payer: Self-pay | Attending: Obstetrics and Gynecology | Admitting: Obstetrics and Gynecology

## 2020-03-11 DIAGNOSIS — F419 Anxiety disorder, unspecified: Secondary | ICD-10-CM | POA: Insufficient documentation

## 2020-03-11 DIAGNOSIS — N9489 Other specified conditions associated with female genital organs and menstrual cycle: Secondary | ICD-10-CM

## 2020-03-11 DIAGNOSIS — Z888 Allergy status to other drugs, medicaments and biological substances status: Secondary | ICD-10-CM | POA: Insufficient documentation

## 2020-03-11 DIAGNOSIS — Z885 Allergy status to narcotic agent status: Secondary | ICD-10-CM | POA: Insufficient documentation

## 2020-03-11 DIAGNOSIS — K219 Gastro-esophageal reflux disease without esophagitis: Secondary | ICD-10-CM | POA: Insufficient documentation

## 2020-03-11 DIAGNOSIS — Z791 Long term (current) use of non-steroidal anti-inflammatories (NSAID): Secondary | ICD-10-CM | POA: Insufficient documentation

## 2020-03-11 DIAGNOSIS — N840 Polyp of corpus uteri: Secondary | ICD-10-CM | POA: Insufficient documentation

## 2020-03-11 DIAGNOSIS — Z79899 Other long term (current) drug therapy: Secondary | ICD-10-CM | POA: Insufficient documentation

## 2020-03-11 DIAGNOSIS — Z810 Family history of intellectual disabilities: Secondary | ICD-10-CM | POA: Insufficient documentation

## 2020-03-11 DIAGNOSIS — M1990 Unspecified osteoarthritis, unspecified site: Secondary | ICD-10-CM | POA: Insufficient documentation

## 2020-03-11 DIAGNOSIS — Z87891 Personal history of nicotine dependence: Secondary | ICD-10-CM | POA: Insufficient documentation

## 2020-03-11 DIAGNOSIS — G4730 Sleep apnea, unspecified: Secondary | ICD-10-CM | POA: Insufficient documentation

## 2020-03-11 DIAGNOSIS — N858 Other specified noninflammatory disorders of uterus: Secondary | ICD-10-CM

## 2020-03-11 DIAGNOSIS — I10 Essential (primary) hypertension: Secondary | ICD-10-CM | POA: Insufficient documentation

## 2020-03-11 DIAGNOSIS — Z7982 Long term (current) use of aspirin: Secondary | ICD-10-CM | POA: Insufficient documentation

## 2020-03-11 DIAGNOSIS — Z8249 Family history of ischemic heart disease and other diseases of the circulatory system: Secondary | ICD-10-CM | POA: Insufficient documentation

## 2020-03-11 DIAGNOSIS — Z86010 Personal history of colonic polyps: Secondary | ICD-10-CM | POA: Insufficient documentation

## 2020-03-11 DIAGNOSIS — E785 Hyperlipidemia, unspecified: Secondary | ICD-10-CM | POA: Insufficient documentation

## 2020-03-11 DIAGNOSIS — Z8261 Family history of arthritis: Secondary | ICD-10-CM | POA: Insufficient documentation

## 2020-03-11 DIAGNOSIS — F329 Major depressive disorder, single episode, unspecified: Secondary | ICD-10-CM | POA: Insufficient documentation

## 2020-03-11 DIAGNOSIS — Z9049 Acquired absence of other specified parts of digestive tract: Secondary | ICD-10-CM | POA: Insufficient documentation

## 2020-03-11 DIAGNOSIS — Z809 Family history of malignant neoplasm, unspecified: Secondary | ICD-10-CM | POA: Insufficient documentation

## 2020-03-11 HISTORY — PX: DILATATION & CURETTAGE/HYSTEROSCOPY WITH MYOSURE: SHX6511

## 2020-03-11 SURGERY — DILATATION & CURETTAGE/HYSTEROSCOPY WITH MYOSURE
Anesthesia: General | Site: Vagina

## 2020-03-11 MED ORDER — KETOROLAC TROMETHAMINE 15 MG/ML IJ SOLN
INTRAMUSCULAR | Status: DC | PRN
  Administered 2020-03-11: 30 mg via INTRAVENOUS

## 2020-03-11 MED ORDER — DEXAMETHASONE SODIUM PHOSPHATE 4 MG/ML IJ SOLN
INTRAMUSCULAR | Status: DC | PRN
  Administered 2020-03-11: 4 mg via INTRAVENOUS

## 2020-03-11 MED ORDER — ACETAMINOPHEN 10 MG/ML IV SOLN
INTRAVENOUS | Status: AC
  Filled 2020-03-11: qty 100

## 2020-03-11 MED ORDER — PROPOFOL 10 MG/ML IV BOLUS
INTRAVENOUS | Status: AC
  Filled 2020-03-11: qty 20

## 2020-03-11 MED ORDER — ONDANSETRON HCL 4 MG/2ML IJ SOLN: 4.0000 mg | Freq: Once | INTRAMUSCULAR | Status: DC | PRN

## 2020-03-11 MED ORDER — PHENYLEPHRINE HCL (PRESSORS) 10 MG/ML IV SOLN
INTRAVENOUS | Status: DC | PRN
Start: 2020-03-11 — End: 2020-03-11
  Administered 2020-03-11: 80 ug via INTRAVENOUS

## 2020-03-11 MED ORDER — LIDOCAINE 2% (20 MG/ML) 5 ML SYRINGE
INTRAMUSCULAR | Status: DC | PRN
  Administered 2020-03-11: 30 mg via INTRAVENOUS

## 2020-03-11 MED ORDER — MIDAZOLAM HCL 2 MG/2ML IJ SOLN
INTRAMUSCULAR | Status: AC
  Filled 2020-03-11: qty 2

## 2020-03-11 MED ORDER — SILVER NITRATE-POT NITRATE 75-25 % EX MISC
CUTANEOUS | Status: AC
  Filled 2020-03-11: qty 10

## 2020-03-11 MED ORDER — FENTANYL CITRATE (PF) 100 MCG/2ML IJ SOLN
INTRAMUSCULAR | Status: DC | PRN
  Administered 2020-03-11: 50 ug via INTRAVENOUS
  Administered 2020-03-11: 25 ug via INTRAVENOUS

## 2020-03-11 MED ORDER — SODIUM CHLORIDE 0.9 % IR SOLN
Status: DC | PRN
  Administered 2020-03-11: 1

## 2020-03-11 MED ORDER — FENTANYL CITRATE (PF) 100 MCG/2ML IJ SOLN
INTRAMUSCULAR | Status: AC
  Filled 2020-03-11: qty 2

## 2020-03-11 MED ORDER — ONDANSETRON HCL 4 MG/2ML IJ SOLN
INTRAMUSCULAR | Status: DC | PRN
  Administered 2020-03-11: 4 mg via INTRAVENOUS

## 2020-03-11 MED ORDER — FENTANYL CITRATE (PF) 100 MCG/2ML IJ SOLN
25.0000 ug | INTRAMUSCULAR | Status: DC | PRN
  Administered 2020-03-11: 14:00:00 50 ug via INTRAVENOUS

## 2020-03-11 MED ORDER — MIDAZOLAM HCL 5 MG/5ML IJ SOLN
INTRAMUSCULAR | Status: DC | PRN
  Administered 2020-03-11: 2 mg via INTRAVENOUS

## 2020-03-11 MED ORDER — OXYCODONE-ACETAMINOPHEN 5-325 MG PO TABS: 1.0000 | ORAL_TABLET | ORAL | 0 refills | Status: DC | PRN

## 2020-03-11 MED ORDER — CHLOROPROCAINE HCL 1 % IJ SOLN
INTRAMUSCULAR | Status: DC | PRN
  Administered 2020-03-11: 10 mL

## 2020-03-11 MED ORDER — LACTATED RINGERS IV SOLN: INTRAVENOUS | Status: DC

## 2020-03-11 MED ORDER — ACETAMINOPHEN 10 MG/ML IV SOLN
1000.0000 mg | Freq: Four times a day (QID) | INTRAVENOUS | Status: DC
  Administered 2020-03-11: 1000 mg via INTRAVENOUS

## 2020-03-11 MED ORDER — IBUPROFEN 600 MG PO TABS
600.0000 mg | ORAL_TABLET | Freq: Four times a day (QID) | ORAL | 3 refills | Status: DC | PRN
Start: 2020-03-11 — End: 2020-12-22

## 2020-03-11 MED ORDER — FENTANYL CITRATE (PF) 100 MCG/2ML IJ SOLN: 50.0000 ug | INTRAMUSCULAR | Status: DC | PRN

## 2020-03-11 MED ORDER — PROPOFOL 10 MG/ML IV BOLUS
INTRAVENOUS | Status: DC | PRN
  Administered 2020-03-11: 200 mg via INTRAVENOUS

## 2020-03-11 MED ORDER — MIDAZOLAM HCL 2 MG/2ML IJ SOLN: 1.0000 mg | INTRAMUSCULAR | Status: DC | PRN

## 2020-03-11 MED ORDER — CHLOROPROCAINE HCL 1 % IJ SOLN
INTRAMUSCULAR | Status: AC
  Filled 2020-03-11: qty 30

## 2020-03-11 SURGICAL SUPPLY — 17 items
CATH ROBINSON RED A/P 16FR (CATHETERS) ×4 IMPLANT
DEVICE MYOSURE LITE (MISCELLANEOUS) ×4 IMPLANT
GLOVE BIOGEL PI IND STRL 6.5 (GLOVE) ×2 IMPLANT
GLOVE BIOGEL PI IND STRL 7.0 (GLOVE) ×3 IMPLANT
GLOVE BIOGEL PI INDICATOR 6.5 (GLOVE) ×2
GLOVE BIOGEL PI INDICATOR 7.0 (GLOVE) ×4
GLOVE SURG SS PI 6.5 STRL IVOR (GLOVE) ×4 IMPLANT
GLOVE SURG SYN 7.5  E (GLOVE) ×4
GLOVE SURG SYN 7.5 E (GLOVE) ×2 IMPLANT
GLOVE SURG SYN 7.5 PF PI (GLOVE) IMPLANT
GOWN STRL REUS W/TWL LRG LVL3 (GOWN DISPOSABLE) ×8 IMPLANT
KIT PROCEDURE FLUENT (KITS) ×4 IMPLANT
PACK VAGINAL MINOR WOMEN LF (CUSTOM PROCEDURE TRAY) ×4 IMPLANT
PAD OB MATERNITY 4.3X12.25 (PERSONAL CARE ITEMS) ×4 IMPLANT
PAD PREP 24X48 CUFFED NSTRL (MISCELLANEOUS) ×4 IMPLANT
SLEEVE SCD COMPRESS KNEE MED (MISCELLANEOUS) ×4 IMPLANT
TOWEL GREEN STERILE FF (TOWEL DISPOSABLE) ×8 IMPLANT

## 2020-03-11 NOTE — Discharge Instructions (Signed)
Histeroscopa, cuidados posteriores Hysteroscopy, Care After Lea esta informacin sobre cmo cuidarse despus del procedimiento. Su mdico tambin podr darle indicaciones ms especficas. Comunquese con su mdico si tiene problemas o preguntas. Qu puedo esperar despus del procedimiento? Despus del procedimiento, es comn [employer] siguientes sntomas:  Calvin.  Hemorragia. Puede variar desde un leve goteo hasta un manchado similar al menstrual. Siga estas indicaciones en su casa: Actividad  Haga reposo [employer] primeros 1 a 2 das despus del procedimiento.  No se haga duchas vaginales, no use tampones ni tenga relaciones sexuales por 2semanas despus del procedimiento, o hasta que el mdico lo autorice.  No conduzca durante 24horas despus del procedimiento, o durante el tiempo que le haya indicado el mdico.  No conduzca, no use maquinaria pesada ni tome alcohol mientras toma analgsicos recetados. Medicamentos   [employer] de venta libre y los recetados solamente como se lo haya indicado el mdico.  No tome aspirina durante la recuperacin. Puede aumentar el riesgo de sangrado. Instrucciones generales  No tome baos de inmersin, no nade ni use el jacuzzi hasta que el mdico lo autorice. Tome duchas en lugar de baos durante 2semanas, o durante el tiempo que le haya indicado el mdico.  A fin de prevenir o tratar el estreimiento mientras toma analgsicos recetados, el mdico puede recomendarle lo siguiente: ? Electronics engineer suficiente lquido como para mantener la orina clara o de color amarillo plido. ? Tomar medicamentos recetados o de [employer]. ? Consumir alimentos ricos en fibra, como frutas y verduras frescas, cereales integrales y frijoles. ? Limitar el consumo de alimentos ricos en grasa y azcares procesados, como alimentos fritos o dulces.  Concurra a todas las visitas de seguimiento como se lo haya indicado el mdico. Esto es  importante. Comunquese con un mdico si:  Se siente mareado o sufre un desmayo.  Siente nuseas.  Tiene una secrecin vaginal anormal.  Tiene una erupcin cutnea.  El dolor no mejora con medicamentos.  Tiene escalofros. Solicite ayuda de inmediato si:  Tiene una hemorragia ms abundante que un perodo menstrual normal.  Tiene fiebre.  Siente dolor o clicos que empeoran.  Siente dolor abdominal.  Se desmaya.  Siente dolor en los hombros.  Le falta el aire. Resumen  Despus del procedimiento, es posible que tenga calambres y sangrado vaginal leve.  No se haga duchas vaginales, no use tampones ni tenga relaciones sexuales por [employer] despus del procedimiento, o hasta que el mdico lo autorice.  No tome baos de inmersin, no nade ni use el jacuzzi hasta que el mdico lo autorice. Tome duchas en lugar de baos durante 2semanas, o durante el tiempo que le haya indicado el mdico.  Infrmele al mdico si tiene sntomas fuera de lo comn.  Concurra a todas las visitas de seguimiento como se lo haya indicado el mdico. Esto es importante. Esta informacin no tiene Marine scientist el consejo del mdico. Asegrese de hacerle al mdico cualquier pregunta que tenga. Document Revised: 06/23/2017 Document Reviewed: 06/23/2017 Elsevier Patient Education  [place].   No ibuprofen until 9pm No Tylenol until 8:25pm  Post Anesthesia Home Care Instructions  Activity: Get plenty of rest for the remainder of the day. A responsible individual must stay with you for 24 hours following the procedure.  For the next 24 hours, DO NOT: -Drive a car -Paediatric nurse -Drink alcoholic beverages -Take any medication unless instructed by your physician -Make any legal decisions or sign important papers.  Meals: Start with  liquid foods such as gelatin or soup. Progress to regular foods as tolerated. Avoid greasy, spicy, heavy foods. If nausea and/or vomiting occur,  drink only clear liquids until the nausea and/or vomiting subsides. Call your physician if vomiting continues.  Special Instructions/Symptoms: Your throat may feel dry or sore from the anesthesia or the breathing tube placed in your throat during surgery. If this causes discomfort, gargle with warm salt water. The discomfort should disappear within 24 hours.  If you had a scopolamine patch placed behind your ear for the management of post- operative nausea and/or vomiting:  1. The medication in the patch is effective for 72 hours, after which it should be removed.  Wrap patch in a tissue and discard in the trash. Wash hands thoroughly with soap and water. 2. You may remove the patch earlier than 72 hours if you experience unpleasant side effects which may include dry mouth, dizziness or visual disturbances. 3. Avoid touching the patch. Wash your hands with soap and water after contact with the patch.

## 2020-03-11 NOTE — Transfer of Care (Signed)
Immediate Anesthesia Transfer of Care Note  Patient: Susan Lopez  Procedure(s) Performed: DILATATION AND CURETTAGE /HYSTEROSCOPY WITH MYOSURE (N/A Vagina )  Patient Location: PACU  Anesthesia Type:General  Level of Consciousness: awake, alert  and oriented  Airway & Oxygen Therapy: Patient Spontanous Breathing and Patient connected to face mask oxygen  Post-op Assessment: Report given to RN and Post -op Vital signs reviewed and stable  Post vital signs: Reviewed and stable  Last Vitals:  Vitals Value Taken Time  BP 118/75 03/11/20 1353  Temp    Pulse 85 03/11/20 1355  Resp 14 03/11/20 1355  SpO2 100 % 03/11/20 1355  Vitals shown include unvalidated device data.  Last Pain:  Vitals:   03/11/20 1222  TempSrc: Oral  PainSc: 0-No pain         Complications: No apparent anesthesia complications

## 2020-03-11 NOTE — Anesthesia Preprocedure Evaluation (Signed)
Anesthesia Evaluation  Patient identified by MRN, date of birth, ID band Patient awake    Reviewed: Allergy & Precautions, NPO status , Patient's Chart, lab work & pertinent test results  Airway Mallampati: II  TM Distance: >3 FB Neck ROM: Full    Dental  (+) Teeth Intact, Dental Advisory Given   Pulmonary former smoker,    breath sounds clear to auscultation       Cardiovascular hypertension,  Rhythm:Regular Rate:Normal     Neuro/Psych    GI/Hepatic   Endo/Other    Renal/GU      Musculoskeletal   Abdominal   Peds  Hematology   Anesthesia Other Findings   Reproductive/Obstetrics                             Anesthesia Physical Anesthesia Plan  ASA: II  Anesthesia Plan: General   Post-op Pain Management:    Induction: Intravenous  PONV Risk Score and Plan: Ondansetron and Dexamethasone  Airway Management Planned: LMA  Additional Equipment:   Intra-op Plan:   Post-operative Plan:   Informed Consent: I have reviewed the patients History and Physical, chart, labs and discussed the procedure including the risks, benefits and alternatives for the proposed anesthesia with the patient or authorized representative who has indicated his/her understanding and acceptance.     Dental advisory given  Plan Discussed with: CRNA and Anesthesiologist  Anesthesia Plan Comments:         Anesthesia Quick Evaluation

## 2020-03-11 NOTE — Op Note (Signed)
PREOPERATIVE DIAGNOSIS:  Complex endometrial mass in postmenopausal state. POSTOPERATIVE DIAGNOSIS: The same PROCEDURE: Hysteroscopy, Dilation and Curettage. SURGEON:  Dr. Mora Bellman   INDICATIONS: 59 y.o. [id number]  here for scheduled surgery for complex endometrial mass in postmenopausal state.   Risks of surgery were discussed with the patient including but not limited to: bleeding which may require transfusion; infection which may require antibiotics; injury to uterus or surrounding organs; intrauterine scarring which may impair future fertility; need for additional procedures including laparotomy or laparoscopy; and other postoperative/anesthesia complications. Written informed consent was obtained.    FINDINGS:  An 8-week size uterus.  Atrophic endometrium with 2 small endometrial polyps.  Normal ostia bilaterally.  ANESTHESIA:   General, paracervical block. INTRAVENOUS FLUIDS:  700 ml of LR FLUID DEFICITS:  135ml of normal saline ESTIMATED BLOOD LOSS:  Less than 20 ml SPECIMENS: Endometrial curettings and fragments of endometrial polyps sent to pathology COMPLICATIONS:  None immediate.  PROCEDURE DETAILS:  The patient received intravenous antibiotics while in the preoperative area.  She was then taken to the operating room where general anesthesia was administered and was found to be adequate.  After an adequate timeout was performed, she was placed in the dorsal lithotomy position and examined; then prepped and draped in the sterile manner.   Her bladder was catheterized for an unmeasured amount of clear, yellow urine. A speculum was then placed in the patient's vagina and a single tooth tenaculum was applied to the anterior lip of the cervix.   A paracervical block using 10 ml of 0.5% Marcaine was administered.  The cervix was sounded to 7 cm and dilated manually with Hagar dilators to accommodate the 5 mm diagnostic hysteroscope.  Once the cervix was dilated, the hysteroscope was  inserted under direct visualization using normal saline as a suspension medium.  The uterine cavity was carefully examined, both ostia were recognized, along with above noted findings.   After further careful visualization of the uterine cavity, the hysteroscope was removed under direct visualization.  A sharp curettage was then performed to obtain a moderate amount of endometrial curettings. The myosure was used to remove the polyps. The tenaculum was removed from the anterior lip of the cervix and the vaginal speculum was removed after noting good hemostasis.  The patient tolerated the procedure well and was taken to the recovery area awake, extubated and in stable condition.

## 2020-03-11 NOTE — Anesthesia Postprocedure Evaluation (Signed)
Anesthesia Post Note  Patient: Susan Lopez  Procedure(s) Performed: DILATATION AND CURETTAGE /HYSTEROSCOPY WITH MYOSURE (N/A Vagina )     Patient location during evaluation: PACU Anesthesia Type: General Level of consciousness: awake and alert Pain management: pain level controlled Vital Signs Assessment: post-procedure vital signs reviewed and stable Respiratory status: spontaneous breathing, nonlabored ventilation, respiratory function stable and patient connected to nasal cannula oxygen Cardiovascular status: blood pressure returned to baseline and stable Postop Assessment: no apparent nausea or vomiting Anesthetic complications: no    Last Vitals:  Vitals:   03/11/20 1415 03/11/20 1430  BP: (!) 109/58 (!) 93/56  Pulse: 69 63  Resp: 10 12  Temp:    SpO2: 100% 100%    Last Pain:  Vitals:   03/11/20 1430  TempSrc:   PainSc: 5                  Brendan Gadson COKER

## 2020-03-11 NOTE — Anesthesia Procedure Notes (Signed)
Procedure Name: LMA Insertion Date/Time: 03/11/2020 1:14 PM Performed by: Signe Colt, CRNA Pre-anesthesia Checklist: Patient identified, Emergency Drugs available, Suction available and Patient being monitored Patient Re-evaluated:Patient Re-evaluated prior to induction Oxygen Delivery Method: Circle system utilized Preoxygenation: Pre-oxygenation with 100% oxygen Induction Type: IV induction Ventilation: Mask ventilation without difficulty LMA: LMA inserted LMA Size: 4.0 Number of attempts: 1 Airway Equipment and Method: Bite block Placement Confirmation: positive ETCO2 Tube secured with: Tape Dental Injury: Teeth and Oropharynx as per pre-operative assessment

## 2020-03-12 ENCOUNTER — Encounter: Payer: Self-pay | Admitting: *Deleted

## 2020-03-12 LAB — SURGICAL PATHOLOGY

## 2020-04-01 ENCOUNTER — Other Ambulatory Visit: Payer: Self-pay

## 2020-04-01 ENCOUNTER — Encounter: Payer: Self-pay | Admitting: Obstetrics and Gynecology

## 2020-04-01 ENCOUNTER — Ambulatory Visit (HOSPITAL_BASED_OUTPATIENT_CLINIC_OR_DEPARTMENT_OTHER): Payer: Self-pay | Admitting: Obstetrics and Gynecology

## 2020-04-01 VITALS — BP 124/77 | HR 91 | Wt 172.8 lb

## 2020-04-01 DIAGNOSIS — N840 Polyp of corpus uteri: Secondary | ICD-10-CM

## 2020-04-01 DIAGNOSIS — Z9889 Other specified postprocedural states: Secondary | ICD-10-CM

## 2020-04-01 NOTE — Progress Notes (Signed)
Pt presenting today for post op check s/p D&C hysteroscopy on 5/12.  Pt reports intermittent bright red vaginal spotting

## 2020-04-01 NOTE — Progress Notes (Signed)
59 yo presenting today for post op check s/p D&C hysteroscopy on 5/12. Patient reports doing well and is without complaints. She denies pelvic pain or abnormal discharge.   Past Medical History:  Diagnosis Date  . Allergy   . Anxiety   . Arthritis   . Bone lesion 10/04/2013  . Calculus of gallbladder with acute and chronic cholecystitis without obstruction, s/p lap chole [id number] 09/19/2011  . Cataract 2009   bilateral  . Colon polyps   . Complication of anesthesia   . Depression   . Gallstones   . GERD (gastroesophageal reflux disease)   . Hemorrhoids, external 10-21-11   occ. bothersome  . Hyperlipidemia   . Hypertension   . Iron deficiency anemia   . Mammogram abnormal 10/04/2013  . PONV (postoperative nausea and vomiting)   . Sleep apnea    Past Surgical History:  Procedure Laterality Date  . [place]  . CHOLECYSTECTOMY  10/27/2011   Procedure: LAPAROSCOPIC CHOLECYSTECTOMY WITH INTRAOPERATIVE CHOLANGIOGRAM;  Surgeon: Adin Hector, MD;  Location: [facility];  Service: General;  Laterality: N/A;  Laparoscopic Chole w/ IOC Single Site  . COLONOSCOPY  2019  . DILATATION & CURETTAGE/HYSTEROSCOPY WITH MYOSURE N/A 03/11/2020   Procedure: DILATATION AND CURETTAGE /HYSTEROSCOPY WITH MYOSURE;  Surgeon: Mora Bellman, MD;  Location: [facility];  Service: Gynecology;  Laterality: N/A;  . DRUG INDUCED ENDOSCOPY    . EYE SURGERY  1`12-21-10   bil. for tissue growth-laser surgery  . HARDWARE REMOVAL Left 03/16/2016   Procedure: LEFT ANKLE HARDWARE REMOVAL;  Surgeon: Leandrew Koyanagi, MD;  Location: [facility];  Service: Orthopedics;  Laterality: Left;  Marland Kitchen MM BREAST STEREO BIOPSY LEFT ([place] HX) Left 2015  . ORIF ANKLE FRACTURE Left 11/13/2015   Procedure: OPEN REDUCTION INTERNAL FIXATION (ORIF) LEFT BIMALLEOLAR ANKLE FRACTURE;  Surgeon: Leandrew Koyanagi, MD;  Location: [facility];  Service: Orthopedics;  Laterality: Left;  . uterine biopsy     Family  History  Problem Relation Age of Onset  . Hypertension Mother   . Cancer Mother   . Hypertension Father   . Arthritis Brother   . Hypertension Brother   . Intellectual disability Paternal Aunt   . Colon cancer Neg Hx    Social History   Tobacco Use  . Smoking status: Former Smoker    Packs/day: 0.25    Years: 10.00    Pack years: 2.50    Quit date: 10/20/2004    Years since quitting: 15.4  . Smokeless tobacco: Never Used  Substance Use Topics  . Alcohol use: No  . Drug use: No   ROS See pertinent in HPI  Blood pressure 124/77, pulse 91, weight 172 lb 12.8 oz (78.4 kg), last menstrual period 10/08/2015.  GENERAL: Well-developed, well-nourished female in no acute distress.  ABDOMEN: Soft, nontender, nondistended. No organomegaly. PELVIC: Not performed EXTREMITIES: No cyanosis, clubbing, or edema, 2+ distal pulses.  A/P 59 yo here for post op check - Pathology reviewed with the patient which demonstrated benign polyp -  Patient cleared to resume all activities of daily living - RTC prn

## 2020-04-09 MED FILL — ?ATORVASTATIN 20 MG TABLET: 20 | 30 days supply | Qty: 30 | Fill #1

## 2020-05-18 ENCOUNTER — Other Ambulatory Visit: Payer: Self-pay | Admitting: Family

## 2020-05-18 ENCOUNTER — Other Ambulatory Visit: Payer: Self-pay

## 2020-05-18 ENCOUNTER — Encounter: Payer: Self-pay | Admitting: Family

## 2020-05-18 ENCOUNTER — Ambulatory Visit: Payer: Self-pay | Attending: Family | Admitting: Family

## 2020-05-18 VITALS — BP 142/86 | HR 71 | Temp 98.0°F | Resp 18 | Ht 62.0 in | Wt 170.0 lb

## 2020-05-18 DIAGNOSIS — Z789 Other specified health status: Secondary | ICD-10-CM

## 2020-05-18 DIAGNOSIS — G8929 Other chronic pain: Secondary | ICD-10-CM

## 2020-05-18 DIAGNOSIS — M546 Pain in thoracic spine: Secondary | ICD-10-CM

## 2020-05-18 MED ORDER — MELOXICAM 7.5 MG PO TABS: 7.5000 mg | ORAL_TABLET | Freq: Every day | ORAL | 3 refills | Status: DC

## 2020-05-18 MED ORDER — GABAPENTIN 100 MG PO CAPS: ORAL_CAPSULE | ORAL | 3 refills | Status: DC

## 2020-05-18 NOTE — Progress Notes (Signed)
Patient ID: Susan Lopez, female    DOB: [birth year]  MRN: [id number]  CC: BACK PAIN FOLLOW-UP  Subjective: Susan Lopez is a 59 y.o. female with history of essential hypertension, GERD, UTI, and chest pain who presents for back pain follow-up.  1. BACK PAIN FOLLOW-UP: Location: middle back Onset: 3 years  Description: 8/10 pain, stabbing pain, constant Radiation: right side of back  Symptoms Worse with: walking, especially when using right arm  Better with: Meloxicam and Gabapentin Trauma: fell from a ladder and landed on concrete (reports she landed on her buttocks) reports this happened 3 years ago  Popping/clicking sounds with movement/bending: sometimes Muscle spasms: sometimes   Red Flags Fecal/urinary incontinence: since removing tumors from uterus May 2021 some urine incontinence Weakness: yes  Fever/chills: denies  Night pain: denies No relief with bedrest: denies  Reports referred to Sports Medicine in the past and was referred to physical therapy.   Patient Active Problem List   Diagnosis Date Noted  . Endometrial mass   . Snoring 07/05/2017  . OSA (obstructive sleep apnea) 07/05/2017  . 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 Left eye injury 11/23/2016  . Urinary incontinence in female 05/19/2016  . Bimalleolar fracture of left ankle 11/13/2015  . S/P ORIF (open reduction internal fixation) fracture 11/13/2015  . Healthcare maintenance 10/08/2015  . Postmenopausal bleeding 10/08/2015  . Pain in joint, ankle and foot 10/08/2015  . Headache disorder 10/08/2015  . Atypical chest pain 10/08/2015  . Essential hypertension 10/08/2015  . Neck pain on right side 08/12/2015  . Grief reaction 03/09/2015  . Chronic thoracic spine pain 12/11/2014  . Depression (emotion) 08/12/2014  . Dyslipidemia 08/12/2014  . Headache(784.0) 07/17/2014  . UTI (urinary tract infection) 04/16/2014  . Cystocele with rectocele 04/16/2014  . Hyperlipidemia  LDL goal < 100 01/22/2014  . Breast lump on left side at 12 o'clock position 10/22/2013  . Chronic constipation 10/09/2013  . Bone lesion 10/04/2013  . Mammogram abnormal 10/04/2013  . Abdominal pain, unspecified site 10/04/2013  . Unspecified essential hypertension 06/10/2013  . Chest pain, unspecified 06/10/2013  . Chronic throat pain 06/10/2013  . Back pain 11/14/2011  . Obesity (BMI 30-39.9) 11/14/2011  . GERD (gastroesophageal reflux disease) 09/19/2011  . Calculus of gallbladder with acute and chronic cholecystitis without obstruction, s/p lap chole 27Dec2012 09/19/2011  . Hemorrhoids, internal, with bleeding 09/19/2011  . ANEMIA-IRON DEFICIENCY 03/19/2009  . CHEST PAIN, ATYPICAL 03/19/2009     Current Outpatient Medications on File Prior to Visit  Medication Sig Dispense Refill  . aspirin EC 325 MG tablet Take 1 tablet (325 mg total) by mouth 2 (two) times daily. (Patient not taking: Reported on 04/01/2020) 84 tablet 0  . atorvastatin (LIPITOR) 20 MG tablet TAKE 1 TABLET BY MOUTH DAILY. 30 tablet 2  . clotrimazole-betamethasone (LOTRISONE) cream Apply 1 application topically 2 (two) times daily. As needed for skin irritation (Patient not taking: Reported on 04/01/2020) 30 g 2  . gabapentin (NEURONTIN) 100 MG capsule TAKE 1 CAPSULE BY MOUTH 2 TIMES DAILY. (Patient taking differently: 100 mg daily. ) 60 capsule 3  . ibuprofen (ADVIL) 600 MG tablet Take 1 tablet (600 mg total) by mouth every 6 (six) hours as needed. (Patient not taking: Reported on 04/01/2020) 60 tablet 3  . lisinopril (ZESTRIL) 10 MG tablet TAKE 1 TABLET BY MOUTH DAILY. 30 tablet 2  . meloxicam (MOBIC) 7.5 MG tablet TAKE 1 TABLET BY MOUTH DAILY. 30 tablet 2  . Multiple  Vitamin (MULTIVITAMIN WITH MINERALS) TABS tablet Take 1 tablet by mouth daily.    Marland Kitchen omeprazole (PRILOSEC) 40 MG capsule TAKE 1 CAPSULE BY MOUTH DAILY. 30 capsule 2  . oxyCODONE-acetaminophen (PERCOCET/ROXICET) 5-325 MG tablet Take 1 tablet by mouth every 4  (four) hours as needed. (Patient not taking: Reported on 04/01/2020) 10 tablet 0  . polyethylene glycol powder (GLYCOLAX/MIRALAX) powder Take 17 g by mouth daily. As needed for constipation relief (Patient not taking: Reported on 04/01/2020) 3350 g 11  . Probiotic Product (PROBIOTIC PO) Take by mouth daily.    . psyllium (METAMUCIL) 58.6 % powder Take 1 packet by mouth as needed. 2 x weekly     No current facility-administered medications on file prior to visit.    Allergies  Allergen Reactions  . Hydrocodone Nausea And Vomiting  . Tramadol Nausea Only  . Oxycodone Rash    Social History   Socioeconomic History  . Marital status: Single    Spouse name: Not on file  . Number of children: 2  . Years of education: Not on file  . Highest education level: 4th grade  Occupational History  . Occupation: unemployed  Tobacco Use  . Smoking status: Former Smoker    Packs/day: 0.25    Years: 10.00    Pack years: 2.50    Quit date: 10/20/2004    Years since quitting: 15.5  . Smokeless tobacco: Never Used  Vaping Use  . Vaping Use: Never used  Substance and Sexual Activity  . Alcohol use: No  . Drug use: No  . Sexual activity: Not Currently    Birth control/protection: Post-menopausal  Other Topics Concern  . Not on file  Social History Narrative  . Not on file   Social Determinants of Health   Financial Resource Strain:   . Difficulty of Paying Living Expenses:   Food Insecurity:   . Worried About Charity fundraiser in the Last Year:   . Arboriculturist in the Last Year:   Transportation Needs: Unmet Transportation Needs  . Lack of Transportation (Medical): Yes  . Lack of Transportation (Non-Medical): Yes  Physical Activity:   . Days of Exercise per Week:   . Minutes of Exercise per Session:   Stress:   . Feeling of Stress :   Social Connections:   . Frequency of Communication with Friends and Family:   . Frequency of Social Gatherings with Friends and Family:   .  Attends Religious Services:   . Active Member of Clubs or Organizations:   . Attends Archivist Meetings:   Marland Kitchen Marital Status:   Intimate Partner Violence:   . Fear of Current or Ex-Partner:   . Emotionally Abused:   Marland Kitchen Physically Abused:   . Sexually Abused:     Family History  Problem Relation Age of Onset  . Hypertension Mother   . Cancer Mother   . Hypertension Father   . Arthritis Brother   . Hypertension Brother   . Intellectual disability Paternal Aunt   . Colon cancer Neg Hx     Past Surgical History:  Procedure Laterality Date  . [place]  . CHOLECYSTECTOMY  10/27/2011   Procedure: LAPAROSCOPIC CHOLECYSTECTOMY WITH INTRAOPERATIVE CHOLANGIOGRAM;  Surgeon: Adin Hector, MD;  Location: [facility];  Service: General;  Laterality: N/A;  Laparoscopic Chole w/ IOC Single Site  . COLONOSCOPY  2019  . DILATATION & CURETTAGE/HYSTEROSCOPY WITH MYOSURE N/A 03/11/2020   Procedure: DILATATION AND CURETTAGE /  HYSTEROSCOPY WITH MYOSURE;  Surgeon: Mora Bellman, MD;  Location: [place];  Service: Gynecology;  Laterality: N/A;  . DRUG INDUCED ENDOSCOPY    . EYE SURGERY  1`12-21-10   bil. for tissue growth-laser surgery  . HARDWARE REMOVAL Left 03/16/2016   Procedure: LEFT ANKLE HARDWARE REMOVAL;  Surgeon: Leandrew Koyanagi, MD;  Location: [place];  Service: Orthopedics;  Laterality: Left;  Marland Kitchen MM BREAST STEREO BIOPSY LEFT ([place] HX) Left 2015  . ORIF ANKLE FRACTURE Left 11/13/2015   Procedure: OPEN REDUCTION INTERNAL FIXATION (ORIF) LEFT BIMALLEOLAR ANKLE FRACTURE;  Surgeon: Leandrew Koyanagi, MD;  Location: [facility];  Service: Orthopedics;  Laterality: Left;  . uterine biopsy      ROS: Review of Systems Negative except as stated above  PHYSICAL EXAM: LMP 10/08/2015  Vitals with BMI 05/18/2020 04/01/2020 03/11/2020  Height 5\' 2"  - -  Weight 170 lbs 172 lbs 13 oz -  BMI 10.27 25.3 -  Systolic 664 403 474  Diastolic 86 77 61  Pulse 71  91 61  SpO2- 98%, room air  Temperature- 32F, oral  Physical Exam  General appearance - alert, well appearing, and in no distress and oriented to person, place, and time Eyes - pupils equal and reactive, extraocular eye movements intact Nose - normal and patent, no erythema, discharge or polyps Mouth - mucous membranes moist, pharynx normal without lesions Neck - supple, no significant adenopathy Lymphatics - no palpable lymphadenopathy, no hepatosplenomegaly Chest - clear to auscultation, no wheezes, rales or rhonchi, symmetric air entry, no tachypnea, retractions or cyanosis Heart - normal rate, regular rhythm, normal S1, S2, no murmurs, rubs, clicks or gallops Abdomen - soft, mild discomfort to palpation above umbilicus, no rebound guarding, no suprapubic tenderness Neurological - alert, oriented, normal speech, no focal findings or movement disorder noted, cranial nerves II through XII intact, funduscopic exam normal, discs flat and sharp, DTR's normal and symmetric, motor and sensory grossly normal bilaterally, normal muscle tone, no tremors, strength 5/5, Romberg sign negative, normal gait and station Back- no CVA tenderness, mild mid thoracic paraspinous discomfort to palpation Extremities- no edema   ASSESSMENT AND PLAN: 1. Chronic thoracic spine pain: -Meloxicam and Gabapentin for chronic thoracic spine pain.  -Patient with history of T8-T9 disc protrusion. Chronic back pain for the last 3 years as a result of falling off a ladder and onto the concrete landing on her buttocks. Referral to Sports Medicine for further evaluation and management.  -Follow-up with primary physician as needed. - Ambulatory referral to Sports Medicine - meloxicam (MOBIC) 7.5 MG tablet; Take 1 tablet (7.5 mg total) by mouth daily.  Dispense: 30 tablet; Refill: 3 - gabapentin (NEURONTIN) 100 MG capsule; TAKE 1 CAPSULE BY MOUTH 2 TIMES DAILY.  Dispense: 60 capsule; Refill: 3   2. Language  barrier: English as a second language teacher participated during this visit. Interpreter Name: Rise Patience, ID#: [id number]   Patient was given the opportunity to ask questions.  Patient verbalized understanding of the plan and was able to repeat key elements of the plan. Patient was given clear instructions to go to Emergency Department or return to medical center if symptoms don't improve, worsen, or new problems develop.The patient verbalized understanding.  Camillia Herter, NP

## 2020-05-18 NOTE — Progress Notes (Signed)
Here for referral to specialist for chronic back pain   Pt states she believes pain comes from the lungs

## 2020-05-18 NOTE — Patient Instructions (Addendum)
Meloxicam y gabapentina para el dolor de espalda crnico. Haga un seguimiento con el mdico de atencin primaria en 3 meses o antes si es necesario. Remisin a Medicina Deportiva.  Meloxicam and Gabapentin for chronic back pain. Follow-up with primary physician in 3 months or sooner if needed. Referral to Sports Medicine.  Dolor de espalda crnico Chronic Back Pain Cuando el dolor en la espalda dura ms de 3 meses, se denomina dolor de espalda crnico. El dolor puede empeorar en ciertos momentos (exacerbaciones). Hay cosas que puede hacer en su casa para Administrator, sports. Siga estas indicaciones en su casa: Actividad      Evite agacharse y Optometrist otras actividades que Sales executive.  Cuando est de pie: ? Mantenga la parte alta de la espalda y el cuello rectos. ? Mantenga los hombros [employer]. ? Evite encorvarse.  Cuando est sentado: ? Mantenga la espalda recta. ? Relaje los hombros. No curve los hombros ni los [employer].  No permanezca sentado o de pie en el mismo lugar durante mucho tiempo.  [place], haga pausas breves para descansar. Por lo general, recostarse o Public affairs consultant de pie es mejor que permanecer sentado. Descansar puede ayudar a Best boy.  Cuando est sentado o de pie por [employer], haga un poco de [place] o ejercicios de estiramiento. Esto ayudar a Mining engineer rigidez y Conservation officer, historic buildings.  Realice actividad fsica con regularidad. Pregntele al mdico qu actividades son seguras para usted.  No levante ningn objeto que pese ms de 10libras (4,5kg). Para evitar lesionarse cuando levanta objetos: ? [place]. ? Mantenga el peso cerca del cuerpo. ? No gire el cuerpo. Control del dolor  Si se lo indican, aplique hielo sobre la zona dolorida. El mdico puede indicarle que use hielo durante las 24 a 48 horas luego del comienzo de Physiological scientist. ? Ponga el hielo en una bolsa plstica. ? Coloque una [employer] piel  y la bolsa de hielo. ? Coloque el hielo durante 26minutos, 2 a 3veces por da.  Si se lo indican, aplique calor en la zona dolorida con la frecuencia que le indique el mdico. Use la fuente de calor que el mdico le recomiende, como una compresa de calor hmedo o una almohadilla trmica. ? Coloque una [employer] piel y la fuente de Freight forwarder. ? Aplique el calor durante 20 a 66minutos. ? Retire la fuente de calor si la piel se pone de color rojo brillante. Esto es muy importante si no puede Education officer, environmental, calor o fro. Puede correr un riesgo mayor de sufrir quemaduras.  Sumrjase en un bao clido. Esto puede ayudar a Best boy.  Tome los medicamentos de venta libre y los recetados solamente como se lo haya indicado el mdico. Instrucciones generales  Duerma sobre un [place]. Intente acostarse de costado, con las rodillas ligeramente flexionadas. Si se recuesta [employer], coloque una almohada debajo de las rodillas.  Concurra a todas las visitas de seguimiento como se lo haya indicado el mdico. Esto es importante. Comunquese con un mdico si:  Tiene un dolor que no mejora con descanso ni medicamentos. Solicite ayuda de inmediato si:  Siente debilidad en uno o ambos brazos o piernas.  Pierde la sensibilidad (adormecimiento) en uno o ambos brazos o piernas.  Le cuesta controlar cundo defecar (tener deposiciones) o Field seismologist pis (orinar).  Siente malestar estomacal (nuseas).  Vomita.  Tiene dolor de vientre (abdominal).  Le falta el aire.  Pierde el conocimiento (se desmaya). Resumen  Cuando el dolor en la espalda dura ms de 3 meses, se denomina dolor de espalda crnico.  El dolor puede empeorar en ciertos momentos (exacerbaciones).  Use hielo y calor segn le indique el mdico. El mdico puede indicarle que use hielo luego del comienzo de las exacerbaciones. Esta informacin no tiene Marine scientist el consejo del mdico. Asegrese de hacerle al  mdico cualquier pregunta que tenga. Document Revised: 01/11/2018 Document Reviewed: 08/14/2017 Elsevier Patient Education  2020 [employer].

## 2020-05-25 ENCOUNTER — Other Ambulatory Visit: Payer: Self-pay | Admitting: Obstetrics and Gynecology

## 2020-05-25 ENCOUNTER — Telehealth (INDEPENDENT_AMBULATORY_CARE_PROVIDER_SITE_OTHER): Payer: Self-pay

## 2020-05-25 DIAGNOSIS — N939 Abnormal uterine and vaginal bleeding, unspecified: Secondary | ICD-10-CM

## 2020-05-25 MED ORDER — MEGESTROL ACETATE 40 MG PO TABS: 40.0000 mg | ORAL_TABLET | Freq: Two times a day (BID) | ORAL | 5 refills | Status: DC

## 2020-05-25 MED FILL — MEGESTROL 40 MG TABLET: 40 | 15 days supply | Qty: 60 | Fill #0

## 2020-05-25 NOTE — Telephone Encounter (Signed)
Pt called front office staff. Call transferred to clinical staff with [facility] interpreter [phone]. Pt reports recent surgery with Dr. Elly Modena. Per chart review pt has a D&C on 03/11/20. States she was told to notify her doctor of any changes. Pt reports spotting 2 weeks ago, spotting began again this AM and pt felt she should call. I explained to pt I will notify Dr. Elly Modena and we will be in touch regarding follow up. Pt verbalized understanding.

## 2020-05-26 ENCOUNTER — Other Ambulatory Visit: Payer: Self-pay | Admitting: Family Medicine

## 2020-05-26 DIAGNOSIS — I10 Essential (primary) hypertension: Secondary | ICD-10-CM

## 2020-05-26 MED FILL — LISINOPRIL 10 MG TABS: 10 | 30 days supply | Qty: 30 | Fill #0

## 2020-05-26 MED FILL — OMEPRAZOLE DR 40 MG CAPSULE: 40 | 30 days supply | Qty: 60 | Fill #3

## 2020-05-26 MED FILL — ?ATORVASTATIN 20 MG TABLET: 20 | 30 days supply | Qty: 30 | Fill #2

## 2020-05-26 NOTE — Telephone Encounter (Signed)
Called pt with [facility] interpreter Jenny Reichmann ID [phone]. Instructions given for Meg[place] administration. Explained to pt that she should follow up with our office if bleeding continues. Explained it is important that pt be sure this is vaginal bleeding and not rectal bleeding, as this would require different exam and treatment.

## 2020-05-27 ENCOUNTER — Ambulatory Visit (INDEPENDENT_AMBULATORY_CARE_PROVIDER_SITE_OTHER): Payer: Self-pay | Admitting: Family Medicine

## 2020-05-27 ENCOUNTER — Encounter: Payer: Self-pay | Admitting: Family Medicine

## 2020-05-27 ENCOUNTER — Other Ambulatory Visit: Payer: Self-pay

## 2020-05-27 DIAGNOSIS — M5441 Lumbago with sciatica, right side: Secondary | ICD-10-CM

## 2020-05-27 DIAGNOSIS — M546 Pain in thoracic spine: Secondary | ICD-10-CM

## 2020-05-27 DIAGNOSIS — G8929 Other chronic pain: Secondary | ICD-10-CM

## 2020-05-27 NOTE — Progress Notes (Signed)
Office Visit Note   Patient: Susan Lopez           Date of Birth: 1961-10-16           MRN: [id number] Visit Date: 05/27/2020 Requested by: Camillia Herter, NP [address] [place],  [place] [address] PCP: Antony Blackbird, MD  Subjective: Chief Complaint  Patient presents with  . Middle Back - Pain    Pain right side of the middle back, radiating to lower back since fall down stairs approx 3 years ago. Pain is constant. Had MRI 07/2017 at [facility].    HPI: She is here with back pain. About 3 years ago she fell down some stairs and injured her back. She ultimately had an MRI scan in 2018 showing a T8-T9 disc protrusion. She has tried various medications with minimal improvement. She has not been to physical therapy or a chiropractor. She has pain in the low back as well, it bothers her more than the thoracic area. No radicular pain. No bowel or bladder dysfunction.  Interpreter was present today.               ROS:   All other systems were reviewed and are negative.  Objective: Vital Signs: LMP 10/08/2015   Physical Exam:  General:  Alert and oriented, in no acute distress. Pulm:  Breathing unlabored. Psy:  Normal mood, congruent affect.  Back: She has tender trigger point in the right rhomboid area that seems to reproduce her thoracic pain. There is no tenderness over the spinous processes. In the lumbar spine she has tenderness over the L5-S1 level. No pain in the sciatic notch or SI joints. Straight leg raise negative, lower extremity strength and reflexes are normal.   Imaging: No results found.  Assessment & Plan: 1. Chronic back pain, suspect mostly myofascial. She has T8-T9 disc protrusion but I suspect it is not the main source of her pain. -We will try physical therapy. If she fails to improve, we will take x-rays of the lumbar spine.     Procedures: No procedures performed  No notes on file     PMFS History: Patient Active Problem List    Diagnosis Date Noted  . Endometrial mass   . Snoring 07/05/2017  . OSA (obstructive sleep apnea) 07/05/2017  . 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 Left eye injury 11/23/2016  . Urinary incontinence in female 05/19/2016  . Bimalleolar fracture of left ankle 11/13/2015  . S/P ORIF (open reduction internal fixation) fracture 11/13/2015  . Healthcare maintenance 10/08/2015  . Postmenopausal bleeding 10/08/2015  . Pain in joint, ankle and foot 10/08/2015  . Headache disorder 10/08/2015  . Atypical chest pain 10/08/2015  . Essential hypertension 10/08/2015  . Neck pain on right side 08/12/2015  . Grief reaction 03/09/2015  . Chronic thoracic spine pain 12/11/2014  . Depression (emotion) 08/12/2014  . Dyslipidemia 08/12/2014  . Headache(784.0) 07/17/2014  . UTI (urinary tract infection) 04/16/2014  . Cystocele with rectocele 04/16/2014  . Hyperlipidemia LDL goal < 100 01/22/2014  . Breast lump on left side at 12 o'clock position 10/22/2013  . Chronic constipation 10/09/2013  . Bone lesion 10/04/2013  . Mammogram abnormal 10/04/2013  . Abdominal pain, unspecified site 10/04/2013  . Unspecified essential hypertension 06/10/2013  . Chest pain, unspecified 06/10/2013  . Chronic throat pain 06/10/2013  . Back pain 11/14/2011  . Obesity (BMI 30-39.9) 11/14/2011  . GERD (gastroesophageal reflux disease) 09/19/2011  . Calculus of gallbladder  with acute and chronic cholecystitis without obstruction, s/p lap chole 27Dec2012 09/19/2011  . Hemorrhoids, internal, with bleeding 09/19/2011  . ANEMIA-IRON DEFICIENCY 03/19/2009  . CHEST PAIN, ATYPICAL 03/19/2009   Past Medical History:  Diagnosis Date  . Allergy   . Anxiety   . Arthritis   . Bone lesion 10/04/2013  . Calculus of gallbladder with acute and chronic cholecystitis without obstruction, s/p lap chole 27Dec2012 09/19/2011  . Cataract 2009   bilateral  . Colon polyps   . Complication of anesthesia   . Depression   .  Gallstones   . GERD (gastroesophageal reflux disease)   . Hemorrhoids, external 10-21-11   occ. bothersome  . Hyperlipidemia   . Hypertension   . Iron deficiency anemia   . Mammogram abnormal 10/04/2013  . PONV (postoperative nausea and vomiting)   . Sleep apnea     Family History  Problem Relation Age of Onset  . Hypertension Mother   . Cancer Mother   . Hypertension Father   . Arthritis Brother   . Hypertension Brother   . Intellectual disability Paternal Aunt   . Colon cancer Neg Hx     Past Surgical History:  Procedure Laterality Date  . [place]  . CHOLECYSTECTOMY  10/27/2011   Procedure: LAPAROSCOPIC CHOLECYSTECTOMY WITH INTRAOPERATIVE CHOLANGIOGRAM;  Surgeon: Adin Hector, MD;  Location[facility];  Service: General;  Laterality: N/A;  Laparoscopic Chole w/ IOC Single Site  . COLONOSCOPY  2019  . DILATATION & CURETTAGE/HYSTEROSCOPY WITH MYOSURE N/A 03/11/2020   Procedure: DILATATION AND CURETTAGE /HYSTEROSCOPY WITH MYOSURE;  Surgeon: Mora Bellman, MD;  Location: [place];  Service: Gynecology;  Laterality: N/A;  . DRUG INDUCED ENDOSCOPY    . EYE SURGERY  1`12-21-10   bil. for tissue growth-laser surgery  . HARDWARE REMOVAL Left 03/16/2016   Procedure: LEFT ANKLE HARDWARE REMOVAL;  Surgeon: Leandrew Koyanagi, MD;  Location: [place];  Service: Orthopedics;  Laterality: Left;  Marland Kitchen MM BREAST STEREO BIOPSY LEFT ([place] HX) Left 2015  . ORIF ANKLE FRACTURE Left 11/13/2015   Procedure: OPEN REDUCTION INTERNAL FIXATION (ORIF) LEFT BIMALLEOLAR ANKLE FRACTURE;  Surgeon: Leandrew Koyanagi, MD;  Location: [place];  Service: Orthopedics;  Laterality: Left;  . uterine biopsy     Social History   Occupational History  . Occupation: unemployed  Tobacco Use  . Smoking status: Former Smoker    Packs/day: 0.25    Years: 10.00    Pack years: 2.50    Quit date: 10/20/2004    Years since quitting: 15.6  . Smokeless tobacco: Never Used  Vaping  Use  . Vaping Use: Never used  Substance and Sexual Activity  . Alcohol use: No  . Drug use: No  . Sexual activity: Not Currently    Birth control/protection: Post-menopausal

## 2020-06-02 ENCOUNTER — Ambulatory Visit: Payer: Self-pay | Attending: Physical Therapy | Admitting: Physical Therapy

## 2020-06-02 DIAGNOSIS — M549 Dorsalgia, unspecified: Secondary | ICD-10-CM | POA: Insufficient documentation

## 2020-06-02 DIAGNOSIS — R293 Abnormal posture: Secondary | ICD-10-CM | POA: Insufficient documentation

## 2020-06-02 DIAGNOSIS — G8929 Other chronic pain: Secondary | ICD-10-CM | POA: Insufficient documentation

## 2020-06-02 DIAGNOSIS — M6281 Muscle weakness (generalized): Secondary | ICD-10-CM | POA: Insufficient documentation

## 2020-06-02 DIAGNOSIS — M5442 Lumbago with sciatica, left side: Secondary | ICD-10-CM | POA: Insufficient documentation

## 2020-06-05 ENCOUNTER — Other Ambulatory Visit: Payer: Self-pay

## 2020-06-05 ENCOUNTER — Ambulatory Visit: Payer: Self-pay

## 2020-06-05 DIAGNOSIS — M549 Dorsalgia, unspecified: Secondary | ICD-10-CM

## 2020-06-05 DIAGNOSIS — M6281 Muscle weakness (generalized): Secondary | ICD-10-CM

## 2020-06-05 DIAGNOSIS — R293 Abnormal posture: Secondary | ICD-10-CM

## 2020-06-05 DIAGNOSIS — M5442 Lumbago with sciatica, left side: Secondary | ICD-10-CM

## 2020-06-05 NOTE — Therapy (Signed)
[place] [address] [place], [place], [address] Phone: [phone]   Fax:  [phone]  Physical Therapy Evaluation  Patient Details  Name: Susan Lopez MRN: [id number] Date of Birth: 05/25/1961 Referring Provider (PT): Antony Blackbird, MD   Encounter Date: 06/05/2020   PT End of Session - 06/05/20 1134    Visit Number 1    Number of Visits 12    PT Start Time 0930    PT Stop Time 1018    PT Time Calculation (min) 48 min    Activity Tolerance Patient tolerated treatment well    Behavior During Therapy [facility] for tasks assessed/performed           Past Medical History:  Diagnosis Date  . Allergy   . Anxiety   . Arthritis   . Bone lesion 10/04/2013  . Calculus of gallbladder with acute and chronic cholecystitis without obstruction, s/p lap chole 27Dec2012 09/19/2011  . Cataract 2009   bilateral  . Colon polyps   . Complication of anesthesia   . Depression   . Gallstones   . GERD (gastroesophageal reflux disease)   . Hemorrhoids, external 10-21-11   occ. bothersome  . Hyperlipidemia   . Hypertension   . Iron deficiency anemia   . Mammogram abnormal 10/04/2013  . PONV (postoperative nausea and vomiting)   . Sleep apnea     Past Surgical History:  Procedure Laterality Date  . [place]  . CHOLECYSTECTOMY  10/27/2011   Procedure: LAPAROSCOPIC CHOLECYSTECTOMY WITH INTRAOPERATIVE CHOLANGIOGRAM;  Surgeon: Adin Hector, MD;  Location: [facility];  Service: General;  Laterality: N/A;  Laparoscopic Chole w/ IOC Single Site  . COLONOSCOPY  2019  . DILATATION & CURETTAGE/HYSTEROSCOPY WITH MYOSURE N/A 03/11/2020   Procedure: DILATATION AND CURETTAGE /HYSTEROSCOPY WITH MYOSURE;  Surgeon: Mora Bellman, MD;  Location: [place];  Service: Gynecology;  Laterality: N/A;  . DRUG INDUCED ENDOSCOPY    . EYE SURGERY  1`12-21-10   bil. for tissue growth-laser surgery  . HARDWARE  REMOVAL Left 03/16/2016   Procedure: LEFT ANKLE HARDWARE REMOVAL;  Surgeon: Leandrew Koyanagi, MD;  Location: [place];  Service: Orthopedics;  Laterality: Left;  Marland Kitchen MM BREAST STEREO BIOPSY LEFT ([place] HX) Left 2015  . ORIF ANKLE FRACTURE Left 11/13/2015   Procedure: OPEN REDUCTION INTERNAL FIXATION (ORIF) LEFT BIMALLEOLAR ANKLE FRACTURE;  Surgeon: Leandrew Koyanagi, MD;  Location: [place];  Service: Orthopedics;  Laterality: Left;  . uterine biopsy      There were no vitals filed for this visit.    Subjective Assessment - 06/05/20 0933    Subjective Pt reports onset of back pain (via interpreter) for 3 years. She has been prescribed medication each time she has gone to the doctor, four times. Meloxicam helped some, but made her want to sleep. Pt wen to chiropractor a couple of years ago and they cracked her back (unrelated to this problem). Pt mostly has left back pain and lateral left leg down to the bottom of the foot.    How long can you walk comfortably? as long as she wants    Currently in Pain? Yes    Pain Location Back    Pain Orientation Left;Lower    Pain Onset More than a month ago    Pain Frequency Intermittent    Aggravating Factors  standing,    Multiple Pain Sites Yes    Pain Location Back  Pain Orientation Right;Upper    Pain Onset More than a month ago    Pain Frequency Constant    Aggravating Factors  standing, doing dishes and cleaning kitchen    Pain Relieving Factors lying down              [facility] PT Assessment - 06/05/20 0001      Assessment   Medical Diagnosis LBP with L-sided sciatica. right upper back pain    Referring Provider (PT) Antony Blackbird, MD    Onset Date/Surgical Date 06/05/18    Hand Dominance Right    Next MD Visit Unknown    Prior Therapy None      ROM / Strength   AROM / PROM / Strength AROM      AROM   Overall AROM Comments Lumbar AROM limited 2 pain      Flexibility   Soft Tissue Assessment /Muscle Length yes    Hamstrings L  WFL, R limited      Palpation   Spinal mobility 2/6 thoracic mobility    Palpation comment TTP to R upper back, L glute      Special Tests    Special Tests Lumbar    Lumbar Tests FABER test;Straight Leg Raise      FABER test   findings Negative      Straight Leg Raise   Findings Negative    Comment significant flexibility difference R resricted >L                      Objective measurements completed on examination: See above findings.               PT Education - 06/05/20 1133    Education Details Diagnosis, Prognosis, HEP, POC    Person(s) Educated Patient;Other (comment)   interpreter   Methods Explanation;Demonstration;Handout    Comprehension Verbalized understanding;Returned demonstration            PT Short Term Goals - 06/05/20 1139      PT SHORT TERM GOAL #1   Title Pt will be I and compliant with initial HEP.    Time 2    Period Weeks    Status New    Target Date 06/19/20      PT SHORT TERM GOAL #2   Title Pt will report decrease in pain by 50%.    Time 3    Period Weeks    Status New    Target Date 06/26/20             PT Long Term Goals - 06/05/20 1139      PT LONG TERM GOAL #1   Title Pt will demonstrate pain free lumbar AROM WFL.    Time 6    Period Weeks    Status New    Target Date 07/17/20      PT LONG TERM GOAL #2   Title Pt will demonstrate neutral lumbopelvic alignment in multiple positions with no pain.    Time 6    Period Weeks    Status New    Target Date 07/17/20      PT LONG TERM GOAL #3   Title Pt will report no pain with cooking or cleaning kitchen.    Time 6    Period Weeks    Status New    Target Date 07/17/20      PT LONG TERM GOAL #4   Title Pt will be able to return to normal activity  levels with on c/o pain, in order to care for children.    Time 6    Period Weeks    Status New    Target Date 07/17/20                  Plan - 06/05/20 1134    Clinical Impression  Statement Pt is a 59 yo female who presents with 3 year history of R upper back pain and more recent onset on low back pain with left sciatica symptoms. Pt is overweight with increased pull on upper and lower back with poor core, glute and postural strength/endurance. Pt is hypomobile in thoracic and lumbar spine and responded well to initiation of HEP for increased mobility and lumbopelvic alignment. Via interpreter, pt was educated on diagnosis, prognosis, HEP, and POC consenting to tx and verbalizing understanding. She will benefit from skilled PT 1-2x/week for 4-8 weeks to treat impairments and decrease pain.    Personal Factors and Comorbidities Age;Fitness;Comorbidity 3+;Comorbidity 2;Comorbidity 1;Past/Current Experience;Time since onset of injury/illness/exacerbation    Comorbidities Obesity, arthritis, HLD, HTN    Examination-Activity Limitations Stand;Lift;Squat;Bend    Examination-Participation Restrictions Cleaning;Laundry;Shop;Community Activity    Stability/Clinical Decision Making Stable/Uncomplicated    Clinical Decision Making Low    Rehab Potential Good    PT Frequency 2x / week    PT Duration 6 weeks    PT Treatment/Interventions ADLs/Self Care Home Management;Spinal Manipulations;Joint Manipulations;Cryotherapy;Electrical Stimulation;Moist Heat;Therapeutic activities;Therapeutic exercise;Patient/family education;Neuromuscular re-education;Manual techniques;Passive range of motion    PT Next Visit Plan Assess response to HEP, progress mobility, stretching, and strength    PT Home Exercise Plan see pt instructions    Consulted and Agree with Plan of Care Patient           Patient will benefit from skilled therapeutic intervention in order to improve the following deficits and impairments:  Decreased range of motion, Increased fascial restricitons, Postural dysfunction, Decreased mobility, Decreased strength, Hypomobility, Impaired flexibility, Improper body mechanics, Pain,  Impaired perceived functional ability  Visit Diagnosis: Chronic bilateral low back pain with left-sided sciatica - Plan: PT plan of care cert/re-cert  Upper back pain on right side - Plan: PT plan of care cert/re-cert  Muscle weakness (generalized) - Plan: PT plan of care cert/re-cert  Abnormal posture - Plan: PT plan of care cert/re-cert     Problem List Patient Active Problem List   Diagnosis Date Noted  . Endometrial mass   . Snoring 07/05/2017  . OSA (obstructive sleep apnea) 07/05/2017  . 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 Left eye injury 11/23/2016  . Urinary incontinence in female 05/19/2016  . Bimalleolar fracture of left ankle 11/13/2015  . S/P ORIF (open reduction internal fixation) fracture 11/13/2015  . Healthcare maintenance 10/08/2015  . Postmenopausal bleeding 10/08/2015  . Pain in joint, ankle and foot 10/08/2015  . Headache disorder 10/08/2015  . Atypical chest pain 10/08/2015  . Essential hypertension 10/08/2015  . Neck pain on right side 08/12/2015  . Grief reaction 03/09/2015  . Chronic thoracic spine pain 12/11/2014  . Depression (emotion) 08/12/2014  . Dyslipidemia 08/12/2014  . Headache(784.0) 07/17/2014  . UTI (urinary tract infection) 04/16/2014  . Cystocele with rectocele 04/16/2014  . Hyperlipidemia LDL goal < 100 01/22/2014  . Breast lump on left side at 12 o'clock position 10/22/2013  . Chronic constipation 10/09/2013  . Bone lesion 10/04/2013  . Mammogram abnormal 10/04/2013  . Abdominal pain, unspecified site 10/04/2013  . Unspecified essential hypertension 06/10/2013  . Chest pain, unspecified 06/10/2013  .  Chronic throat pain 06/10/2013  . Back pain 11/14/2011  . Obesity (BMI 30-39.9) 11/14/2011  . GERD (gastroesophageal reflux disease) 09/19/2011  . Calculus of gallbladder with acute and chronic cholecystitis without obstruction, s/p lap chole 27Dec2012 09/19/2011  . Hemorrhoids, internal, with bleeding 09/19/2011  .  ANEMIA-IRON DEFICIENCY 03/19/2009  . CHEST PAIN, ATYPICAL 03/19/2009    Izell Des Peres, PT, DPT 06/05/2020, 11:43 AM  [address] [place], [place], [address] Phone: [phone]   Fax:  [phone]  Name: Susan Lopez MRN: [id number] Date of Birth: 1961/09/12

## 2020-06-08 ENCOUNTER — Ambulatory Visit: Payer: Self-pay

## 2020-06-08 ENCOUNTER — Other Ambulatory Visit: Payer: Self-pay

## 2020-06-08 DIAGNOSIS — R293 Abnormal posture: Secondary | ICD-10-CM

## 2020-06-08 DIAGNOSIS — M549 Dorsalgia, unspecified: Secondary | ICD-10-CM

## 2020-06-08 DIAGNOSIS — M6281 Muscle weakness (generalized): Secondary | ICD-10-CM

## 2020-06-08 DIAGNOSIS — G8929 Other chronic pain: Secondary | ICD-10-CM

## 2020-06-08 NOTE — Therapy (Signed)
[place], [place], [address] Phone: [phone]   Fax:  [phone]  Physical Therapy Treatment  Patient Details  Name: Glady Ouderkirk MRN: [id number] Date of Birth: 1961/07/18 Referring Provider (PT): Antony Blackbird, MD   Encounter Date: 06/08/2020   PT End of Session - 06/08/20 1153    Visit Number 2    Number of Visits 12    PT Start Time 6720    PT Stop Time 1230    PT Time Calculation (min) 42 min    Activity Tolerance Patient tolerated treatment well    Behavior During Therapy [facility] for tasks assessed/performed           Past Medical History:  Diagnosis Date  . Allergy   . Anxiety   . Arthritis   . Bone lesion 10/04/2013  . Calculus of gallbladder with acute and chronic cholecystitis without obstruction, s/p lap chole 27Dec2012 09/19/2011  . Cataract 2009   bilateral  . Colon polyps   . Complication of anesthesia   . Depression   . Gallstones   . GERD (gastroesophageal reflux disease)   . Hemorrhoids, external 10-21-11   occ. bothersome  . Hyperlipidemia   . Hypertension   . Iron deficiency anemia   . Mammogram abnormal 10/04/2013  . PONV (postoperative nausea and vomiting)   . Sleep apnea     Past Surgical History:  Procedure Laterality Date  . [place]  . CHOLECYSTECTOMY  10/27/2011   Procedure: LAPAROSCOPIC CHOLECYSTECTOMY WITH INTRAOPERATIVE CHOLANGIOGRAM;  Surgeon: Adin Hector, MD;  [facility];  Service: General;  Laterality: N/A;  Laparoscopic Chole w/ IOC Single Site  . COLONOSCOPY  2019  . DILATATION & CURETTAGE/HYSTEROSCOPY WITH MYOSURE N/A 03/11/2020   Procedure: DILATATION AND CURETTAGE /HYSTEROSCOPY WITH MYOSURE;  Surgeon: Mora Bellman, MD;  Location: [place];  Service: Gynecology;  Laterality: N/A;  . DRUG INDUCED ENDOSCOPY    . EYE SURGERY  1`12-21-10   bil. for tissue growth-laser surgery  . HARDWARE  REMOVAL Left 03/16/2016   Procedure: LEFT ANKLE HARDWARE REMOVAL;  Surgeon: Leandrew Koyanagi, MD;  Location: [place];  Service: Orthopedics;  Laterality: Left;  Marland Kitchen MM BREAST STEREO BIOPSY LEFT ([place] HX) Left 2015  . ORIF ANKLE FRACTURE Left 11/13/2015   Procedure: OPEN REDUCTION INTERNAL FIXATION (ORIF) LEFT BIMALLEOLAR ANKLE FRACTURE;  Surgeon: Leandrew Koyanagi, MD;  Location: [place];  Service: Orthopedics;  Laterality: Left;  . uterine biopsy      There were no vitals filed for this visit.   Subjective Assessment - 06/08/20 1147    Subjective Pt reports she has some pain, but not as bad today. The exercises are helping.    How long can you walk comfortably? as long as she wants    Currently in Pain? Yes    Pain Location Back    Pain Orientation Right;Upper    Pain Onset More than a month ago    Pain Onset More than a month ago                             [facility] Adult PT Treatment/Exercise - 06/08/20 0001      Exercises   Exercises Lumbar      Lumbar Exercises: Stretches   Prone on Elbows Stretch Limitations 3'    Press Ups 10 reps    Other  Lumbar Stretch Exercise B book opener x 15      Lumbar Exercises: Machines for Strengthening   Other Lumbar Machine Exercise cybex row 2 x 15, LAT PD 2 x 15      Lumbar Exercises: Standing   Other Standing Lumbar Exercises ER 0 abd at corner with BTB 12x5"      Lumbar Exercises: Seated   Other Seated Lumbar Exercises Seated rows with BTB x 15                    PT Short Term Goals - 06/05/20 1139      PT SHORT TERM GOAL #1   Title Pt will be I and compliant with initial HEP.    Time 2    Period Weeks    Status New    Target Date 06/19/20      PT SHORT TERM GOAL #2   Title Pt will report decrease in pain by 50%.    Time 3    Period Weeks    Status New    Target Date 06/26/20             PT Long Term Goals - 06/05/20 1139      PT LONG TERM GOAL #1   Title Pt will demonstrate pain  free lumbar AROM WFL.    Time 6    Period Weeks    Status New    Target Date 07/17/20      PT LONG TERM GOAL #2   Title Pt will demonstrate neutral lumbopelvic alignment in multiple positions with no pain.    Time 6    Period Weeks    Status New    Target Date 07/17/20      PT LONG TERM GOAL #3   Title Pt will report no pain with cooking or cleaning kitchen.    Time 6    Period Weeks    Status New    Target Date 07/17/20      PT LONG TERM GOAL #4   Title Pt will be able to return to normal activity levels with on c/o pain, in order to care for children.    Time 6    Period Weeks    Status New    Target Date 07/17/20                 Plan - 06/08/20 1154    Clinical Impression Statement Pt presents with improvement in pain and good tolerance to tx today. R upper back pain was her largest complaint and more of a tightness today. She was able to progress into some gentle upper back strengthening with good tolerance.    Personal Factors and Comorbidities Age;Fitness;Comorbidity 3+;Comorbidity 2;Comorbidity 1;Past/Current Experience;Time since onset of injury/illness/exacerbation    Comorbidities Obesity, arthritis, HLD, HTN    Examination-Activity Limitations Stand;Lift;Squat;Bend    Examination-Participation Restrictions Cleaning;Laundry;Shop;Community Activity    Stability/Clinical Decision Making Stable/Uncomplicated    Rehab Potential Good    PT Frequency 2x / week    PT Duration 6 weeks    PT Treatment/Interventions ADLs/Self Care Home Management;Spinal Manipulations;Joint Manipulations;Cryotherapy;Electrical Stimulation;Moist Heat;Therapeutic activities;Therapeutic exercise;Patient/family education;Neuromuscular re-education;Manual techniques;Passive range of motion    PT Next Visit Plan Progress mobility, stretching, and strength    PT Home Exercise Plan see pt instructions    Consulted and Agree with Plan of Care Patient           Patient will benefit from  skilled therapeutic intervention in order to improve  the following deficits and impairments:  Decreased range of motion, Increased fascial restricitons, Postural dysfunction, Decreased mobility, Decreased strength, Hypomobility, Impaired flexibility, Improper body mechanics, Pain, Impaired perceived functional ability  Visit Diagnosis: Chronic bilateral low back pain with left-sided sciatica  Upper back pain on right side  Muscle weakness (generalized)  Abnormal posture     Problem List Patient Active Problem List   Diagnosis Date Noted  . Endometrial mass   . Snoring 07/05/2017  . OSA (obstructive sleep apnea) 07/05/2017  . 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 Left eye injury 11/23/2016  . Urinary incontinence in female 05/19/2016  . Bimalleolar fracture of left ankle 11/13/2015  . S/P ORIF (open reduction internal fixation) fracture 11/13/2015  . Healthcare maintenance 10/08/2015  . Postmenopausal bleeding 10/08/2015  . Pain in joint, ankle and foot 10/08/2015  . Headache disorder 10/08/2015  . Atypical chest pain 10/08/2015  . Essential hypertension 10/08/2015  . Neck pain on right side 08/12/2015  . Grief reaction 03/09/2015  . Chronic thoracic spine pain 12/11/2014  . Depression (emotion) 08/12/2014  . Dyslipidemia 08/12/2014  . Headache(784.0) 07/17/2014  . UTI (urinary tract infection) 04/16/2014  . Cystocele with rectocele 04/16/2014  . Hyperlipidemia LDL goal < 100 01/22/2014  . Breast lump on left side at 12 o'clock position 10/22/2013  . Chronic constipation 10/09/2013  . Bone lesion 10/04/2013  . Mammogram abnormal 10/04/2013  . Abdominal pain, unspecified site 10/04/2013  . Unspecified essential hypertension 06/10/2013  . Chest pain, unspecified 06/10/2013  . Chronic throat pain 06/10/2013  . Back pain 11/14/2011  . Obesity (BMI 30-39.9) 11/14/2011  . GERD (gastroesophageal reflux disease) 09/19/2011  . Calculus of gallbladder with acute and  chronic cholecystitis without obstruction, s/p lap chole 27Dec2012 09/19/2011  . Hemorrhoids, internal, with bleeding 09/19/2011  . ANEMIA-IRON DEFICIENCY 03/19/2009  . CHEST PAIN, ATYPICAL 03/19/2009    Izell Clayton, PT, DPT 06/08/2020, 12:30 PM  [place] [address] [place], [place], [address] Phone: [phone]   Fax:  [phone]  Name: Mikahla Wisor MRN: [id number] Date of Birth: Apr 29, 1961

## 2020-06-10 ENCOUNTER — Other Ambulatory Visit: Payer: Self-pay

## 2020-06-10 ENCOUNTER — Encounter: Payer: Self-pay | Admitting: Physical Therapy

## 2020-06-10 ENCOUNTER — Ambulatory Visit: Payer: Self-pay | Admitting: Physical Therapy

## 2020-06-10 DIAGNOSIS — R293 Abnormal posture: Secondary | ICD-10-CM

## 2020-06-10 DIAGNOSIS — G8929 Other chronic pain: Secondary | ICD-10-CM

## 2020-06-10 DIAGNOSIS — M549 Dorsalgia, unspecified: Secondary | ICD-10-CM

## 2020-06-10 DIAGNOSIS — M6281 Muscle weakness (generalized): Secondary | ICD-10-CM

## 2020-06-10 NOTE — Therapy (Signed)
[facility] [place], [place], [address] Phone: [phone]   Fax:  [phone]  Physical Therapy Treatment  Patient Details  Name: Susan Lopez MRN: [id number] Date of Birth: 1961/03/27 Referring Provider (PT): Antony Blackbird, MD   Encounter Date: 06/10/2020   PT End of Session - 06/10/20 1741    Visit Number 3    Number of Visits 12    PT Start Time 6962    PT Stop Time 9528    PT Time Calculation (min) 43 min    Activity Tolerance Patient tolerated treatment well    Behavior During Therapy [facility] for tasks assessed/performed           Past Medical History:  Diagnosis Date   Allergy    Anxiety    Arthritis    Bone lesion 10/04/2013   Calculus of gallbladder with acute and chronic cholecystitis without obstruction, s/p lap chole 27Dec2012 09/19/2011   Cataract 2009   bilateral   Colon polyps    Complication of anesthesia    Depression    Gallstones    GERD (gastroesophageal reflux disease)    Hemorrhoids, external 10-21-11   occ. bothersome   Hyperlipidemia    Hypertension    Iron deficiency anemia    Mammogram abnormal 10/04/2013   PONV (postoperative nausea and vomiting)    Sleep apnea     Past Surgical History:  Procedure Laterality Date   [place], 1992   CHOLECYSTECTOMY  10/27/2011   Procedure: LAPAROSCOPIC CHOLECYSTECTOMY WITH INTRAOPERATIVE CHOLANGIOGRAM;  Surgeon: Adin Hector, MD;  [facility];  Service: General;  Laterality: N/A;  Laparoscopic Chole w/ IOC Single Site   COLONOSCOPY  2019   DILATATION & CURETTAGE/HYSTEROSCOPY WITH MYOSURE N/A 03/11/2020   Procedure: DILATATION AND CURETTAGE /HYSTEROSCOPY WITH MYOSURE;  Surgeon: Mora Bellman, MD;  Location: [place];  Service: Gynecology;  Laterality: N/A;   DRUG INDUCED ENDOSCOPY     EYE SURGERY  1`12-21-10   bil. for tissue growth-laser surgery   HARDWARE  REMOVAL Left 03/16/2016   Procedure: LEFT ANKLE HARDWARE REMOVAL;  Surgeon: Leandrew Koyanagi, MD;  Location: [place];  Service: Orthopedics;  Laterality: Left;   MM BREAST STEREO BIOPSY LEFT ([facility] HX) Left 2015   ORIF ANKLE FRACTURE Left 11/13/2015   Procedure: OPEN REDUCTION INTERNAL FIXATION (ORIF) LEFT BIMALLEOLAR ANKLE FRACTURE;  Surgeon: Leandrew Koyanagi, MD;  Location: [place];  Service: Orthopedics;  Laterality: Left;   uterine biopsy      There were no vitals filed for this visit.   Subjective Assessment - 06/10/20 1654    Subjective Pt reports she is feeling better overall; not as tense and painful    Currently in Pain? Yes    Pain Score 5     Pain Location Back    Pain Orientation Right;Upper                             [facility] Adult PT Treatment/Exercise - 06/10/20 0001      Lumbar Exercises: Stretches   Passive Hamstring Stretch 2 reps;30 seconds    Quad Stretch 2 reps;30 seconds    ITB Stretch 2 reps;30 seconds    Piriformis Stretch 2 reps;30 seconds    Other Lumbar Stretch Exercise B book opener x10 with 3 sec hold      Lumbar Exercises: Aerobic   Nustep L4  x 7 min      Lumbar Exercises: Machines for Strengthening   Other Lumbar Machine Exercise rows and lats 20# 2x10      Manual Therapy   Manual Therapy Soft tissue mobilization;Passive ROM    Soft tissue mobilization STM to B lumbar paraspinals    Passive ROM PROM to B hip all directions                    PT Short Term Goals - 06/10/20 1743      PT SHORT TERM GOAL #1   Title Pt will be I and compliant with initial HEP.    Time 2    Period Weeks    Status Achieved    Target Date 06/19/20      PT SHORT TERM GOAL #2   Title Pt will report decrease in pain by 50%.    Time 3    Period Weeks    Status Partially Met    Target Date 06/26/20             PT Long Term Goals - 06/10/20 1743      PT LONG TERM GOAL #1   Title Pt will demonstrate pain free lumbar  AROM WFL.    Status On-going      PT LONG TERM GOAL #2   Title Pt will demonstrate neutral lumbopelvic alignment in multiple positions with no pain.    Status On-going      PT LONG TERM GOAL #3   Title Pt will report no pain with cooking or cleaning kitchen.    Status On-going      PT LONG TERM GOAL #4   Title Pt will be able to return to normal activity levels with on c/o pain, in order to care for children.    Status On-going                 Plan - 06/10/20 1741    Clinical Impression Statement Pt reports that she got a lot of relief from stretching last rx and that she feels very stiff. Focused on passive stretching and lumbar flexibility this rx; pt reported relief with manual tx. Able to tolerate gentle upper back strengthening; cues for form with seated rows.    PT Treatment/Interventions ADLs/Self Care Home Management;Spinal Manipulations;Joint Manipulations;Cryotherapy;Electrical Stimulation;Moist Heat;Therapeutic activities;Therapeutic exercise;Patient/family education;Neuromuscular re-education;Manual techniques;Passive range of motion    PT Next Visit Plan Progress mobility, stretching, and strength    Consulted and Agree with Plan of Care Patient           Patient will benefit from skilled therapeutic intervention in order to improve the following deficits and impairments:  Decreased range of motion, Increased fascial restricitons, Postural dysfunction, Decreased mobility, Decreased strength, Hypomobility, Impaired flexibility, Improper body mechanics, Pain, Impaired perceived functional ability  Visit Diagnosis: Chronic bilateral low back pain with left-sided sciatica  Upper back pain on right side  Muscle weakness (generalized)  Abnormal posture     Problem List Patient Active Problem List   Diagnosis Date Noted   Endometrial mass    Snoring 07/05/2017   OSA (obstructive sleep apnea) 07/05/2017   Submucous leiomyoma of uterus 02/08/2017   Left  eye injury 11/23/2016   Urinary incontinence in female 05/19/2016   Bimalleolar fracture of left ankle 11/13/2015   S/P ORIF (open reduction internal fixation) fracture 11/13/2015   Healthcare maintenance 10/08/2015   Postmenopausal bleeding 10/08/2015   Pain in joint, ankle and foot 10/08/2015   Headache  disorder 10/08/2015   Atypical chest pain 10/08/2015   Essential hypertension 10/08/2015   Neck pain on right side 08/12/2015   Grief reaction 03/09/2015   Chronic thoracic spine pain 12/11/2014   Depression (emotion) 08/12/2014   Dyslipidemia 08/12/2014   Headache(784.0) 07/17/2014   UTI (urinary tract infection) 04/16/2014   Cystocele with rectocele 04/16/2014   Hyperlipidemia LDL goal < 100 01/22/2014   Breast lump on left side at 12 o'clock position 10/22/2013   Chronic constipation 10/09/2013   Bone lesion 10/04/2013   Mammogram abnormal 10/04/2013   Abdominal pain, unspecified site 10/04/2013   Unspecified essential hypertension 06/10/2013   Chest pain, unspecified 06/10/2013   Chronic throat pain 06/10/2013   Back pain 11/14/2011   Obesity (BMI 30-39.9) 11/14/2011   GERD (gastroesophageal reflux disease) 09/19/2011   Calculus of gallbladder with acute and chronic cholecystitis without obstruction, s/p lap chole 27Dec2012 09/19/2011   Hemorrhoids, internal, with bleeding 09/19/2011   ANEMIA-IRON DEFICIENCY 03/19/2009   CHEST PAIN, ATYPICAL 03/19/2009   Amador Cunas, PT, DPT Donald Prose Darthy Manganelli 06/10/2020, 5:44 PM  [place] [address] [place], [place], [address] Phone: [phone]   Fax:  [phone]  Name: Susan Lopez MRN: [id number] Date of Birth: October 31, 1961

## 2020-06-16 ENCOUNTER — Ambulatory Visit: Payer: Self-pay | Admitting: Physical Therapy

## 2020-06-16 ENCOUNTER — Encounter: Payer: Self-pay | Admitting: Physical Therapy

## 2020-06-16 ENCOUNTER — Other Ambulatory Visit: Payer: Self-pay

## 2020-06-16 DIAGNOSIS — M6281 Muscle weakness (generalized): Secondary | ICD-10-CM

## 2020-06-16 DIAGNOSIS — R293 Abnormal posture: Secondary | ICD-10-CM

## 2020-06-16 DIAGNOSIS — M5442 Lumbago with sciatica, left side: Secondary | ICD-10-CM

## 2020-06-16 DIAGNOSIS — G8929 Other chronic pain: Secondary | ICD-10-CM

## 2020-06-16 DIAGNOSIS — M549 Dorsalgia, unspecified: Secondary | ICD-10-CM

## 2020-06-16 NOTE — Therapy (Signed)
[facility] [place], [place], [address] Phone: [phone]   Fax:  [phone]  Physical Therapy Treatment  Patient Details  Name: Susan Lopez MRN: [id number] Date of Birth: January 03, 1961 Referring Provider (PT): Antony Blackbird, MD   Encounter Date: 06/16/2020   PT End of Session - 06/16/20 1732    Visit Number 4    Number of Visits 12    PT Start Time 1856    PT Stop Time 1743    PT Time Calculation (min) 57 min    Activity Tolerance Patient tolerated treatment well    Behavior During Therapy [facility] for tasks assessed/performed           Past Medical History:  Diagnosis Date  . Allergy   . Anxiety   . Arthritis   . Bone lesion 10/04/2013  . Calculus of gallbladder with acute and chronic cholecystitis without obstruction, s/p lap chole 27Dec2012 09/19/2011  . Cataract 2009   bilateral  . Colon polyps   . Complication of anesthesia   . Depression   . Gallstones   . GERD (gastroesophageal reflux disease)   . Hemorrhoids, external 10-21-11   occ. bothersome  . Hyperlipidemia   . Hypertension   . Iron deficiency anemia   . Mammogram abnormal 10/04/2013  . PONV (postoperative nausea and vomiting)   . Sleep apnea     Past Surgical History:  Procedure Laterality Date  . [place]  . CHOLECYSTECTOMY  10/27/2011   Procedure: LAPAROSCOPIC CHOLECYSTECTOMY WITH INTRAOPERATIVE CHOLANGIOGRAM;  Surgeon: Adin Hector, MD;  [facility];  Service: General;  Laterality: N/A;  Laparoscopic Chole w/ IOC Single Site  . COLONOSCOPY  2019  . DILATATION & CURETTAGE/HYSTEROSCOPY WITH MYOSURE N/A 03/11/2020   Procedure: DILATATION AND CURETTAGE /HYSTEROSCOPY WITH MYOSURE;  Surgeon: Mora Bellman, MD;  Location: [place];  Service: Gynecology;  Laterality: N/A;  . DRUG INDUCED ENDOSCOPY    . EYE SURGERY  1`12-21-10   bil. for tissue growth-laser surgery  . HARDWARE  REMOVAL Left 03/16/2016   Procedure: LEFT ANKLE HARDWARE REMOVAL;  Surgeon: Leandrew Koyanagi, MD;  Location: [place];  Service: Orthopedics;  Laterality: Left;  [place] Kitchen MM BREAST STEREO BIOPSY LEFT ([place] HX) Left 2015  . ORIF ANKLE FRACTURE Left 11/13/2015   Procedure: OPEN REDUCTION INTERNAL FIXATION (ORIF) LEFT BIMALLEOLAR ANKLE FRACTURE;  Surgeon: Leandrew Koyanagi, MD;  Location: [place];  Service: Orthopedics;  Laterality: Left;  . uterine biopsy      There were no vitals filed for this visit.                      [facility] Adult PT Treatment/Exercise - 06/16/20 0001      Lumbar Exercises: Aerobic   Nustep L5 x 7 min      Lumbar Exercises: Machines for Strengthening   Other Lumbar Machine Exercise rows and lats 20# 2x10      Lumbar Exercises: Supine   Other Supine Lumbar Exercises feet on ball K2C, oblique, small bridge, isometric abs      Modalities   Modalities Electrical Stimulation;Moist Heat      Moist Heat Therapy   Number Minutes Moist Heat 10 Minutes    Moist Heat Location Lumbar Spine      Electrical Stimulation   Electrical Stimulation Location right rhomboids into the right lumbar area    Electrical Stimulation Action IFC  Electrical Stimulation Parameters supine    Electrical Stimulation Goals Pain      Manual Therapy   Manual Therapy Soft tissue mobilization;Passive ROM    Soft tissue mobilization use of some vibration to the right rhomboids and the right lumbar area                    PT Short Term Goals - 06/16/20 1735      PT SHORT TERM GOAL #1   Title Pt will be I and compliant with initial HEP.    Status Achieved      PT SHORT TERM GOAL #2   Title Pt will report decrease in pain by 50%.    Status Partially Met             PT Long Term Goals - 06/16/20 1735      PT LONG TERM GOAL #1   Title Pt will demonstrate pain free lumbar AROM WFL.    Status On-going                 Plan - 06/16/20 1733     Clinical Impression Statement Patient continues to report feeling better however she rates her pain a 6-7/10, pain is stiffness and tightness in the right rhomboid to the lumbar area, she does well with the exercises needs a lot of verbal and tactile cues, with some exercises with feet on the ball she had cramping in the hips anteriorly, I added vibration and the estim to see if this will help with the pain levels    PT Next Visit Plan Progress mobility, stretching, and strength, see if we can get the pain down    Consulted and Agree with Plan of Care Patient           Patient will benefit from skilled therapeutic intervention in order to improve the following deficits and impairments:  Decreased range of motion, Increased fascial restricitons, Postural dysfunction, Decreased mobility, Decreased strength, Hypomobility, Impaired flexibility, Improper body mechanics, Pain, Impaired perceived functional ability  Visit Diagnosis: Chronic bilateral low back pain with left-sided sciatica  Upper back pain on right side  Muscle weakness (generalized)  Abnormal posture     Problem List Patient Active Problem List   Diagnosis Date Noted  . Endometrial mass   . Snoring 07/05/2017  . OSA (obstructive sleep apnea) 07/05/2017  . 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 Left eye injury 11/23/2016  . Urinary incontinence in female 05/19/2016  . Bimalleolar fracture of left ankle 11/13/2015  . S/P ORIF (open reduction internal fixation) fracture 11/13/2015  . Healthcare maintenance 10/08/2015  . Postmenopausal bleeding 10/08/2015  . Pain in joint, ankle and foot 10/08/2015  . Headache disorder 10/08/2015  . Atypical chest pain 10/08/2015  . Essential hypertension 10/08/2015  . Neck pain on right side 08/12/2015  . Grief reaction 03/09/2015  . Chronic thoracic spine pain 12/11/2014  . Depression (emotion) 08/12/2014  . Dyslipidemia 08/12/2014  . Headache(784.0) 07/17/2014  . UTI  (urinary tract infection) 04/16/2014  . Cystocele with rectocele 04/16/2014  . Hyperlipidemia LDL goal < 100 01/22/2014  . Breast lump on left side at 12 o'clock position 10/22/2013  . Chronic constipation 10/09/2013  . Bone lesion 10/04/2013  . Mammogram abnormal 10/04/2013  . Abdominal pain, unspecified site 10/04/2013  . Unspecified essential hypertension 06/10/2013  . Chest pain, unspecified 06/10/2013  . Chronic throat pain 06/10/2013  . Back pain 11/14/2011  . Obesity (BMI 30-39.9) 11/14/2011  . GERD (  gastroesophageal reflux disease) 09/19/2011  . Calculus of gallbladder with acute and chronic cholecystitis without obstruction, s/p lap chole 27Dec2012 09/19/2011  . Hemorrhoids, internal, with bleeding 09/19/2011  . ANEMIA-IRON DEFICIENCY 03/19/2009  . CHEST PAIN, ATYPICAL 03/19/2009    Sumner Boast., PT 06/16/2020, 5:36 PM  [facility] [address] [place], [place], [address] Phone: [phone]   Fax:  [phone]  Name: Ky Moskowitz MRN: [id number] Date of Birth: 1960/11/21

## 2020-06-18 ENCOUNTER — Encounter: Payer: Self-pay | Admitting: Physical Therapy

## 2020-06-18 ENCOUNTER — Other Ambulatory Visit: Payer: Self-pay

## 2020-06-18 ENCOUNTER — Ambulatory Visit: Payer: Self-pay | Admitting: Physical Therapy

## 2020-06-18 DIAGNOSIS — M6281 Muscle weakness (generalized): Secondary | ICD-10-CM

## 2020-06-18 DIAGNOSIS — R293 Abnormal posture: Secondary | ICD-10-CM

## 2020-06-18 DIAGNOSIS — G8929 Other chronic pain: Secondary | ICD-10-CM

## 2020-06-18 DIAGNOSIS — M549 Dorsalgia, unspecified: Secondary | ICD-10-CM

## 2020-06-18 NOTE — Therapy (Signed)
[facility], [place], [address] Phone: [phone]   Fax:  [phone]  Physical Therapy Treatment  Patient Details  Name: Susan Lopez MRN: [id number] Date of Birth: [birth year] Referring Provider (PT): Antony Blackbird, MD   Encounter Date: 06/18/2020   PT End of Session - 06/18/20 1756    Visit Number 5    Number of Visits 12    PT Start Time 8184    PT Stop Time 1750    PT Time Calculation (min) 60 min    Activity Tolerance Patient tolerated treatment well    Behavior During Therapy [facility] for tasks assessed/performed           Past Medical History:  Diagnosis Date  . Allergy   . Anxiety   . Arthritis   . Bone lesion 10/04/2013  . Calculus of gallbladder with acute and chronic cholecystitis without obstruction, s/p lap chole 27Dec2012 09/19/2011  . Cataract 2009   bilateral  . Colon polyps   . Complication of anesthesia   . Depression   . Gallstones   . GERD (gastroesophageal reflux disease)   . Hemorrhoids, external 10-21-11   occ. bothersome  . Hyperlipidemia   . Hypertension   . Iron deficiency anemia   . Mammogram abnormal 10/04/2013  . PONV (postoperative nausea and vomiting)   . Sleep apnea     Past Surgical History:  Procedure Laterality Date  . [place]  . CHOLECYSTECTOMY  10/27/2011   Procedure: LAPAROSCOPIC CHOLECYSTECTOMY WITH INTRAOPERATIVE CHOLANGIOGRAM;  Surgeon: Adin Hector, MD;  [facility];  Service: General;  Laterality: N/A;  Laparoscopic Chole w/ IOC Single Site  . COLONOSCOPY  2019  . DILATATION & CURETTAGE/HYSTEROSCOPY WITH MYOSURE N/A 03/11/2020   Procedure: DILATATION AND CURETTAGE /HYSTEROSCOPY WITH MYOSURE;  Surgeon: Mora Bellman, MD;  Location: [place];  Service: Gynecology;  Laterality: N/A;  . DRUG INDUCED ENDOSCOPY    . EYE SURGERY  1`12-21-10   bil. for tissue growth-laser surgery  . HARDWARE  REMOVAL Left 03/16/2016   Procedure: LEFT ANKLE HARDWARE REMOVAL;  Surgeon: Leandrew Koyanagi, MD;  Location: [place];  Service: Orthopedics;  Laterality: Left;  Marland Kitchen MM BREAST STEREO BIOPSY LEFT ([place] HX) Left 2015  . ORIF ANKLE FRACTURE Left 11/13/2015   Procedure: OPEN REDUCTION INTERNAL FIXATION (ORIF) LEFT BIMALLEOLAR ANKLE FRACTURE;  Surgeon: Leandrew Koyanagi, MD;  Location: [place];  Service: Orthopedics;  Laterality: Left;  . uterine biopsy      There were no vitals filed for this visit.   Subjective Assessment - 06/18/20 1701    Subjective Reports doing well, no increase of pain.  I think it is all helping    Currently in Pain? Yes    Pain Score 3     Pain Location Back    Pain Orientation Lower    Aggravating Factors  standing and bending    Pain Relieving Factors the last treatment really helped                             [facility] Adult PT Treatment/Exercise - 06/18/20 0001      Lumbar Exercises: Stretches   Other Lumbar Stretch Exercise door knowb back stretch      Lumbar Exercises: Aerobic   Nustep L5 x 7 min      Lumbar Exercises: Machines for Strengthening  Other Lumbar Machine Exercise rows and lats 20# 2x10      Lumbar Exercises: Standing   Other Standing Lumbar Exercises red tband scapular stabilization rows and extensions gave to her for HEP    Other Standing Lumbar Exercises back to wall weighted ball lift overhead, W backs      Lumbar Exercises: Supine   Other Supine Lumbar Exercises feet on ball K2C, oblique, small bridge, isometric abs      Modalities   Modalities Electrical Stimulation;Moist Heat      Moist Heat Therapy   Number Minutes Moist Heat 10 Minutes    Moist Heat Location Lumbar Spine      Electrical Stimulation   Electrical Stimulation Location right rhomboids into the right lumbar area    Electrical Stimulation Action IFC    Electrical Stimulation Parameters supine    Electrical Stimulation Goals Pain       Manual Therapy   Manual Therapy Soft tissue mobilization;Passive ROM    Soft tissue mobilization use of some vibration to the right rhomboids and the right lumbar area                  PT Education - 06/18/20 1758    Education Details red tband HEP for scaular stability    Person(s) Educated Patient    Methods Explanation;Demonstration;Tactile cues;Verbal cues;Handout    Comprehension Verbalized understanding;Returned demonstration;Verbal cues required;Tactile cues required            PT Short Term Goals - 06/16/20 1735      PT SHORT TERM GOAL #1   Title Pt will be I and compliant with initial HEP.    Status Achieved      PT SHORT TERM GOAL #2   Title Pt will report decrease in pain by 50%.    Status Partially Met             PT Long Term Goals - 06/16/20 1735      PT LONG TERM GOAL #1   Title Pt will demonstrate pain free lumbar AROM WFL.    Status On-going                 Plan - 06/18/20 1757    Clinical Impression Statement Patient had some right LBP with the bridges today.  She remains tight and tender i the right rhomboid, I focused exercises here today, she liked the stretches, needed a lot of cues to do the scapular retractions    PT Next Visit Plan Progress mobility, stretching, and strength, see if we can get the pain down    Consulted and Agree with Plan of Care Patient           Patient will benefit from skilled therapeutic intervention in order to improve the following deficits and impairments:  Decreased range of motion, Increased fascial restricitons, Postural dysfunction, Decreased mobility, Decreased strength, Hypomobility, Impaired flexibility, Improper body mechanics, Pain, Impaired perceived functional ability  Visit Diagnosis: Chronic bilateral low back pain with left-sided sciatica  Upper back pain on right side  Muscle weakness (generalized)  Abnormal posture     Problem List Patient Active Problem List   Diagnosis  Date Noted  . Endometrial mass   . Snoring 07/05/2017  . OSA (obstructive sleep apnea) 07/05/2017  . 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 Left eye injury 11/23/2016  . Urinary incontinence in female 05/19/2016  . Bimalleolar fracture of left ankle 11/13/2015  . S/P ORIF (open reduction internal fixation) fracture 11/13/2015  .  Healthcare maintenance 10/08/2015  . Postmenopausal bleeding 10/08/2015  . Pain in joint, ankle and foot 10/08/2015  . Headache disorder 10/08/2015  . Atypical chest pain 10/08/2015  . Essential hypertension 10/08/2015  . Neck pain on right side 08/12/2015  . Grief reaction 03/09/2015  . Chronic thoracic spine pain 12/11/2014  . Depression (emotion) 08/12/2014  . Dyslipidemia 08/12/2014  . Headache(784.0) 07/17/2014  . UTI (urinary tract infection) 04/16/2014  . Cystocele with rectocele 04/16/2014  . Hyperlipidemia LDL goal < 100 01/22/2014  . Breast lump on left side at 12 o'clock position 10/22/2013  . Chronic constipation 10/09/2013  . Bone lesion 10/04/2013  . Mammogram abnormal 10/04/2013  . Abdominal pain, unspecified site 10/04/2013  . Unspecified essential hypertension 06/10/2013  . Chest pain, unspecified 06/10/2013  . Chronic throat pain 06/10/2013  . Back pain 11/14/2011  . Obesity (BMI 30-39.9) 11/14/2011  . GERD (gastroesophageal reflux disease) 09/19/2011  . Calculus of gallbladder with acute and chronic cholecystitis without obstruction, s/p lap chole 27Dec2012 09/19/2011  . Hemorrhoids, internal, with bleeding 09/19/2011  . ANEMIA-IRON DEFICIENCY 03/19/2009  . CHEST PAIN, ATYPICAL 03/19/2009    Sumner Boast., PT 06/18/2020, 5:59 PM  [address] [place], [place], [address] Phone: [phone]   Fax:  [phone]  Name: Kilynn Fitzsimmons MRN: [id number] Date of Birth: 1960/12/22

## 2020-06-23 ENCOUNTER — Encounter: Payer: Self-pay | Admitting: Physical Therapy

## 2020-06-23 ENCOUNTER — Ambulatory Visit: Payer: Self-pay | Admitting: Physical Therapy

## 2020-06-23 ENCOUNTER — Other Ambulatory Visit: Payer: Self-pay

## 2020-06-23 DIAGNOSIS — M6281 Muscle weakness (generalized): Secondary | ICD-10-CM

## 2020-06-23 DIAGNOSIS — M549 Dorsalgia, unspecified: Secondary | ICD-10-CM

## 2020-06-23 DIAGNOSIS — G8929 Other chronic pain: Secondary | ICD-10-CM

## 2020-06-23 DIAGNOSIS — R293 Abnormal posture: Secondary | ICD-10-CM

## 2020-06-23 NOTE — Patient Instructions (Signed)

## 2020-06-23 NOTE — Therapy (Signed)
[facility] [address] [place], [place], [address] Phone: [phone]   Fax:  [phone]  Physical Therapy Treatment  Patient Details  Name: Susan Lopez MRN: [id number] Date of Birth: 08/02/1961 Referring Provider (PT): Cammie Fulp, MD   Encounter Date: 06/23/2020   PT End of Session - 06/23/20 1755    Visit Number 6    Number of Visits 12    PT Start Time 1653    PT Stop Time 1755    PT Time Calculation (min) 62 min    Activity Tolerance Patient tolerated treatment well    Behavior During Therapy WFL for tasks assessed/performed           Past Medical History:  Diagnosis Date  . Allergy   . Anxiety   . Arthritis   . Bone lesion 10/04/2013  . Calculus of gallbladder with acute and chronic cholecystitis without obstruction, s/p lap chole 27Dec2012 09/19/2011  . Cataract 2009   bilateral  . Colon polyps   . Complication of anesthesia   . Depression   . Gallstones   . GERD (gastroesophageal reflux disease)   . Hemorrhoids, external 10-21-11   occ. bothersome  . Hyperlipidemia   . Hypertension   . Iron deficiency anemia   . Mammogram abnormal 10/04/2013  . PONV (postoperative nausea and vomiting)   . Sleep apnea     Past Surgical History:  Procedure Laterality Date  . CESAREAN SECTION  1985, 1992  . CHOLECYSTECTOMY  10/27/2011   Procedure: LAPAROSCOPIC CHOLECYSTECTOMY WITH INTRAOPERATIVE CHOLANGIOGRAM;  Surgeon: Steven C. Gross, MD;  Location: [facility];  Service: General;  Laterality: N/A;  Laparoscopic Chole w/ IOC Single Site  . COLONOSCOPY  2019  . DILATATION & CURETTAGE/HYSTEROSCOPY WITH MYOSURE N/A 03/11/2020   Procedure: DILATATION AND CURETTAGE /HYSTEROSCOPY WITH MYOSURE;  Surgeon: Constant, Peggy, MD;  Location: [facility];  Service: Gynecology;  Laterality: N/A;  . DRUG INDUCED ENDOSCOPY    . EYE SURGERY  1`12-21-10   bil. for tissue growth-laser surgery  . HARDWARE  REMOVAL Left 03/16/2016   Procedure: LEFT ANKLE HARDWARE REMOVAL;  Surgeon: Naiping M Xu, MD;  Location: [facility];  Service: Orthopedics;  Laterality: Left;  . MM BREAST STEREO BIOPSY LEFT ([facility] HX) Left 2015  . ORIF ANKLE FRACTURE Left 11/13/2015   Procedure: OPEN REDUCTION INTERNAL FIXATION (ORIF) LEFT BIMALLEOLAR ANKLE FRACTURE;  Surgeon: Naiping M Xu, MD;  Location: [facility];  Service: Orthopedics;  Laterality: Left;  . uterine biopsy      There were no vitals filed for this visit.   Subjective Assessment - 06/23/20 1700    Subjective A little tired, did some cooking, still pain and tightness in teh right rhomboid    Currently in Pain? Yes    Pain Score 3     Pain Location Back    Pain Orientation Right;Upper;Mid    Pain Descriptors / Indicators Aching;Spasm    Pain Relieving Factors the treatment really helps                             [facility] Adult PT Treatment/Exercise - 06/23/20 0001      Lumbar Exercises: Stretches   Other Lumbar Stretch Exercise B book opener x10 with 3 sec hold    Other Lumbar Stretch Exercise door knowb back stretch      Lumbar Exercises: Aerobic   Nustep L5 x 7   min      Lumbar Exercises: Machines for Strengthening   Other Lumbar Machine Exercise rows and lats 20# 2x10      Lumbar Exercises: Standing   Other Standing Lumbar Exercises red tband scapular stabilization rows and extensions gave to her for HEP    Other Standing Lumbar Exercises back to wall weighted ball lift overhead, W backs      Modalities   Modalities Electrical Stimulation;Moist Heat      Moist Heat Therapy   Number Minutes Moist Heat 10 Minutes    Moist Heat Location Shoulder      Electrical Stimulation   Electrical Stimulation Location right rhomboids into the right lumbar area    Electrical Stimulation Action IFC    Electrical Stimulation Parameters supine    Electrical Stimulation Goals Pain      Manual Therapy   Manual Therapy Soft  tissue mobilization;Passive ROM    Soft tissue mobilization use of some vibration to the right rhomboids and the right lumbar area                    PT Short Term Goals - 06/16/20 1735      PT SHORT TERM GOAL #1   Title Pt will be I and compliant with initial HEP.    Status Achieved      PT SHORT TERM GOAL #2   Title Pt will report decrease in pain by 50%.    Status Partially Met             PT Long Term Goals - 06/16/20 1735      PT LONG TERM GOAL #1   Title Pt will demonstrate pain free lumbar AROM WFL.    Status On-going                 Plan - 06/23/20 1755    Clinical Impression Statement Patient reports feeling better, still hurting in the right rhomboid and the upper trpa, she has significant spasms here, I tried some resisted breathing and this did not change her pain, I still feel it is all muscular in nature.    PT Next Visit Plan I educated in dry needling with interpreter, she verbalized understanding, we will try next visit    Consulted and Agree with Plan of Care Patient           Patient will benefit from skilled therapeutic intervention in order to improve the following deficits and impairments:  Decreased range of motion, Increased fascial restricitons, Postural dysfunction, Decreased mobility, Decreased strength, Hypomobility, Impaired flexibility, Improper body mechanics, Pain, Impaired perceived functional ability  Visit Diagnosis: Chronic bilateral low back pain with left-sided sciatica  Upper back pain on right side  Muscle weakness (generalized)  Abnormal posture     Problem List Patient Active Problem List   Diagnosis Date Noted  . Endometrial mass   . Snoring 07/05/2017  . OSA (obstructive sleep apnea) 07/05/2017  . 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 Left eye injury 11/23/2016  . Urinary incontinence in female 05/19/2016  . Bimalleolar fracture of left ankle 11/13/2015  . S/P ORIF (open reduction internal  fixation) fracture 11/13/2015  . Healthcare maintenance 10/08/2015  . Postmenopausal bleeding 10/08/2015  . Pain in joint, ankle and foot 10/08/2015  . Headache disorder 10/08/2015  . Atypical chest pain 10/08/2015  . Essential hypertension 10/08/2015  . Neck pain on right side 08/12/2015  . Grief reaction 03/09/2015  . Chronic thoracic spine pain 12/11/2014  .   Depression (emotion) 08/12/2014  . Dyslipidemia 08/12/2014  . Headache(784.0) 07/17/2014  . UTI (urinary tract infection) 04/16/2014  . Cystocele with rectocele 04/16/2014  . Hyperlipidemia LDL goal < 100 01/22/2014  . Breast lump on left side at 12 o'clock position 10/22/2013  . Chronic constipation 10/09/2013  . Bone lesion 10/04/2013  . Mammogram abnormal 10/04/2013  . Abdominal pain, unspecified site 10/04/2013  . Unspecified essential hypertension 06/10/2013  . Chest pain, unspecified 06/10/2013  . Chronic throat pain 06/10/2013  . Back pain 11/14/2011  . Obesity (BMI 30-39.9) 11/14/2011  . GERD (gastroesophageal reflux disease) 09/19/2011  . Calculus of gallbladder with acute and chronic cholecystitis without obstruction, s/p lap chole 27Dec2012 09/19/2011  . Hemorrhoids, internal, with bleeding 09/19/2011  . ANEMIA-IRON DEFICIENCY 03/19/2009  . CHEST PAIN, ATYPICAL 03/19/2009    Sumner Boast., PT 06/23/2020, 5:57 PM  [place] [address] [place], [place], [address] Phone: [phone]   Fax:  [phone]  Name: Susan Lopez MRN: [id number] Date of Birth: 1961-01-31

## 2020-06-25 ENCOUNTER — Ambulatory Visit: Payer: Self-pay | Admitting: Physical Therapy

## 2020-06-25 ENCOUNTER — Other Ambulatory Visit: Payer: Self-pay

## 2020-06-25 ENCOUNTER — Encounter: Payer: Self-pay | Admitting: Physical Therapy

## 2020-06-25 DIAGNOSIS — M5442 Lumbago with sciatica, left side: Secondary | ICD-10-CM

## 2020-06-25 DIAGNOSIS — R293 Abnormal posture: Secondary | ICD-10-CM

## 2020-06-25 DIAGNOSIS — M6281 Muscle weakness (generalized): Secondary | ICD-10-CM

## 2020-06-25 DIAGNOSIS — M549 Dorsalgia, unspecified: Secondary | ICD-10-CM

## 2020-06-25 NOTE — Therapy (Signed)
[place], [place], [address] Phone: [phone]   Fax:  [phone]  Physical Therapy Treatment  Patient Details  Name: Susan Lopez MRN: [id number] Date of Birth: 1960-11-11 Referring Provider (PT): Antony Blackbird, MD   Encounter Date: 06/25/2020   PT End of Session - 06/25/20 1740    Visit Number 7    Number of Visits 12    PT Start Time 5681    PT Stop Time 1754    PT Time Calculation (min) 49 min    Activity Tolerance Patient tolerated treatment well    Behavior During Therapy [facility] for tasks assessed/performed           Past Medical History:  Diagnosis Date  . Allergy   . Anxiety   . Arthritis   . Bone lesion 10/04/2013  . Calculus of gallbladder with acute and chronic cholecystitis without obstruction, s/p lap chole 27Dec2012 09/19/2011  . Cataract 2009   bilateral  . Colon polyps   . Complication of anesthesia   . Depression   . Gallstones   . GERD (gastroesophageal reflux disease)   . Hemorrhoids, external 10-21-11   occ. bothersome  . Hyperlipidemia   . Hypertension   . Iron deficiency anemia   . Mammogram abnormal 10/04/2013  . PONV (postoperative nausea and vomiting)   . Sleep apnea     Past Surgical History:  Procedure Laterality Date  . [place]  . CHOLECYSTECTOMY  10/27/2011   Procedure: LAPAROSCOPIC CHOLECYSTECTOMY WITH INTRAOPERATIVE CHOLANGIOGRAM;  Surgeon: Adin Hector, MD;  Location[facility];  Service: General;  Laterality: N/A;  Laparoscopic Chole w/ IOC Single Site  . COLONOSCOPY  2019  . DILATATION & CURETTAGE/HYSTEROSCOPY WITH MYOSURE N/A 03/11/2020   Procedure: DILATATION AND CURETTAGE /HYSTEROSCOPY WITH MYOSURE;  Surgeon: Mora Bellman, MD;  Location: [facility];  Service: Gynecology;  Laterality: N/A;  . DRUG INDUCED ENDOSCOPY    . EYE SURGERY  1`12-21-10   bil. for tissue growth-laser surgery  . HARDWARE  REMOVAL Left 03/16/2016   Procedure: LEFT ANKLE HARDWARE REMOVAL;  Surgeon: Leandrew Koyanagi, MD;  Location: [facility];  Service: Orthopedics;  Laterality: Left;  Marland Kitchen MM BREAST STEREO BIOPSY LEFT ([facility] HX) Left 2015  . ORIF ANKLE FRACTURE Left 11/13/2015   Procedure: OPEN REDUCTION INTERNAL FIXATION (ORIF) LEFT BIMALLEOLAR ANKLE FRACTURE;  Surgeon: Leandrew Koyanagi, MD;  Location: [facility];  Service: Orthopedics;  Laterality: Left;  . uterine biopsy      There were no vitals filed for this visit.   Subjective Assessment - 06/25/20 1708    Subjective Pt reports pain is a little better today than last time    Currently in Pain? Yes    Pain Score 5     Pain Location Back                             [facility] Adult PT Treatment/Exercise - 06/25/20 0001      Lumbar Exercises: Stretches   Passive Hamstring Stretch 4 reps;20 seconds    Single Knee to Chest Stretch 4 reps;20 seconds    Lower Trunk Rotation 4 reps;20 seconds    Other Lumbar Stretch Exercise mid thoracic stretch x20 sec, thoracic ext stretch over chair x5      Lumbar Exercises: Aerobic   Nustep L5 x 7 min  Lumbar Exercises: Machines for Strengthening   Other Lumbar Machine Exercise rows and lats 20# 2x10      Lumbar Exercises: Standing   Other Standing Lumbar Exercises green TB extensions x10    Other Standing Lumbar Exercises back to wall weighted ball lift overhead, W backs      Lumbar Exercises: Supine   Other Supine Lumbar Exercises feet on ball K2C, oblique, small bridge, isometric abs      Moist Heat Therapy   Number Minutes Moist Heat 10 Minutes    Moist Heat Location Shoulder      Electrical Stimulation   Electrical Stimulation Location right rhomboids into the right lumbar area    Electrical Stimulation Action IFC    Electrical Stimulation Parameters supine    Electrical Stimulation Goals Pain      Manual Therapy   Manual Therapy Soft tissue mobilization                     PT Short Term Goals - 06/16/20 1735      PT SHORT TERM GOAL #1   Title Pt will be I and compliant with initial HEP.    Status Achieved      PT SHORT TERM GOAL #2   Title Pt will report decrease in pain by 50%.    Status Partially Met             PT Long Term Goals - 06/16/20 1735      PT LONG TERM GOAL #1   Title Pt will demonstrate pain free lumbar AROM WFL.    Status On-going                 Plan - 06/25/20 1749    Clinical Impression Statement Pt presents to clinic reporting feeling a little better. Pt was able to tolerate machine interventions and stretching with no increase in pain. Reported lumbar/LE stretching was very helpful. Pt responded well to DN with strong twitch response.    PT Treatment/Interventions ADLs/Self Care Home Management;Spinal Manipulations;Joint Manipulations;Cryotherapy;Electrical Stimulation;Moist Heat;Therapeutic activities;Therapeutic exercise;Patient/family education;Neuromuscular re-education;Manual techniques;Passive range of motion    PT Next Visit Plan Assess response to DN, continue flexibility/strengthening, manual as indicated    Consulted and Agree with Plan of Care Patient           Patient will benefit from skilled therapeutic intervention in order to improve the following deficits and impairments:  Decreased range of motion, Increased fascial restricitons, Postural dysfunction, Decreased mobility, Decreased strength, Hypomobility, Impaired flexibility, Improper body mechanics, Pain, Impaired perceived functional ability  Visit Diagnosis: Chronic bilateral low back pain with left-sided sciatica  Upper back pain on right side  Muscle weakness (generalized)  Abnormal posture     Problem List Patient Active Problem List   Diagnosis Date Noted  . Endometrial mass   . Snoring 07/05/2017  . OSA (obstructive sleep apnea) 07/05/2017  . 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 Left eye injury  11/23/2016  . Urinary incontinence in female 05/19/2016  . Bimalleolar fracture of left ankle 11/13/2015  . S/P ORIF (open reduction internal fixation) fracture 11/13/2015  . Healthcare maintenance 10/08/2015  . Postmenopausal bleeding 10/08/2015  . Pain in joint, ankle and foot 10/08/2015  . Headache disorder 10/08/2015  . Atypical chest pain 10/08/2015  . Essential hypertension 10/08/2015  . Neck pain on right side 08/12/2015  . Grief reaction 03/09/2015  . Chronic thoracic spine pain 12/11/2014  . Depression (emotion) 08/12/2014  . Dyslipidemia 08/12/2014  .  Headache(784.0) 07/17/2014  . UTI (urinary tract infection) 04/16/2014  . Cystocele with rectocele 04/16/2014  . Hyperlipidemia LDL goal < 100 01/22/2014  . Breast lump on left side at 12 o'clock position 10/22/2013  . Chronic constipation 10/09/2013  . Bone lesion 10/04/2013  . Mammogram abnormal 10/04/2013  . Abdominal pain, unspecified site 10/04/2013  . Unspecified essential hypertension 06/10/2013  . Chest pain, unspecified 06/10/2013  . Chronic throat pain 06/10/2013  . Back pain 11/14/2011  . Obesity (BMI 30-39.9) 11/14/2011  . GERD (gastroesophageal reflux disease) 09/19/2011  . Calculus of gallbladder with acute and chronic cholecystitis without obstruction, s/p lap chole 27Dec2012 09/19/2011  . Hemorrhoids, internal, with bleeding 09/19/2011  . ANEMIA-IRON DEFICIENCY 03/19/2009  . CHEST PAIN, ATYPICAL 03/19/2009   Amador Cunas, PT, DPT Donald Prose Maryellen Dowdle 06/25/2020, 5:52 PM  [place] [address] [place], [place], [address] Phone: [phone]   Fax:  [phone]  Name: Susan Lopez MRN: [id number] Date of Birth: 1961/02/22

## 2020-06-30 ENCOUNTER — Encounter: Payer: Self-pay | Admitting: Physical Therapy

## 2020-06-30 ENCOUNTER — Ambulatory Visit: Payer: Self-pay | Admitting: Physical Therapy

## 2020-06-30 ENCOUNTER — Other Ambulatory Visit: Payer: Self-pay

## 2020-06-30 DIAGNOSIS — M6281 Muscle weakness (generalized): Secondary | ICD-10-CM

## 2020-06-30 DIAGNOSIS — R293 Abnormal posture: Secondary | ICD-10-CM

## 2020-06-30 DIAGNOSIS — G8929 Other chronic pain: Secondary | ICD-10-CM

## 2020-06-30 DIAGNOSIS — M549 Dorsalgia, unspecified: Secondary | ICD-10-CM

## 2020-06-30 NOTE — Therapy (Signed)
[place], [place], [address] Phone: [phone]   Fax:  [phone]  Physical Therapy Treatment  Patient Details  Name: Susan Lopez MRN: [id number] Date of Birth: [birth year] Referring Provider (PT): Antony Blackbird, MD   Encounter Date: 06/30/2020   PT End of Session - 06/30/20 1755    Visit Number 8    Number of Visits 12    PT Start Time 1700    PT Stop Time 1755    PT Time Calculation (min) 55 min    Activity Tolerance Patient tolerated treatment well    Behavior During Therapy [facility] for tasks assessed/performed           Past Medical History:  Diagnosis Date  . Allergy   . Anxiety   . Arthritis   . Bone lesion 10/04/2013  . Calculus of gallbladder with acute and chronic cholecystitis without obstruction, s/p lap chole 27Dec2012 09/19/2011  . Cataract 2009   bilateral  . Colon polyps   . Complication of anesthesia   . Depression   . Gallstones   . GERD (gastroesophageal reflux disease)   . Hemorrhoids, external 10-21-11   occ. bothersome  . Hyperlipidemia   . Hypertension   . Iron deficiency anemia   . Mammogram abnormal 10/04/2013  . PONV (postoperative nausea and vomiting)   . Sleep apnea     Past Surgical History:  Procedure Laterality Date  . [place]  . CHOLECYSTECTOMY  10/27/2011   Procedure: LAPAROSCOPIC CHOLECYSTECTOMY WITH INTRAOPERATIVE CHOLANGIOGRAM;  Surgeon: Adin Hector, MD;  [facility];  Service: General;  Laterality: N/A;  Laparoscopic Chole w/ IOC Single Site  . COLONOSCOPY  2019  . DILATATION & CURETTAGE/HYSTEROSCOPY WITH MYOSURE N/A 03/11/2020   Procedure: DILATATION AND CURETTAGE /HYSTEROSCOPY WITH MYOSURE;  Surgeon: Mora Bellman, MD;  Location: [facility];  Service: Gynecology;  Laterality: N/A;  . DRUG INDUCED ENDOSCOPY    . EYE SURGERY  1`12-21-10   bil. for tissue growth-laser surgery  . HARDWARE  REMOVAL Left 03/16/2016   Procedure: LEFT ANKLE HARDWARE REMOVAL;  Surgeon: Leandrew Koyanagi, MD;  Location: [place] [facility];  Service: Orthopedics;  Laterality: Left;  Marland Kitchen MM BREAST STEREO BIOPSY LEFT ([place] HX) Left 2015  . ORIF ANKLE FRACTURE Left 11/13/2015   Procedure: OPEN REDUCTION INTERNAL FIXATION (ORIF) LEFT BIMALLEOLAR ANKLE FRACTURE;  Surgeon: Leandrew Koyanagi, MD;  Location: [facility];  Service: Orthopedics;  Laterality: Left;  . uterine biopsy      There were no vitals filed for this visit.   Subjective Assessment - 06/30/20 1706    Subjective I am feeling bewtter after the dry needles    Currently in Pain? Yes    Pain Score 4     Pain Location Neck    Pain Orientation Right;Upper    Pain Descriptors / Indicators Aching;Spasm    Aggravating Factors  stress                             [facility] Adult PT Treatment/Exercise - 06/30/20 0001      Lumbar Exercises: Aerobic   Nustep L5 x 7 min      Lumbar Exercises: Machines for Strengthening   Other Lumbar Machine Exercise rows and lats 20# 2x10    Other Lumbar Machine Exercise 5# straight arm pulls cues for core and posture  Lumbar Exercises: Standing   Other Standing Lumbar Exercises green TB extensions x10    Other Standing Lumbar Exercises back to wall weighted ball lift overhead, W backs      Lumbar Exercises: Seated   Other Seated Lumbar Exercises 3# bent over row and extensions, 1# bent over reverse flies.      Moist Heat Therapy   Number Minutes Moist Heat 10 Minutes    Moist Heat Location Shoulder      Electrical Stimulation   Electrical Stimulation Location right rhomboids into the right lumbar area    Electrical Stimulation Action IFC    Electrical Stimulation Parameters supine    Electrical Stimulation Goals Pain      Manual Therapy   Manual Therapy Soft tissue mobilization    Soft tissue mobilization use of some vibration to the right rhomboids and the right lumbar area                     PT Short Term Goals - 06/16/20 1735      PT SHORT TERM GOAL #1   Title Pt will be I and compliant with initial HEP.    Status Achieved      PT SHORT TERM GOAL #2   Title Pt will report decrease in pain by 50%.    Status Partially Met             PT Long Term Goals - 06/16/20 1735      PT LONG TERM GOAL #1   Title Pt will demonstrate pain free lumbar AROM WFL.    Status On-going                 Plan - 06/30/20 1755    Clinical Impression Statement Patient reports that she is having less and less pain, reports that she is still very sore and tight in the right upper trap and the right rhomboid area.  We added some different exercises to address this area with strength and talked with her about posture.    PT Next Visit Plan continue to work on strength and posture and try to decrease the pain and spasm    Consulted and Agree with Plan of Care Patient           Patient will benefit from skilled therapeutic intervention in order to improve the following deficits and impairments:  Decreased range of motion, Increased fascial restricitons, Postural dysfunction, Decreased mobility, Decreased strength, Hypomobility, Impaired flexibility, Improper body mechanics, Pain, Impaired perceived functional ability  Visit Diagnosis: Chronic bilateral low back pain with left-sided sciatica  Upper back pain on right side  Muscle weakness (generalized)  Abnormal posture     Problem List Patient Active Problem List   Diagnosis Date Noted  . Endometrial mass   . Snoring 07/05/2017  . OSA (obstructive sleep apnea) 07/05/2017  . 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 Left eye injury 11/23/2016  . Urinary incontinence in female 05/19/2016  . Bimalleolar fracture of left ankle 11/13/2015  . S/P ORIF (open reduction internal fixation) fracture 11/13/2015  . Healthcare maintenance 10/08/2015  . Postmenopausal bleeding 10/08/2015  . Pain in joint, ankle  and foot 10/08/2015  . Headache disorder 10/08/2015  . Atypical chest pain 10/08/2015  . Essential hypertension 10/08/2015  . Neck pain on right side 08/12/2015  . Grief reaction 03/09/2015  . Chronic thoracic spine pain 12/11/2014  . Depression (emotion) 08/12/2014  . Dyslipidemia 08/12/2014  . Headache(784.0) 07/17/2014  . UTI (urinary  tract infection) 04/16/2014  . Cystocele with rectocele 04/16/2014  . Hyperlipidemia LDL goal < 100 01/22/2014  . Breast lump on left side at 12 o'clock position 10/22/2013  . Chronic constipation 10/09/2013  . Bone lesion 10/04/2013  . Mammogram abnormal 10/04/2013  . Abdominal pain, unspecified site 10/04/2013  . Unspecified essential hypertension 06/10/2013  . Chest pain, unspecified 06/10/2013  . Chronic throat pain 06/10/2013  . Back pain 11/14/2011  . Obesity (BMI 30-39.9) 11/14/2011  . GERD (gastroesophageal reflux disease) 09/19/2011  . Calculus of gallbladder with acute and chronic cholecystitis without obstruction, s/p lap chole 27Dec2012 09/19/2011  . Hemorrhoids, internal, with bleeding 09/19/2011  . ANEMIA-IRON DEFICIENCY 03/19/2009  . CHEST PAIN, ATYPICAL 03/19/2009    Sumner Boast., PT 06/30/2020, 6:02 PM  [place] [address] [place], [place], [address] Phone: [phone]   Fax:  [phone]  Name: Susan Lopez MRN: [id number] Date of Birth: [birth year]

## 2020-07-02 ENCOUNTER — Other Ambulatory Visit: Payer: Self-pay

## 2020-07-02 ENCOUNTER — Encounter: Payer: Self-pay | Admitting: Physical Therapy

## 2020-07-02 ENCOUNTER — Ambulatory Visit: Payer: Self-pay | Attending: Family Medicine | Admitting: Physical Therapy

## 2020-07-02 DIAGNOSIS — M6281 Muscle weakness (generalized): Secondary | ICD-10-CM | POA: Insufficient documentation

## 2020-07-02 DIAGNOSIS — R293 Abnormal posture: Secondary | ICD-10-CM | POA: Insufficient documentation

## 2020-07-02 DIAGNOSIS — M549 Dorsalgia, unspecified: Secondary | ICD-10-CM | POA: Insufficient documentation

## 2020-07-02 DIAGNOSIS — G8929 Other chronic pain: Secondary | ICD-10-CM | POA: Insufficient documentation

## 2020-07-02 DIAGNOSIS — M5442 Lumbago with sciatica, left side: Secondary | ICD-10-CM | POA: Insufficient documentation

## 2020-07-02 NOTE — Therapy (Signed)
[place], [place], [address] Phone: [phone]   Fax:  [phone]  Physical Therapy Treatment  Patient Details  Name: Susan Lopez MRN: [id number] Date of Birth: 09-28-1961 Referring Provider (PT): Antony Blackbird, MD   Encounter Date: 07/02/2020   PT End of Session - 07/02/20 1743    Visit Number 9    Number of Visits 12    PT Start Time 1700    PT Stop Time 1448    PT Time Calculation (min) 53 min    Activity Tolerance Patient tolerated treatment well    Behavior During Therapy [facility] for tasks assessed/performed           Past Medical History:  Diagnosis Date  . Allergy   . Anxiety   . Arthritis   . Bone lesion 10/04/2013  . Calculus of gallbladder with acute and chronic cholecystitis without obstruction, s/p lap chole 27Dec2012 09/19/2011  . Cataract 2009   bilateral  . Colon polyps   . Complication of anesthesia   . Depression   . Gallstones   . GERD (gastroesophageal reflux disease)   . Hemorrhoids, external 10-21-11   occ. bothersome  . Hyperlipidemia   . Hypertension   . Iron deficiency anemia   . Mammogram abnormal 10/04/2013  . PONV (postoperative nausea and vomiting)   . Sleep apnea     Past Surgical History:  Procedure Laterality Date  . [place]  . CHOLECYSTECTOMY  10/27/2011   Procedure: LAPAROSCOPIC CHOLECYSTECTOMY WITH INTRAOPERATIVE CHOLANGIOGRAM;  Surgeon: Adin Hector, MD;  [facility];  Service: General;  Laterality: N/A;  Laparoscopic Chole w/ IOC Single Site  . COLONOSCOPY  2019  . DILATATION & CURETTAGE/HYSTEROSCOPY WITH MYOSURE N/A 03/11/2020   Procedure: DILATATION AND CURETTAGE /HYSTEROSCOPY WITH MYOSURE;  Surgeon: Mora Bellman, MD;  Location: [place];  Service: Gynecology;  Laterality: N/A;  . DRUG INDUCED ENDOSCOPY    . EYE SURGERY  1`12-21-10   bil. for tissue growth-laser surgery  . HARDWARE  REMOVAL Left 03/16/2016   Procedure: LEFT ANKLE HARDWARE REMOVAL;  Surgeon: Leandrew Koyanagi, MD;  Location: [place];  Service: Orthopedics;  Laterality: Left;  Marland Kitchen MM BREAST STEREO BIOPSY LEFT ([place] HX) Left 2015  . ORIF ANKLE FRACTURE Left 11/13/2015   Procedure: OPEN REDUCTION INTERNAL FIXATION (ORIF) LEFT BIMALLEOLAR ANKLE FRACTURE;  Surgeon: Leandrew Koyanagi, MD;  Location: [place];  Service: Orthopedics;  Laterality: Left;  . uterine biopsy      There were no vitals filed for this visit.   Subjective Assessment - 07/02/20 1701    Subjective I am feeling better.  I think everything is helping    Currently in Pain? Yes    Pain Score 3     Pain Location Back    Pain Orientation Upper;Right    Pain Descriptors / Indicators Spasm;Sore    Pain Relieving Factors all the treatments help                             [facility] Adult PT Treatment/Exercise - 07/02/20 0001      Lumbar Exercises: Aerobic   UBE (Upper Arm Bike) level 4 x 5 minutes      Lumbar Exercises: Machines for Strengthening   Other Lumbar Machine Exercise rows and lats 20# 2x10    Other Lumbar Machine Exercise 5# straight arm pulls  cues for core and posture      Lumbar Exercises: Supine   Other Supine Lumbar Exercises feet on ball K2C, oblique, small bridge, isometric abs      Moist Heat Therapy   Number Minutes Moist Heat 10 Minutes    Moist Heat Location Shoulder      Electrical Stimulation   Electrical Stimulation Location right rhomboids into the right lumbar area    Electrical Stimulation Action IFC    Electrical Stimulation Parameters supine    Electrical Stimulation Goals Pain      Manual Therapy   Manual Therapy Soft tissue mobilization    Soft tissue mobilization use of some vibration to the right rhomboids and the right lumbar area    Passive ROM gentle rotational mobs of the thoracic spine, gentle resisted breathing                    PT Short Term Goals - 06/16/20  1735      PT SHORT TERM GOAL #1   Title Pt will be I and compliant with initial HEP.    Status Achieved      PT SHORT TERM GOAL #2   Title Pt will report decrease in pain by 50%.    Status Partially Met             PT Long Term Goals - 07/02/20 1746      PT LONG TERM GOAL #1   Title Pt will demonstrate pain free lumbar AROM WFL.    Status Partially Met      PT LONG TERM GOAL #2   Title Pt will demonstrate neutral lumbopelvic alignment in multiple positions with no pain.    Status Partially Met                 Plan - 07/02/20 1744    Clinical Impression Statement Patient had more pain in the rhomobid area, I worked predominately here, gave her a stretch to do at home with the door knob to get some rhomboid and mid back stretches.  she does have some significant spasms here    PT Next Visit Plan continue to work on strength and posture and try to decrease the pain and spasm    Consulted and Agree with Plan of Care Patient           Patient will benefit from skilled therapeutic intervention in order to improve the following deficits and impairments:  Decreased range of motion, Increased fascial restricitons, Postural dysfunction, Decreased mobility, Decreased strength, Hypomobility, Impaired flexibility, Improper body mechanics, Pain, Impaired perceived functional ability  Visit Diagnosis: Chronic bilateral low back pain with left-sided sciatica  Upper back pain on right side  Muscle weakness (generalized)  Abnormal posture     Problem List Patient Active Problem List   Diagnosis Date Noted  . Endometrial mass   . Snoring 07/05/2017  . OSA (obstructive sleep apnea) 07/05/2017  . 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 Left eye injury 11/23/2016  . Urinary incontinence in female 05/19/2016  . Bimalleolar fracture of left ankle 11/13/2015  . S/P ORIF (open reduction internal fixation) fracture 11/13/2015  . Healthcare maintenance 10/08/2015  .  Postmenopausal bleeding 10/08/2015  . Pain in joint, ankle and foot 10/08/2015  . Headache disorder 10/08/2015  . Atypical chest pain 10/08/2015  . Essential hypertension 10/08/2015  . Neck pain on right side 08/12/2015  . Grief reaction 03/09/2015  . Chronic thoracic spine pain 12/11/2014  . Depression (  emotion) 08/12/2014  . Dyslipidemia 08/12/2014  . Headache(784.0) 07/17/2014  . UTI (urinary tract infection) 04/16/2014  . Cystocele with rectocele 04/16/2014  . Hyperlipidemia LDL goal < 100 01/22/2014  . Breast lump on left side at 12 o'clock position 10/22/2013  . Chronic constipation 10/09/2013  . Bone lesion 10/04/2013  . Mammogram abnormal 10/04/2013  . Abdominal pain, unspecified site 10/04/2013  . Unspecified essential hypertension 06/10/2013  . Chest pain, unspecified 06/10/2013  . Chronic throat pain 06/10/2013  . Back pain 11/14/2011  . Obesity (BMI 30-39.9) 11/14/2011  . GERD (gastroesophageal reflux disease) 09/19/2011  . Calculus of gallbladder with acute and chronic cholecystitis without obstruction, s/p lap chole 27Dec2012 09/19/2011  . Hemorrhoids, internal, with bleeding 09/19/2011  . ANEMIA-IRON DEFICIENCY 03/19/2009  . CHEST PAIN, ATYPICAL 03/19/2009    Sumner Boast., PT 07/02/2020, 5:47 PM  [place] [address] [place], [place], [address] Phone: [phone]   Fax:  [phone]  Name: Sarrinah Gardin MRN: [id number] Date of Birth: [birth year]

## 2020-07-07 ENCOUNTER — Encounter: Payer: Self-pay | Admitting: Physical Therapy

## 2020-07-07 ENCOUNTER — Other Ambulatory Visit: Payer: Self-pay

## 2020-07-07 ENCOUNTER — Ambulatory Visit: Payer: Self-pay | Admitting: Physical Therapy

## 2020-07-07 DIAGNOSIS — M549 Dorsalgia, unspecified: Secondary | ICD-10-CM

## 2020-07-07 DIAGNOSIS — G8929 Other chronic pain: Secondary | ICD-10-CM

## 2020-07-07 DIAGNOSIS — R293 Abnormal posture: Secondary | ICD-10-CM

## 2020-07-07 DIAGNOSIS — M6281 Muscle weakness (generalized): Secondary | ICD-10-CM

## 2020-07-07 NOTE — Therapy (Signed)
[place], [place], [address] Phone: [phone]   Fax:  [phone]  Physical Therapy Treatment  Patient Details  Name: Susan Lopez MRN: [id number] Date of Birth: Mar 07, 1961 Referring Provider (PT): Antony Blackbird, MD   Encounter Date: 07/07/2020   PT End of Session - 07/07/20 1647    Visit Number 10    PT Start Time 3545    PT Stop Time 1703    PT Time Calculation (min) 48 min    Activity Tolerance Patient tolerated treatment well    Behavior During Therapy [facility] for tasks assessed/performed           Past Medical History:  Diagnosis Date  . Allergy   . Anxiety   . Arthritis   . Bone lesion 10/04/2013  . Calculus of gallbladder with acute and chronic cholecystitis without obstruction, s/p lap chole 27Dec2012 09/19/2011  . Cataract 2009   bilateral  . Colon polyps   . Complication of anesthesia   . Depression   . Gallstones   . GERD (gastroesophageal reflux disease)   . Hemorrhoids, external 10-21-11   occ. bothersome  . Hyperlipidemia   . Hypertension   . Iron deficiency anemia   . Mammogram abnormal 10/04/2013  . PONV (postoperative nausea and vomiting)   . Sleep apnea     Past Surgical History:  Procedure Laterality Date  . [place]  . CHOLECYSTECTOMY  10/27/2011   Procedure: LAPAROSCOPIC CHOLECYSTECTOMY WITH INTRAOPERATIVE CHOLANGIOGRAM;  Surgeon: Adin Hector, MD;  [facility];  Service: General;  Laterality: N/A;  Laparoscopic Chole w/ IOC Single Site  . COLONOSCOPY  2019  . DILATATION & CURETTAGE/HYSTEROSCOPY WITH MYOSURE N/A 03/11/2020   Procedure: DILATATION AND CURETTAGE /HYSTEROSCOPY WITH MYOSURE;  Surgeon: Mora Bellman, MD;  Location: [place];  Service: Gynecology;  Laterality: N/A;  . DRUG INDUCED ENDOSCOPY    . EYE SURGERY  1`12-21-10   bil. for tissue growth-laser surgery  . HARDWARE REMOVAL Left 03/16/2016    Procedure: LEFT ANKLE HARDWARE REMOVAL;  Surgeon: Leandrew Koyanagi, MD;  Location: [place];  Service: Orthopedics;  Laterality: Left;  Marland Kitchen MM BREAST STEREO BIOPSY LEFT ([place] HX) Left 2015  . ORIF ANKLE FRACTURE Left 11/13/2015   Procedure: OPEN REDUCTION INTERNAL FIXATION (ORIF) LEFT BIMALLEOLAR ANKLE FRACTURE;  Surgeon: Leandrew Koyanagi, MD;  Location: [place];  Service: Orthopedics;  Laterality: Left;  . uterine biopsy      There were no vitals filed for this visit.   Subjective Assessment - 07/07/20 1619    Subjective I continue to do better, still feel tight and some pain at times in the right shoulder blade    Currently in Pain? Yes    Pain Location Back    Pain Orientation Right;Upper    Aggravating Factors  moving arms alot , able to do hair and dress ithout pain                             [facility] Adult PT Treatment/Exercise - 07/07/20 0001      Lumbar Exercises: Stretches   Other Lumbar Stretch Exercise cross body stretch, lat stretch and rhomboid stretches      Lumbar Exercises: Aerobic   UBE (Upper Arm Bike) level 5 x 5 minutes      Lumbar Exercises: Machines for Strengthening   Other Lumbar Machine Exercise  rows and lats 20# 2x10    Other Lumbar Machine Exercise 5# straight arm pulls cues for core and posture      Lumbar Exercises: Standing   Other Standing Lumbar Exercises back to wall weighted ball lift overhead, W backs      Lumbar Exercises: Seated   Other Seated Lumbar Exercises 3# bent over row and extensions, 1# bent over reverse flies.      Lumbar Exercises: Supine   Other Supine Lumbar Exercises feet on ball K2C, oblique, small bridge, isometric abs      Manual Therapy   Manual Therapy Soft tissue mobilization    Soft tissue mobilization use of some vibration to the right rhomboids and the right lumbar area                    PT Short Term Goals - 06/16/20 1735      PT SHORT TERM GOAL #1   Title Pt will be I and  compliant with initial HEP.    Status Achieved      PT SHORT TERM GOAL #2   Title Pt will report decrease in pain by 50%.    Status Partially Met             PT Long Term Goals - 07/07/20 1649      PT LONG TERM GOAL #1   Title Pt will demonstrate pain free lumbar AROM WFL.    Status Partially Met                 Plan - 07/07/20 1648    Clinical Impression Statement Patietn still very tight and tender in the right rhomboid, she is moving better and is appearing stronger, seems to have overall less pain.  did not do DN today    PT Next Visit Plan continue to work on strength and posture and try to decrease the pain and spasm    Consulted and Agree with Plan of Care Patient           Patient will benefit from skilled therapeutic intervention in order to improve the following deficits and impairments:  Decreased range of motion, Increased fascial restricitons, Postural dysfunction, Decreased mobility, Decreased strength, Hypomobility, Impaired flexibility, Improper body mechanics, Pain, Impaired perceived functional ability  Visit Diagnosis: Chronic bilateral low back pain with left-sided sciatica  Upper back pain on right side  Muscle weakness (generalized)  Abnormal posture     Problem List Patient Active Problem List   Diagnosis Date Noted  . Endometrial mass   . Snoring 07/05/2017  . OSA (obstructive sleep apnea) 07/05/2017  . 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 Left eye injury 11/23/2016  . Urinary incontinence in female 05/19/2016  . Bimalleolar fracture of left ankle 11/13/2015  . S/P ORIF (open reduction internal fixation) fracture 11/13/2015  . Healthcare maintenance 10/08/2015  . Postmenopausal bleeding 10/08/2015  . Pain in joint, ankle and foot 10/08/2015  . Headache disorder 10/08/2015  . Atypical chest pain 10/08/2015  . Essential hypertension 10/08/2015  . Neck pain on right side 08/12/2015  . Grief reaction 03/09/2015  . Chronic  thoracic spine pain 12/11/2014  . Depression (emotion) 08/12/2014  . Dyslipidemia 08/12/2014  . Headache(784.0) 07/17/2014  . UTI (urinary tract infection) 04/16/2014  . Cystocele with rectocele 04/16/2014  . Hyperlipidemia LDL goal < 100 01/22/2014  . Breast lump on left side at 12 o'clock position 10/22/2013  . Chronic constipation 10/09/2013  . Bone lesion 10/04/2013  .  Mammogram abnormal 10/04/2013  . Abdominal pain, unspecified site 10/04/2013  . Unspecified essential hypertension 06/10/2013  . Chest pain, unspecified 06/10/2013  . Chronic throat pain 06/10/2013  . Back pain 11/14/2011  . Obesity (BMI 30-39.9) 11/14/2011  . GERD (gastroesophageal reflux disease) 09/19/2011  . Calculus of gallbladder with acute and chronic cholecystitis without obstruction, s/p lap chole 27Dec2012 09/19/2011  . Hemorrhoids, internal, with bleeding 09/19/2011  . ANEMIA-IRON DEFICIENCY 03/19/2009  . CHEST PAIN, ATYPICAL 03/19/2009    Sumner Boast., PT 07/07/2020, 4:49 PM  [place] [address] [place] [address] [place], [place], [address] Phone: [phone]   Fax:  [phone]  Name: Susan Lopez MRN: [id number] Date of Birth: [birth year]

## 2020-07-09 ENCOUNTER — Ambulatory Visit: Payer: Self-pay | Admitting: Physical Therapy

## 2020-07-09 ENCOUNTER — Other Ambulatory Visit: Payer: Self-pay

## 2020-07-09 DIAGNOSIS — M549 Dorsalgia, unspecified: Secondary | ICD-10-CM

## 2020-07-09 DIAGNOSIS — M6281 Muscle weakness (generalized): Secondary | ICD-10-CM

## 2020-07-09 NOTE — Therapy (Signed)
[place], [place], [address] Phone: [phone]   Fax:  [phone]  Physical Therapy Treatment  Patient Details  Name: Genesys Coggeshall MRN: [id number] Date of Birth: 05/08/1961 Referring Provider (PT): Antony Blackbird, MD   Encounter Date: 07/09/2020   PT End of Session - 07/09/20 1735    Visit Number 11    PT Start Time 3818    PT Stop Time 2993    PT Time Calculation (min) 50 min           Past Medical History:  Diagnosis Date  . Allergy   . Anxiety   . Arthritis   . Bone lesion 10/04/2013  . Calculus of gallbladder with acute and chronic cholecystitis without obstruction, s/p lap chole 27Dec2012 09/19/2011  . Cataract 2009   bilateral  . Colon polyps   . Complication of anesthesia   . Depression   . Gallstones   . GERD (gastroesophageal reflux disease)   . Hemorrhoids, external 10-21-11   occ. bothersome  . Hyperlipidemia   . Hypertension   . Iron deficiency anemia   . Mammogram abnormal 10/04/2013  . PONV (postoperative nausea and vomiting)   . Sleep apnea     Past Surgical History:  Procedure Laterality Date  . [place]  . CHOLECYSTECTOMY  10/27/2011   Procedure: LAPAROSCOPIC CHOLECYSTECTOMY WITH INTRAOPERATIVE CHOLANGIOGRAM;  Surgeon: Adin Hector, MD;  [facility];  Service: General;  Laterality: N/A;  Laparoscopic Chole w/ IOC Single Site  . COLONOSCOPY  2019  . DILATATION & CURETTAGE/HYSTEROSCOPY WITH MYOSURE N/A 03/11/2020   Procedure: DILATATION AND CURETTAGE /HYSTEROSCOPY WITH MYOSURE;  Surgeon: Mora Bellman, MD;  Location: [facility];  Service: Gynecology;  Laterality: N/A;  . DRUG INDUCED ENDOSCOPY    . EYE SURGERY  1`12-21-10   bil. for tissue growth-laser surgery  . HARDWARE REMOVAL Left 03/16/2016   Procedure: LEFT ANKLE HARDWARE REMOVAL;  Surgeon: Leandrew Koyanagi, MD;  Location: [place];  Service:  Orthopedics;  Laterality: Left;  Marland Kitchen MM BREAST STEREO BIOPSY LEFT ([place] HX) Left 2015  . ORIF ANKLE FRACTURE Left 11/13/2015   Procedure: OPEN REDUCTION INTERNAL FIXATION (ORIF) LEFT BIMALLEOLAR ANKLE FRACTURE;  Surgeon: Leandrew Koyanagi, MD;  Location: [place];  Service: Orthopedics;  Laterality: Left;  . uterine biopsy      There were no vitals filed for this visit.   Subjective Assessment - 07/09/20 1706    Subjective feeling better just alittle pain    Patient is accompained by: Interpreter    Currently in Pain? Yes    Pain Score 3     Pain Location Back    Pain Orientation Right;Upper                             [facility] Adult PT Treatment/Exercise - 07/09/20 0001      Lumbar Exercises: Aerobic   UBE (Upper Arm Bike) 3 min fwd/ 3 min back L 4      Lumbar Exercises: Machines for Strengthening   Other Lumbar Machine Exercise rows and lats 20# 2x15    Other Lumbar Machine Exercise upright row 15# 2 sets 10   pulley shld ext, row and abd 10x     Lumbar Exercises: Standing   Other Standing Lumbar Exercises back to wall weighted ball lift overhead, W backs  Lumbar Exercises: Seated   Other Seated Lumbar Exercises 3# bent over row and extensions, 3# bent over reverse flies.      Moist Heat Therapy   Number Minutes Moist Heat 12 Minutes    Moist Heat Location Shoulder      Electrical Stimulation   Electrical Stimulation Location right rhomboids into the right lumbar area    Electrical Stimulation Action IFC    Electrical Stimulation Parameters supne    Electrical Stimulation Goals Pain      Manual Therapy   Manual Therapy Soft tissue mobilization    Soft tissue mobilization deep STW and TP release in RT rhomboid                    PT Short Term Goals - 06/16/20 1735      PT SHORT TERM GOAL #1   Title Pt will be I and compliant with initial HEP.    Status Achieved      PT SHORT TERM GOAL #2   Title Pt will report decrease in pain by 50%.     Status Partially Met             PT Long Term Goals - 07/07/20 1649      PT LONG TERM GOAL #1   Title Pt will demonstrate pain free lumbar AROM WFL.    Status Partially Met                 Plan - 07/09/20 1736    Clinical Impression Statement able to progress ex with postural cuing needed with no c/o pain just fatigue. TP noted in RT rhomboid. overall pt continues to see improvements with PT interventions.    PT Treatment/Interventions ADLs/Self Care Home Management;Spinal Manipulations;Joint Manipulations;Cryotherapy;Electrical Stimulation;Moist Heat;Therapeutic activities;Therapeutic exercise;Patient/family education;Neuromuscular re-education;Manual techniques;Passive range of motion    PT Next Visit Plan continue to work on strength and posture and try to decrease the pain and spasm           Patient will benefit from skilled therapeutic intervention in order to improve the following deficits and impairments:  Decreased range of motion, Increased fascial restricitons, Postural dysfunction, Decreased mobility, Decreased strength, Hypomobility, Impaired flexibility, Improper body mechanics, Pain, Impaired perceived functional ability  Visit Diagnosis: Upper back pain on right side  Muscle weakness (generalized)     Problem List Patient Active Problem List   Diagnosis Date Noted  . Endometrial mass   . Snoring 07/05/2017  . OSA (obstructive sleep apnea) 07/05/2017  . 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 Left eye injury 11/23/2016  . Urinary incontinence in female 05/19/2016  . Bimalleolar fracture of left ankle 11/13/2015  . S/P ORIF (open reduction internal fixation) fracture 11/13/2015  . Healthcare maintenance 10/08/2015  . Postmenopausal bleeding 10/08/2015  . Pain in joint, ankle and foot 10/08/2015  . Headache disorder 10/08/2015  . Atypical chest pain 10/08/2015  . Essential hypertension 10/08/2015  . Neck pain on right side 08/12/2015  .  Grief reaction 03/09/2015  . Chronic thoracic spine pain 12/11/2014  . Depression (emotion) 08/12/2014  . Dyslipidemia 08/12/2014  . Headache(784.0) 07/17/2014  . UTI (urinary tract infection) 04/16/2014  . Cystocele with rectocele 04/16/2014  . Hyperlipidemia LDL goal < 100 01/22/2014  . Breast lump on left side at 12 o'clock position 10/22/2013  . Chronic constipation 10/09/2013  . Bone lesion 10/04/2013  . Mammogram abnormal 10/04/2013  . Abdominal pain, unspecified site 10/04/2013  . Unspecified essential hypertension 06/10/2013  .  Chest pain, unspecified 06/10/2013  . Chronic throat pain 06/10/2013  . Back pain 11/14/2011  . Obesity (BMI 30-39.9) 11/14/2011  . GERD (gastroesophageal reflux disease) 09/19/2011  . Calculus of gallbladder with acute and chronic cholecystitis without obstruction, s/p lap chole 27Dec2012 09/19/2011  . Hemorrhoids, internal, with bleeding 09/19/2011  . ANEMIA-IRON DEFICIENCY 03/19/2009  . CHEST PAIN, ATYPICAL 03/19/2009    Charese Abundis,ANGIE PTA 07/09/2020, 5:38 PM  [place] [address] [place], [place], [address] Phone: [phone]   Fax:  [phone]  Name: Denesha Brouse MRN: [id number] Date of Birth: Mar 12, 1961

## 2020-07-13 ENCOUNTER — Other Ambulatory Visit: Payer: Self-pay | Admitting: Family Medicine

## 2020-07-13 DIAGNOSIS — K219 Gastro-esophageal reflux disease without esophagitis: Secondary | ICD-10-CM

## 2020-07-13 MED FILL — OMEPRAZOLE DR 40 MG CAPSULE: 40 | 30 days supply | Qty: 60 | Fill #0

## 2020-07-13 MED FILL — MELOXICAM 7.5 MG TABLET: 7.5 | 30 days supply | Qty: 30 | Fill #0

## 2020-07-13 MED FILL — LISINOPRIL 10 MG TABS: 10 | 30 days supply | Qty: 30 | Fill #1

## 2020-07-13 MED FILL — MEGESTROL 40 MG TABLET: 40 | 15 days supply | Qty: 60 | Fill #1

## 2020-07-13 NOTE — Telephone Encounter (Signed)
Requested Prescriptions  Pending Prescriptions Disp Refills   omeprazole (PRILOSEC) 40 MG capsule [Pharmacy Med Name: OMEPRAZOLE DR 40 MG CAPSULE 40 Capsule] 180 capsule 1    Sig: TOME 1 PASTILLA POR VIA ORAL DOS VECES AL DIA A REDUCIR EL ACIDO DEL [place]     Gastroenterology: Proton Pump Inhibitors Passed - 07/13/2020  3:59 PM      Passed - Valid encounter within last 12 months    Recent Outpatient Visits          1 month ago Chronic thoracic spine pain   [place], [place], NP   7 months ago Epigastric pain   [place], MD   10 months ago Adnexal pain   [facility] [place], [place], MD   1 year ago Essential hypertension   [facility] [place], [place], MD   2 years ago Essential hypertension   [place], Marlena [place], MD

## 2020-08-21 MED FILL — OMEPRAZOLE DR 40 MG CAPSULE: 40 | 30 days supply | Qty: 60 | Fill #1

## 2020-08-21 MED FILL — MEGESTROL 40 MG TABLET: 40 | 15 days supply | Qty: 60 | Fill #2

## 2020-08-21 MED FILL — LISINOPRIL 10 MG TABS: 10 | 30 days supply | Qty: 30 | Fill #2

## 2020-08-24 ENCOUNTER — Telehealth: Payer: Self-pay | Admitting: *Deleted

## 2020-08-24 NOTE — Telephone Encounter (Signed)
Patient left a voicemail this afternoon stating call me please. Needs Spanish interpreter.  Terria Deschepper,RN

## 2020-08-25 NOTE — Telephone Encounter (Signed)
Called pt with [employer] # [phone] and pt informed me that she had a procedure in May and was prescribed a medication for vaginal bleeding however she is now having bleeding and was advised that if her bleeding came back to schedule an appt.  Pt stated that she is not bleeding heavy just moderate bleeding that she is having.   I advised pt to continue to taking the Megace po bid and someone from the front office will call her to schedule an appt with [facility].  Pt verbalized understanding.   Mel Almond, RN  08/25/20

## 2020-09-14 ENCOUNTER — Ambulatory Visit: Payer: Self-pay | Admitting: Family Medicine

## 2020-09-14 ENCOUNTER — Ambulatory Visit: Payer: Self-pay

## 2020-09-14 ENCOUNTER — Other Ambulatory Visit: Payer: Self-pay

## 2020-09-14 DIAGNOSIS — Z20822 Contact with and (suspected) exposure to covid-19: Secondary | ICD-10-CM

## 2020-09-15 LAB — SARS-COV-2, NAA 2 DAY TAT

## 2020-09-15 LAB — NOVEL CORONAVIRUS, NAA: SARS-CoV-2, NAA: NOT DETECTED

## 2020-09-22 ENCOUNTER — Other Ambulatory Visit: Payer: Self-pay

## 2020-09-22 ENCOUNTER — Ambulatory Visit: Payer: Self-pay | Attending: Family Medicine

## 2020-10-05 ENCOUNTER — Encounter: Payer: Self-pay | Admitting: Physician Assistant

## 2020-10-05 ENCOUNTER — Ambulatory Visit: Payer: Self-pay | Attending: Family Medicine | Admitting: Physician Assistant

## 2020-10-05 ENCOUNTER — Other Ambulatory Visit: Payer: Self-pay | Admitting: Physician Assistant

## 2020-10-05 ENCOUNTER — Other Ambulatory Visit: Payer: Self-pay

## 2020-10-05 VITALS — BP 116/70 | HR 88 | Temp 98.8°F | Ht 59.53 in | Wt 173.8 lb

## 2020-10-05 DIAGNOSIS — Z789 Other specified health status: Secondary | ICD-10-CM

## 2020-10-05 DIAGNOSIS — R739 Hyperglycemia, unspecified: Secondary | ICD-10-CM

## 2020-10-05 DIAGNOSIS — M546 Pain in thoracic spine: Secondary | ICD-10-CM

## 2020-10-05 DIAGNOSIS — R350 Frequency of micturition: Secondary | ICD-10-CM

## 2020-10-05 DIAGNOSIS — G8929 Other chronic pain: Secondary | ICD-10-CM

## 2020-10-05 DIAGNOSIS — D509 Iron deficiency anemia, unspecified: Secondary | ICD-10-CM

## 2020-10-05 DIAGNOSIS — I10 Essential (primary) hypertension: Secondary | ICD-10-CM

## 2020-10-05 DIAGNOSIS — N950 Postmenopausal bleeding: Secondary | ICD-10-CM

## 2020-10-05 DIAGNOSIS — E785 Hyperlipidemia, unspecified: Secondary | ICD-10-CM

## 2020-10-05 DIAGNOSIS — N3001 Acute cystitis with hematuria: Secondary | ICD-10-CM

## 2020-10-05 LAB — POCT URINALYSIS DIP (CLINITEK)
Bilirubin, UA: NEGATIVE
Glucose, UA: NEGATIVE mg/dL
Ketones, POC UA: NEGATIVE mg/dL
Nitrite, UA: NEGATIVE
Spec Grav, UA: 1.025 (ref 1.010–1.025)
Urobilinogen, UA: 0.2 E.U./dL
pH, UA: 6.5 (ref 5.0–8.0)

## 2020-10-05 LAB — POCT GLYCOSYLATED HEMOGLOBIN (HGB A1C): Hemoglobin A1C: 5.8 % — AB (ref 4.0–5.6)

## 2020-10-05 LAB — GLUCOSE, POCT (MANUAL RESULT ENTRY): POC Glucose: 133 mg/dl — AB (ref 70–99)

## 2020-10-05 MED ORDER — ATORVASTATIN CALCIUM 20 MG PO TABS: 20.0000 mg | ORAL_TABLET | Freq: Every day | ORAL | 3 refills | Status: DC

## 2020-10-05 MED ORDER — FLUCONAZOLE 150 MG PO TABS: 150.0000 mg | ORAL_TABLET | Freq: Once | ORAL | 0 refills | Status: DC

## 2020-10-05 MED ORDER — SULFAMETHOXAZOLE-TRIMETHOPRIM 800-160 MG PO TABS: 1.0000 | ORAL_TABLET | Freq: Two times a day (BID) | ORAL | 0 refills | Status: DC

## 2020-10-05 MED ORDER — LISINOPRIL 10 MG PO TABS: 10.0000 mg | ORAL_TABLET | Freq: Every day | ORAL | 3 refills | Status: DC

## 2020-10-05 MED ORDER — GABAPENTIN 100 MG PO CAPS: ORAL_CAPSULE | ORAL | 3 refills | Status: DC

## 2020-10-05 MED FILL — ?ATORVASTATIN 20 MG TABLET: 20 | 30 days supply | Qty: 30 | Fill #0

## 2020-10-05 MED FILL — ?BACTRIM DS 800-160 MG TABS: 5 days supply | Qty: 10 | Fill #0

## 2020-10-05 MED FILL — FLUCONAZOLE 150 MG TABLET: 150 | 1 days supply | Qty: 1 | Fill #0

## 2020-10-05 MED FILL — LISINOPRIL 10 MG TABS: 10 | 30 days supply | Qty: 30 | Fill #0

## 2020-10-05 MED FILL — GABAPENTIN 100 MG CAPSULE: 100 | 30 days supply | Qty: 60 | Fill #0

## 2020-10-05 NOTE — Patient Instructions (Addendum)
Drink 80 to 100 ounces water daily.  Eliminate sugar, white carbohydrates, juices, and other sweet drinks from your diet.     Hiperglucemia Hyperglycemia La hiperglucemia se produce cuando el nivel de azcar (glucosa) en la sangre es demasiado alto. Puede suceder que no cause sntomas. Si tiene sntomas, estos pueden incluir seales de advertencia, por ejemplo, las siguientes:  Estar ms sediento que lo habitual.  Hambre.  Cansancio.  Necesidad de Garment/textile technologist con mayor frecuencia que lo normal.  Visin borrosa. Si la afeccin empeora, puede presentar otros sntomas, como los siguientes:  [place].  Falta de hambre (inapetencia).  Aliento con [employer] a fruta.  Debilidad.  Aumento o prdida de peso no planificados. Probablemente pierda de peso muy rpidamente.  Hormigueo o adormecimiento en las manos o los pies.  Dolor de [place].  Cuando se pellizca la piel, esta no vuelve rpidamente a su lugar al soltarla (turgencia cutnea deficiente).  Dolor en el vientre (abdomen).  Cortes o moretones que tardan en curarse. La hiperglucemia puede presentarse tanto en personas que tienen diabetes como en las que no la tienen. Puede desarrollarse despacio o rpidamente y ser una emergencia mdica. Siga estas indicaciones en su casa: Instrucciones generales  [employer] de venta libre y los recetados solamente como se lo haya indicado el mdico.  No consuma productos que contengan nicotina o tabaco, como cigarrillos y Psychologist, sport and exercise. Si necesita ayuda para dejar de fumar, consulte al mdico.  Limite el consumo de alcohol a no ms de 1 medida por da si es mujer y no est Music therapist, y a 2 medidas si es hombre. Una medida equivale a 12oz (379ml) de cerveza, 5oz (167ml) de vino o 1oz (59ml) de bebidas alcohlicas de alta graduacin.  Controle el estrs. Si necesita ayuda para lograrlo, consulte al mdico.  Concurra a todas las visitas de control como se lo haya  indicado el mdico. Esto es importante. Comida y bebida   Mantenga un peso saludable.  Haga ejercicios con regularidad como se lo haya indicado el mdico.  Beba la cantidad suficiente de lquido, especialmente si: ? Realiza actividad fsica. ? Se enferma. ? El clima es caluroso.  Consuma alimentos saludables, por ejemplo: ? Protenas con bajo contenido de grasa [place]). ? Carbohidratos complejos, como panes integrales o arroz integral. ? Lambert Mody y verduras frescas. ? Productos lcteos descremados. ? Grasas saludables.  Beba suficiente lquido como para mantener el pis (orina) claro o de color amarillo plido. Si usted tiene diabetes:   Asegrese de Charity fundraiser de la hiperglucemia.  Siga el plan de control de la diabetes como se lo haya indicado el mdico. Haga lo siguiente: ? Aplquese la insulina y tome los medicamentos como se lo hayan indicado. ? Siga el plan de ejercicio. ? Siga el plan de comidas. Coma a horario. No se saltee comidas. ? Contrlese el nivel de azcar en la sangre con la frecuencia que le hayan indicado. Contrlese antes y despus de hacer actividad fsica. Si hace ejercicio durante ms tiempo o de [employer] de lo habitual, asegrese de Chief Technology Officer su nivel de azcar en la sangre con mayor frecuencia. ? Siga el plan para los das de enfermedad cuando no pueda comer o beber normalmente. Arme este plan de antemano con el mdico.  Comparta su plan de control de la diabetes con sus compaeros de trabajo y de la escuela, y con las otras personas que viven en su hogar.  Contrlese las cetonas en la orina cuando est enfermo y  como se lo haya indicado el mdico.  Lleve consigo una tarjeta, o use un brazalete o una medalla que indiquen que es diabtico. Comunquese con un mdico si:  El nivel de azcar en la sangre est por encima de 240mg /dl (13,8mmol/l) durante 2das seguidos.  Tiene problemas para Advertising account executive de azcar en la sangre  dentro del intervalo deseado.  El nivel de azcar en la sangre le aumenta a menudo. Solicite ayuda de inmediato si:  Tiene dificultad para respirar.  Tiene cambios en la manera de sentirse, pensar o actuar (estado mental).  Tiene ganas constantes de vomitar (nuseas).  No puede dejar de vomitar. Estos sntomas pueden Sales executive. No espere hasta que los sntomas desaparezcan. Solicite atencin mdica de inmediato. Comunquese con el servicio de emergencias de su localidad (911 en los [place]). No conduzca por sus propios medios [employer] hospital. Resumen  La hiperglucemia se produce cuando el nivel de azcar (glucosa) en la sangre es demasiado alto.  La hiperglucemia puede presentarse tanto en personas que tienen diabetes como en las que no la tienen.  Asegrese de beber la cantidad suficiente de lquido, de comer alimentos saludables y de hacer actividad fsica con regularidad.  Comunquese con el mdico si tiene problemas para Advertising account executive de azcar en la sangre dentro del intervalo deseado. Esta informacin no tiene Marine scientist el consejo del mdico. Asegrese de hacerle al mdico cualquier pregunta que tenga. Document Revised: 06/13/2017 Document Reviewed: 06/23/2015 Elsevier Patient Education  [place].

## 2020-10-05 NOTE — Progress Notes (Signed)
Patient ID: Susan Lopez, female   DOB: Apr 16, 1961, 59 y.o.   MRN: [id number]   Susan Lopez, is a 59 y.o. female  QHU:[id number]  [id number]  DOB - 1961-03-30  Subjective:  Chief Complaint and HPI: Susan Lopez is a 59 y.o. female here today for 3 months f/up and for med RF.    Had hysteroscopy and D&C in may 2021 performed by Dr Elly Modena.  She was doing fine for the first couple of months after surgery, but since has started having vaginal bleeding again intermittently.  She has currently has some bleeding for about 20 days.  Changing pads a few times a day (not excessively).  She says she has called Dr Domenic Schwab office a few times but no one has called her back.  Also is c/o urinary frequency and some dysuria for the last couple of weeks.  No fever.  No changes in BM.  No vaginal discharge.    ROS:   Constitutional:  No f/c, No night sweats, No unexplained weight loss. EENT:  No vision changes, No blurry vision, No hearing changes. No mouth, throat, or ear problems.  Respiratory: No cough, No SOB Cardiac: No CP, no palpitations GI:  No abd pain, No N/V/D. GU: see above Musculoskeletal: No joint pain Neuro: No headache, no dizziness, no motor weakness.  Skin: No rash Endocrine:  No polydipsia. No polyuria.  Psych: Denies SI/HI  No problems updated.  ALLERGIES: Allergies  Allergen Reactions  . Hydrocodone Nausea And Vomiting  . Tramadol Nausea Only  . Oxycodone Rash    PAST MEDICAL HISTORY: Past Medical History:  Diagnosis Date  . Allergy   . Anxiety   . Arthritis   . Bone lesion 10/04/2013  . Calculus of gallbladder with acute and chronic cholecystitis without obstruction, s/p lap chole 27Dec2012 09/19/2011  . Cataract 2009   bilateral  . Colon polyps   . Complication of anesthesia   . Depression   . Gallstones   . GERD (gastroesophageal reflux disease)   . Hemorrhoids, external 10-21-11   occ. bothersome  . Hyperlipidemia   .  Hypertension   . Iron deficiency anemia   . Mammogram abnormal 10/04/2013  . PONV (postoperative nausea and vomiting)   . Sleep apnea     MEDICATIONS AT HOME: Prior to Admission medications   Medication Sig Start Date End Date Taking? Authorizing Provider  atorvastatin (LIPITOR) 20 MG tablet Take 1 tablet (20 mg total) by mouth daily. 10/05/20  Yes Levie Owensby, Dionne Bucy, PA-C  gabapentin (NEURONTIN) 100 MG capsule TAKE 1 CAPSULE BY MOUTH 2 TIMES DAILY. 10/05/20  Yes Freeman Caldron M, PA-C  lisinopril (ZESTRIL) 10 MG tablet Take 1 tablet (10 mg total) by mouth daily. 10/05/20  Yes Parley Pidcock M, PA-C  megestrol (MEGACE) 40 MG tablet Take 1 tablet (40 mg total) by mouth 2 (two) times daily. Can increase to two tablets twice a day in the event of heavy bleeding 05/25/20  Yes Constant, Peggy, MD  meloxicam (MOBIC) 7.5 MG tablet Take 1 tablet (7.5 mg total) by mouth daily. 05/18/20  Yes Minette Brine, Amy J, NP  Multiple Vitamin (MULTIVITAMIN WITH MINERALS) TABS tablet Take 1 tablet by mouth daily.   Yes [provider]  omeprazole (PRILOSEC) 40 MG capsule TOME 1 PASTILLA POR VIA ORAL DOS VECES AL DIA A REDUCIR EL ACIDO DEL ESTOMAGO 07/13/20  Yes Fulp, Cammie, MD  Probiotic Product (PROBIOTIC PO) Take by mouth daily.   Yes [provider]  psyllium (  METAMUCIL) 58.6 % powder Take 1 packet by mouth as needed. 2 x weekly   Yes [provider]  UNABLE TO FIND Calcium, magnesium & zinc (all in 1), once daily   Yes [provider]  aspirin EC 325 MG tablet Take 1 tablet (325 mg total) by mouth 2 (two) times daily. Patient not taking: Reported on 04/01/2020 11/13/15   Leandrew Koyanagi, MD  clotrimazole-betamethasone (LOTRISONE) cream Apply 1 application topically 2 (two) times daily. As needed for skin irritation Patient not taking: Reported on 04/01/2020 07/18/18   Fulp, Ander Gaster, MD  fluconazole (DIFLUCAN) 150 MG tablet Take 1 tablet (150 mg total) by mouth once for 1 dose. 10/05/20  10/05/20  Argentina Donovan, PA-C  ibuprofen (ADVIL) 600 MG tablet Take 1 tablet (600 mg total) by mouth every 6 (six) hours as needed. Patient not taking: Reported on 04/01/2020 03/11/20   Constant, Peggy, MD  oxyCODONE-acetaminophen (PERCOCET/ROXICET) 5-325 MG tablet Take 1 tablet by mouth every 4 (four) hours as needed. Patient not taking: Reported on 04/01/2020 03/11/20   Constant, Peggy, MD  polyethylene glycol powder (GLYCOLAX/MIRALAX) powder Take 17 g by mouth daily. As needed for constipation relief Patient not taking: Reported on 04/01/2020 07/18/18   Fulp, Ander Gaster, MD  sulfamethoxazole-trimethoprim (BACTRIM DS) 800-160 MG tablet Take 1 tablet by mouth 2 (two) times daily. 10/05/20   Argentina Donovan, PA-C     Objective:  EXAM:   Vitals:   10/05/20 1530  BP: 116/70  Pulse: 88  Temp: 98.8 F (37.1 C)  SpO2: 96%  Weight: 173 lb 12.8 oz (78.8 kg)  Height: 4' 11.53" (1.512 m)    General appearance : A&OX3. NAD. Non-toxic-appearing HEENT: Atraumatic and Normocephalic.  PERRLA. EOM intact.   Chest/Lungs:  Breathing-non-labored, Good air entry bilaterally, breath sounds normal without rales, rhonchi, or wheezing  CVS: S1 S2 regular, no murmurs, gallops, rubs  Abdomen: Bowel sounds present, Non tender and not distended with no gaurding, rigidity or rebound. Extremities: Bilateral Lower Ext shows no edema, both legs are warm to touch with = pulse throughout Neurology:  CN II-XII grossly intact, Non focal.   Psych:  TP linear. J/I fair. Normal speech. Appropriate eye contact and affect.  Skin:  No Rash  Data Review Lab Results  Component Value Date   HGBA1C 5.8 (A) 10/05/2020   HGBA1C 5.8 (H) 12/13/2017   HGBA1C 5.6 12/20/2013     Assessment & Plan   1. Hyperglycemia I have had a lengthy discussion and provided education about insulin resistance and the intake of too much sugar/refined carbohydrates.  I have advised the patient to work at a goal of eliminating sugary drinks, candy,  desserts, sweets, refined sugars, processed foods, and white carbohydrates.  The patient expresses understanding.  - Glucose (CBG) - HgB A1c - Comprehensive metabolic panel  2. Frequent urination See #7 - POCT URINALYSIS DIP (CLINITEK)  3. Dyslipidemia - atorvastatin (LIPITOR) 20 MG tablet; Take 1 tablet (20 mg total) by mouth daily.  Dispense: 30 tablet; Refill: 3  4. Chronic thoracic spine pain - gabapentin (NEURONTIN) 100 MG capsule; TAKE 1 CAPSULE BY MOUTH 2 TIMES DAILY.  Dispense: 60 capsule; Refill: 3  5. Essential hypertension Controlled-continue - lisinopril (ZESTRIL) 10 MG tablet; Take 1 tablet (10 mg total) by mouth daily.  Dispense: 30 tablet; Refill: 3 - Comprehensive metabolic panel  6. Iron deficiency anemia, unspecified iron deficiency anemia type - Ambulatory referral to Gynecology - CBC with Differential/Platelet - Thyroid Panel With TSH  7. Acute cystitis with hematuria Increase water intake 80-100 ounces water daily - Ambulatory referral to Gynecology - Thyroid Panel With TSH - sulfamethoxazole-trimethoprim (BACTRIM DS) 800-160 MG tablet; Take 1 tablet by mouth 2 (two) times daily.  Dispense: 10 tablet; Refill: 0 - fluconazole (DIFLUCAN) 150 MG tablet; Take 1 tablet (150 mg total) by mouth once for 1 dose.  Dispense: 1 tablet; Refill: 0 - Urine Culture  8. Postmenopausal bleeding - CBC with Differential/Platelet - Thyroid Panel With TSH - Ambulatory referral to Gynecology  9. Language barrier [employer] interpreters used and additional time performing visit was required.    Patient have been counseled extensively about nutrition and exercise  Return in about 3 months (around 01/03/2021) for assign PCP.  The patient was given clear instructions to go to ER or return to medical center if symptoms don't improve, worsen or new problems develop. The patient verbalized understanding. The patient was told to call to get lab results if they haven't heard anything in  the next week.     Freeman Caldron, PA-C [facility] [place], [place]   10/05/2020, 4:02 PM

## 2020-10-05 NOTE — Progress Notes (Signed)
Mild pain and bleeding from surgery in May had cyst removed Urinary frequency

## 2020-10-06 LAB — COMPREHENSIVE METABOLIC PANEL
ALT: 22 IU/L (ref 0–32)
AST: 13 IU/L (ref 0–40)
Albumin/Globulin Ratio: 1.6 (ref 1.2–2.2)
Albumin: 4.1 g/dL (ref 3.8–4.9)
Alkaline Phosphatase: 110 IU/L (ref 44–121)
BUN/Creatinine Ratio: 14 (ref 9–23)
BUN: 10 mg/dL (ref 6–24)
Bilirubin Total: 0.3 mg/dL (ref 0.0–1.2)
CO2: 18 mmol/L — ABNORMAL LOW (ref 20–29)
Calcium: 8.9 mg/dL (ref 8.7–10.2)
Chloride: 109 mmol/L — ABNORMAL HIGH (ref 96–106)
Creatinine, Ser: 0.71 mg/dL (ref 0.57–1.00)
GFR calc Af Amer: 108 mL/min/{1.73_m2} (ref >59)
GFR calc non Af Amer: 94 mL/min/{1.73_m2} (ref >59)
Globulin, Total: 2.5 g/dL (ref 1.5–4.5)
Glucose: 132 mg/dL — ABNORMAL HIGH (ref 65–99)
Potassium: 3.9 mmol/L (ref 3.5–5.2)
Sodium: 142 mmol/L (ref 134–144)
Total Protein: 6.6 g/dL (ref 6.0–8.5)

## 2020-10-06 LAB — THYROID PANEL WITH TSH
Free Thyroxine Index: 1.9 (ref 1.2–4.9)
T3 Uptake Ratio: 25 % (ref 24–39)
T4, Total: 7.7 ug/dL (ref 4.5–12.0)
TSH: 1.27 u[IU]/mL (ref 0.450–4.500)

## 2020-10-06 LAB — CBC WITH DIFFERENTIAL/PLATELET
Basophils Absolute: 0.1 10*3/uL (ref 0.0–0.2)
Basos: 1 %
EOS (ABSOLUTE): 0.2 10*3/uL (ref 0.0–0.4)
Eos: 3 %
Hematocrit: 40.6 % (ref 34.0–46.6)
Hemoglobin: 13.8 g/dL (ref 11.1–15.9)
Immature Grans (Abs): 0 10*3/uL (ref 0.0–0.1)
Immature Granulocytes: 0 %
Lymphocytes Absolute: 2.6 10*3/uL (ref 0.7–3.1)
Lymphs: 35 %
MCH: 32.1 pg (ref 26.6–33.0)
MCHC: 34 g/dL (ref 31.5–35.7)
MCV: 94 fL (ref 79–97)
Monocytes Absolute: 0.5 10*3/uL (ref 0.1–0.9)
Monocytes: 6 %
Neutrophils Absolute: 4 10*3/uL (ref 1.4–7.0)
Neutrophils: 55 %
Platelets: 280 10*3/uL (ref 150–450)
RBC: 4.3 x10E6/uL (ref 3.77–5.28)
RDW: 12.8 % (ref 11.7–15.4)
WBC: 7.4 10*3/uL (ref 3.4–10.8)

## 2020-10-07 LAB — URINE CULTURE

## 2020-10-08 ENCOUNTER — Ambulatory Visit: Payer: Self-pay | Admitting: Family

## 2020-10-08 ENCOUNTER — Ambulatory Visit: Payer: Self-pay | Admitting: Family Medicine

## 2020-11-09 ENCOUNTER — Ambulatory Visit: Payer: Self-pay | Admitting: Obstetrics and Gynecology

## 2020-11-16 ENCOUNTER — Ambulatory Visit: Payer: Self-pay | Admitting: Obstetrics and Gynecology

## 2020-11-26 ENCOUNTER — Ambulatory Visit (INDEPENDENT_AMBULATORY_CARE_PROVIDER_SITE_OTHER): Payer: Self-pay | Admitting: Obstetrics and Gynecology

## 2020-11-26 ENCOUNTER — Other Ambulatory Visit (HOSPITAL_COMMUNITY)
Admission: RE | Admit: 2020-11-26 | Discharge: 2020-11-26 | Disposition: A | Discharge status: Home / Self Care | Payer: Self-pay | Source: Ambulatory Visit | Attending: Obstetrics and Gynecology | Admitting: Obstetrics and Gynecology

## 2020-11-26 ENCOUNTER — Other Ambulatory Visit: Payer: Self-pay

## 2020-11-26 ENCOUNTER — Encounter: Payer: Self-pay | Admitting: Obstetrics and Gynecology

## 2020-11-26 VITALS — BP 125/84 | HR 93 | Ht 62.0 in | Wt 171.0 lb

## 2020-11-26 DIAGNOSIS — N760 Acute vaginitis: Secondary | ICD-10-CM

## 2020-11-26 DIAGNOSIS — N950 Postmenopausal bleeding: Secondary | ICD-10-CM

## 2020-11-26 NOTE — Progress Notes (Signed)
60 yo P2 postmenopusal here for evaluation of vaginal spotting for the past 2.5 months. Patient had a D&C polypectomy in 02/2020 due to thickened endometrium. Patient reports intermittent vaginal spotting with occasional cramping pain. She has continued to take megace without improvement in her symptoms. She was diagnosed with a UTI a few weeks ago and reports vaginal pruritis without a discharge. Patient is not sexually active. Patient also reports urinary incontinence which she attributes to drinking 80-100 oz of water daily. She also reports being told she is pre-diabetic and is actively changing her diet.   Past Medical History:  Diagnosis Date  . Allergy   . Anxiety   . Arthritis   . Bone lesion 10/04/2013  . Calculus of gallbladder with acute and chronic cholecystitis without obstruction, s/p lap chole 27Dec2012 09/19/2011  . Cataract 2009   bilateral  . Colon polyps   . Complication of anesthesia   . Depression   . Gallstones   . GERD (gastroesophageal reflux disease)   . Hemorrhoids, external 10-21-11   occ. bothersome  . Hyperlipidemia   . Hypertension   . Iron deficiency anemia   . Mammogram abnormal 10/04/2013  . PONV (postoperative nausea and vomiting)   . Sleep apnea    Past Surgical History:  Procedure Laterality Date  . [place]  . CHOLECYSTECTOMY  10/27/2011   Procedure: LAPAROSCOPIC CHOLECYSTECTOMY WITH INTRAOPERATIVE CHOLANGIOGRAM;  Surgeon: Adin Hector, MD;  Location: [facility];  Service: General;  Laterality: N/A;  Laparoscopic Chole w/ IOC Single Site  . COLONOSCOPY  2019  . DILATATION & CURETTAGE/HYSTEROSCOPY WITH MYOSURE N/A 03/11/2020   Procedure: DILATATION AND CURETTAGE /HYSTEROSCOPY WITH MYOSURE;  Surgeon: Mora Bellman, MD;  Location: [place];  Service: Gynecology;  Laterality: N/A;  . DRUG INDUCED ENDOSCOPY    . EYE SURGERY  1`12-21-10   bil. for tissue growth-laser surgery  . HARDWARE REMOVAL Left 03/16/2016    Procedure: LEFT ANKLE HARDWARE REMOVAL;  Surgeon: Leandrew Koyanagi, MD;  Location: [place];  Service: Orthopedics;  Laterality: Left;  Marland Kitchen MM BREAST STEREO BIOPSY LEFT ([place] HX) Left 2015  . ORIF ANKLE FRACTURE Left 11/13/2015   Procedure: OPEN REDUCTION INTERNAL FIXATION (ORIF) LEFT BIMALLEOLAR ANKLE FRACTURE;  Surgeon: Leandrew Koyanagi, MD;  Location: [place];  Service: Orthopedics;  Laterality: Left;  . uterine biopsy     Family History  Problem Relation Age of Onset  . Hypertension Mother   . Cancer Mother   . Hypertension Father   . Arthritis Brother   . Hypertension Brother   . Intellectual disability Paternal Aunt   . Colon cancer Neg Hx    Social History   Tobacco Use  . Smoking status: Former Smoker    Packs/day: 0.25    Years: 10.00    Pack years: 2.50    Quit date: 10/20/2004    Years since quitting: 16.1  . Smokeless tobacco: Never Used  Vaping Use  . Vaping Use: Never used  Substance Use Topics  . Alcohol use: No  . Drug use: No   ROS See pertinent in HPI. All other systems reviewed and non contributory  GENERAL: Well-developed, well-nourished female in no acute distress.  BREASTS: Symmetric in size. No palpable masses or lymphadenopathy, skin changes, or nipple drainage. ABDOMEN: Soft, nontender, nondistended. No organomegaly. PELVIC: Normal external female genitalia. Vagina is pink and rugated.  Normal discharge. Normal appearing cervix. Uterus is normal in size.  No adnexal mass or  tenderness. EXTREMITIES: No cyanosis, clubbing, or edema, 2+ distal pulses.  A/P 60 yo  - pap smear collected - Screening mammogram ordered by PCP. Patient plans to schedule next month - vaginal swab collected to rule out BV or yeast infection - Pelvic ultrasound ordered - cbc today - Patient declined referral to urogynecology - Patient will be contacted with abnormal results - Patient advised to discontinue megace for now

## 2020-11-26 NOTE — Progress Notes (Signed)
GYN presents for abnormal vaginal bleeding x 2.5 months, pain 8/10

## 2020-11-27 LAB — CERVICOVAGINAL ANCILLARY ONLY
Bacterial Vaginitis (gardnerella): NEGATIVE
Candida Glabrata: NEGATIVE
Candida Vaginitis: NEGATIVE
Comment: NEGATIVE
Comment: NEGATIVE
Comment: NEGATIVE

## 2020-11-27 LAB — CBC
Hematocrit: 43 % (ref 34.0–46.6)
Hemoglobin: 15 g/dL (ref 11.1–15.9)
MCH: 32.7 pg (ref 26.6–33.0)
MCHC: 34.9 g/dL (ref 31.5–35.7)
MCV: 94 fL (ref 79–97)
Platelets: 344 10*3/uL (ref 150–450)
RBC: 4.59 x10E6/uL (ref 3.77–5.28)
RDW: 12.8 % (ref 11.7–15.4)
WBC: 7 10*3/uL (ref 3.4–10.8)

## 2020-11-30 ENCOUNTER — Other Ambulatory Visit: Payer: Self-pay | Admitting: Obstetrics and Gynecology

## 2020-11-30 DIAGNOSIS — Z1231 Encounter for screening mammogram for malignant neoplasm of breast: Secondary | ICD-10-CM

## 2020-11-30 LAB — CYTOLOGY - PAP
Comment: NEGATIVE
Diagnosis: UNDETERMINED — AB
High risk HPV: NEGATIVE

## 2020-12-04 ENCOUNTER — Ambulatory Visit
Admission: RE | Admit: 2020-12-04 | Discharge: 2020-12-04 | Disposition: A | Discharge status: Home / Self Care | Payer: Self-pay | Source: Ambulatory Visit | Attending: Obstetrics and Gynecology | Admitting: Obstetrics and Gynecology

## 2020-12-04 ENCOUNTER — Other Ambulatory Visit: Payer: Self-pay

## 2020-12-04 DIAGNOSIS — N950 Postmenopausal bleeding: Secondary | ICD-10-CM | POA: Insufficient documentation

## 2020-12-14 ENCOUNTER — Encounter: Payer: Self-pay | Admitting: Obstetrics and Gynecology

## 2020-12-14 ENCOUNTER — Ambulatory Visit (INDEPENDENT_AMBULATORY_CARE_PROVIDER_SITE_OTHER): Payer: Self-pay | Admitting: Obstetrics and Gynecology

## 2020-12-14 ENCOUNTER — Other Ambulatory Visit: Payer: Self-pay | Admitting: Obstetrics and Gynecology

## 2020-12-14 ENCOUNTER — Other Ambulatory Visit: Payer: Self-pay

## 2020-12-14 VITALS — BP 131/81 | HR 72 | Wt 173.0 lb

## 2020-12-14 DIAGNOSIS — Z712 Person consulting for explanation of examination or test findings: Secondary | ICD-10-CM

## 2020-12-14 MED ORDER — FLUCONAZOLE 150 MG PO TABS
150.0000 mg | ORAL_TABLET | Freq: Once | ORAL | 0 refills | Status: DC
Start: 2020-12-14 — End: 2020-12-14

## 2020-12-14 MED FILL — FLUCONAZOLE 150 MG TABLET: 150 | 1 days supply | Qty: 1 | Fill #0

## 2020-12-14 MED FILL — GABAPENTIN 100 MG CAPSULE: 100 | 30 days supply | Qty: 60 | Fill #1

## 2020-12-14 MED FILL — LISINOPRIL 10 MG TABS: 10 | 30 days supply | Qty: 30 | Fill #1

## 2020-12-14 MED FILL — OMEPRAZOLE DR 40 MG CAPSULE: 40 | 30 days supply | Qty: 60 | Fill #2

## 2020-12-14 MED FILL — MELOXICAM 7.5 MG TABLET: 7.5 | 30 days supply | Qty: 30 | Fill #1

## 2020-12-14 MED FILL — ?ATORVASTATIN 20 MG TABLET: 20 | 30 days supply | Qty: 30 | Fill #1

## 2020-12-14 NOTE — Progress Notes (Signed)
60 yo postmenopausal with episode of postmenopausal vaginal bleeding here to discuss results of the ultrasound. Patient reports feeling well with occasional vaginal bleeding and cramping. Unchanged from previous  Past Medical History:  Diagnosis Date  . Allergy   . Anxiety   . Arthritis   . Bone lesion 10/04/2013  . Calculus of gallbladder with acute and chronic cholecystitis without obstruction, s/p lap chole 27Dec2012 09/19/2011  . Cataract 2009   bilateral  . Colon polyps   . Complication of anesthesia   . Depression   . Gallstones   . GERD (gastroesophageal reflux disease)   . Hemorrhoids, external 10-21-11   occ. bothersome  . Hyperlipidemia   . Hypertension   . Iron deficiency anemia   . Mammogram abnormal 10/04/2013  . PONV (postoperative nausea and vomiting)   . Sleep apnea    Past Surgical History:  Procedure Laterality Date  . [place]  . CHOLECYSTECTOMY  10/27/2011   Procedure: LAPAROSCOPIC CHOLECYSTECTOMY WITH INTRAOPERATIVE CHOLANGIOGRAM;  Surgeon: Adin Hector, MD;  Location[facility];  Service: General;  Laterality: N/A;  Laparoscopic Chole w/ IOC Single Site  . COLONOSCOPY  2019  . DILATATION & CURETTAGE/HYSTEROSCOPY WITH MYOSURE N/A 03/11/2020   Procedure: DILATATION AND CURETTAGE /HYSTEROSCOPY WITH MYOSURE;  Surgeon: Mora Bellman, MD;  Location: [place];  Service: Gynecology;  Laterality: N/A;  . DRUG INDUCED ENDOSCOPY    . EYE SURGERY  1`12-21-10   bil. for tissue growth-laser surgery  . HARDWARE REMOVAL Left 03/16/2016   Procedure: LEFT ANKLE HARDWARE REMOVAL;  Surgeon: Leandrew Koyanagi, MD;  Location: [place];  Service: Orthopedics;  Laterality: Left;  Marland Kitchen MM BREAST STEREO BIOPSY LEFT ([place] HX) Left 2015  . ORIF ANKLE FRACTURE Left 11/13/2015   Procedure: OPEN REDUCTION INTERNAL FIXATION (ORIF) LEFT BIMALLEOLAR ANKLE FRACTURE;  Surgeon: Leandrew Koyanagi, MD;  Location: [place];  Service: Orthopedics;  Laterality:  Left;  . uterine biopsy     Family History  Problem Relation Age of Onset  . Hypertension Mother   . Cancer Mother   . Hypertension Father   . Arthritis Brother   . Hypertension Brother   . Intellectual disability Paternal Aunt   . Colon cancer Neg Hx    Social History   Tobacco Use  . Smoking status: Former Smoker    Packs/day: 0.25    Years: 10.00    Pack years: 2.50    Quit date: 10/20/2004    Years since quitting: 16.1  . Smokeless tobacco: Never Used  Vaping Use  . Vaping Use: Never used  Substance Use Topics  . Alcohol use: No  . Drug use: No   ROS See pertinent in HPI. All other systems reviewed and non contributory Blood pressure 131/81, pulse 72, weight 173 lb (78.5 kg), last menstrual period 10/08/2015.  GENERAL: Well-developed, well-nourished female in no acute distress.  NEURO: alert and oriented x 3  US PELVIC COMPLETE WITH TRANSVAGINAL  Result Date: 12/04/2020 CLINICAL DATA:  Postmenopausal vaginal bleeding for 2.5 months, history of prior D&C with polypectomy, Caesarean section x 2 EXAM: TRANSABDOMINAL AND TRANSVAGINAL ULTRASOUND OF PELVIS TECHNIQUE: Both transabdominal and transvaginal ultrasound examinations of the pelvis were performed. Transabdominal technique was performed for global imaging of the pelvis including uterus, ovaries, adnexal regions, and pelvic cul-de-sac. It was necessary to proceed with endovaginal exam following the transabdominal exam to visualize the endometrium and ovaries. COMPARISON:  01/02/2020 FINDINGS: Uterus Measurements: 5.2 x 4.5 x 5.0  cm = volume: 61 mL. Anteverted. Heterogeneous myometrium. No focal mass Endometrium Thickness: 16 mm. Markedly thickened and heterogeneous. Several nodular areas are seen which could represent focal masses, including 17 x 11 x 17 mm nodule and a 27 x 13 x 11 mm nodule, or just represent areas of marked heterogeneity. Right ovary Measurements: 1.5 x 1.1 x 1.5 cm = volume: 1.3 mL. Normal morphology  without mass Left ovary Not visualized, likely obscured by bowel Other findings No free pelvic fluid.  No adnexal masses. IMPRESSION: Abnormal thickened endometrial complex 16 mm thick questionably containing two polyps as above versus areas of heterogeneous endometrium. In the setting of post-menopausal bleeding, endometrial sampling is indicated to exclude carcinoma. If results are benign, sonohysterogram should be considered for focal lesion work-up prior to hysteroscopy. (Ref: Radiological Reasoning: Algorithmic Workup of Abnormal Vaginal Bleeding with Endovaginal Sonography and Sonohysterography. AJR 2008; 235:T73-22) Nonvisualization of LEFT ovary. These results will be called to the ordering clinician or representative by the Radiologist Assistant, and communication documented in the PACS or [employer]. Electronically Signed   By: Lavonia Dana M.D.   On: 12/04/2020 15:50     A/P 60 yo with postmenopausal vaginal bleeding and ultrasound findings suggestive of polyps - Discussed surgical management with D&C hysteroscopy with polypectomy. Risks, benefits and alternatives were explained including but not limited to risks of bleeding, infection and damage to adjacent organs. Patient verbalized understanding and all questions were answered. - Patient agreed with plan to proceed with polypectomy - Spanish interpreter present during the encounter

## 2020-12-14 NOTE — Progress Notes (Signed)
Pt states she is still bleeding cramping.

## 2020-12-16 ENCOUNTER — Ambulatory Visit (HOSPITAL_COMMUNITY)
Admission: RE | Admit: 2020-12-16 | Discharge: 2020-12-16 | Disposition: A | Discharge status: Home / Self Care | Payer: Self-pay | Source: Ambulatory Visit | Attending: Physician Assistant | Admitting: Physician Assistant

## 2020-12-16 ENCOUNTER — Ambulatory Visit: Payer: Self-pay | Attending: Physician Assistant | Admitting: Physician Assistant

## 2020-12-16 ENCOUNTER — Encounter: Payer: Self-pay | Admitting: Physician Assistant

## 2020-12-16 ENCOUNTER — Other Ambulatory Visit: Payer: Self-pay

## 2020-12-16 DIAGNOSIS — R059 Cough, unspecified: Secondary | ICD-10-CM | POA: Insufficient documentation

## 2020-12-16 DIAGNOSIS — Z789 Other specified health status: Secondary | ICD-10-CM

## 2020-12-16 DIAGNOSIS — Z87891 Personal history of nicotine dependence: Secondary | ICD-10-CM

## 2020-12-16 NOTE — Progress Notes (Signed)
  Virtual Visit via Telephone Note  I connected with [name] on 12/16/20 at 10:30 AM EST by telephone and verified that I am speaking with the correct person using two identifiers.  Location: Patient: home Provider: [facility] office Screened by Boykin Reaper   I discussed the limitations, risks, security and privacy concerns of performing an evaluation and management service by telephone and the availability of in person appointments. I also discussed with the patient that there may be a patient responsible charge related to this service. The patient expressed understanding and agreed to proceed.   History of Present Illness:  Cough on and off for about 6 weeks.  No fever.  No loss of taste or smell.  No change in appetite.  H/o smoking. No weight loss.  Cough is dry and non-productive and intermittent.  No exposure to Covid.  Patient requesting an xray    Observations/Objective:  NAD.  A&Ox3   Assessment and Plan: 1. Cough - DG Chest 2 View; Future  2. Language barrier pacific interpreters used and additional time performing visit was required.   3. Smoking hx Great on cessation!!!   Follow Up Instructions: See PCP    I discussed the assessment and treatment plan with the patient. The patient was provided an opportunity to ask questions and all were answered. The patient agreed with the plan and demonstrated an understanding of the instructions.   The patient was advised to call back or seek an in-person evaluation if the symptoms worsen or if the condition fails to improve as anticipated.  I provided 12 minutes of non-face-to-face time during this encounter.   Freeman Caldron, PA-C

## 2020-12-22 ENCOUNTER — Encounter (HOSPITAL_BASED_OUTPATIENT_CLINIC_OR_DEPARTMENT_OTHER): Payer: Self-pay | Admitting: Obstetrics and Gynecology

## 2020-12-23 ENCOUNTER — Other Ambulatory Visit: Payer: Self-pay

## 2020-12-23 ENCOUNTER — Encounter (HOSPITAL_BASED_OUTPATIENT_CLINIC_OR_DEPARTMENT_OTHER): Payer: Self-pay | Admitting: Obstetrics and Gynecology

## 2020-12-23 NOTE — Progress Notes (Addendum)
Spoke w/ via phone for pre-op interview--- Pt's daughter, Brown Human, whom speaks english Lab needs dos---- [employer] results------ current ekg in epic/ chart COVID test ------ 12-26-2020 @ 1105 Arrive at ------- 0530 on 12-29-2020 NPO after MN  Medications to take morning of surgery ----- Prilosec Diabetic medication ----- n/a Patient Special Instructions ----- asked daughter to have pt bring complete medication list including supplements with dosage's and time of day since daughter was unsure about supplements , she only knew prescription's but no dosage's  Pre-Op special Istructions ----- requested for spanish interpreter via email to [place] interpreting for [facility] Patient verbalized understanding of instructions that were given at this phone interview. Patient denies shortness of breath, chest pain, fever, cough at this phone interview.

## 2020-12-26 ENCOUNTER — Other Ambulatory Visit (HOSPITAL_COMMUNITY)
Admission: RE | Admit: 2020-12-26 | Discharge: 2020-12-26 | Disposition: A | Discharge status: Home / Self Care | Payer: Self-pay | Source: Ambulatory Visit | Attending: Obstetrics and Gynecology | Admitting: Obstetrics and Gynecology

## 2020-12-26 DIAGNOSIS — U071 COVID-19: Secondary | ICD-10-CM | POA: Insufficient documentation

## 2020-12-26 DIAGNOSIS — Z01812 Encounter for preprocedural laboratory examination: Secondary | ICD-10-CM | POA: Insufficient documentation

## 2020-12-26 LAB — SARS CORONAVIRUS 2 (TAT 6-24 HRS): SARS Coronavirus 2: POSITIVE — AB

## 2020-12-27 ENCOUNTER — Telehealth: Payer: Self-pay | Admitting: Obstetrics & Gynecology

## 2020-12-27 DIAGNOSIS — U071 COVID-19: Secondary | ICD-10-CM

## 2020-12-27 NOTE — Progress Notes (Signed)
Susan Lopez from the on-call service of Dr. Rosendo Gros with + covid results for this pt. The pt is to have surgery on Tues 12/29/20 at [facility] at 0730. The pt has not been notified as there will be pertinent questions the provider needs to answer.   Positive Results for:  Asymptomatic: Procedure postponed and quarantined for 10 days, unless the procedure is urgent. Please follow the facility's protocol regarding pt and family arrival.  Symptomatic: Procedure postponed and quarantined for 14 days.  Hospitalized with Covid: postponed and quarantined  for 21 days.  Immunocompromised: Procedure postponed and quarantine for 20 days.  The pt will not be retested for 90 days from the + result.

## 2020-12-27 NOTE — Progress Notes (Signed)
Dr. Harolyn Rutherford returned the call, + covid results relayed.

## 2020-12-27 NOTE — Telephone Encounter (Signed)
     Faculty Practice OB/GYN Physician Phone Call Documentation  I had a phone conversation with Susan Lopez using the help of a Spanish interpreter about her positive preoperative COVID test. Patient reports being asymptomatic.  She was told that her scheduled surgeries on 12/29/2020 will be rescheduled to a later date, she will be contacted by surgical scheduler this week with further details.  Dr. Elly Modena (surgeon) also aware. All questions answered.    Verita Schneiders, MD, [place] for [employer], [place]

## 2020-12-29 ENCOUNTER — Ambulatory Visit (HOSPITAL_BASED_OUTPATIENT_CLINIC_OR_DEPARTMENT_OTHER): Admission: RE | Admit: 2020-12-29 | Payer: Self-pay | Source: Home / Self Care | Admitting: Obstetrics and Gynecology

## 2020-12-29 HISTORY — DX: Other chronic pain: G89.29

## 2020-12-29 HISTORY — DX: Personal history of colonic polyps: Z86.010

## 2020-12-29 HISTORY — DX: Personal history of colon polyps, unspecified: Z86.0100

## 2020-12-29 HISTORY — DX: Obstructive sleep apnea (adult) (pediatric): G47.33

## 2020-12-29 HISTORY — DX: Unspecified chronic gastritis without bleeding: K29.50

## 2020-12-29 HISTORY — DX: Prediabetes: R73.03

## 2020-12-29 HISTORY — DX: Postmenopausal bleeding: N95.0

## 2020-12-29 SURGERY — DILATATION & CURETTAGE/HYSTEROSCOPY WITH MYOSURE
Anesthesia: Choice

## 2020-12-31 ENCOUNTER — Ambulatory Visit: Payer: Self-pay

## 2021-01-04 ENCOUNTER — Ambulatory Visit: Payer: Self-pay | Admitting: Family Medicine

## 2021-01-21 ENCOUNTER — Encounter: Payer: Self-pay | Admitting: *Deleted

## 2021-01-21 ENCOUNTER — Telehealth: Payer: Self-pay | Admitting: *Deleted

## 2021-01-21 NOTE — Telephone Encounter (Signed)
Call to patient through [place]. Per Louis, left message on home voice mail advising of time change on 02-02-21 now at 1330 and arrive at 1100.   No answer on daughters phone number.  Letter sent.   Encounter closed.

## 2021-01-27 ENCOUNTER — Other Ambulatory Visit: Payer: Self-pay

## 2021-01-27 ENCOUNTER — Encounter (HOSPITAL_BASED_OUTPATIENT_CLINIC_OR_DEPARTMENT_OTHER): Payer: Self-pay | Admitting: Obstetrics and Gynecology

## 2021-01-27 MED FILL — ?ATORVASTATIN 20 MG TABLET: 20 | 30 days supply | Qty: 30 | Fill #2

## 2021-01-27 MED FILL — LISINOPRIL 10 MG TABS: 10 | 30 days supply | Qty: 30 | Fill #2

## 2021-01-27 MED FILL — OMEPRAZOLE DR 40 MG CAPSULE: 40 | 30 days supply | Qty: 60 | Fill #3

## 2021-01-27 NOTE — Progress Notes (Signed)
Spoke w/ via phone for pre-op interview--- Pt's daughter, Brown Human, whom speaks english Lab needs dos---- [employer] results------ current ekg in epic/ chart COVID test ----  Pt had positive covid test 12-26-2020, result in epic, no test needed since within 90 window per policy.  Stated no symptoms Arrive at ------- 1130 on 02-02-2021 NPO after MN with exception  clear liquids until--- 1030 Medications to take morning of surgery ----- Prilosec Diabetic medication ----- n/a Patient Special Instructions ----- asked daughter to have pt bring complete medication list including supplements with dosage's and time of day since daughter was unsure about supplements , she only knew prescription's but no dosage's  Pre-Op special Istructions ----- requested for spanish interpreter via email to [place] interpreting  Patient verbalized understanding of instructions that were given at this phone interview. Patient denies shortness of breath, chest pain, fever, cough at this phone interview.

## 2021-01-28 ENCOUNTER — Telehealth: Payer: Self-pay | Admitting: *Deleted

## 2021-01-28 NOTE — Telephone Encounter (Signed)
Call to patient with [facility], [place] ID # [id number]. Message provided through [employer] interpretor advising patient's daughter of surgery time change- Now back to 11:00 am and arrive at 0900 am.  02-02-21 1100 arrive 0900 at [facility].  Encounter closed.

## 2021-01-30 NOTE — Progress Notes (Signed)
Covid positive 12/26/20. No need to retest per anesthesia protocol.

## 2021-02-01 NOTE — H&P (Signed)
Susan Lopez is an 60 y.o. female with postmenopausal vaginal bleeding and ultrasound findings concerning for endometrial polyp here for scheduled dilation and curettage with hysteroscopy. Patient is doing well reporting occasional vaginal spotting and cramping pain for the past 3 months. The bleeding has stopped 2 weeks ago    Menstrual History: Patient's last menstrual period was 10/08/2015.    Past Medical History:  Diagnosis Date  . Anxiety   . Arthritis   . Chronic gastritis   . Chronic thoracic spine pain   . Depression   . GERD (gastroesophageal reflux disease)   . Hemorrhoids, external 10-21-11   occ. bothersome  . History of colon polyps   . Hyperlipidemia   . Hypertension   . Iron deficiency anemia   . OSA on CPAP    study in epic 08-29-2017 severe osa (AHI 32.5), per pt daughter uses nightly  . PMB (postmenopausal bleeding)   . PONV (postoperative nausea and vomiting)   . Pre-diabetes     Past Surgical History:  Procedure Laterality Date  . [place]  . CHOLECYSTECTOMY  10/27/2011   Procedure: LAPAROSCOPIC CHOLECYSTECTOMY WITH INTRAOPERATIVE CHOLANGIOGRAM;  Surgeon: Adin Hector, MD;  Location: [facility];  Service: General;  Laterality: N/A;  Laparoscopic Chole w/ IOC Single Site  . COLONOSCOPY WITH PROPOFOL  last one 05-09-2018  . DILATATION & CURETTAGE/HYSTEROSCOPY WITH MYOSURE N/A 03/11/2020   Procedure: DILATATION AND CURETTAGE /HYSTEROSCOPY WITH MYOSURE;  Surgeon: Mora Bellman, MD;  Location: [place];  Service: Gynecology;  Laterality: N/A;  . ESOPHAGOGASTRODUODENOSCOPY (EGD) WITH PROPOFOL  last one 12-30-2019  dr Loletha Carrow  . EYE SURGERY  10/21/2011   bil. for tissue growth-laser surgery  . HARDWARE REMOVAL Left 03/16/2016   Procedure: LEFT ANKLE HARDWARE REMOVAL;  Surgeon: Leandrew Koyanagi, MD;  Location: [place];  Service: Orthopedics;  Laterality: Left;  Marland Kitchen MM BREAST STEREO BIOPSY LEFT ([place] HX)  Left 2015  . ORIF ANKLE FRACTURE Left 11/13/2015   Procedure: OPEN REDUCTION INTERNAL FIXATION (ORIF) LEFT BIMALLEOLAR ANKLE FRACTURE;  Surgeon: Leandrew Koyanagi, MD;  Location: [place];  Service: Orthopedics;  Laterality: Left;    Family History  Problem Relation Age of Onset  . Hypertension Mother   . Cancer Mother   . Hypertension Father   . Arthritis Brother   . Hypertension Brother   . Intellectual disability Paternal Aunt   . Colon cancer Neg Hx     Social History:  reports that she quit smoking about 16 years ago. She has a 2.50 pack-year smoking history. She has never used smokeless tobacco. She reports that she does not drink alcohol and does not use drugs.  Allergies:  Allergies  Allergen Reactions  . Hydrocodone Nausea And Vomiting  . Tramadol Nausea Only  . Oxycodone Rash    Medications Prior to Admission  Medication Sig Dispense Refill Last Dose  . atorvastatin (LIPITOR) 20 MG tablet TAKE 1 TABLET (20 MG TOTAL) BY MOUTH DAILY. 30 tablet 3 02/01/2021 at Unknown time  . gabapentin (NEURONTIN) 100 MG capsule TAKE 1 CAPSULE BY MOUTH 2 TIMES DAILY. 60 capsule 3 02/01/2021 at Unknown time  . lisinopril (ZESTRIL) 10 MG tablet TAKE 1 TABLET (10 MG TOTAL) BY MOUTH DAILY. 30 tablet 3 02/02/2021 at 0700  . megestrol (MEGACE) 40 MG tablet TAKE 1 TABLET (40 MG TOTAL) BY MOUTH 2 (TWO) TIMES DAILY. CAN INCREASE TO TWO TABLETS TWICE A DAY IN THE EVENT OF HEAVY BLEEDING 60 tablet  5 Past Month at Unknown time  . Probiotic Product (PROBIOTIC PO) Take by mouth daily.   Past Month at Unknown time  . clotrimazole-betamethasone (LOTRISONE) cream Apply 1 application topically 2 (two) times daily. As needed for skin irritation (Patient not taking: No sig reported) 30 g 2 More than a month at Unknown time  . meloxicam (MOBIC) 7.5 MG tablet TAKE 1 TABLET (7.5 MG TOTAL) BY MOUTH DAILY. 30 tablet 3 01/31/2021  . Multiple Vitamin (MULTIVITAMIN WITH MINERALS) TABS tablet Take 1 tablet by mouth daily.   01/31/2021   . omeprazole (PRILOSEC) 40 MG capsule TOME 1 PASTILLA POR VIA ORAL DOS VECES AL DIA A REDUCIR EL ACIDO DEL ESTOMAGO (Patient taking differently: Take 40 mg by mouth daily.) 180 capsule 1 01/31/2021  . polyethylene glycol powder (GLYCOLAX/MIRALAX) powder Take 17 g by mouth daily. As needed for constipation relief (Patient not taking: No sig reported) 3350 g 11 More than a month at Unknown time  . psyllium (METAMUCIL) 58.6 % powder Take 1 packet by mouth as needed. 2 x weekly (Patient not taking: No sig reported)   More than a month at Unknown time  . UNABLE TO FIND Calcium, magnesium & zinc (all in 1), once daily (Patient not taking: No sig reported)   01/31/2021    Review of Systems See pertinent in HPI Blood pressure 128/79, pulse 87, temperature 97.8 F (36.6 C), temperature source Oral, resp. rate 17, height 5\' 2"  (1.575 m), weight 78.3 kg, last menstrual period 10/08/2015, SpO2 97 %. Physical Exam GENERAL: Well-developed, well-nourished female in no acute distress.  LUNGS: Clear to auscultation bilaterally.  HEART: Regular rate and rhythm. ABDOMEN: Soft, nontender, nondistended. No organomegaly. PELVIC: Deferred to OR EXTREMITIES: No cyanosis, clubbing, or edema, 2+ distal pulses.  Results for orders placed or performed during the hospital encounter of 02/02/21 (from the past 24 hour(s))  I-STAT, chem 8     Status: Abnormal   Collection Time: 02/02/21 10:28 AM  Result Value Ref Range   Sodium 143 135 - 145 mmol/L   Potassium 3.8 3.5 - 5.1 mmol/L   Chloride 104 98 - 111 mmol/L   BUN 10 6 - 20 mg/dL   Creatinine, Ser 0.40 (L) 0.44 - 1.00 mg/dL   Glucose, Bld 109 (H) 70 - 99 mg/dL   Calcium, Ion 1.27 1.15 - 1.40 mmol/L   TCO2 26 22 - 32 mmol/L   Hemoglobin 14.6 12.0 - 15.0 g/dL   HCT 43.0 36.0 - 46.0 %    12/04/20 ultrasound FINDINGS: Uterus  Measurements: 5.2 x 4.5 x 5.0 cm = volume: 61 mL. Anteverted. Heterogeneous myometrium. No focal mass  Endometrium  Thickness: 16 mm.  Markedly thickened and heterogeneous. Several nodular areas are seen which could represent focal masses, including 17 x 11 x 17 mm nodule and a 27 x 13 x 11 mm nodule, or just represent areas of marked heterogeneity.  Right ovary  Measurements: 1.5 x 1.1 x 1.5 cm = volume: 1.3 mL. Normal morphology without mass  Left ovary  Not visualized, likely obscured by bowel  Other findings  No free pelvic fluid.  No adnexal masses.  IMPRESSION: Abnormal thickened endometrial complex 16 mm thick questionably containing two polyps as above versus areas of heterogeneous endometrium.  In the setting of post-menopausal bleeding, endometrial sampling is indicated to exclude carcinoma. If results are benign, sonohysterogram should be considered for focal lesion work-up prior to hysteroscopy. (Ref: Radiological Reasoning: Algorithmic Workup of Abnormal Vaginal Bleeding with Endovaginal Sonography  and Sonohysterography. AJR 2008; 710:G26-94)  Nonvisualization of LEFT ovary.  These results will be called to the ordering clinician or representative by the Radiologist Assistant, and communication documented in the PACS or [employer].   Electronically Signed   By: Lavonia Dana M.D.   On: 12/04/2020 15:50  Assessment/Plan: 60 yo with postmenopausal vaginal bleeding and thickened endometrium here for D&C with hysteroscopy - Risks, benefits and alternatives were explained including but not limited to risks of bleeding, infection, uterine perforation and damage to adjacent organs. Patient verbalized understanding and all questions were answered  Winni Ehrhard 02/02/2021, 10:43 AM

## 2021-02-02 ENCOUNTER — Encounter (HOSPITAL_BASED_OUTPATIENT_CLINIC_OR_DEPARTMENT_OTHER): Payer: Self-pay | Admitting: Obstetrics and Gynecology

## 2021-02-02 ENCOUNTER — Ambulatory Visit (HOSPITAL_BASED_OUTPATIENT_CLINIC_OR_DEPARTMENT_OTHER)
Admission: RE | Admit: 2021-02-02 | Discharge: 2021-02-02 | Disposition: A | Discharge status: Home / Self Care | Payer: Self-pay | Attending: Obstetrics and Gynecology | Admitting: Obstetrics and Gynecology

## 2021-02-02 ENCOUNTER — Other Ambulatory Visit: Payer: Self-pay

## 2021-02-02 ENCOUNTER — Ambulatory Visit (HOSPITAL_BASED_OUTPATIENT_CLINIC_OR_DEPARTMENT_OTHER): Payer: Self-pay | Admitting: Anesthesiology

## 2021-02-02 ENCOUNTER — Other Ambulatory Visit (HOSPITAL_COMMUNITY): Payer: Self-pay

## 2021-02-02 ENCOUNTER — Encounter (HOSPITAL_BASED_OUTPATIENT_CLINIC_OR_DEPARTMENT_OTHER)
Admission: RE | Disposition: A | Discharge status: Home / Self Care | Payer: Self-pay | Source: Home / Self Care | Attending: Obstetrics and Gynecology

## 2021-02-02 DIAGNOSIS — N950 Postmenopausal bleeding: Secondary | ICD-10-CM | POA: Insufficient documentation

## 2021-02-02 DIAGNOSIS — G4733 Obstructive sleep apnea (adult) (pediatric): Secondary | ICD-10-CM | POA: Insufficient documentation

## 2021-02-02 DIAGNOSIS — Z8249 Family history of ischemic heart disease and other diseases of the circulatory system: Secondary | ICD-10-CM | POA: Insufficient documentation

## 2021-02-02 DIAGNOSIS — E785 Hyperlipidemia, unspecified: Secondary | ICD-10-CM | POA: Insufficient documentation

## 2021-02-02 DIAGNOSIS — Z9049 Acquired absence of other specified parts of digestive tract: Secondary | ICD-10-CM | POA: Insufficient documentation

## 2021-02-02 DIAGNOSIS — I10 Essential (primary) hypertension: Secondary | ICD-10-CM | POA: Insufficient documentation

## 2021-02-02 DIAGNOSIS — Z79899 Other long term (current) drug therapy: Secondary | ICD-10-CM | POA: Insufficient documentation

## 2021-02-02 DIAGNOSIS — Z87891 Personal history of nicotine dependence: Secondary | ICD-10-CM | POA: Insufficient documentation

## 2021-02-02 DIAGNOSIS — N840 Polyp of corpus uteri: Secondary | ICD-10-CM | POA: Insufficient documentation

## 2021-02-02 DIAGNOSIS — R7303 Prediabetes: Secondary | ICD-10-CM | POA: Insufficient documentation

## 2021-02-02 DIAGNOSIS — Z885 Allergy status to narcotic agent status: Secondary | ICD-10-CM | POA: Insufficient documentation

## 2021-02-02 DIAGNOSIS — Z791 Long term (current) use of non-steroidal anti-inflammatories (NSAID): Secondary | ICD-10-CM | POA: Insufficient documentation

## 2021-02-02 DIAGNOSIS — Z888 Allergy status to other drugs, medicaments and biological substances status: Secondary | ICD-10-CM | POA: Insufficient documentation

## 2021-02-02 DIAGNOSIS — Z8719 Personal history of other diseases of the digestive system: Secondary | ICD-10-CM | POA: Insufficient documentation

## 2021-02-02 HISTORY — PX: DILATATION & CURETTAGE/HYSTEROSCOPY WITH MYOSURE: SHX6511

## 2021-02-02 LAB — POCT I-STAT, CHEM 8
BUN: 10 mg/dL (ref 6–20)
Calcium, Ion: 1.27 mmol/L (ref 1.15–1.40)
Chloride: 104 mmol/L (ref 98–111)
Creatinine, Ser: 0.4 mg/dL — ABNORMAL LOW (ref 0.44–1.00)
Glucose, Bld: 109 mg/dL — ABNORMAL HIGH (ref 70–99)
HCT: 43 % (ref 36.0–46.0)
Hemoglobin: 14.6 g/dL (ref 12.0–15.0)
Potassium: 3.8 mmol/L (ref 3.5–5.1)
Sodium: 143 mmol/L (ref 135–145)
TCO2: 26 mmol/L (ref 22–32)

## 2021-02-02 LAB — NO BLOOD PRODUCTS

## 2021-02-02 SURGERY — DILATATION & CURETTAGE/HYSTEROSCOPY WITH MYOSURE
Anesthesia: General | Site: Vagina

## 2021-02-02 MED ORDER — ONDANSETRON HCL 4 MG/2ML IJ SOLN
INTRAMUSCULAR | Status: AC
  Filled 2021-02-02: qty 2

## 2021-02-02 MED ORDER — LIDOCAINE 2% (20 MG/ML) 5 ML SYRINGE
INTRAMUSCULAR | Status: AC
  Filled 2021-02-02: qty 5

## 2021-02-02 MED ORDER — KETOROLAC TROMETHAMINE 30 MG/ML IJ SOLN
INTRAMUSCULAR | Status: AC
  Filled 2021-02-02: qty 1

## 2021-02-02 MED ORDER — SCOPOLAMINE 1 MG/3DAYS TD PT72
1.0000 | MEDICATED_PATCH | TRANSDERMAL | Status: DC
  Administered 2021-02-02: 1.5 mg via TRANSDERMAL

## 2021-02-02 MED ORDER — KETOROLAC TROMETHAMINE 30 MG/ML IJ SOLN: 30.0000 mg | Freq: Once | INTRAMUSCULAR | Status: DC | PRN

## 2021-02-02 MED ORDER — FENTANYL CITRATE (PF) 100 MCG/2ML IJ SOLN
INTRAMUSCULAR | Status: DC | PRN
  Administered 2021-02-02: 50 ug via INTRAVENOUS

## 2021-02-02 MED ORDER — LACTATED RINGERS IV SOLN: INTRAVENOUS | Status: DC

## 2021-02-02 MED ORDER — FENTANYL CITRATE (PF) 100 MCG/2ML IJ SOLN
INTRAMUSCULAR | Status: AC
  Filled 2021-02-02: qty 2

## 2021-02-02 MED ORDER — OXYCODONE-ACETAMINOPHEN 5-325 MG PO TABS
1.0000 | ORAL_TABLET | ORAL | 0 refills | Status: DC | PRN
  Filled 2021-02-02: qty 15, 3d supply, fill #0

## 2021-02-02 MED ORDER — ACETAMINOPHEN 500 MG PO TABS
1000.0000 mg | ORAL_TABLET | Freq: Once | ORAL | Status: AC
  Administered 2021-02-02: 1000 mg via ORAL

## 2021-02-02 MED ORDER — PHENYLEPHRINE 40 MCG/ML (10ML) SYRINGE FOR IV PUSH (FOR BLOOD PRESSURE SUPPORT)
PREFILLED_SYRINGE | INTRAVENOUS | Status: DC | PRN
  Administered 2021-02-02: 160 ug via INTRAVENOUS
  Administered 2021-02-02: 240 ug via INTRAVENOUS

## 2021-02-02 MED ORDER — SCOPOLAMINE 1 MG/3DAYS TD PT72
MEDICATED_PATCH | TRANSDERMAL | Status: AC
  Filled 2021-02-02: qty 1

## 2021-02-02 MED ORDER — PHENYLEPHRINE 40 MCG/ML (10ML) SYRINGE FOR IV PUSH (FOR BLOOD PRESSURE SUPPORT)
PREFILLED_SYRINGE | INTRAVENOUS | Status: AC
  Filled 2021-02-02: qty 10

## 2021-02-02 MED ORDER — DEXAMETHASONE SODIUM PHOSPHATE 10 MG/ML IJ SOLN
INTRAMUSCULAR | Status: AC
  Filled 2021-02-02: qty 1

## 2021-02-02 MED ORDER — KETOROLAC TROMETHAMINE 30 MG/ML IJ SOLN
INTRAMUSCULAR | Status: DC | PRN
  Administered 2021-02-02: 30 mg via INTRAVENOUS

## 2021-02-02 MED ORDER — MIDAZOLAM HCL 2 MG/2ML IJ SOLN
INTRAMUSCULAR | Status: AC
  Filled 2021-02-02: qty 2

## 2021-02-02 MED ORDER — ONDANSETRON HCL 4 MG/2ML IJ SOLN
INTRAMUSCULAR | Status: DC | PRN
  Administered 2021-02-02: 4 mg via INTRAVENOUS

## 2021-02-02 MED ORDER — PHENYLEPHRINE HCL (PRESSORS) 10 MG/ML IV SOLN
INTRAVENOUS | Status: AC
  Filled 2021-02-02: qty 1

## 2021-02-02 MED ORDER — FENTANYL CITRATE (PF) 100 MCG/2ML IJ SOLN
25.0000 ug | INTRAMUSCULAR | Status: DC | PRN
  Administered 2021-02-02: 50 ug via INTRAVENOUS

## 2021-02-02 MED ORDER — AMISULPRIDE (ANTIEMETIC) 5 MG/2ML IV SOLN: 10.0000 mg | Freq: Once | INTRAVENOUS | Status: DC | PRN

## 2021-02-02 MED ORDER — PHENYLEPHRINE HCL-NACL 10-0.9 MG/250ML-% IV SOLN
INTRAVENOUS | Status: DC | PRN
  Administered 2021-02-02: 50 ug/min via INTRAVENOUS

## 2021-02-02 MED ORDER — PROPOFOL 10 MG/ML IV BOLUS
INTRAVENOUS | Status: AC
  Filled 2021-02-02: qty 20

## 2021-02-02 MED ORDER — PROMETHAZINE HCL 25 MG/ML IJ SOLN: 6.2500 mg | INTRAMUSCULAR | Status: DC | PRN

## 2021-02-02 MED ORDER — ACETAMINOPHEN 500 MG PO TABS
ORAL_TABLET | ORAL | Status: AC
  Filled 2021-02-02: qty 2

## 2021-02-02 MED ORDER — DEXAMETHASONE SODIUM PHOSPHATE 10 MG/ML IJ SOLN
INTRAMUSCULAR | Status: DC | PRN
  Administered 2021-02-02: 4 mg via INTRAVENOUS

## 2021-02-02 MED ORDER — POVIDONE-IODINE 10 % EX SWAB: 2.0000 "application " | Freq: Once | CUTANEOUS | Status: DC

## 2021-02-02 MED ORDER — LIDOCAINE HCL (PF) 1 % IJ SOLN
INTRAMUSCULAR | Status: DC | PRN
  Administered 2021-02-02: 10 mL

## 2021-02-02 MED ORDER — LIDOCAINE 2% (20 MG/ML) 5 ML SYRINGE
INTRAMUSCULAR | Status: DC | PRN
  Administered 2021-02-02: 60 mg via INTRAVENOUS

## 2021-02-02 MED ORDER — MIDAZOLAM HCL 2 MG/2ML IJ SOLN
INTRAMUSCULAR | Status: DC | PRN
  Administered 2021-02-02: 2 mg via INTRAVENOUS

## 2021-02-02 MED ORDER — PROPOFOL 10 MG/ML IV BOLUS
INTRAVENOUS | Status: DC | PRN
  Administered 2021-02-02: 200 mg via INTRAVENOUS

## 2021-02-02 MED ORDER — IBUPROFEN 600 MG PO TABS
600.0000 mg | ORAL_TABLET | Freq: Four times a day (QID) | ORAL | 3 refills | Status: DC | PRN
  Filled 2021-02-02: qty 60, 15d supply, fill #0

## 2021-02-02 SURGICAL SUPPLY — 22 items
BIPOLAR CUTTING LOOP 21FR (ELECTRODE)
CANISTER SUCT 3000ML PPV (MISCELLANEOUS) ×1 IMPLANT
CATH ROBINSON RED A/P 16FR (CATHETERS) IMPLANT
COVER WAND RF STERILE (DRAPES) ×2 IMPLANT
DEVICE MYOSURE LITE (MISCELLANEOUS) ×1 IMPLANT
DILATOR CANAL MILEX (MISCELLANEOUS) IMPLANT
DRSG TEGADERM 4X4.75 (GAUZE/BANDAGES/DRESSINGS) ×2 IMPLANT
ELECT REM PT RETURN 9FT ADLT (ELECTROSURGICAL)
ELECTRODE REM PT RTRN 9FT ADLT (ELECTROSURGICAL) IMPLANT
GLOVE SURG POLYISO LF SZ6.5 (GLOVE) ×2 IMPLANT
GLOVE SURG POLYISO LF SZ7 (GLOVE) ×2 IMPLANT
GLOVE SURG UNDER POLY LF SZ7.5 (GLOVE) ×3 IMPLANT
GOWN STRL REUS W/TWL LRG LVL3 (GOWN DISPOSABLE) ×2 IMPLANT
KIT PROCEDURE FLUENT (KITS) ×2 IMPLANT
KIT TURNOVER CYSTO (KITS) ×2 IMPLANT
LOOP CUTTING BIPOLAR 21FR (ELECTRODE) IMPLANT
PACK VAGINAL MINOR WOMEN LF (CUSTOM PROCEDURE TRAY) ×2 IMPLANT
PAD OB MATERNITY 4.3X12.25 (PERSONAL CARE ITEMS) ×2 IMPLANT
PAD PREP 24X48 CUFFED NSTRL (MISCELLANEOUS) ×2 IMPLANT
SEAL ROD LENS SCOPE MYOSURE (ABLATOR) ×2 IMPLANT
TOWEL OR 17X26 10 PK STRL BLUE (TOWEL DISPOSABLE) ×2 IMPLANT
WATER STERILE IRR 500ML POUR (IV SOLUTION) ×1 IMPLANT

## 2021-02-02 NOTE — Anesthesia Preprocedure Evaluation (Addendum)
Anesthesia Evaluation  Patient identified by MRN, date of birth, ID band Patient awake    Reviewed: Allergy & Precautions, NPO status , Patient's Chart, lab work & pertinent test results  History of Anesthesia Complications (+) PONV and history of anesthetic complications  Airway Mallampati: II  TM Distance: >3 FB Neck ROM: Full    Dental no notable dental hx.    Pulmonary sleep apnea and Continuous Positive Airway Pressure Ventilation , former smoker,    Pulmonary exam normal breath sounds clear to auscultation       Cardiovascular hypertension, Pt. on medications Normal cardiovascular exam Rhythm:Regular Rate:Normal  ECG: NSR, rate 70   Neuro/Psych  Headaches, PSYCHIATRIC DISORDERS Anxiety Depression    GI/Hepatic Neg liver ROS, GERD  Medicated and Controlled,  Endo/Other  negative endocrine ROS  Renal/GU negative Renal ROS     Musculoskeletal  (+) Arthritis ,   Abdominal (+) + obese,   Peds  Hematology HLD   Anesthesia Other Findings PMB  Reproductive/Obstetrics                           Anesthesia Physical Anesthesia Plan  ASA: III  Anesthesia Plan: General   Post-op Pain Management:    Induction: Intravenous  PONV Risk Score and Plan: 4 or greater and Ondansetron, Dexamethasone, Propofol infusion, Midazolam, Scopolamine patch - Pre-op, Amisulpride and Treatment may vary due to age or medical condition  Airway Management Planned: LMA  Additional Equipment:   Intra-op Plan:   Post-operative Plan: Extubation in OR  Informed Consent: I have reviewed the patients History and Physical, chart, labs and discussed the procedure including the risks, benefits and alternatives for the proposed anesthesia with the patient or authorized representative who has indicated his/her understanding and acceptance.     Dental advisory given and Consent reviewed with POA  Plan Discussed with:  CRNA  Anesthesia Plan Comments:       Anesthesia Quick Evaluation

## 2021-02-02 NOTE — Op Note (Signed)
PREOPERATIVE DIAGNOSIS: Postmenopausal vaginal bleeding. POSTOPERATIVE DIAGNOSIS: The same PROCEDURE: Hysteroscopy, Dilation and Curettage, polypectomy SURGEON:  Dr. Mora Bellman   INDICATIONS: 60 y.o. [id number]  here for scheduled surgery for postmenopausal vaginal bleeding.   Risks of surgery were discussed with the patient including but not limited to: bleeding which may require transfusion; infection which may require antibiotics; injury to uterus or surrounding organs; intrauterine scarring which may impair future fertility; need for additional procedures including laparotomy or laparoscopy; and other postoperative/anesthesia complications. Written informed consent was obtained.    FINDINGS:  A 7 week size uterus.  Atrophic endometrium with 3-4 sub centimeter polyps. Small submucosal fibroid calcified. Normal ostia bilaterally.  ANESTHESIA:   General, paracervical block. INTRAVENOUS FLUIDS:  750 ml of LR FLUID DEFICITS:  67ml of normal saline ESTIMATED BLOOD LOSS:  Less than 20 ml SPECIMENS: fragments of polyp sent to pathology COMPLICATIONS:  None immediate.  PROCEDURE DETAILS:  The patient was taken to the operating room where general anesthesia was administered and was found to be adequate.  After an adequate timeout was performed, she was placed in the dorsal lithotomy position and examined; then prepped and draped in the sterile manner.   Her bladder was catheterized for an unmeasured amount of clear, yellow urine. A speculum was then placed in the patient's vagina and a single tooth tenaculum was applied to the anterior lip of the cervix.   A paracervical block using 10 ml of 0.5% Marcaine was administered.  The cervix was sounded to 7 cm and dilated manually with Hagar dilators to accommodate the operative hysteroscope.  Once the cervix was dilated, the hysteroscope was inserted under direct visualization using normal saline as a suspension medium.  The uterine cavity was carefully  examined, both ostia were recognized, and the above noted findings.  The myosure was inserted through the scope and polypectomy was performed.  After further careful visualization of the uterine cavity, the hysteroscope was removed under direct visualization.  The tenaculum was removed from the anterior lip of the cervix and the vaginal speculum was removed after noting good hemostasis.  The patient tolerated the procedure well and was taken to the recovery area awake, extubated and in stable condition.

## 2021-02-02 NOTE — Discharge Instructions (Signed)
Dilatacin y curetaje o curetaje por aspiracin, cuidados posteriores Dilation and Curettage or Vacuum Curettage, Care After Esta hoja le brinda informacin sobre cmo cuidarse despus del procedimiento. Su mdico tambin podr darle instrucciones ms especficas. Comunquese con el mdico si tiene problemas o preguntas. Qu puedo esperar despus del procedimiento? Despus del procedimiento, es normal tener los siguientes sntomas:  Dolor o clicos leves.  Un poco de sangrado o manchado vaginal. Estos sntomas pueden durar hasta 2 semanas despus del procedimiento. Siga estas instrucciones en su casa: Medicamentos  Use los medicamentos de venta libre y los recetados solamente como se lo haya indicado el mdico. Esto es muy importante si toma o se aplica anticoagulantes.  Pregntele al mdico si el medicamento recetado le impide conducir o usar [place]. Actividad  Si le administraron un sedante durante el procedimiento, puede afectarla por varias horas. No conduzca ni opere maquinaria hasta que el mdico le indique que es seguro [place].  Haga reposo como se lo haya indicado el mdico.  Evite estar sentada durante largos perodos sin moverse. Levntese y camine un poco cada 1 a 2 horas. Esto es importante para mejorar el flujo sanguneo y la respiracin. Pida ayuda si se siente dbil o inestable.  No levante ningn objeto que pese ms de 10 libras (4.5 kg) o que supere el lmite de peso que le hayan indicado, Nurse, children's que el mdico le diga que puede [place].  Retome sus actividades normales segn lo indicado por el mdico. Pregntele al mdico qu actividades son seguras para usted.   Estilo de vida [place] al menos 2 semanas o el tiempo que le haya indicado el mdico, no debe:  Patent attorney.  Usar tampones.  Tener [employer]. Instrucciones generales  Use medias de compresin como se lo haya indicado su mdico. Estas medias ayudan a evitar la formacin de  cogulos de sangre y a reducir la hinchazn de las piernas.  Es su responsabilidad retirar [employer] del procedimiento. Pregntele a su mdico, o a un miembro del personal del departamento donde se realice el procedimiento, cundo estarn [employer].  Concurra a todas las visitas de seguimiento como se lo haya indicado el mdico. Esto es importante. Comunquese con un mdico si:  Tiene clicos intensos que empeoran o no mejoran con los medicamentos.  Siente un dolor intenso en el abdomen.  No puede beber lquidos sin vomitar.  Siente dolor en otra zona de la pelvis.  Tiene secrecin vaginal con mal olor.  Tiene una erupcin cutnea. Solicite ayuda de inmediato si:  Tiene una hemorragia vaginal que empapa ms de una compresa higinica en 1 hora, durante 2 horas consecutivas, o despide cogulos grandes por la vagina.  Tiene fiebre de ms de 100.4 F (38.0 C).  Siente dureza o dolor a la palpacin en el abdomen.  Siente dolor en el pecho.  Le falta el aire.  Se siente mareada, tiene vahdos o se desmaya.  Siente dolor en la zona del cuello o los hombros. Estos sntomas pueden representar un problema grave que constituye Engineer, maintenance (IT). No espere a ver si los sntomas desaparecen. Solicite atencin mdica de inmediato. Comunquese con el servicio de emergencias de su localidad (911 en los [place]). No conduzca por sus propios medios [employer]. Resumen  Despus del procedimiento, es frecuente Patent attorney leve y un poco de sangrado o manchado vaginal que puede durar un mximo de 2 semanas.  Haga reposo segn las indicaciones que le hayan dado. Evite estar sentada  durante largos perodos sin moverse. Levntese y camine un poco cada 1 a 2 horas.  No levante objetos que pesen ms de 10 libras (4.5 kg) o que sean ms pesados de lo que le indicaron.  Obsrvese para detectar cualquier complicacin que pueda surgir despus del procedimiento.  Concurra a  todas las visitas de seguimiento como se lo haya indicado el mdico. Conozca cules son los sntomas por los que debe obtener ayuda de inmediato. Esta informacin no tiene Marine scientist el consejo del mdico. Asegrese de hacerle al mdico cualquier pregunta que tenga. Document Revised: 08/19/2020 Document Reviewed: 01/27/2020 Elsevier Patient Education  [place].

## 2021-02-02 NOTE — Anesthesia Procedure Notes (Addendum)
Procedure Name: LMA Insertion Date/Time: 02/02/2021 11:08 AM Performed by: Mechele Claude, CRNA Pre-anesthesia Checklist: Patient identified, Emergency Drugs available, Suction available and Patient being monitored Patient Re-evaluated:Patient Re-evaluated prior to induction Oxygen Delivery Method: Circle system utilized Preoxygenation: Pre-oxygenation with 100% oxygen Induction Type: IV induction Ventilation: Mask ventilation without difficulty LMA: LMA inserted LMA Size: 4.0 Number of attempts: 1 Airway Equipment and Method: Bite block Placement Confirmation: positive ETCO2 Tube secured with: Tape Dental Injury: Teeth and Oropharynx as per pre-operative assessment

## 2021-02-02 NOTE — Transfer of Care (Signed)
Immediate Anesthesia Transfer of Care Note  Patient: Susan Lopez  Procedure(s) Performed: Procedure(s) (LRB): DILATATION & CURETTAGE/HYSTEROSCOPY WITH MYOSURE POLYPECTOMY (N/A)  Patient Location: PACU  Anesthesia Type: General  Level of Consciousness: awake, alert  and oriented  Airway & Oxygen Therapy: Patient Spontanous Breathing and Patient connected to nasal cannula oxygen  Post-op Assessment: Report given to PACU RN and Post -op Vital signs reviewed and stable  Post vital signs: Reviewed and stable  Complications: No apparent anesthesia complications Last Vitals:  Vitals Value Taken Time  BP 135/85 02/02/21 1145  Temp    Pulse 77 02/02/21 1146  Resp 18 02/02/21 1146  SpO2 96 % 02/02/21 1146  Vitals shown include unvalidated device data.  Last Pain:  Vitals:   02/02/21 1019  TempSrc: Oral  PainSc: 0-No pain         Complications: No complications documented.

## 2021-02-02 NOTE — Anesthesia Postprocedure Evaluation (Signed)
Anesthesia Post Note  Patient: Susan Lopez  Procedure(s) Performed: DILATATION & CURETTAGE/HYSTEROSCOPY WITH MYOSURE POLYPECTOMY (N/A Vagina )     Patient location during evaluation: PACU Anesthesia Type: General Level of consciousness: awake Pain management: pain level controlled Vital Signs Assessment: post-procedure vital signs reviewed and stable Respiratory status: spontaneous breathing, nonlabored ventilation, respiratory function stable and patient connected to nasal cannula oxygen Cardiovascular status: blood pressure returned to baseline and stable Postop Assessment: no apparent nausea or vomiting Anesthetic complications: no   No complications documented.  Last Vitals:  Vitals:   02/02/21 1215 02/02/21 1254  BP: (!) 108/56 (!) 119/57  Pulse: 70 (!) 51  Resp: 14 14  Temp:    SpO2: 93% 93%    Last Pain:  Vitals:   02/02/21 1254  TempSrc:   PainSc: 5                  Harmony Sandell P Milaina Sher

## 2021-02-03 ENCOUNTER — Encounter (HOSPITAL_BASED_OUTPATIENT_CLINIC_OR_DEPARTMENT_OTHER): Payer: Self-pay | Admitting: Obstetrics and Gynecology

## 2021-02-03 LAB — SURGICAL PATHOLOGY

## 2021-02-04 ENCOUNTER — Ambulatory Visit
Admission: RE | Admit: 2021-02-04 | Discharge: 2021-02-04 | Disposition: A | Discharge status: Home / Self Care | Payer: No Typology Code available for payment source | Source: Ambulatory Visit | Attending: Obstetrics and Gynecology | Admitting: Obstetrics and Gynecology

## 2021-02-04 ENCOUNTER — Ambulatory Visit: Payer: No Typology Code available for payment source | Admitting: *Deleted

## 2021-02-04 ENCOUNTER — Other Ambulatory Visit: Payer: Self-pay

## 2021-02-04 VITALS — BP 122/78 | Wt 177.2 lb

## 2021-02-04 DIAGNOSIS — Z1231 Encounter for screening mammogram for malignant neoplasm of breast: Secondary | ICD-10-CM

## 2021-02-04 DIAGNOSIS — Z1239 Encounter for other screening for malignant neoplasm of breast: Secondary | ICD-10-CM

## 2021-02-04 NOTE — Progress Notes (Signed)
[name] is a 60 y.o. female who presents to [facility] today with complaint of left outer breast pain since 2014 that comes and goes. Patient rates the pain at a 6 out of 10. Patient stated the pain was the same as noted on exam 09/28/2017. Follow-up Diagnostic mammogram was completed 09/28/2017 and negative.    Pap Smear: Pap smear not completed today. Last Pap smear was 11/26/2020 at [facility] and was abnormal - ASCUS with negative HPV. Pap smear in one year recommended for follow-up. Per patient has no history of an abnormal Pap smear prior to her most recent Pap smear. Last Pap smear result is available in [facility].   Physical exam: Breasts Breasts symmetrical. No skin abnormalities bilateral breasts. No nipple retraction bilateral breasts. No nipple discharge bilateral breasts. No lymphadenopathy. No lumps palpated bilateral breasts. Complaints of left outer breast pain on exam.      MM DIAG BREAST TOMO BILATERAL  Result Date: 09/28/2017 CLINICAL DATA:  Two areas of concern in the left breast. Possible palpable abnormality and pain at each site. EXAM: 2D DIGITAL DIAGNOSTIC BILATERAL MAMMOGRAM WITH CAD AND ADJUNCT TOMO ULTRASOUND LEFT BREAST COMPARISON:  12/25/2015 and earlier ACR Breast Density Category b: There are scattered areas of fibroglandular density. FINDINGS: No suspicious mass, distortion, or microcalcifications are identified to suggest presence of malignancy. Spot tangential views are performed over the areas of concern to the patient. These views show normal appearing fibrofatty tissue without suspicious mass or distortion. Mammographic images were processed with CAD. Targeted ultrasound is performed, showing normal appearing fibroglandular tissue in the far lateral portion of the left breast. Normal appearing fibroglandular tissue is also imaged in the 1 o'clock location of the left breast. No mass, distortion, or acoustic  shadowing is demonstrated with ultrasound. IMPRESSION: No mammographic or ultrasound evidence for malignancy. RECOMMENDATION: Screening mammogram in one year.(Code:SM-B-01Y) I have discussed the findings and recommendations with the patient with the assistance of an interpreter. Results were also provided in writing at the conclusion of the visit. If applicable, a reminder letter will be sent to the patient regarding the next appointment. BI-RADS CATEGORY  1: Negative. Electronically Signed   By: Nolon Nations M.D.   On: 09/28/2017 15:46   MS DIGITAL SCREENING TOMO BILATERAL  Result Date: 01/01/2020 CLINICAL DATA:  Screening. EXAM: DIGITAL SCREENING BILATERAL MAMMOGRAM WITH TOMO AND CAD COMPARISON:  Previous exam(s). ACR Breast Density Category b: There are scattered areas of fibroglandular density. FINDINGS: There are no findings suspicious for malignancy. Images were processed with CAD. IMPRESSION: No mammographic evidence of malignancy. A result letter of this screening mammogram will be mailed directly to the patient. RECOMMENDATION: Screening mammogram in one year. (Code:SM-B-01Y) BI-RADS CATEGORY  1: Negative. Electronically Signed   By: Kristopher Oppenheim M.D.   On: 01/01/2020 12:58    Pelvic/Bimanual Pap is not indicated today per BCCCP guidelines.    Smoking History: Patient is a former smoker that quit in 2008 per patient.   Patient Navigation: Patient education provided. Access to services provided for patient through [facility]. Spanish interpreter Rudene Anda from [facility] provided.   Colorectal Cancer Screening: Per patient had a colonoscopy completed 3 years ago. No complaints today.    Breast and Cervical Cancer Risk Assessment: Patient does not have family history of breast cancer, known genetic mutations, or radiation treatment to the chest before age 7. Patient does not have history of cervical dysplasia, immunocompromised, or DES exposure  in-utero.  Risk Assessment     Risk Scores      02/04/2021 12/31/2019   Last edited by: Royston Bake, CMA McGill, Sherie Mamie Nick, LPN   5-year risk: 0.8 % 0.8 %   Lifetime risk: 4.3 % 4.5 %          A: BCCCP exam without pap smear Complaints of left outer breast pain.  P: Referred patient to the [facility] for a screening mammogram on the mobile unit. Appointment scheduled Thursday, February 04, 2021 at 1500.  Loletta Parish, RN 02/04/2021 2:26 PM

## 2021-02-04 NOTE — Patient Instructions (Signed)
Explained breast self awareness with [facility]. Patient did not need a Pap smear today due to last Pap smear was 11/26/2020. Let patient know that her next Pap smear is due in one year due to her last Pap smear was abnormal. Referred patient to the [facility] for a screening mammogram on the mobile unit. Appointment scheduled Thursday, February 04, 2021 at 1500. Patient escorted to the mobile unit following BCCCP appointment for her screening mammogram. Let patient know the [facility] will follow up with her within the next couple weeks with results of her mammogram by letter or phone. [facility] verbalized understanding.  Tavaras Goody, Arvil Chaco, RN 2:26 PM

## 2021-02-16 ENCOUNTER — Encounter: Payer: Self-pay | Admitting: Family Medicine

## 2021-02-16 ENCOUNTER — Ambulatory Visit: Payer: Self-pay | Attending: Family Medicine | Admitting: Family Medicine

## 2021-02-16 ENCOUNTER — Other Ambulatory Visit: Payer: Self-pay

## 2021-02-16 VITALS — BP 121/74 | HR 93 | Ht 62.0 in | Wt 176.0 lb

## 2021-02-16 DIAGNOSIS — K921 Melena: Secondary | ICD-10-CM

## 2021-02-16 DIAGNOSIS — R1032 Left lower quadrant pain: Secondary | ICD-10-CM

## 2021-02-16 DIAGNOSIS — K5909 Other constipation: Secondary | ICD-10-CM

## 2021-02-16 MED ORDER — HYDROCORT-PRAMOXINE (PERIANAL) 2.5-1 % EX CREA
1.0000 "application " | TOPICAL_CREAM | Freq: Three times a day (TID) | CUTANEOUS | 2 refills | Status: DC
  Filled 2021-02-16: qty 30, 10d supply, fill #0

## 2021-02-16 MED ORDER — POLYETHYLENE GLYCOL 3350 17 GM/SCOOP PO POWD
17.0000 g | Freq: Every day | ORAL | 2 refills | Status: DC
  Filled 2021-02-16: qty 510, 30d supply, fill #0

## 2021-02-16 NOTE — Progress Notes (Signed)
Subjective:  Patient ID: Susan [facility], female    DOB: 03-23-1961  Age: 60 y.o. MRN: [id number]  CC: Abdominal Pain   HPI Susan Lopez is a 60 year old female who presents for an acute visit. 4 days ago she had LLQ abdominal pain and when she moved her bowel it was associated with production of blood and this occurred over 2 days on a couple of different occassions with amount of blood progressively decreasing each time. Blood was noticed after compltion of bowel movement and was bright red.Two years ago she had a similar occurrence. She currently has mild LLQ pain which is ongoing. Denies presence of diarrhea, fever, nausea or vomiting. Last colonoscopy in 2019 revealed presence of 2 sessile polyps which were removed and pathology was negative for malignancy.  Repeat colonoscopy recommended in 5 years-due in 2024.  On 4/522 she has D&C with hysterectomy and myosure polypectomy.  Past Medical History:  Diagnosis Date  . Anxiety   . Arthritis   . Chronic gastritis   . Chronic thoracic spine pain   . Depression   . GERD (gastroesophageal reflux disease)   . Hemorrhoids, external 10-21-11   occ. bothersome  . History of colon polyps   . Hyperlipidemia   . Hypertension   . Iron deficiency anemia   . OSA on CPAP    study in epic 08-29-2017 severe osa (AHI 32.5), per pt daughter uses nightly  . PMB (postmenopausal bleeding)   . PONV (postoperative nausea and vomiting)   . Pre-diabetes     Past Surgical History:  Procedure Laterality Date  . [place]  . CHOLECYSTECTOMY  10/27/2011   Procedure: LAPAROSCOPIC CHOLECYSTECTOMY WITH INTRAOPERATIVE CHOLANGIOGRAM;  Surgeon: Adin Hector, MD;  [facility];  Service: General;  Laterality: N/A;  Laparoscopic Chole w/ IOC Single Site  . COLONOSCOPY WITH PROPOFOL  last one 05-09-2018  . DILATATION & CURETTAGE/HYSTEROSCOPY WITH MYOSURE N/A 03/11/2020   Procedure: DILATATION AND CURETTAGE  /HYSTEROSCOPY WITH MYOSURE;  Surgeon: Mora Bellman, MD;  Location: [facility];  Service: Gynecology;  Laterality: N/A;  . DILATATION & CURETTAGE/HYSTEROSCOPY WITH MYOSURE N/A 02/02/2021   Procedure: DILATATION & CURETTAGE/HYSTEROSCOPY WITH MYOSURE POLYPECTOMY;  Surgeon: Mora Bellman, MD;  Location: [facility];  Service: Gynecology;  Laterality: N/A;  . ESOPHAGOGASTRODUODENOSCOPY (EGD) WITH PROPOFOL  last one 12-30-2019  dr Loletha Carrow  . EYE SURGERY  10/21/2011   bil. for tissue growth-laser surgery  . HARDWARE REMOVAL Left 03/16/2016   Procedure: LEFT ANKLE HARDWARE REMOVAL;  Surgeon: Leandrew Koyanagi, MD;  Location: [facility];  Service: Orthopedics;  Laterality: Left;  Marland Kitchen MM BREAST STEREO BIOPSY LEFT ([place] HX) Left 2015  . ORIF ANKLE FRACTURE Left 11/13/2015   Procedure: OPEN REDUCTION INTERNAL FIXATION (ORIF) LEFT BIMALLEOLAR ANKLE FRACTURE;  Surgeon: Leandrew Koyanagi, MD;  Location: [facility];  Service: Orthopedics;  Laterality: Left;    Family History  Problem Relation Age of Onset  . Hypertension Mother   . Cancer Mother   . Hypertension Father   . Arthritis Brother   . Hypertension Brother   . Intellectual disability Paternal Aunt   . Colon cancer Neg Hx     Allergies  Allergen Reactions  . Hydrocodone Nausea And Vomiting  . Tramadol Nausea Only  . Oxycodone Rash    Outpatient Medications Prior to Visit  Medication Sig Dispense Refill  . atorvastatin (LIPITOR) 20 MG tablet TAKE 1 TABLET (20 MG TOTAL) BY  MOUTH DAILY. 30 tablet 3  . gabapentin (NEURONTIN) 100 MG capsule TAKE 1 CAPSULE BY MOUTH 2 TIMES DAILY. 60 capsule 3  . ibuprofen (ADVIL) 600 MG tablet Take 1 tablet (600 mg total) by mouth every 6 (six) hours as needed. 60 tablet 3  . lisinopril (ZESTRIL) 10 MG tablet TAKE 1 TABLET (10 MG TOTAL) BY MOUTH DAILY. 30 tablet 3  . meloxicam (MOBIC) 7.5 MG tablet TAKE 1 TABLET (7.5 MG TOTAL) BY MOUTH DAILY. 30 tablet 3  . Multiple Vitamin  (MULTIVITAMIN WITH MINERALS) TABS tablet Take 1 tablet by mouth daily.    Marland Kitchen omeprazole (PRILOSEC) 40 MG capsule TOME 1 PASTILLA POR VIA ORAL DOS VECES AL DIA A REDUCIR EL ACIDO DEL [place] (Patient taking differently: Take 40 mg by mouth daily.) 180 capsule 1  . Probiotic Product (PROBIOTIC PO) Take by mouth daily.    . psyllium (METAMUCIL) 58.6 % powder Take 1 packet by mouth as needed. 2 x weekly    . UNABLE TO FIND Calcium, magnesium & zinc (all in 1), once daily    . polyethylene glycol powder (GLYCOLAX/MIRALAX) powder Take 17 g by mouth daily. As needed for constipation relief 3350 g 11  . clotrimazole-betamethasone (LOTRISONE) cream Apply 1 application topically 2 (two) times daily. As needed for skin irritation (Patient not taking: No sig reported) 30 g 2  . oxyCODONE-acetaminophen (PERCOCET/ROXICET) 5-325 MG tablet Take 1 tablet by mouth every 4 (four) hours as needed. (Patient not taking: No sig reported) 15 tablet 0   No facility-administered medications prior to visit.     ROS Review of Systems  Constitutional: Negative for activity change, appetite change and fatigue.  HENT: Negative for congestion, sinus pressure and sore throat.   Eyes: Negative for visual disturbance.  Respiratory: Negative for cough, chest tightness, shortness of breath and wheezing.   Cardiovascular: Negative for chest pain and palpitations.  Gastrointestinal: Positive for abdominal pain and blood in stool. Negative for abdominal distention and constipation.  Endocrine: Negative for polydipsia.  Genitourinary: Negative for dysuria and frequency.  Musculoskeletal: Negative for arthralgias and back pain.  Skin: Negative for rash.  Neurological: Negative for tremors, light-headedness and numbness.  Hematological: Does not bruise/bleed easily.  Psychiatric/Behavioral: Negative for agitation and behavioral problems.    Objective:  BP 121/74   Pulse 93   Ht 5\' 2"  (1.575 m)   Wt 176 lb (79.8 kg)   LMP  10/08/2015   SpO2 98%   BMI 32.19 kg/m   BP/Weight 02/16/2021 02/28/257 03/01/7781  Systolic BP 423 536 144  Diastolic BP 74 78 57  Wt. (Lbs) 176 177.2 172.6  BMI 32.19 32.41 31.57      Physical Exam Constitutional:      Appearance: She is well-developed.  Neck:     Vascular: No JVD.  Cardiovascular:     Rate and Rhythm: Normal rate.     Heart sounds: Normal heart sounds. No murmur heard.   Pulmonary:     Effort: Pulmonary effort is normal.     Breath sounds: Normal breath sounds. No wheezing or rales.  Chest:     Chest wall: No tenderness.  Abdominal:     General: Bowel sounds are normal. There is no distension.     Palpations: Abdomen is soft. There is no mass.     Tenderness: There is no abdominal tenderness.  Musculoskeletal:        General: Normal range of motion.     Right lower leg: No edema.  Left lower leg: No edema.  Neurological:     Mental Status: She is alert and oriented to person, place, and time.  Psychiatric:        Mood and Affect: Mood normal.     CMP Latest Ref Rng & Units 02/02/2021 10/05/2020 12/06/2019  Glucose 70 - 99 mg/dL 109(H) 132(H) 158(H)  BUN 6 - 20 mg/dL 10 10 9   Creatinine 0.44 - 1.00 mg/dL 0.40(L) 0.71 0.65  Sodium 135 - 145 mmol/L 143 142 141  Potassium 3.5 - 5.1 mmol/L 3.8 3.9 4.0  Chloride 98 - 111 mmol/L 104 109(H) 105  CO2 20 - 29 mmol/L - 18(L) 21  Calcium 8.7 - 10.2 mg/dL - 8.9 9.3  Total Protein 6.0 - 8.5 g/dL - 6.6 7.0  Total Bilirubin 0.0 - 1.2 mg/dL - 0.3 0.4  Alkaline Phos 44 - 121 IU/L - 110 135(H)  AST 0 - 40 IU/L - 13 23  ALT 0 - 32 IU/L - 22 53(H)    Lipid Panel     Component Value Date/Time   CHOL 173 12/13/2017 1150   TRIG 230 (H) 12/13/2017 1150   HDL 45 12/13/2017 1150   CHOLHDL 3.8 12/13/2017 1150   CHOLHDL 5.3 (H) 11/23/2016 0952   VLDL 61 (H) 11/23/2016 0952   LDLCALC 82 12/13/2017 1150    CBC    Component Value Date/Time   WBC 7.0 11/26/2020 1459   WBC 7.0 03/09/2020 1017   RBC 4.59  11/26/2020 1459   RBC 4.70 03/09/2020 1017   HGB 14.6 02/02/2021 1028   HGB 15.0 11/26/2020 1459   HGB 14.9 10/08/2013 0853   HCT 43.0 02/02/2021 1028   HCT 43.0 11/26/2020 1459   HCT 43.2 10/08/2013 0853   PLT 344 11/26/2020 1459   MCV 94 11/26/2020 1459   MCV 92.8 10/08/2013 0853   MCH 32.7 11/26/2020 1459   MCH 31.1 03/09/2020 1017   MCHC 34.9 11/26/2020 1459   MCHC 32.7 03/09/2020 1017   RDW 12.8 11/26/2020 1459   RDW 12.8 10/08/2013 0853   LYMPHSABS 2.6 10/05/2020 1608   LYMPHSABS 2.1 10/08/2013 0853   MONOABS 0.4 03/07/2018 1046   MONOABS 0.4 10/08/2013 0853   EOSABS 0.2 10/05/2020 1608   BASOSABS 0.1 10/05/2020 1608   BASOSABS 0.1 10/08/2013 0853    Lab Results  Component Value Date   HGBA1C 5.8 (A) 10/05/2020    Assessment & Plan:  1. Hematochezia Likely secondary to constipation Will treat with hemorrhoidal cream Will order CT scan to exclude presence of diverticulitis Last colonoscopy was negative for malignancy; next due in 2024. - hydrocortisone-pramoxine (ANALPRAM HC) 2.5-1 % rectal cream; Place 1 application rectally 3 (three) times daily.  Dispense: 30 g; Refill: 2 - CT Abdomen Pelvis W Contrast; Future - CBC with Differential/Platelet  2. Other constipation Increase fiber intake Will place on MiraLAX - polyethylene glycol powder (GLYCOLAX/MIRALAX) 17 GM/SCOOP powder; Take 17 g by mouth daily. As needed for constipation relief  Dispense: 3350 g; Refill: 2  3. Left lower quadrant pain Will need to exclude presence of diverticulitis. - CT Abdomen Pelvis W Contrast; Future    Meds ordered this encounter  Medications  . hydrocortisone-pramoxine (ANALPRAM HC) 2.5-1 % rectal cream    Sig: Place 1 application rectally 3 (three) times daily.    Dispense:  30 g    Refill:  2  . polyethylene glycol powder (GLYCOLAX/MIRALAX) 17 GM/SCOOP powder    Sig: Take 17 g by mouth daily. As needed for  constipation relief    Dispense:  3350 g    Refill:  2     Follow-up: Return in about 3 months (around 05/18/2021) for chronic medical conditions.       Charlott Rakes, MD, FAAFP. [facility] [place], [place]   02/16/2021, 5:27 PM

## 2021-02-16 NOTE — Progress Notes (Signed)
Have had some rectal bleeding. Pain on left lower abdomen.

## 2021-02-17 ENCOUNTER — Other Ambulatory Visit: Payer: Self-pay

## 2021-02-17 LAB — CBC WITH DIFFERENTIAL/PLATELET
Basophils Absolute: 0.1 10*3/uL (ref 0.0–0.2)
Basos: 1 %
EOS (ABSOLUTE): 0.2 10*3/uL (ref 0.0–0.4)
Eos: 2 %
Hematocrit: 44.2 % (ref 34.0–46.6)
Hemoglobin: 14.8 g/dL (ref 11.1–15.9)
Immature Grans (Abs): 0 10*3/uL (ref 0.0–0.1)
Immature Granulocytes: 1 %
Lymphocytes Absolute: 2.6 10*3/uL (ref 0.7–3.1)
Lymphs: 30 %
MCH: 31.2 pg (ref 26.6–33.0)
MCHC: 33.5 g/dL (ref 31.5–35.7)
MCV: 93 fL (ref 79–97)
Monocytes Absolute: 0.4 10*3/uL (ref 0.1–0.9)
Monocytes: 5 %
Neutrophils Absolute: 5.4 10*3/uL (ref 1.4–7.0)
Neutrophils: 61 %
Platelets: 291 10*3/uL (ref 150–450)
RBC: 4.74 x10E6/uL (ref 3.77–5.28)
RDW: 12.2 % (ref 11.7–15.4)
WBC: 8.7 10*3/uL (ref 3.4–10.8)

## 2021-02-19 ENCOUNTER — Other Ambulatory Visit: Payer: Self-pay

## 2021-02-22 ENCOUNTER — Other Ambulatory Visit: Payer: Self-pay

## 2021-02-22 ENCOUNTER — Ambulatory Visit (HOSPITAL_COMMUNITY)
Admission: RE | Admit: 2021-02-22 | Discharge: 2021-02-22 | Disposition: A | Discharge status: Home / Self Care | Payer: No Typology Code available for payment source | Source: Ambulatory Visit | Attending: Family Medicine | Admitting: Family Medicine

## 2021-02-22 DIAGNOSIS — R1032 Left lower quadrant pain: Secondary | ICD-10-CM | POA: Insufficient documentation

## 2021-02-22 DIAGNOSIS — K921 Melena: Secondary | ICD-10-CM | POA: Insufficient documentation

## 2021-02-22 MED ORDER — IOHEXOL 300 MG/ML  SOLN
100.0000 mL | Freq: Once | INTRAMUSCULAR | Status: AC | PRN
  Administered 2021-02-22: 100 mL via INTRAVENOUS

## 2021-02-23 ENCOUNTER — Telehealth: Payer: Self-pay

## 2021-02-23 ENCOUNTER — Encounter: Payer: Self-pay | Admitting: Obstetrics and Gynecology

## 2021-02-23 ENCOUNTER — Ambulatory Visit (INDEPENDENT_AMBULATORY_CARE_PROVIDER_SITE_OTHER): Payer: No Typology Code available for payment source | Admitting: Obstetrics and Gynecology

## 2021-02-23 VITALS — BP 127/73 | HR 85 | Ht 59.06 in | Wt 175.0 lb

## 2021-02-23 DIAGNOSIS — Z9889 Other specified postprocedural states: Secondary | ICD-10-CM

## 2021-02-23 NOTE — Telephone Encounter (Signed)
-----   Message from Charlott Rakes, MD sent at 02/23/2021  1:44 PM EDT ----- Please inform her that CT of the abdomen does not reveal acute findings in her abdomen.  It does show a fatty liver and low-cholesterol diet will be beneficial.  She also has a small kidney cyst which has been present since 2015.  We will repeat CT scan in 6 months to follow-up on this.

## 2021-02-23 NOTE — Telephone Encounter (Signed)
Pt informed of CT results using spanish interpreter. Pt verbalized understanding and voiced no other concerns.

## 2021-02-23 NOTE — Progress Notes (Signed)
60 yo here for post op check s/p D&C hysteroscopy, polypectomy on 02/02/21. Patient reports doing well since her procedure without any episodes of vaginal bleeding.   Past Medical History:  Diagnosis Date  . Anxiety   . Arthritis   . Chronic gastritis   . Chronic thoracic spine pain   . Depression   . GERD (gastroesophageal reflux disease)   . Hemorrhoids, external 10-21-11   occ. bothersome  . History of colon polyps   . Hyperlipidemia   . Hypertension   . Iron deficiency anemia   . OSA on CPAP    study in epic 08-29-2017 severe osa (AHI 32.5), per pt daughter uses nightly  . PMB (postmenopausal bleeding)   . PONV (postoperative nausea and vomiting)   . Pre-diabetes    Past Surgical History:  Procedure Laterality Date  . [place]  . CHOLECYSTECTOMY  10/27/2011   Procedure: LAPAROSCOPIC CHOLECYSTECTOMY WITH INTRAOPERATIVE CHOLANGIOGRAM;  Surgeon: Adin Hector, MD;  [facility];  Service: General;  Laterality: N/A;  Laparoscopic Chole w/ IOC Single Site  . COLONOSCOPY WITH PROPOFOL  last one 05-09-2018  . DILATATION & CURETTAGE/HYSTEROSCOPY WITH MYOSURE N/A 03/11/2020   Procedure: DILATATION AND CURETTAGE /HYSTEROSCOPY WITH MYOSURE;  Surgeon: Mora Bellman, MD;  Location: [facility];  Service: Gynecology;  Laterality: N/A;  . DILATATION & CURETTAGE/HYSTEROSCOPY WITH MYOSURE N/A 02/02/2021   Procedure: DILATATION & CURETTAGE/HYSTEROSCOPY WITH MYOSURE POLYPECTOMY;  Surgeon: Mora Bellman, MD;  Location: [place] [facility];  Service: Gynecology;  Laterality: N/A;  . ESOPHAGOGASTRODUODENOSCOPY (EGD) WITH PROPOFOL  last one 12-30-2019  dr Loletha Carrow  . EYE SURGERY  10/21/2011   bil. for tissue growth-laser surgery  . HARDWARE REMOVAL Left 03/16/2016   Procedure: LEFT ANKLE HARDWARE REMOVAL;  Surgeon: Leandrew Koyanagi, MD;  Location: [facility];  Service: Orthopedics;  Laterality: Left;  Marland Kitchen MM BREAST STEREO BIOPSY LEFT ([place] HX) Left  2015  . ORIF ANKLE FRACTURE Left 11/13/2015   Procedure: OPEN REDUCTION INTERNAL FIXATION (ORIF) LEFT BIMALLEOLAR ANKLE FRACTURE;  Surgeon: Leandrew Koyanagi, MD;  Location: [facility];  Service: Orthopedics;  Laterality: Left;   Family History  Problem Relation Age of Onset  . Hypertension Mother   . Cancer Mother   . Hypertension Father   . Arthritis Brother   . Hypertension Brother   . Intellectual disability Paternal Aunt   . Colon cancer Neg Hx    Social History   Tobacco Use  . Smoking status: Former Smoker    Packs/day: 0.25    Years: 10.00    Pack years: 2.50    Quit date: 2008    Years since quitting: 14.3  . Smokeless tobacco: Never Used  Vaping Use  . Vaping Use: Never used  Substance Use Topics  . Alcohol use: No  . Drug use: Never   ROS See pertinent in HPI. All other systems reviewed and non contributory Blood pressure 127/73, pulse 85, height 4' 11.06" (1.5 m), weight 175 lb (79.4 kg), last menstrual period 10/08/2015.  GENERAL: Well-developed, well-nourished female in no acute distress.  ABDOMEN: Soft, nontender, nondistended. No organomegaly. PELVIC: Normal external female genitalia. Vagina is pink and rugated.  Normal discharge. Normal appearing cervix. Uterus is normal in size.  No adnexal mass or tenderness. EXTREMITIES: No cyanosis, clubbing, or edema, 2+ distal pulses.  A/P 60 yo here for post op check - Pathology results reviewed with the patient - Patient is cleared to return to her regular  activities - Patient to return if PMB returns

## 2021-03-24 ENCOUNTER — Other Ambulatory Visit: Payer: Self-pay | Admitting: Family Medicine

## 2021-03-24 DIAGNOSIS — E785 Hyperlipidemia, unspecified: Secondary | ICD-10-CM

## 2021-03-24 DIAGNOSIS — I10 Essential (primary) hypertension: Secondary | ICD-10-CM

## 2021-03-24 DIAGNOSIS — K219 Gastro-esophageal reflux disease without esophagitis: Secondary | ICD-10-CM

## 2021-03-24 MED ORDER — LISINOPRIL 10 MG PO TABS
ORAL_TABLET | Freq: Every day | ORAL | 2 refills | Status: DC
  Filled 2021-03-24: qty 30, 30d supply, fill #0
  Filled 2021-04-26: qty 30, 30d supply, fill #1

## 2021-03-24 MED ORDER — OMEPRAZOLE 40 MG PO CPDR
DELAYED_RELEASE_CAPSULE | ORAL | 1 refills | Status: DC
  Filled 2021-03-24: qty 30, 30d supply, fill #0
  Filled 2021-03-25: qty 60, 30d supply, fill #0
  Filled 2021-04-26: qty 60, 30d supply, fill #1
  Filled 2021-05-19: qty 60, 30d supply, fill #2

## 2021-03-24 NOTE — Telephone Encounter (Signed)
Medication Refill - Medication: atorvastatin (LIPITOR) 20 MG tablet lisinopril (ZESTRIL) 10 MG tablet omeprazole (PRILOSEC) 40 MG capsule     Preferred Pharmacy (with phone number or street name): [facility]  Agent: Please be advised that RX refills may take up to 3 business days. We ask that you follow-up with your pharmacy.

## 2021-03-24 NOTE — Telephone Encounter (Signed)
Requested Prescriptions  Pending Prescriptions Disp Refills  . atorvastatin (LIPITOR) 20 MG tablet 30 tablet 3    Sig: TAKE 1 TABLET (20 MG TOTAL) BY MOUTH DAILY.     Cardiovascular:  Antilipid - Statins Failed - 03/24/2021  4:26 PM      Failed - Total Cholesterol in normal range and within 360 days    Cholesterol, Total  Date Value Ref Range Status  12/13/2017 173 100 - 199 mg/dL Final         Failed - LDL in normal range and within 360 days    LDL Calculated  Date Value Ref Range Status  12/13/2017 82 0 - 99 mg/dL Final         Failed - HDL in normal range and within 360 days    HDL  Date Value Ref Range Status  12/13/2017 45 >39 mg/dL Final         Failed - Triglycerides in normal range and within 360 days    Triglycerides  Date Value Ref Range Status  12/13/2017 230 (H) 0 - 149 mg/dL Final         Passed - Patient is not pregnant      Passed - Valid encounter within last 12 months    Recent Outpatient Visits          1 month ago [place], Charlane Ferretti, MD   3 months ago Cough   [place], [place]   5 months ago Hyperglycemia   [place], Levada Dy M, [place]   10 months ago Chronic thoracic spine pain   [place], [place], NP   1 year ago Epigastric pain   [place], MD      Future Appointments            In 1 month Charlott Rakes, MD [place]           . lisinopril (ZESTRIL) 10 MG tablet 30 tablet 2    Sig: TAKE 1 TABLET (10 MG TOTAL) BY MOUTH DAILY.     Cardiovascular:  ACE Inhibitors Failed - 03/24/2021  4:26 PM      Failed - Cr in normal range and within 180 days    Creatinine  Date Value Ref Range Status  10/08/2013 0.7 0.6 - 1.1 mg/dL Final   Creat  Date Value Ref Range Status  11/23/2016 0.61  0.50 - 1.05 mg/dL Final    Comment:      For patients > or = 60 years of age: The upper reference limit for Creatinine is approximately 13% higher for people identified as African-American.      Creatinine, Ser  Date Value Ref Range Status  02/02/2021 0.40 (L) 0.44 - 1.00 mg/dL Final         Passed - K in normal range and within 180 days    Potassium  Date Value Ref Range Status  02/02/2021 3.8 3.5 - 5.1 mmol/L Final  10/08/2013 3.9 3.5 - 5.1 mEq/L Final         Passed - Patient is not pregnant      Passed - Last BP in normal range    BP Readings from Last 1 Encounters:  02/23/21 127/73         Passed - Valid encounter within last 6 months  Recent Outpatient Visits          1 month ago [place], Charlane Ferretti, MD   3 months ago Cough   [place], [place]   5 months ago Hyperglycemia   [place], Levada Dy M, [place]   10 months ago Chronic thoracic spine pain   [place], [place], NP   1 year ago Epigastric pain   [place], MD      Future Appointments            In 1 month Charlott Rakes, MD [place]           . omeprazole (PRILOSEC) 40 MG capsule 180 capsule 1    Sig: TOME 1 PASTILLA POR VIA ORAL DOS VECES AL DIA A REDUCIR EL ACIDO DEL [place]     Gastroenterology: Proton Pump Inhibitors Passed - 03/24/2021  4:26 PM      Passed - Valid encounter within last 12 months    Recent Outpatient Visits          1 month ago [place], Charlane Ferretti, MD   3 months ago Cough   [place], [place]   5 months ago Hyperglycemia   [place], Levada Dy M, [place]   10 months ago Chronic thoracic  spine pain   [place], [place], NP   1 year ago Epigastric pain   [place], MD      Future Appointments            In 1 month Charlott Rakes, MD [place]

## 2021-03-24 NOTE — Telephone Encounter (Signed)
Requested medications are due for refill today.  yes  Requested medications are on the active medications list.  yes  Last refill. 10/05/2020  Future visit scheduled.   yes  Notes to clinic.  Prescription is expired.

## 2021-03-25 ENCOUNTER — Other Ambulatory Visit: Payer: Self-pay

## 2021-03-25 MED ORDER — ATORVASTATIN CALCIUM 20 MG PO TABS
ORAL_TABLET | Freq: Every day | ORAL | 3 refills | Status: DC
  Filled 2021-03-25: qty 30, 30d supply, fill #0
  Filled 2021-04-26: qty 30, 30d supply, fill #1

## 2021-03-26 ENCOUNTER — Other Ambulatory Visit: Payer: Self-pay

## 2021-04-26 ENCOUNTER — Ambulatory Visit: Payer: Self-pay | Attending: Family Medicine

## 2021-04-26 ENCOUNTER — Other Ambulatory Visit: Payer: Self-pay

## 2021-04-27 ENCOUNTER — Other Ambulatory Visit: Payer: Self-pay

## 2021-05-18 ENCOUNTER — Ambulatory Visit: Payer: Self-pay | Attending: Family Medicine | Admitting: Family Medicine

## 2021-05-18 ENCOUNTER — Other Ambulatory Visit: Payer: Self-pay

## 2021-05-18 ENCOUNTER — Encounter: Payer: Self-pay | Admitting: Family Medicine

## 2021-05-18 ENCOUNTER — Telehealth: Payer: Self-pay | Admitting: Family Medicine

## 2021-05-18 DIAGNOSIS — E785 Hyperlipidemia, unspecified: Secondary | ICD-10-CM

## 2021-05-18 DIAGNOSIS — G8929 Other chronic pain: Secondary | ICD-10-CM

## 2021-05-18 DIAGNOSIS — I10 Essential (primary) hypertension: Secondary | ICD-10-CM

## 2021-05-18 DIAGNOSIS — M546 Pain in thoracic spine: Secondary | ICD-10-CM

## 2021-05-18 DIAGNOSIS — N950 Postmenopausal bleeding: Secondary | ICD-10-CM

## 2021-05-18 DIAGNOSIS — R202 Paresthesia of skin: Secondary | ICD-10-CM

## 2021-05-18 DIAGNOSIS — I700 Atherosclerosis of aorta: Secondary | ICD-10-CM

## 2021-05-18 DIAGNOSIS — K760 Fatty (change of) liver, not elsewhere classified: Secondary | ICD-10-CM

## 2021-05-18 MED ORDER — MELOXICAM 7.5 MG PO TABS
ORAL_TABLET | Freq: Every day | ORAL | 1 refills | Status: AC
Start: 2021-05-18 — End: 2022-05-18
  Filled 2021-05-18: qty 90, 90d supply, fill #0
  Filled 2021-09-02: qty 90, 90d supply, fill #1

## 2021-05-18 MED ORDER — ATORVASTATIN CALCIUM 20 MG PO TABS
ORAL_TABLET | Freq: Every day | ORAL | 1 refills | Status: DC
  Filled 2021-05-18: qty 90, 90d supply, fill #0
  Filled 2021-09-02: qty 90, 90d supply, fill #1

## 2021-05-18 MED ORDER — GABAPENTIN 100 MG PO CAPS
ORAL_CAPSULE | Freq: Two times a day (BID) | ORAL | 0 refills | Status: DC
  Filled 2021-05-18: qty 180, 90d supply, fill #0

## 2021-05-18 MED ORDER — LISINOPRIL 10 MG PO TABS
ORAL_TABLET | Freq: Every day | ORAL | 1 refills | Status: DC
  Filled 2021-05-18: qty 90, 90d supply, fill #0
  Filled 2021-09-02: qty 90, 90d supply, fill #1

## 2021-05-18 NOTE — Telephone Encounter (Signed)
Rx #: [id number]  omeprazole (PRILOSEC) 40 MG capsule [[id number]]  Was the medication that the pt needs- all others were filled.  @ [place]

## 2021-05-18 NOTE — Progress Notes (Signed)
Virtual Visit via Telephone Note  I connected with Susan Lopez, on 05/18/2021 at 4:15 PM by telephone due to the COVID-19 pandemic and verified that I am speaking with the correct person using two identifiers.   Consent: I discussed the limitations, risks, security and privacy concerns of performing an evaluation and management service by telephone and the availability of in person appointments. I also discussed with the patient that there may be a patient responsible charge related to this service. The patient expressed understanding and agreed to proceed.   Location of Patient: Home  Location of Provider: Clinic   Persons participating in Telemedicine visit: Sheran Fava [place] Interpreter Dr. Margarita Rana     History of Present Illness: 60 year old female with history of hypertension, hyperlipidemia who presents today for chronic disease management   Complains of L side pain 'around her ovaries'. Pain is associated with bleeding She underwent dilatation and curettage/hysteroscopy and myosure polypectomy in 01/2021.  1 year ago she had a similar procedure. She is yet to notify her GYN about this.  CT abdomen and pelvis from 01/2021 revealed: IMPRESSION: 1. No acute findings in the abdomen or pelvis. 2. Hepatic steatosis with stable hepatic hemangioma. 3. Small intermediate density lesion only minimally changed since 2015 arising from the lower pole of the RIGHT kidney, may represent a cyst having undergone hemorrhagic transformation. Consider 6-12 month follow-up abdominal MRI with and without contrast for complete assessment. 4. Aortic atherosclerosis.     Compliant with her antihypertensive and statin and denies adverse effects from her medications. BP has been [phone] systolic She is due for an abdominal MRI to follow-up on right kidney cyst in 07/2021. She has numbness in her hands and has been on gabapentin but also noticed numbness in her face for the  last 1 month.  Denies weakness of any body parts. A1c was 5.8 in 09/2020. Past Medical History:  Diagnosis Date   Anxiety    Arthritis    Chronic gastritis    Chronic thoracic spine pain    Depression    GERD (gastroesophageal reflux disease)    Hemorrhoids, external 10-21-11   occ. bothersome   History of colon polyps    Hyperlipidemia    Hypertension    Iron deficiency anemia    OSA on CPAP    study in epic 08-29-2017 severe osa (AHI 32.5), per pt daughter uses nightly   PMB (postmenopausal bleeding)    PONV (postoperative nausea and vomiting)    Pre-diabetes    Allergies  Allergen Reactions   Hydrocodone Nausea And Vomiting   Tramadol Nausea Only   Oxycodone Rash    Current Outpatient Medications on File Prior to Visit  Medication Sig Dispense Refill   atorvastatin (LIPITOR) 20 MG tablet TAKE 1 TABLET (20 MG TOTAL) BY MOUTH DAILY. 30 tablet 3   clotrimazole-betamethasone (LOTRISONE) cream Apply 1 application topically 2 (two) times daily. As needed for skin irritation (Patient not taking: No sig reported) 30 g 2   gabapentin (NEURONTIN) 100 MG capsule TAKE 1 CAPSULE BY MOUTH 2 TIMES DAILY. 60 capsule 3   hydrocortisone-pramoxine (ANALPRAM HC) 2.5-1 % rectal cream Place 1 application rectally 3 (three) times daily. 30 g 2   ibuprofen (ADVIL) 600 MG tablet Take 1 tablet (600 mg total) by mouth every 6 (six) hours as needed. 60 tablet 3   lisinopril (ZESTRIL) 10 MG tablet TAKE 1 TABLET (10 MG TOTAL) BY MOUTH DAILY. 30 tablet 2   meloxicam (MOBIC) 7.5 MG tablet TAKE  1 TABLET (7.5 MG TOTAL) BY MOUTH DAILY. 30 tablet 3   Multiple Vitamin (MULTIVITAMIN WITH MINERALS) TABS tablet Take 1 tablet by mouth daily.     omeprazole (PRILOSEC) 40 MG capsule TOME 1 PASTILLA POR VIA ORAL DOS VECES AL DIA A REDUCIR EL ACIDO DEL ESTOMAGO 90 capsule 1   oxyCODONE-acetaminophen (PERCOCET/ROXICET) 5-325 MG tablet Take 1 tablet by mouth every 4 (four) hours as needed. (Patient not taking: No sig  reported) 15 tablet 0   polyethylene glycol powder (GLYCOLAX/MIRALAX) 17 GM/SCOOP powder Take 17 g by mouth daily. As needed for constipation relief 3350 g 2   Probiotic Product (PROBIOTIC PO) Take by mouth daily.     psyllium (METAMUCIL) 58.6 % powder Take 1 packet by mouth as needed. 2 x weekly     UNABLE TO FIND Calcium, magnesium & zinc (all in 1), once daily     No current facility-administered medications on file prior to visit.    ROS: See HPI  Observations/Objective: Awake, alert, and 2x3 Not in acute distress  CMP Latest Ref Rng & Units 02/02/2021 10/05/2020 12/06/2019  Glucose 70 - 99 mg/dL 109(H) 132(H) 158(H)  BUN 6 - 20 mg/dL 10 10 9   Creatinine 0.44 - 1.00 mg/dL 0.40(L) 0.71 0.65  Sodium 135 - 145 mmol/L 143 142 141  Potassium 3.5 - 5.1 mmol/L 3.8 3.9 4.0  Chloride 98 - 111 mmol/L 104 109(H) 105  CO2 20 - 29 mmol/L - 18(L) 21  Calcium 8.7 - 10.2 mg/dL - 8.9 9.3  Total Protein 6.0 - 8.5 g/dL - 6.6 7.0  Total Bilirubin 0.0 - 1.2 mg/dL - 0.3 0.4  Alkaline Phos 44 - 121 IU/L - 110 135(H)  AST 0 - 40 IU/L - 13 23  ALT 0 - 32 IU/L - 22 53(H)    Lipid Panel     Component Value Date/Time   CHOL 173 12/13/2017 1150   TRIG 230 (H) 12/13/2017 1150   HDL 45 12/13/2017 1150   CHOLHDL 3.8 12/13/2017 1150   CHOLHDL 5.3 (H) 11/23/2016 0952   VLDL 61 (H) 11/23/2016 0952   LDLCALC 82 12/13/2017 1150   LABVLDL 46 (H) 12/13/2017 1150    Lab Results  Component Value Date   HGBA1C 5.8 (A) 10/05/2020    Assessment and Plan: 1. Essential hypertension Controlled Counseled on blood pressure goal of less than 130/80, low-sodium, DASH diet, medication compliance, 150 minutes of moderate intensity exercise per week. Discussed medication compliance, adverse effects. - lisinopril (ZESTRIL) 10 MG tablet; TAKE 1 TABLET (10 MG TOTAL) BY MOUTH DAILY.  Dispense: 90 tablet; Refill: 1  2. Dyslipidemia Controlled - atorvastatin (LIPITOR) 20 MG tablet; TAKE 1 TABLET (20 MG TOTAL) BY MOUTH  DAILY.  Dispense: 90 tablet; Refill: 1  3. Postmenopausal bleeding Status post D&C 3 months ago Advised to contact GYN regarding ongoing bleeding  4. Hepatic steatosis Counseled on low-cholesterol diet, compliance with statin  5. Atherosclerosis of aorta ([place]) See #4 above  6. Chronic thoracic spine pain Stable - gabapentin (NEURONTIN) 100 MG capsule; TAKE 1 CAPSULE BY MOUTH 2 TIMES DAILY.  Dispense: 180 capsule; Refill: 0 - meloxicam (MOBIC) 7.5 MG tablet; TAKE 1 TABLET (7.5 MG TOTAL) BY MOUTH DAILY.  Dispense: 90 tablet; Refill: 1  7. Paresthesia Unknown etiology She does have a history of prediabetes We will screen for diabetes mellitus - Vitamin B12; Future - Hemoglobin A1c; Future   Follow Up Instructions: 3 months   I discussed the assessment and treatment plan with  the patient. The patient was provided an opportunity to ask questions and all were answered. The patient agreed with the plan and demonstrated an understanding of the instructions.   The patient was advised to call back or seek an in-person evaluation if the symptoms worsen or if the condition fails to improve as anticipated.     I provided 17 minutes total of non-face-to-face time during this encounter.   Charlott Rakes, MD, FAAFP. [facility] [place] [facility], [place]   05/18/2021, 4:15 PM

## 2021-05-19 ENCOUNTER — Other Ambulatory Visit: Payer: Self-pay

## 2021-05-19 NOTE — Telephone Encounter (Signed)
Please call patient and inform patient that medication has refills and ready for pick up in the pharmacy.

## 2021-05-24 ENCOUNTER — Other Ambulatory Visit: Payer: Self-pay

## 2021-05-24 ENCOUNTER — Ambulatory Visit: Payer: Self-pay | Attending: Family Medicine

## 2021-05-24 DIAGNOSIS — R202 Paresthesia of skin: Secondary | ICD-10-CM

## 2021-05-25 LAB — HEMOGLOBIN A1C
Est. average glucose Bld gHb Est-mCnc: 128 mg/dL
Hgb A1c MFr Bld: 6.1 % — ABNORMAL HIGH (ref 4.8–5.6)

## 2021-05-25 LAB — VITAMIN B12: Vitamin B-12: 816 pg/mL (ref 232–1245)

## 2021-05-26 ENCOUNTER — Other Ambulatory Visit: Payer: Self-pay

## 2021-06-25 ENCOUNTER — Telehealth: Payer: Self-pay

## 2021-06-25 NOTE — Telephone Encounter (Signed)
-----   Message from Charlott Rakes, MD sent at 05/25/2021  8:44 AM EDT ----- Please inform the patient that labs are normal. Thank you.

## 2021-06-25 NOTE — Telephone Encounter (Signed)
Patient name and DOB has been verified Patient was informed of lab results. Patient had no questions.  

## 2021-08-13 ENCOUNTER — Other Ambulatory Visit: Payer: Self-pay | Admitting: Family Medicine

## 2021-08-13 ENCOUNTER — Other Ambulatory Visit: Payer: Self-pay

## 2021-08-13 DIAGNOSIS — K219 Gastro-esophageal reflux disease without esophagitis: Secondary | ICD-10-CM

## 2021-08-13 MED ORDER — OMEPRAZOLE 40 MG PO CPDR
DELAYED_RELEASE_CAPSULE | ORAL | 1 refills | Status: DC
  Filled 2021-08-13: qty 60, 30d supply, fill #0
  Filled 2021-12-14: qty 60, 30d supply, fill #1
  Filled 2021-12-15: qty 120, 60d supply, fill #0

## 2021-08-13 NOTE — Telephone Encounter (Signed)
Requested Prescriptions  Pending Prescriptions Disp Refills  . omeprazole (PRILOSEC) 40 MG capsule 90 capsule 1    Sig: TOME 1 PASTILLA POR VIA ORAL DOS VECES AL DIA A REDUCIR EL ACIDO DEL [facility] [facility]     Gastroenterology: Proton Pump Inhibitors Passed - 08/13/2021 11:39 AM      Passed - Valid encounter within last 12 months    Recent Outpatient Visits          2 months ago Essential hypertension   [place], Charlane Ferretti, MD   5 months ago [place], Charlane Ferretti, MD   8 months ago Cough   [place], [place]   10 months ago Hyperglycemia   [place], [place]   1 year ago Chronic thoracic spine pain   [place], [place], NP      Future Appointments            In 4 days Mayers, Loraine Grip, PA-C [place]

## 2021-08-17 ENCOUNTER — Encounter: Payer: Self-pay | Admitting: Physician Assistant

## 2021-08-17 ENCOUNTER — Ambulatory Visit: Payer: Self-pay | Attending: Physician Assistant | Admitting: Physician Assistant

## 2021-08-17 ENCOUNTER — Other Ambulatory Visit: Payer: Self-pay

## 2021-08-17 VITALS — BP 122/78 | HR 83 | Temp 98.7°F | Resp 18 | Ht 64.0 in | Wt 172.0 lb

## 2021-08-17 DIAGNOSIS — G8929 Other chronic pain: Secondary | ICD-10-CM

## 2021-08-17 DIAGNOSIS — M546 Pain in thoracic spine: Secondary | ICD-10-CM

## 2021-08-17 DIAGNOSIS — N281 Cyst of kidney, acquired: Secondary | ICD-10-CM

## 2021-08-17 NOTE — Progress Notes (Signed)
Established Patient Office Visit  Subjective:  Patient ID: Susan Lopez, female    DOB: Apr 24, 1961  Age: 60 y.o. MRN: [id number]  CC:  Chief Complaint  Patient presents with   Referral    Urology    HPI  States that she has been having middle and right sided lower back pain for several years.  States that it is dull and constant.  States that she has tried OTC pain medications with a little relief.  States that she will occasionally have dysuria and odor to her urine on and off over the past 4 years. States that she is not currently having any urinary issues.  Denies trauma or injury to her back, denies radiation, numbness or tingling.   Due to language barrier, an interpreter was present during the history-taking and subsequent discussion (and for part of the physical exam) with this patient.   Past Medical History:  Diagnosis Date   Anxiety    Arthritis    Chronic gastritis    Chronic thoracic spine pain    Depression    GERD (gastroesophageal reflux disease)    Hemorrhoids, external 10-21-11   occ. bothersome   History of colon polyps    Hyperlipidemia    Hypertension    Iron deficiency anemia    OSA on CPAP    study in epic 08-29-2017 severe osa (AHI 32.5), per pt daughter uses nightly   PMB (postmenopausal bleeding)    PONV (postoperative nausea and vomiting)    Pre-diabetes     Past Surgical History:  Procedure Laterality Date   [place]  10/27/2011   Procedure: LAPAROSCOPIC CHOLECYSTECTOMY WITH INTRAOPERATIVE CHOLANGIOGRAM;  Surgeon: Adin Hector, MD;  Location: [facility];  Service: General;  Laterality: N/A;  Laparoscopic Chole w/ IOC Single Site   COLONOSCOPY WITH PROPOFOL  last one 05-09-2018   DILATATION & CURETTAGE/HYSTEROSCOPY WITH MYOSURE N/A 03/11/2020   Procedure: DILATATION AND CURETTAGE /HYSTEROSCOPY WITH MYOSURE;  Surgeon: Mora Bellman, MD;  Location: [place];  Service:  Gynecology;  Laterality: N/A;   DILATATION & CURETTAGE/HYSTEROSCOPY WITH MYOSURE N/A 02/02/2021   Procedure: DILATATION & CURETTAGE/HYSTEROSCOPY WITH MYOSURE POLYPECTOMY;  Surgeon: Mora Bellman, MD;  Location: [place];  Service: Gynecology;  Laterality: N/A;   ESOPHAGOGASTRODUODENOSCOPY (EGD) WITH PROPOFOL  last one 12-30-2019  dr Loletha Carrow   EYE SURGERY  10/21/2011   bil. for tissue growth-laser surgery   HARDWARE REMOVAL Left 03/16/2016   Procedure: LEFT ANKLE HARDWARE REMOVAL;  Surgeon: Leandrew Koyanagi, MD;  Location: [place];  Service: Orthopedics;  Laterality: Left;   MM BREAST STEREO BIOPSY LEFT ([facility] HX) Left 2015   ORIF ANKLE FRACTURE Left 11/13/2015   Procedure: OPEN REDUCTION INTERNAL FIXATION (ORIF) LEFT BIMALLEOLAR ANKLE FRACTURE;  Surgeon: Leandrew Koyanagi, MD;  Location: [place];  Service: Orthopedics;  Laterality: Left;    Family History  Problem Relation Age of Onset   Hypertension Mother    Cancer Mother    Hypertension Father    Arthritis Brother    Hypertension Brother    Intellectual disability Paternal Aunt    Colon cancer Neg Hx     Social History   Socioeconomic History   Marital status: Single    Spouse name: Not on file   Number of children: 2   Years of education: Not on file   Highest education level: 4th grade  Occupational History   Occupation: unemployed  Tobacco Use  Smoking status: Former    Packs/day: 0.25    Years: 10.00    Pack years: 2.50    Types: Cigarettes    Quit date: 2008    Years since quitting: 14.8   Smokeless tobacco: Never  Vaping Use   Vaping Use: Never used  Substance and Sexual Activity   Alcohol use: No   Drug use: Never   Sexual activity: Not Currently    Birth control/protection: Post-menopausal  Other Topics Concern   Not on file  Social History Narrative   Not on file   Social Determinants of Health   Financial Resource Strain: Not on file  Food Insecurity: Not on file   Transportation Needs: Unmet Transportation Needs   Lack of Transportation (Medical): Yes   Lack of Transportation (Non-Medical): Yes  Physical Activity: Not on file  Stress: Not on file  Social Connections: Not on file  Intimate Partner Violence: Not on file    Outpatient Medications Prior to Visit  Medication Sig Dispense Refill   atorvastatin (LIPITOR) 20 MG tablet TAKE 1 TABLET (20 MG TOTAL) BY MOUTH DAILY. 90 tablet 1   clotrimazole-betamethasone (LOTRISONE) cream Apply 1 application topically 2 (two) times daily. As needed for skin irritation (Patient not taking: No sig reported) 30 g 2   gabapentin (NEURONTIN) 100 MG capsule TAKE 1 CAPSULE BY MOUTH 2 TIMES DAILY. 180 capsule 0   hydrocortisone-pramoxine (ANALPRAM HC) 2.5-1 % rectal cream Place 1 application rectally 3 (three) times daily. 30 g 2   lisinopril (ZESTRIL) 10 MG tablet TAKE 1 TABLET (10 MG TOTAL) BY MOUTH DAILY. 90 tablet 1   meloxicam (MOBIC) 7.5 MG tablet TAKE 1 TABLET (7.5 MG TOTAL) BY MOUTH DAILY. 90 tablet 1   Multiple Vitamin (MULTIVITAMIN WITH MINERALS) TABS tablet Take 1 tablet by mouth daily.     omeprazole (PRILOSEC) 40 MG capsule TOME 1 PASTILLA POR VIA ORAL DOS VECES AL DIA A REDUCIR EL ACIDO DEL [place] 90 capsule 1   oxyCODONE-acetaminophen (PERCOCET/ROXICET) 5-325 MG tablet Take 1 tablet by mouth every 4 (four) hours as needed. (Patient not taking: No sig reported) 15 tablet 0   polyethylene glycol powder (GLYCOLAX/MIRALAX) 17 GM/SCOOP powder Take 17 g by mouth daily. As needed for constipation relief 3350 g 2   Probiotic Product (PROBIOTIC PO) Take by mouth daily.     psyllium (METAMUCIL) 58.6 % powder Take 1 packet by mouth as needed. 2 x weekly     UNABLE TO FIND Calcium, magnesium & zinc (all in 1), once daily     No facility-administered medications prior to visit.    Allergies  Allergen Reactions   Hydrocodone Nausea And Vomiting   Tramadol Nausea Only   Oxycodone Rash    ROS Review of  Systems  Constitutional:  Negative for chills and fever.  HENT: Negative.    Eyes: Negative.   Respiratory:  Negative for shortness of breath.   Cardiovascular:  Negative for chest pain.  Gastrointestinal: Negative.   Endocrine: Negative.   Genitourinary:  Negative for dysuria, hematuria and vaginal discharge.  Musculoskeletal:  Positive for back pain.  Skin: Negative.   Allergic/Immunologic: Negative.   Neurological: Negative.   Hematological: Negative.   Psychiatric/Behavioral: Negative.       Objective:    Physical Exam Vitals and nursing note reviewed.  Constitutional:      Appearance: Normal appearance.  HENT:     Head: Normocephalic and atraumatic.     Right Ear: External ear normal.  Left Ear: External ear normal.     Nose: Nose normal.     Mouth/Throat:     Mouth: Mucous membranes are moist.     Pharynx: Oropharynx is clear.  Eyes:     Extraocular Movements: Extraocular movements intact.     Conjunctiva/sclera: Conjunctivae normal.     Pupils: Pupils are equal, round, and reactive to light.  Cardiovascular:     Rate and Rhythm: Normal rate and regular rhythm.     Pulses: Normal pulses.     Heart sounds: Normal heart sounds.  Pulmonary:     Effort: Pulmonary effort is normal.     Breath sounds: Normal breath sounds.  Abdominal:     General: Abdomen is flat.     Tenderness: There is no abdominal tenderness. There is no right CVA tenderness or left CVA tenderness.  Musculoskeletal:     Cervical back: Normal, normal range of motion and neck supple.     Thoracic back: No tenderness. Normal range of motion.     Lumbar back: Tenderness present. Normal range of motion.  Skin:    General: Skin is warm and dry.  Neurological:     General: No focal deficit present.     Mental Status: She is alert and oriented to person, place, and time.  Psychiatric:        Mood and Affect: Mood normal.        Behavior: Behavior normal.        Thought Content: Thought content  normal.        Judgment: Judgment normal.    BP 122/78 (BP Location: Left Arm, Patient Position: Sitting, Cuff Size: Large)   Pulse 83   Temp 98.7 F (37.1 C) (Oral)   Resp 18   Ht 5\' 4"  (1.626 m)   Wt 172 lb (78 kg)   LMP 10/08/2015 (Exact Date)   SpO2 98%   BMI 29.52 kg/m  Wt Readings from Last 3 Encounters:  08/17/21 172 lb (78 kg)  02/23/21 175 lb (79.4 kg)  02/16/21 176 lb (79.8 kg)     Health Maintenance Due  Topic Date Due   COVID-19 Vaccine (1) Never done   HIV Screening  Never done   TETANUS/TDAP  Never done   Zoster Vaccines- Shingrix (1 of 2) Never done   INFLUENZA VACCINE  Never done    There are no preventive care reminders to display for this patient.  Lab Results  Component Value Date   TSH 1.270 10/05/2020   Lab Results  Component Value Date   WBC 8.7 02/16/2021   HGB 14.8 02/16/2021   HCT 44.2 02/16/2021   MCV 93 02/16/2021   PLT 291 02/16/2021   Lab Results  Component Value Date   NA 143 02/02/2021   K 3.8 02/02/2021   CHLORIDE 108 10/08/2013   CO2 18 (L) 10/05/2020   GLUCOSE 109 (H) 02/02/2021   BUN 10 02/02/2021   CREATININE 0.40 (L) 02/02/2021   BILITOT 0.3 10/05/2020   ALKPHOS 110 10/05/2020   AST 13 10/05/2020   ALT 22 10/05/2020   PROT 6.6 10/05/2020   ALBUMIN 4.1 10/05/2020   CALCIUM 8.9 10/05/2020   ANIONGAP 8 11/13/2015   GFR 118.50 03/07/2018   Lab Results  Component Value Date   CHOL 173 12/13/2017   Lab Results  Component Value Date   HDL 45 12/13/2017   Lab Results  Component Value Date   LDLCALC 82 12/13/2017   Lab Results  Component Value Date  TRIG 230 (H) 12/13/2017   Lab Results  Component Value Date   CHOLHDL 3.8 12/13/2017   Lab Results  Component Value Date   HGBA1C 6.1 (H) 05/24/2021      Assessment & Plan:   Problem List Items Addressed This Visit   None Visit Diagnoses     Cyst of right kidney    -  Primary   Acquired cyst of kidney       Relevant Orders   MR Abdomen W Wo  Contrast       No orders of the defined types were placed in this encounter. 1. Cyst of right kidney    IMPRESSION: 1. No acute findings in the abdomen or pelvis. 2. Hepatic steatosis with stable hepatic hemangioma. 3. Small intermediate density lesion only minimally changed since 2015 arising from the lower pole of the RIGHT kidney, may represent a cyst having undergone hemorrhagic transformation. Consider 6-12 month follow-up abdominal MRI with and without contrast for complete assessment. 4. Aortic atherosclerosis.     Electronically Signed   By: Zetta Bills M.D.   On: 02/23/2021 11:03     - MR Abdomen W Wo Contrast; Future  2. Chronic thoracic spine pain  Patient education given on supportive care.  Red flags for prompt evaluation.   I have reviewed the patient's medical history (PMH, PSH, Social History, Family History, Medications, and allergies) , and have been updated if relevant. I spent 20 minutes reviewing chart and  face to face time with patient.    Follow-up: Return if symptoms worsen or fail to improve.    Loraine Grip Mayers, PA-C

## 2021-08-17 NOTE — Progress Notes (Signed)
Patient has eaten and taken medication. Patient reports pain in the in the back due to Hx of kidney stones. Patient request referral to urology. Patient also request refills.

## 2021-08-17 NOTE — Patient Instructions (Signed)
We will call you with the results of your MRI when they are available.  Kennieth Rad, PA-C Physician Assistant [facility] [email or web address]   [facility] Magnetic Resonance Imaging La resonancia magntica (RM) es una prueba indolora que genera imgenes de los rganos y tejidos en el interior del cuerpo sin usar rayos X. Durante una RM, imanes potentes y [place] de radio se [place] para Consulting civil engineer. Las imgenes de una RM pueden brindar ms detalles sobre una afeccin mdica que las radiografas, las exploraciones por tomografa computarizada (TC) y las ecografas. Para una RM estndar, deber Mady Haagensen en una camilla que se desliza dentro de un tnel. En una RM abierta, el tnel estar [employer]. En algunos casos, se puede inyectar un tinte (material de contraste) en el torrente sanguneo para que las imgenes de la RM sean incluso ms claras. Informe al mdico acerca de lo siguiente: Cualquier alergia que tenga. Todos los [employer] [place], incluidos vitaminas, hierbas, gotas oftlmicas, cremas y medicamentos de venta libre. Cirugas a las que se haya sometido. Cualquier afeccin mdica que tenga. Cualquier pieza de metal que tenga en el organismo. Los imanes usados en una RM puede hacer que los objetos metlicos del cuerpo se [place]. El metal tambin puede dificultar la obtencin de imgenes ntidas. Los objetos que pueden contener metal incluyen: Cualquier reemplazo de Insurance claims handler (prtesis), como una rodilla o cadera artificial. Un desfibrilador implantado, marcapasos o neuroestimulador. Un implante metlico de odo (implante coclear). Una vlvula cardaca artificial. Un objeto metlico en el ojo. Esquirlas de metal. Fragmentos de bala. Un puerto para la administracin de insulina o quimioterapia. Tatuajes. Algunas de las tintas ms oscuras pueden causar problemas con [place]. Si est  usando un implante anticonceptivo como un dispositivo intrauterino (DIU). Si est embarazada o podra estarlo, o si est amamantando. Todo miedo a los espacios cerrados (claustrofobia). Si esto es un problema, generalmente puede controlarse con medicamentos administrados antes de la RM. Cules son los riesgos? Por lo general, se trata de un estudio seguro. Sin embargo, pueden presentarse problemas, por ejemplo: Si tiene un metal en el cuerpo cerca de la zona que se est examinando, puede ser difcil obtener imgenes de alta calidad. Si est embarazada, debe evitar los estudios por RM, [employer] primeros tres meses de [place]. Ardelia Mems RM puede afectar a un beb en gestacin. Si se [place] un tinte: Es posible que tenga que dejar de [employer] tinte se elimine del cuerpo de forma natural, si corresponde. Tambin existe el riesgo de una reaccin alrgica al tinte. Puede usar medicamentos para Comptroller reaccin o para tratarla si tiene sntomas de alergia. El tinte puede causar daos en los riones. Beber mucha agua antes y despus del procedimiento puede ayudar a Orthoptist problema. Qu ocurre antes del procedimiento? Se le pedir que se quite todos los objetos de metal, incluidos: Un reloj, joyas (incluidas joyas en perforaciones) y otros objetos de metal. Audfonos. Dentaduras postizas. Sostn con aro. [place]. Algunos maquillajes contienen pequeas cantidades de metal. Los aparatos de ortodoncia y las emplomaduras normalmente no son un problema. Si est amamantando, pregntele al mdico si necesita extraerse leche antes de la prueba. Es posible que tenga que dejar de Economist temporalmente si se va a usar un tinte. Qu ocurre durante el procedimiento?  Pueden darle auriculares o audfonos para que escuche msica. La mquina de RM puede ser ruidosa. Deber Mady Haagensen boca arriba en posicin horizontal en una camilla larga. Si se va  a usar un tinte, Firefighter un tubo  (catter) intravenoso en una de las venas. El tinte se Tour manager en la va endovenosa y se desplazar a travs del torrente sanguneo. La camilla se deslizar hacia dentro de un tnel que tiene imanes en su interior. Una vez dentro del tnel, podr seguir hablando con el mdico. Le pedirn que permanezca muy quieto mientras se toman las imgenes. El mdico le dir cundo puede moverse. Tal vez deba esperar unos minutos hasta tanto se compruebe que las imgenes tomadas durante la prueba son claras. Cuando se hayan generado todas las imgenes, la camilla se deslizar hacia afuera del tnel. El procedimiento puede durar de 30 minutos a ms de [place]. El procedimiento puede variar segn el mdico y el hospital. Qu puedo esperar despus del procedimiento? Es posible que lo trasladen a un rea de recuperacin si se utilizaron sedantes. Le controlarn la presin arterial, la frecuencia cardaca, la frecuencia respiratoria y Retail buyer de oxgeno en la sangre hasta que le den el alta del hospital o la clnica. Si se utiliz un tinte: Se eliminar del cuerpo a travs de la orina en el trmino de Optician, dispensing. Es posible que le indiquen que beba gran cantidad de lquido para ayudar a Tour manager tinte del cuerpo. No amamante a un nio hasta que el mdico le indique que es [place]. Siga estas instrucciones en su casa: Es posible que pueda retomar sus actividades normales de inmediato o como se lo indique el mdico. Es su responsabilidad retirar los [place] de la prueba. Consulte al mdico o pregunte en el departamento donde se realiza la prueba cundo estarn [employer]. Cumpla con todas las visitas de seguimiento. Esto es importante. Hable con su mdico sobre lo que significan los [place] de [place]. Resumen La Health visitor (RM) es un estudio de diagnstico por imgenes indoloro que genera imgenes detalladas del interior del organismo sin usar rayos X. Poderosos imanes y [place] de radio  se combinan para generar imgenes muy detalladas y ntidas. En algunos casos, se puede inyectar un tinte (material de contraste) en el cuerpo para que las imgenes de la RM sean incluso ms claras. Antes de la RM, asegrese de informar al mdico sobre cualquier objeto metlico que pueda tener en el cuerpo. Hable con su mdico sobre lo que significan los [place] de [place]. Esta informacin no tiene Marine scientist el consejo del mdico. Asegrese de hacerle al mdico cualquier pregunta que tenga. Document Revised: 04/25/2020 Document Reviewed: 04/25/2020 Elsevier Patient Education  [place].

## 2021-08-19 ENCOUNTER — Ambulatory Visit (HOSPITAL_COMMUNITY)
Admission: RE | Admit: 2021-08-19 | Discharge: 2021-08-19 | Disposition: A | Discharge status: Home / Self Care | Payer: No Typology Code available for payment source | Source: Ambulatory Visit | Attending: Physician Assistant | Admitting: Physician Assistant

## 2021-08-19 ENCOUNTER — Other Ambulatory Visit: Payer: Self-pay

## 2021-08-19 DIAGNOSIS — N281 Cyst of kidney, acquired: Secondary | ICD-10-CM | POA: Insufficient documentation

## 2021-08-19 MED ORDER — GADOBUTROL 1 MMOL/ML IV SOLN
8.0000 mL | Freq: Once | INTRAVENOUS | Status: AC | PRN
  Administered 2021-08-19: 8 mL via INTRAVENOUS

## 2021-08-31 ENCOUNTER — Telehealth: Payer: Self-pay | Admitting: *Deleted

## 2021-08-31 NOTE — Telephone Encounter (Signed)
Medical Assistant used [employer] to contact patient.  Interpreter Name: Olam Idler Interpreter #: [phone] Patient verified DOB Patient is aware of MRI showing a benign cyst that does not need further evaluation. Patient does need to follow up on Fatty liver. There were no other abnormalities to explain back pain.

## 2021-08-31 NOTE — Telephone Encounter (Signed)
-----   Message from Kennieth Rad, [place] sent at 08/23/2021  1:14 PM EDT ----- Please call patient and let her know that her MRI did show a benign cyst in her kidney that does not require any further intervention. She does need to follow up to discuss the finding of fatty liver, but otherwise it did not show any reason for her back pain.

## 2021-09-02 ENCOUNTER — Other Ambulatory Visit: Payer: Self-pay

## 2021-09-02 ENCOUNTER — Other Ambulatory Visit: Payer: Self-pay | Admitting: Family Medicine

## 2021-09-02 DIAGNOSIS — G8929 Other chronic pain: Secondary | ICD-10-CM

## 2021-09-02 MED ORDER — GABAPENTIN 100 MG PO CAPS
ORAL_CAPSULE | Freq: Two times a day (BID) | ORAL | 0 refills | Status: DC
  Filled 2021-09-02: qty 90, 45d supply, fill #0

## 2021-09-02 NOTE — Telephone Encounter (Signed)
Requested Prescriptions  Pending Prescriptions Disp Refills  . gabapentin (NEURONTIN) 100 MG capsule 90 capsule 0    Sig: TAKE 1 CAPSULE BY MOUTH 2 TIMES DAILY.     Neurology: Anticonvulsants - gabapentin Passed - 09/02/2021 11:11 AM      Passed - Valid encounter within last 12 months    Recent Outpatient Visits          2 weeks ago Cyst of right kidney   [place], PA-C   3 months ago Essential hypertension   [place], [place], MD   6 months ago [place], [place], MD   8 months ago Cough   [place], Levada Dy M, [place]   11 months ago Hyperglycemia   [place], [place], [place]

## 2021-09-06 ENCOUNTER — Other Ambulatory Visit: Payer: Self-pay

## 2021-11-15 ENCOUNTER — Telehealth: Payer: Self-pay | Admitting: Family Medicine

## 2021-11-15 ENCOUNTER — Ambulatory Visit: Payer: Self-pay | Admitting: *Deleted

## 2021-11-15 NOTE — Telephone Encounter (Signed)
°  Chief Complaint: left lower abd pain on and off for a year but worse over the past month. Symptoms: mild vaginal bleeding when having the abd pain Frequency: On and off for a year but more painful over the past month Pertinent Negatives: Patient denies N/A Disposition: [] ED /[] Urgent Care (no appt availability in office) / [x] Appointment(In office/virtual)/ []  [place] [facility]/ [] Home Care/ [] Refused Recommended Disposition /[] [place] [facility]/ []  Follow-up with PCP Additional Notes: I got her an appt with Dr. Margarita Rana for 01/11/2022 at 4:10. Spanish interpreter was Randol Kern #[id number].

## 2021-11-15 NOTE — Telephone Encounter (Signed)
Copied from [place] [id number]. Topic: Appointment Scheduling - Scheduling Inquiry for Clinic >> Nov 15, 2021  1:25 PM McGill, Nelva Bush wrote: Reason for CRM: Pt requesting an appointment with [facility] for financial assistance. Pt stated her orange card expired.

## 2021-11-15 NOTE — Telephone Encounter (Signed)
Reason for Disposition  Age > 60 years  Answer Assessment - Initial Assessment Questions 1. LOCATION: "Where does it hurt?"      Pt calling in using Spanish interpreter.  She is c/o pain in left ovary and stomach area and towards the top on left side.   No pain today.   Yesterday had a lot of pain for 4 hours.   Vaginal bleeding only occurs when I have the pain.   2. RADIATION: "Does the pain shoot anywhere else?" (e.g., chest, back)     *No Answer* 3. ONSET: "When did the pain begin?" (e.g., minutes, hours or days ago)      A year ago it started.   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 I'm having mild bleeding.   I had another surgery.   The bleeding is mild    I've had this so long.    I don't know if the pain is from the ovary or the colon.    I had a colonoscopy too.  4. SUDDEN: "Gradual or sudden onset?"     *No Answer* 5. PATTERN "Does the pain come and go, or is it constant?"    - If constant: "Is it getting better, staying the same, or worsening?"      (Note: Constant means the pain never goes away completely; most serious pain is constant and it progresses)     - If intermittent: "How long does it last?" "Do you have pain now?"     (Note: Intermittent means the pain goes away completely between bouts)     *No Answer* 6. SEVERITY: "How bad is the pain?"  (e.g., Scale 1-10; mild, moderate, or severe)   - MILD (1-3): doesn't interfere with normal activities, abdomen soft and not tender to touch    - MODERATE (4-7): interferes with normal activities or awakens from sleep, abdomen tender to touch    - SEVERE (8-10): excruciating pain, doubled over, unable to do any normal activities      *No Answer* 7. RECURRENT SYMPTOM: "Have you ever had this type of stomach pain before?" If Yes, ask: "When was the last time?" and "What happened that time?"      *No Answer* 8. CAUSE: "What do you think is causing the stomach pain?"     *No Answer* 9. RELIEVING/AGGRAVATING  FACTORS: "What makes it better or worse?" (e.g., movement, antacids, bowel movement)     *No Answer* 10. OTHER SYMPTOMS: "Do you have any other symptoms?" (e.g., back pain, diarrhea, fever, urination pain, vomiting)       *No Answer* 11. PREGNANCY: "Is there any chance you are pregnant?" "When was your last menstrual period?"       *No Answer*  Protocols used: Abdominal Pain - [facility]

## 2021-12-15 ENCOUNTER — Other Ambulatory Visit: Payer: Self-pay | Admitting: Family Medicine

## 2021-12-15 ENCOUNTER — Other Ambulatory Visit: Payer: Self-pay

## 2021-12-15 DIAGNOSIS — I10 Essential (primary) hypertension: Secondary | ICD-10-CM

## 2021-12-15 DIAGNOSIS — E785 Hyperlipidemia, unspecified: Secondary | ICD-10-CM

## 2021-12-15 MED ORDER — ATORVASTATIN CALCIUM 20 MG PO TABS
ORAL_TABLET | Freq: Every day | ORAL | 0 refills | Status: DC
  Filled 2021-12-15: qty 30, 30d supply, fill #0

## 2021-12-15 MED ORDER — LISINOPRIL 10 MG PO TABS
ORAL_TABLET | Freq: Every day | ORAL | 0 refills | Status: DC
Start: 2021-12-15 — End: 2022-01-11
  Filled 2021-12-15: qty 30, 30d supply, fill #0

## 2021-12-16 ENCOUNTER — Other Ambulatory Visit: Payer: Self-pay

## 2021-12-16 ENCOUNTER — Ambulatory Visit: Payer: Self-pay

## 2021-12-17 ENCOUNTER — Ambulatory Visit: Payer: Self-pay | Attending: Family Medicine

## 2021-12-17 ENCOUNTER — Other Ambulatory Visit: Payer: Self-pay

## 2021-12-29 ENCOUNTER — Ambulatory Visit: Payer: Self-pay | Admitting: *Deleted

## 2021-12-29 NOTE — Telephone Encounter (Signed)
?  Chief Complaint: flu like symptoms ?Symptoms: cough, fever, trouble breathing, fatigue ?Frequency: 15 days ?Pertinent Negatives:   ?Disposition: [] ED /[] Urgent Care (no appt availability in office) / [] Appointment(In office/virtual)/ []  [place] [facility]/ [] Home Care/ [] Refused Recommended Disposition /[x]  Mobile Bus/ []  Follow-up with PCP ?Additional Notes: Patient states she can not afford COVID test and does not have financial assistance card- she reapplied and has not received card. Advised patient no open appointment in office today- needs to be seen- advised of mobile provider at [facility] today- can do walk in appointment- she will do that ?

## 2021-12-29 NOTE — Telephone Encounter (Signed)
Scheduled telephone appt with patient for tomorrow.   ?

## 2021-12-29 NOTE — Telephone Encounter (Signed)
Reason for Disposition ? [1] MILD difficulty breathing (e.g., minimal/no SOB at rest, SOB with walking, pulse <100) AND [2] NEW-onset or WORSE than normal ? ?Answer Assessment - Initial Assessment Questions ?1. RESPIRATORY STATUS: "Describe your breathing?" (e.g., wheezing, shortness of breath, unable to speak, severe coughing)  ?    Flu like symptoms- cough,fever, breathing difficulty ?2. ONSET: "When did this breathing problem begin?"  ?    15 days ?3. PATTERN "Does the difficult breathing come and go, or has it been constant since it started?"  ?    Since symptoms started ?4. SEVERITY: "How bad is your breathing?" (e.g., mild, moderate, severe)  ?  - MILD: No SOB at rest, mild SOB with walking, speaks normally in sentences, can lie down, no retractions, pulse < 100.  ?  - MODERATE: SOB at rest, SOB with minimal exertion and prefers to sit, cannot lie down flat, speaks in phrases, mild retractions, audible wheezing, pulse 100-120.  ?  - SEVERE: Very SOB at rest, speaks in single words, struggling to breathe, sitting hunched forward, retractions, pulse > 120  ?    Mild/moderate ?5. RECURRENT SYMPTOM: "Have you had difficulty breathing before?" If Yes, ask: "When was the last time?" and "What happened that time?"  ?    Cough, headache, fatigue, fever ?6. CARDIAC HISTORY: "Do you have any history of heart disease?" (e.g., heart attack, angina, bypass surgery, angioplasty)  ?    *No Answer* ?7. LUNG HISTORY: "Do you have any history of lung disease?"  (e.g., pulmonary embolus, asthma, emphysema) ?    *No Answer* ?8. CAUSE: "What do you think is causing the breathing problem?"  ?    Flu vs COVID ?9. OTHER SYMPTOMS: "Do you have any other symptoms? (e.g., dizziness, runny nose, cough, chest pain, fever) ?    Fever, cough, fatigue ?10. O2 SATURATION MONITOR:  "Do you use an oxygen saturation monitor (pulse oximeter) at home?" If Yes, "What is your reading (oxygen level) today?" "What is your usual oxygen saturation  reading?" (e.g., 95%) ?      *No Answer* ?11. PREGNANCY: "Is there any chance you are pregnant?" "When was your last menstrual period?" ?      *No Answer* ?12. TRAVEL: "Have you traveled out of the country in the last month?" (e.g., travel history, exposures) ?      *No Answer* ? ?Protocols used: Breathing Difficulty-A-AH ? ?

## 2021-12-30 ENCOUNTER — Telehealth: Payer: No Typology Code available for payment source | Admitting: Nurse Practitioner

## 2021-12-30 ENCOUNTER — Other Ambulatory Visit: Payer: Self-pay

## 2022-01-11 ENCOUNTER — Encounter: Payer: Self-pay | Admitting: Family Medicine

## 2022-01-11 ENCOUNTER — Other Ambulatory Visit: Payer: Self-pay

## 2022-01-11 ENCOUNTER — Ambulatory Visit: Payer: No Typology Code available for payment source | Attending: Family Medicine | Admitting: Family Medicine

## 2022-01-11 VITALS — BP 128/83 | HR 91 | Ht 59.0 in | Wt 174.4 lb

## 2022-01-11 DIAGNOSIS — K219 Gastro-esophageal reflux disease without esophagitis: Secondary | ICD-10-CM

## 2022-01-11 DIAGNOSIS — E785 Hyperlipidemia, unspecified: Secondary | ICD-10-CM

## 2022-01-11 DIAGNOSIS — I10 Essential (primary) hypertension: Secondary | ICD-10-CM

## 2022-01-11 DIAGNOSIS — K5909 Other constipation: Secondary | ICD-10-CM

## 2022-01-11 MED ORDER — ATORVASTATIN CALCIUM 20 MG PO TABS
ORAL_TABLET | Freq: Every day | ORAL | 1 refills | Status: DC
  Filled 2022-01-11: qty 30, 30d supply, fill #0
  Filled 2022-06-20: qty 90, 90d supply, fill #1

## 2022-01-11 MED ORDER — LINACLOTIDE 72 MCG PO CAPS
72.0000 ug | ORAL_CAPSULE | Freq: Every day | ORAL | 1 refills | Status: DC
  Filled 2022-01-11: qty 30, 30d supply, fill #0

## 2022-01-11 MED ORDER — OMEPRAZOLE 40 MG PO CPDR
DELAYED_RELEASE_CAPSULE | ORAL | 1 refills | Status: DC
  Filled 2022-01-11: qty 90, fill #0
  Filled 2022-06-20: qty 90, 90d supply, fill #0

## 2022-01-11 MED ORDER — OMEPRAZOLE 40 MG PO CPDR
DELAYED_RELEASE_CAPSULE | ORAL | 1 refills | Status: DC
  Filled 2022-01-11: qty 90, fill #0

## 2022-01-11 MED ORDER — LISINOPRIL 10 MG PO TABS
ORAL_TABLET | Freq: Every day | ORAL | 1 refills | Status: DC
  Filled 2022-01-11: qty 30, 30d supply, fill #0

## 2022-01-11 NOTE — Progress Notes (Signed)
Having pain in lower abdomen with mild vaginal bleeding. ?

## 2022-01-11 NOTE — Patient Instructions (Signed)
Estre?imiento, en adultos ?Constipation, Adult ?Estre?imiento significa que una persona hace menos de tres deposiciones en una semana, tiene dificultades para defecar o las heces (deposiciones) son secas, duras o m?s grandes de lo normal. La causa puede ser una afecci?n subyacente. Puede empeorar con la edad si una persona toma ciertos medicamentos y no toma suficiente l?quido. ?Siga estas instrucciones en su casa: ?Comida y bebida ? ?Consuma alimentos con alto contenido de [place], como frijoles, cereales integrales, y frutas y verduras frescas. ?Limite el consumo de alimentos que sean bajos en fibra y ricos en grasas y az?cares procesados, como los fritos y los dulces. Estos incluyen patatas fritas, hamburguesas, galletas, dulces y refrescos. ?Beba suficiente l?quido como para mantener la orina de color amarillo p?lido. ?Instrucciones generales ?Haga actividad f?sica habitualmente o como se lo haya indicado el m?dico. Intente practicar 150 minutos de actividad f?sica moderada por semana. ?Vaya al ba?o siempre que sienta ganas de defecar. No se aguante las ganas. ?Use los medicamentos de venta libre y los recetados solamente como se lo haya indicado el m?dico. Esto incluye suplementos de [place]. ?En el momento de hacer sus deposiciones: ?Respire profundamente mientras relaja la parte inferior del abdomen. ?Practique la relajaci?n del suelo p?lvico. ?Controle su afecci?n para detectar cualquier cambio. Informe a su m?dico acerca de cualquier cambio. ?Concurra a todas las visitas de seguimiento como se lo haya indicado el m?dico. Esto es importante. ?Comun?quese con un m?dico si: ?Su dolor empeora. ?Tiene fiebre. ?No defeca despu?s de 4 d?as. ?[place]. ?[place] o no tiene apetito. ?Tiene sangrado por la abertura entre las nalgas (ano). ?Las heces son delgadas como un l?piz. ?Solicite ayuda de inmediato si: ?Tiene fiebre, y los s?ntomas empeoran repentinamente. ?Observa que se filtran heces o hay sangre en las  heces. ?Tiene el abdomen distendido. ?Siente un dolor intenso en el abdomen. ?Se siente mareado o se desmaya. ?Resumen ?Estre?imiento significa que una persona hace menos de tres deposiciones en una semana, tiene dificultades para defecar o las heces (deposiciones) son secas, duras o m?s grandes de lo normal. ?Consuma alimentos con alto contenido de [place], como frijoles, cereales integrales, y frutas y verduras frescas. ?Beba suficiente l?quido como para mantener la orina de color amarillo p?lido. ?Use los medicamentos de venta libre y los recetados solamente como se lo haya indicado el m?dico. Esto incluye suplementos de [place]. ?Esta informaci?n no tiene Marine scientist el consejo del m?dico. Aseg?rese de hacerle al m?dico cualquier pregunta que tenga. ?Document Revised: 11/22/2019 Document Reviewed: 11/22/2019 ?Elsevier Patient Education ? [place]. ? ?

## 2022-01-11 NOTE — Progress Notes (Signed)
? ?Subjective:  ?Patient ID: Susan Lopez, female    DOB: 13-Dec-1960  Age: 61 y.o. MRN: [id number] ? ?CC: Abdominal Pain ? ? ?HPI ?[name] is a 61 y.o. year old female with a history of hypertension, hyperlipidemia, history of D&C/hysteroscopy and MyoSure polypectomy in 01/2021 who presents today for chronic disease management ? ?Interval History: ?She has had LLQ pain for years a couple of years. Constipation is present but no nausea, vomiting, diarrhea. She also has hemorrhids and uses fiber with no much relief in constipation and moves her bowels about 2-3 times a week ?Colonoscopy in 04/2018 revealed presence of polyps and pathology revealed tubular adenoma, no evidence of malignancy and follow-up colonoscopy recommended in 5 years. ?She also sometimes has vaginal pain (between her vagina and rectum) and bleeding which she is unsure if it is from her vagina or rectum. ?Pap smear from 10/2020 by GYN revealed ASCUS and repeat recommended in 1 year. ?MRI abdomen from 07/2021 revealed: ?IMPRESSION: ?1. 0.8 cm exophytic hemorrhagic or proteinaceous cyst of the ?inferior pole of the right kidney. No associated solid component or ?contrast enhancement. No further follow-up or characterization is ?required for this definitively benign cyst. ?2. Hepatic steatosis. ?3. Status post cholecystectomy. ? ? ?She also has epigastric pain with spicy food.  Omeprazole appears on her med list but it is uncertain if she has been compliant with it. ?Past Medical History:  ?Diagnosis Date  ? Anxiety   ? Arthritis   ? Chronic gastritis   ? Chronic thoracic spine pain   ? Depression   ? GERD (gastroesophageal reflux disease)   ? Hemorrhoids, external 10-21-11  ? occ. bothersome  ? History of colon polyps   ? Hyperlipidemia   ? Hypertension   ? Iron deficiency anemia   ? OSA on CPAP   ? study in epic 08-29-2017 severe osa (AHI 32.5), per pt daughter uses nightly  ? PMB (postmenopausal bleeding)   ? PONV  (postoperative nausea and vomiting)   ? Pre-diabetes   ? ? ?Past Surgical History:  ?Procedure Laterality Date  ? [place]  ? CHOLECYSTECTOMY  10/27/2011  ? Procedure: LAPAROSCOPIC CHOLECYSTECTOMY WITH INTRAOPERATIVE CHOLANGIOGRAM;  Surgeon: Adin Hector, MD;  Location: [facility];  Service: General;  Laterality: N/A;  Laparoscopic Chole w/ IOC Single Site  ? COLONOSCOPY WITH PROPOFOL  last one 05-09-2018  ? DILATATION & CURETTAGE/HYSTEROSCOPY WITH MYOSURE N/A 03/11/2020  ? Procedure: DILATATION AND CURETTAGE /HYSTEROSCOPY WITH MYOSURE;  Surgeon: Mora Bellman, MD;  Location: [place];  Service: Gynecology;  Laterality: N/A;  ? DILATATION & CURETTAGE/HYSTEROSCOPY WITH MYOSURE N/A 02/02/2021  ? Procedure: DILATATION & CURETTAGE/HYSTEROSCOPY WITH MYOSURE POLYPECTOMY;  Surgeon: Mora Bellman, MD;  Location: [place];  Service: Gynecology;  Laterality: N/A;  ? ESOPHAGOGASTRODUODENOSCOPY (EGD) WITH PROPOFOL  last one 12-30-2019  dr Loletha Carrow  ? EYE SURGERY  10/21/2011  ? bil. for tissue growth-laser surgery  ? HARDWARE REMOVAL Left 03/16/2016  ? Procedure: LEFT ANKLE HARDWARE REMOVAL;  Surgeon: Leandrew Koyanagi, MD;  Location: [facility];  Service: Orthopedics;  Laterality: Left;  ? MM BREAST STEREO BIOPSY LEFT ([place] HX) Left 2015  ? ORIF ANKLE FRACTURE Left 11/13/2015  ? Procedure: OPEN REDUCTION INTERNAL FIXATION (ORIF) LEFT BIMALLEOLAR ANKLE FRACTURE;  Surgeon: Leandrew Koyanagi, MD;  Location: [facility];  Service: Orthopedics;  Laterality: Left;  ? ? ?Family History  ?Problem Relation Age of Onset  ? Hypertension Mother   ?  Cancer Mother   ? Hypertension Father   ? Arthritis Brother   ? Hypertension Brother   ? Intellectual disability Paternal Aunt   ? Colon cancer Neg Hx   ? ? ?Social History  ? ?Socioeconomic History  ? Marital status: Single  ?  Spouse name: Not on file  ? Number of children: 2  ? Years of education: Not on file  ? Highest education level: 4th  grade  ?Occupational History  ? Occupation: unemployed  ?Tobacco Use  ? Smoking status: Former  ?  Packs/day: 0.25  ?  Years: 10.00  ?  Pack years: 2.50  ?  Types: Cigarettes  ?  Quit date: 2008  ?  Years since quitting: 15.2  ? Smokeless tobacco: Never  ?Vaping Use  ? Vaping Use: Never used  ?Substance and Sexual Activity  ? Alcohol use: No  ? Drug use: Never  ? Sexual activity: Not Currently  ?  Birth control/protection: Post-menopausal  ?Other Topics Concern  ? Not on file  ?Social History Narrative  ? Not on file  ? ?Social Determinants of Health  ? ?Financial Resource Strain: Not on file  ?Food Insecurity: Not on file  ?Transportation Needs: Unmet Transportation Needs  ? Lack of Transportation (Medical): Yes  ? Lack of Transportation (Non-Medical): Yes  ?Physical Activity: Not on file  ?Stress: Not on file  ?Social Connections: Not on file  ? ? ?Allergies  ?Allergen Reactions  ? Hydrocodone Nausea And Vomiting  ? Tramadol Nausea Only  ? Oxycodone Rash  ? ? ?Outpatient Medications Prior to Visit  ?Medication Sig Dispense Refill  ? atorvastatin (LIPITOR) 20 MG tablet TAKE 1 TABLET (20 MG TOTAL) BY MOUTH DAILY. 30 tablet 0  ? gabapentin (NEURONTIN) 100 MG capsule TAKE 1 CAPSULE BY MOUTH 2 TIMES DAILY. 90 capsule 0  ? hydrocortisone-pramoxine (ANALPRAM HC) 2.5-1 % rectal cream Place 1 application rectally 3 (three) times daily. 30 g 2  ? lisinopril (ZESTRIL) 10 MG tablet TAKE 1 TABLET (10 MG TOTAL) BY MOUTH DAILY. 30 tablet 0  ? meloxicam (MOBIC) 7.5 MG tablet TAKE 1 TABLET (7.5 MG TOTAL) BY MOUTH DAILY. 90 tablet 1  ? Multiple Vitamin (MULTIVITAMIN WITH MINERALS) TABS tablet Take 1 tablet by mouth daily.    ? omeprazole (PRILOSEC) 40 MG capsule TOME 1 PASTILLA POR VIA ORAL DOS VECES AL DIA A REDUCIR EL ACIDO DEL ESTOMAGO 90 capsule 1  ? polyethylene glycol powder (GLYCOLAX/MIRALAX) 17 GM/SCOOP powder Take 17 g by mouth daily. As needed for constipation relief 3350 g 2  ? Probiotic Product (PROBIOTIC PO) Take by  mouth daily.    ? psyllium (METAMUCIL) 58.6 % powder Take 1 packet by mouth as needed. 2 x weekly    ? UNABLE TO FIND Calcium, magnesium & zinc (all in 1), once daily    ? clotrimazole-betamethasone (LOTRISONE) cream Apply 1 application topically 2 (two) times daily. As needed for skin irritation (Patient not taking: Reported on 02/04/2021) 30 g 2  ? oxyCODONE-acetaminophen (PERCOCET/ROXICET) 5-325 MG tablet Take 1 tablet by mouth every 4 (four) hours as needed. (Patient not taking: Reported on 01/11/2022) 15 tablet 0  ? ?No facility-administered medications prior to visit.  ? ? ? ?ROS ?Review of Systems  ?Constitutional:  Negative for activity change, appetite change and fatigue.  ?HENT:  Negative for congestion, sinus pressure and sore throat.   ?Eyes:  Negative for visual disturbance.  ?Respiratory:  Negative for cough, chest tightness, shortness of breath and wheezing.   ?  Cardiovascular:  Negative for chest pain and palpitations.  ?Gastrointestinal:  Positive for abdominal pain, blood in stool and constipation. Negative for abdominal distention.  ?Endocrine: Negative for polydipsia.  ?Genitourinary:  Negative for dysuria and frequency.  ?Musculoskeletal:  Negative for arthralgias and back pain.  ?Skin:  Negative for rash.  ?Neurological:  Negative for tremors, light-headedness and numbness.  ?Hematological:  Does not bruise/bleed easily.  ?Psychiatric/Behavioral:  Negative for agitation and behavioral problems.   ? ?Objective:  ?BP 128/83   Pulse 91   Ht '4\' 11"'$  (1.499 m)   Wt 174 lb 6.4 oz (79.1 kg)   LMP 10/08/2015 (Exact Date)   SpO2 96%   BMI 35.22 kg/m?  ? ?BP/Weight 01/11/2022 08/17/2021 02/23/2021  ?Systolic BP 427 062 376  ?Diastolic BP 83 78 73  ?Wt. (Lbs) 174.4 172 175  ?BMI 35.22 29.52 35.28  ? ? ? ? ?Physical Exam ?Constitutional:   ?   Appearance: She is well-developed.  ?Cardiovascular:  ?   Rate and Rhythm: Normal rate.  ?   Heart sounds: Normal heart sounds. No murmur heard. ?Pulmonary:  ?    Effort: Pulmonary effort is normal.  ?   Breath sounds: Normal breath sounds. No wheezing or rales.  ?Chest:  ?   Chest wall: No tenderness.  ?Abdominal:  ?   General: Bowel sounds are normal. There is no distension.  ?   [place]

## 2022-01-12 ENCOUNTER — Encounter: Payer: Self-pay | Admitting: Family Medicine

## 2022-01-12 ENCOUNTER — Other Ambulatory Visit: Payer: Self-pay

## 2022-01-12 NOTE — Progress Notes (Signed)
? ?Established Patient Office Visit ? ?Subjective:  ?Patient ID: Susan Lopez, female    DOB: [birth year]  Age: 61 y.o. MRN: [id number] ? ?CC:  ?Chief Complaint  ?Patient presents with  ? Breathing Problem  ? Cough  ? ? ?HPI ?[name] presents for referral of chronic cough and dyspnea.  This patient had a flulike illness a month ago did not get COVID tested. ? ?This visit is assisted by video interpreter for [place] [phone] ? ?On arrival blood pressure 118/75.  She had fever headaches and nose pain a month ago.  Now she is left with a chronic dry cough dyspnea with exertion irritated throat and fullness in the right ear with sinus congestion. ? ?Note the patient is on an ACE inhibitor. ?Patient does note some wheezing.  She does not have any current fever or other referable complaints.  She does have mild reflux symptoms and she is on an antiacid. ? ? ?Past Medical History:  ?Diagnosis Date  ? Anxiety   ? Arthritis   ? Chronic gastritis   ? Chronic thoracic spine pain   ? Depression   ? GERD (gastroesophageal reflux disease)   ? Hemorrhoids, external 10-21-11  ? occ. bothersome  ? History of colon polyps   ? Hyperlipidemia   ? Hypertension   ? Iron deficiency anemia   ? OSA on CPAP   ? study in epic 08-29-2017 severe osa (AHI 32.5), per pt daughter uses nightly  ? PMB (postmenopausal bleeding)   ? PONV (postoperative nausea and vomiting)   ? Pre-diabetes   ? ? ?Past Surgical History:  ?Procedure Laterality Date  ? [place]  ? CHOLECYSTECTOMY  10/27/2011  ? Procedure: LAPAROSCOPIC CHOLECYSTECTOMY WITH INTRAOPERATIVE CHOLANGIOGRAM;  Surgeon: Adin Hector, MD;  Location: [facility];  Service: General;  Laterality: N/A;  Laparoscopic Chole w/ IOC Single Site  ? COLONOSCOPY WITH PROPOFOL  last one 05-09-2018  ? DILATATION & CURETTAGE/HYSTEROSCOPY WITH MYOSURE N/A 03/11/2020  ? Procedure: DILATATION AND CURETTAGE /HYSTEROSCOPY WITH MYOSURE;  Surgeon: Mora Bellman, MD;   Location: [place];  Service: Gynecology;  Laterality: N/A;  ? DILATATION & CURETTAGE/HYSTEROSCOPY WITH MYOSURE N/A 02/02/2021  ? Procedure: DILATATION & CURETTAGE/HYSTEROSCOPY WITH MYOSURE POLYPECTOMY;  Surgeon: Mora Bellman, MD;  Location: [place];  Service: Gynecology;  Laterality: N/A;  ? ESOPHAGOGASTRODUODENOSCOPY (EGD) WITH PROPOFOL  last one 12-30-2019  dr Loletha Carrow  ? EYE SURGERY  10/21/2011  ? bil. for tissue growth-laser surgery  ? HARDWARE REMOVAL Left 03/16/2016  ? Procedure: LEFT ANKLE HARDWARE REMOVAL;  Surgeon: Leandrew Koyanagi, MD;  Location: [place];  Service: Orthopedics;  Laterality: Left;  ? MM BREAST STEREO BIOPSY LEFT ([place] HX) Left 2015  ? ORIF ANKLE FRACTURE Left 11/13/2015  ? Procedure: OPEN REDUCTION INTERNAL FIXATION (ORIF) LEFT BIMALLEOLAR ANKLE FRACTURE;  Surgeon: Leandrew Koyanagi, MD;  Location: [place];  Service: Orthopedics;  Laterality: Left;  ? ? ?Family History  ?Problem Relation Age of Onset  ? Hypertension Mother   ? Cancer Mother   ? Hypertension Father   ? Arthritis Brother   ? Hypertension Brother   ? Intellectual disability Paternal Aunt   ? Colon cancer Neg Hx   ? ? ?Social History  ? ?Socioeconomic History  ? Marital status: Single  ?  Spouse name: Not on file  ? Number of children: 2  ? Years of education: Not on file  ? Highest education level: 4th grade  ?  Occupational History  ? Occupation: unemployed  ?Tobacco Use  ? Smoking status: Former  ?  Packs/day: 0.25  ?  Years: 10.00  ?  Pack years: 2.50  ?  Types: Cigarettes  ?  Quit date: 2008  ?  Years since quitting: 15.2  ? Smokeless tobacco: Never  ?Vaping Use  ? Vaping Use: Never used  ?Substance and Sexual Activity  ? Alcohol use: No  ? Drug use: Never  ? Sexual activity: Not Currently  ?  Birth control/protection: Post-menopausal  ?Other Topics Concern  ? Not on file  ?Social History Narrative  ? Not on file  ? ?Social Determinants of Health  ? ?Financial Resource Strain: Not on  file  ?Food Insecurity: Not on file  ?Transportation Needs: Unmet Transportation Needs  ? Lack of Transportation (Medical): Yes  ? Lack of Transportation (Non-Medical): Yes  ?Physical Activity: Not on file  ?Stress: Not on file  ?Social Connections: Not on file  ?Intimate Partner Violence: Not on file  ? ? ?Outpatient Medications Prior to Visit  ?Medication Sig Dispense Refill  ? atorvastatin (LIPITOR) 20 MG tablet TAKE 1 TABLET (20 MG TOTAL) BY MOUTH DAILY. 90 tablet 1  ? clotrimazole-betamethasone (LOTRISONE) cream Apply 1 application topically 2 (two) times daily. As needed for skin irritation 30 g 2  ? gabapentin (NEURONTIN) 100 MG capsule TAKE 1 CAPSULE BY MOUTH 2 TIMES DAILY. 90 capsule 0  ? hydrocortisone-pramoxine (ANALPRAM HC) 2.5-1 % rectal cream Place 1 application rectally 3 (three) times daily. 30 g 2  ? linaclotide (LINZESS) 72 MCG capsule Take 1 capsule (72 mcg total) by mouth daily before breakfast. 30 capsule 1  ? meloxicam (MOBIC) 7.5 MG tablet TAKE 1 TABLET (7.5 MG TOTAL) BY MOUTH DAILY. 90 tablet 1  ? Multiple Vitamin (MULTIVITAMIN WITH MINERALS) TABS tablet Take 1 tablet by mouth daily.    ? omeprazole (PRILOSEC) 40 MG capsule TOME 1 PASTILLA POR VIA ORAL DOS VECES AL DIA A REDUCIR EL ACIDO DEL ESTOMAGO 90 capsule 1  ? oxyCODONE-acetaminophen (PERCOCET/ROXICET) 5-325 MG tablet Take 1 tablet by mouth every 4 (four) hours as needed. 15 tablet 0  ? polyethylene glycol powder (GLYCOLAX/MIRALAX) 17 GM/SCOOP powder Take 17 g by mouth daily. As needed for constipation relief 3350 g 2  ? Probiotic Product (PROBIOTIC PO) Take by mouth daily.    ? psyllium (METAMUCIL) 58.6 % powder Take 1 packet by mouth as needed. 2 x weekly    ? UNABLE TO FIND Calcium, magnesium & zinc (all in 1), once daily    ? lisinopril (ZESTRIL) 10 MG tablet TAKE 1 TABLET (10 MG TOTAL) BY MOUTH DAILY. 90 tablet 1  ? ?No facility-administered medications prior to visit.  ? ? ?Allergies  ?Allergen Reactions  ? Hydrocodone Nausea And  Vomiting  ? Tramadol Nausea Only  ? Oxycodone Rash  ? ? ?ROS ?Review of Systems  ?Constitutional: Negative.  Negative for diaphoresis and fever.  ?HENT:  Positive for congestion, ear pain, postnasal drip, rhinorrhea, sinus pressure and sinus pain. Negative for dental problem, drooling, ear discharge, facial swelling, hearing loss, mouth sores, nosebleeds, sneezing, sore throat, tinnitus, trouble swallowing and voice change.   ?Eyes: Negative.   ?Respiratory:  Positive for cough, chest tightness, shortness of breath and wheezing. Negative for apnea, choking and stridor.   ?     Dry cough  ?Cardiovascular: Negative.  Negative for chest pain, palpitations and leg swelling.  ?Gastrointestinal: Negative.  Negative for abdominal distention, abdominal pain, nausea and vomiting.  ?  Gerd  ?Genitourinary: Negative.   ?Musculoskeletal: Negative.  Negative for arthralgias and myalgias.  ?Skin: Negative.  Negative for rash.  ?Allergic/Immunologic: Negative.  Negative for environmental allergies and food allergies.  ?Neurological: Negative.  Negative for dizziness, syncope, weakness and headaches.  ?Hematological: Negative.  Negative for adenopathy. Does not bruise/bleed easily.  ?Psychiatric/Behavioral: Negative.  Negative for agitation and sleep disturbance. The patient is not nervous/anxious.   ? ?  ?Objective:  ?  ?Physical Exam ?Vitals reviewed.  ?Constitutional:   ?   Appearance: Normal appearance. She is well-developed. She is obese. She is not diaphoretic.  ?HENT:  ?   Head: Normocephalic and atraumatic.  ?   Right Ear: Ear canal and external ear normal.  ?   Left Ear: Tympanic membrane, ear canal and external ear normal.  ?   Ears:  ?   Comments: Mild air-fluid level in right tympanic membrane behind the membrane ?   Nose: Congestion and rhinorrhea present. No nasal deformity, septal deviation or mucosal edema.  ?   Right Sinus: No maxillary sinus tenderness or frontal sinus tenderness.  ?   Left Sinus: No maxillary  sinus tenderness or frontal sinus tenderness.  ?   Comments: Purulence seen in both nare ?   Mouth/Throat:  ?   Mouth: Mucous membranes are moist.  ?   Pharynx: Oropharynx is clear. No oropharyngeal exud

## 2022-01-13 ENCOUNTER — Ambulatory Visit
Payer: No Typology Code available for payment source | Attending: Critical Care Medicine | Admitting: Critical Care Medicine

## 2022-01-13 ENCOUNTER — Other Ambulatory Visit: Payer: Self-pay

## 2022-01-13 ENCOUNTER — Encounter: Payer: Self-pay | Admitting: Critical Care Medicine

## 2022-01-13 VITALS — BP 118/75 | HR 72 | Wt 175.2 lb

## 2022-01-13 DIAGNOSIS — J309 Allergic rhinitis, unspecified: Secondary | ICD-10-CM | POA: Insufficient documentation

## 2022-01-13 DIAGNOSIS — G4733 Obstructive sleep apnea (adult) (pediatric): Secondary | ICD-10-CM

## 2022-01-13 DIAGNOSIS — J4521 Mild intermittent asthma with (acute) exacerbation: Secondary | ICD-10-CM

## 2022-01-13 DIAGNOSIS — R058 Other specified cough: Secondary | ICD-10-CM

## 2022-01-13 DIAGNOSIS — I700 Atherosclerosis of aorta: Secondary | ICD-10-CM

## 2022-01-13 DIAGNOSIS — T464X5A Adverse effect of angiotensin-converting-enzyme inhibitors, initial encounter: Secondary | ICD-10-CM

## 2022-01-13 DIAGNOSIS — J0191 Acute recurrent sinusitis, unspecified: Secondary | ICD-10-CM

## 2022-01-13 DIAGNOSIS — R0982 Postnasal drip: Secondary | ICD-10-CM

## 2022-01-13 DIAGNOSIS — E785 Hyperlipidemia, unspecified: Secondary | ICD-10-CM

## 2022-01-13 MED ORDER — PREDNISONE 10 MG PO TABS
ORAL_TABLET | ORAL | 0 refills | Status: DC
  Filled 2022-01-13: qty 20, 5d supply, fill #0

## 2022-01-13 MED ORDER — FLUTICASONE PROPIONATE 50 MCG/ACT NA SUSP
2.0000 | Freq: Every day | NASAL | 6 refills | Status: DC
  Filled 2022-01-13: qty 16, 30d supply, fill #0

## 2022-01-13 MED ORDER — AZITHROMYCIN 250 MG PO TABS
ORAL_TABLET | ORAL | 0 refills | Status: DC
  Filled 2022-01-13: qty 6, 5d supply, fill #0

## 2022-01-13 MED ORDER — ALBUTEROL SULFATE HFA 108 (90 BASE) MCG/ACT IN AERS
2.0000 | INHALATION_SPRAY | Freq: Four times a day (QID) | RESPIRATORY_TRACT | 0 refills | Status: DC | PRN
  Filled 2022-01-13: qty 8.5, 25d supply, fill #0

## 2022-01-13 MED ORDER — BENZONATATE 100 MG PO CAPS
100.0000 mg | ORAL_CAPSULE | Freq: Two times a day (BID) | ORAL | 0 refills | Status: DC | PRN
  Filled 2022-01-13: qty 40, 20d supply, fill #0

## 2022-01-13 MED ORDER — VALSARTAN 40 MG PO TABS
40.0000 mg | ORAL_TABLET | Freq: Every day | ORAL | 3 refills | Status: DC
  Filled 2022-01-13: qty 30, 30d supply, fill #0

## 2022-01-13 NOTE — Assessment & Plan Note (Signed)
Start topical nasal steroid ?

## 2022-01-13 NOTE — Assessment & Plan Note (Signed)
As an aside the patient states her CPAP machine is not working well she has seen pulmonary before for this I made a referral to the sleep medicine clinic for this ?

## 2022-01-13 NOTE — Assessment & Plan Note (Signed)
Give as needed albuterol give pulse prednisone ?

## 2022-01-13 NOTE — Assessment & Plan Note (Signed)
Recurrent sinusitis will prescribe a course of azithromycin and pulsed prednisone give topical nasal steroid ?

## 2022-01-13 NOTE — Assessment & Plan Note (Signed)
Suspect part of the cough is due to the ACE inhibitor we will discontinue lisinopril and begin valsartan also give benzonatate as needed ?

## 2022-01-13 NOTE — Patient Instructions (Signed)
Start albuterol 2 puffs every 6 hours as needed for shortness of breath ? ?Use benzonatate 3 times daily as needed for cough ? ?Take prednisone for 5 days as directed ? ?Take azithromycin for 5 days as directed ? ?Stop lisinopril and begin valsartan 1 daily for blood pressure ? ?Use Flonase 2 sprays each nostril daily for nasal congestion ? ?Return to see Dr. Joya Gaskins 1 month ? ?Dr. Joya Gaskins sent a referral to pulmonary medicine the sleep medicine office take your machine with you for your sleep apnea when you go so they can reassess your sleep cPap machine ?

## 2022-01-14 LAB — HEMOGLOBIN A1C
Est. average glucose Bld gHb Est-mCnc: 126 mg/dL
Hgb A1c MFr Bld: 6 % — ABNORMAL HIGH (ref 4.8–5.6)

## 2022-01-14 LAB — CMP14+EGFR
ALT: 22 IU/L (ref 0–32)
AST: 19 IU/L (ref 0–40)
Albumin/Globulin Ratio: 1.6 (ref 1.2–2.2)
Albumin: 4.6 g/dL (ref 3.8–4.8)
Alkaline Phosphatase: 148 IU/L — ABNORMAL HIGH (ref 44–121)
BUN/Creatinine Ratio: 16 (ref 12–28)
BUN: 9 mg/dL (ref 8–27)
Bilirubin Total: 0.6 mg/dL (ref 0.0–1.2)
CO2: 25 mmol/L (ref 20–29)
Calcium: 9.6 mg/dL (ref 8.7–10.3)
Chloride: 103 mmol/L (ref 96–106)
Creatinine, Ser: 0.56 mg/dL — ABNORMAL LOW (ref 0.57–1.00)
Globulin, Total: 2.8 g/dL (ref 1.5–4.5)
Glucose: 115 mg/dL — ABNORMAL HIGH (ref 70–99)
Potassium: 4.5 mmol/L (ref 3.5–5.2)
Sodium: 141 mmol/L (ref 134–144)
Total Protein: 7.4 g/dL (ref 6.0–8.5)
eGFR: 104 mL/min/{1.73_m2} (ref >59)

## 2022-01-14 LAB — CBC WITH DIFFERENTIAL/PLATELET
Basophils Absolute: 0.1 10*3/uL (ref 0.0–0.2)
Basos: 1 %
EOS (ABSOLUTE): 0.2 10*3/uL (ref 0.0–0.4)
Eos: 2 %
Hematocrit: 42.4 % (ref 34.0–46.6)
Hemoglobin: 14.6 g/dL (ref 11.1–15.9)
Immature Grans (Abs): 0 10*3/uL (ref 0.0–0.1)
Immature Granulocytes: 0 %
Lymphocytes Absolute: 2.2 10*3/uL (ref 0.7–3.1)
Lymphs: 33 %
MCH: 31.5 pg (ref 26.6–33.0)
MCHC: 34.4 g/dL (ref 31.5–35.7)
MCV: 91 fL (ref 79–97)
Monocytes Absolute: 0.4 10*3/uL (ref 0.1–0.9)
Monocytes: 6 %
Neutrophils Absolute: 3.9 10*3/uL (ref 1.4–7.0)
Neutrophils: 58 %
Platelets: 277 10*3/uL (ref 150–450)
RBC: 4.64 x10E6/uL (ref 3.77–5.28)
RDW: 12.5 % (ref 11.7–15.4)
WBC: 6.8 10*3/uL (ref 3.4–10.8)

## 2022-01-14 LAB — LP+NON-HDL CHOLESTEROL
Cholesterol, Total: 146 mg/dL (ref 100–199)
HDL: 46 mg/dL (ref >39)
LDL Chol Calc (NIH): 77 mg/dL (ref 0–99)
Total Non-HDL-Chol (LDL+VLDL): 100 mg/dL (ref 0–129)
Triglycerides: 127 mg/dL (ref 0–149)
VLDL Cholesterol Cal: 23 mg/dL (ref 5–40)

## 2022-02-18 ENCOUNTER — Ambulatory Visit (INDEPENDENT_AMBULATORY_CARE_PROVIDER_SITE_OTHER): Payer: Self-pay | Admitting: Primary Care

## 2022-02-18 ENCOUNTER — Encounter: Payer: Self-pay | Admitting: Primary Care

## 2022-02-18 VITALS — BP 108/66 | HR 83 | Temp 99.1°F | Ht 59.0 in | Wt 174.6 lb

## 2022-02-18 DIAGNOSIS — G4733 Obstructive sleep apnea (adult) (pediatric): Secondary | ICD-10-CM

## 2022-02-18 NOTE — Patient Instructions (Addendum)
Orders: ?- Need order to establish with DME company for CPAP compliance, enroll in airview/provide patient with SD card and new CPAP supplies. Sleep study was in 2018. Current CPAP pressure is 12cm h20.  ? ?*You will need to bring CPAP to [facility] (DME company) - there address is [address] Phone number is [phone]  ? ?Follow-up: ?- 6 weeks with Eustaquio Maize Np (will need interpreter) / please bring SD card with you if you are provided one from DME company  ? ? ?_____________________________________________________________ ?Pedidos: ?- Se necesita un pedido para establecer con la compa??a DME para el cumplimiento de CPAP, inscribirse en airview/proporcionar al Jessee Avers una tarjeta SD y nuevos suministros de CPAP. El estudio del sue?o fue en 2018. La presi?n CPAP actual es de 12 cm h20. ? ?*Deber? traer CPAP a [facility] (compa??a DME) - su direcci?n es [address] El n?mero de tel?fono es [place] [phone] ? ?Hacer un seguimiento: ?- 6 semanas con Beth Np (necesitar? un int?rprete) / traiga una tarjeta SD con usted si se le proporciona una de la compa??a DME__________________ ?

## 2022-02-18 NOTE — Assessment & Plan Note (Addendum)
-   NSP[facility] 07/3017>> severe OSA, AHI 32.5/hr with SpO2 low 80%. Weight 168lbs. CPAP titration study on 10/28/2017 showed optimal pressure 12cm h20. She was supplied CPAP from the hospital, never set up with DME company. Reports compliance with CPAP use but issues with pressure setting. Continues to have snoring symptoms and daytime sleepiness. Epworth score 9. Needs to establish with local DME company for compliance report and CPAP supplies. Spoke with Brad at [facility] and was told she could bring machine by for download or be provided SD card. Address was given to patient along with their phone number. FU in 6 weeks.  ?

## 2022-02-18 NOTE — Progress Notes (Signed)
Reviewed and agree with assessment/plan. ? ? ?Chesley Mires, MD ?[place] ?02/18/2022, 4:11 PM ?Pager:  [phone] ? ?

## 2022-02-18 NOTE — Progress Notes (Addendum)
? ?'@Patient'$  ID: Susan Lopez, female    DOB: 04/04/1961, 61 y.o.   MRN: [id number] ? ?No chief complaint on file. ? ? ?Referring provider: ?Elsie Stain, MD ? ?HPI: ?61 year old female, former smoker quit in 2008. PMH significant OSA, mild intermittent asthma, allergic rhinitis, hypertension, aortic arthrosclerosis, GERD. ? ?02/18/2022 ?Patient presents today for sleep consult. Interpreter was present. Patient states that her CPAP is no longer working.  She had a NPSG on 08/29/17 that showed severe obstructive sleep apnea, AHI 32.5/hr with SPO2 low 80%.  Weight 168 lb.  She underwent CPAP titration study on 10/28/2017 and found to have optimal PAP pressure of 12 cm H2O. Patient tells me that she was supplied CPAP from the hospital prior to 2020. She does not have a current DME company and has not been receiving regular replacement CPAP supplies. She has three masks that she will rotate and clean. She has been having issues with her pressure setting recently. Feels pressure is too strong, states that it is "blowing too much" . She continues to wear CPAP every night. When she does not use CPAP she has headache in the morning. She has symptoms of snoring and falls asleep easily during the day time. Epworth score 9.  ? ?Sleep questionnaire ?Symptoms-   Snores, falls asleep easily during the day ?Prior sleep study- 08/29/2017 ?Bedtime- 10:30-11pm  ?Time to fall asleep- 15-20 mins ?Nocturnal awakenings- 3-4 times  ?Out of bed/start of day- 6:30-7:30am ?Weight changes- went up  ?Do you operate heavy machinery- No ?Do you currently wear CPAP- Yes, no current DME company  ?Do you current wear oxygen- No ?Epworth- 9 ? ? ?Allergies  ?Allergen Reactions  ? Hydrocodone Nausea And Vomiting  ? Tramadol Nausea Only  ? Oxycodone Rash  ? ? ?There is no immunization history for the selected administration types on file for this patient. ? ?Past Medical History:  ?Diagnosis Date  ? Anxiety   ? Arthritis   ? Chronic  gastritis   ? Chronic thoracic spine pain   ? Depression   ? GERD (gastroesophageal reflux disease)   ? Hemorrhoids, external 10-21-11  ? occ. bothersome  ? History of colon polyps   ? Hyperlipidemia   ? Hypertension   ? Iron deficiency anemia   ? OSA on CPAP   ? study in epic 08-29-2017 severe osa (AHI 32.5), per pt daughter uses nightly  ? PMB (postmenopausal bleeding)   ? PONV (postoperative nausea and vomiting)   ? Pre-diabetes   ? ? ?Tobacco History: ?Social History  ? ?Tobacco Use  ?Smoking Status Former  ? Packs/day: 0.25  ? Years: 10.00  ? Pack years: 2.50  ? Types: Cigarettes  ? Quit date: 2008  ? Years since quitting: 15.3  ?Smokeless Tobacco Never  ? ?Counseling given: Not Answered ? ? ?Outpatient Medications Prior to Visit  ?Medication Sig Dispense Refill  ? albuterol (VENTOLIN HFA) 108 (90 Base) MCG/ACT inhaler Inhale 2 puffs into the lungs every 6 (six) hours as needed for wheezing or shortness of breath. 8.5 g 0  ? atorvastatin (LIPITOR) 20 MG tablet TAKE 1 TABLET (20 MG TOTAL) BY MOUTH DAILY. 90 tablet 1  ? benzonatate (TESSALON) 100 MG capsule Take 1 capsule (100 mg total) by mouth 2 (two) times daily as needed for cough. 40 capsule 0  ? fluticasone (FLONASE) 50 MCG/ACT nasal spray Place 2 sprays into both nostrils daily. 16 g 6  ? gabapentin (NEURONTIN) 100 MG capsule TAKE 1 CAPSULE BY MOUTH  2 TIMES DAILY. 90 capsule 0  ? hydrocortisone-pramoxine (ANALPRAM HC) 2.5-1 % rectal cream Place 1 application rectally 3 (three) times daily. 30 g 2  ? linaclotide (LINZESS) 72 MCG capsule Take 1 capsule (72 mcg total) by mouth daily before breakfast. 30 capsule 1  ? meloxicam (MOBIC) 7.5 MG tablet TAKE 1 TABLET (7.5 MG TOTAL) BY MOUTH DAILY. 90 tablet 1  ? Multiple Vitamin (MULTIVITAMIN WITH MINERALS) TABS tablet Take 1 tablet by mouth daily.    ? omeprazole (PRILOSEC) 40 MG capsule TOME 1 PASTILLA POR VIA ORAL DOS VECES AL DIA A REDUCIR EL ACIDO DEL ESTOMAGO 90 capsule 1  ? oxyCODONE-acetaminophen  (PERCOCET/ROXICET) 5-325 MG tablet Take 1 tablet by mouth every 4 (four) hours as needed. 15 tablet 0  ? polyethylene glycol powder (GLYCOLAX/MIRALAX) 17 GM/SCOOP powder Take 17 g by mouth daily. As needed for constipation relief 3350 g 2  ? Probiotic Product (PROBIOTIC PO) Take by mouth daily.    ? psyllium (METAMUCIL) 58.6 % powder Take 1 packet by mouth as needed. 2 x weekly    ? UNABLE TO FIND Calcium, magnesium & zinc (all in 1), once daily    ? valsartan (DIOVAN) 40 MG tablet Take 1 tablet (40 mg total) by mouth daily. 60 tablet 3  ? azithromycin (ZITHROMAX) 250 MG tablet Take two tabs by mouth once day 1 , then take one tab daily x 4 days until gone (Patient not taking: Reported on 02/18/2022) 6 tablet 0  ? clotrimazole-betamethasone (LOTRISONE) cream Apply 1 application topically 2 (two) times daily. As needed for skin irritation (Patient not taking: Reported on 02/18/2022) 30 g 2  ? predniSONE (DELTASONE) 10 MG tablet Take 4 tablets daily for 5 days then stop (Patient not taking: Reported on 02/18/2022) 20 tablet 0  ? ?No facility-administered medications prior to visit.  ? ?Review of Systems ? ?Review of Systems  ?Constitutional:  Positive for fatigue.  ?Respiratory: Negative.    ?Cardiovascular: Negative.   ?Psychiatric/Behavioral:  Positive for sleep disturbance.   ? ? ?Physical Exam ? ?BP 108/66 (BP Location: Left Arm, Patient Position: Sitting, Cuff Size: Normal)   Pulse 83   Temp 99.1 ?F (37.3 ?C) (Oral)   Ht '4\' 11"'$  (1.499 m)   Wt 174 lb 9.6 oz (79.2 kg)   LMP 10/08/2015 (Exact Date)   SpO2 96%   BMI 35.26 kg/m?  ?Physical Exam ?Constitutional:   ?   Appearance: Normal appearance.  ?HENT:  ?   Head: Normocephalic and atraumatic.  ?Cardiovascular:  ?   Rate and Rhythm: Normal rate.  ?Pulmonary:  ?   Effort: Pulmonary effort is normal.  ?Musculoskeletal:     ?   General: Normal range of motion.  ?Skin: ?   General: Skin is warm and dry.  ?Neurological:  ?   General: No focal deficit present.  ?    Mental Status: She is alert and oriented to person, place, and time. Mental status is at baseline.  ?Psychiatric:     ?   Mood and Affect: Mood normal.     ?   Behavior: Behavior normal.     ?   Thought Content: Thought content normal.     ?   Judgment: Judgment normal.  ?  ? ?Lab Results: ? ?CBC ?   ?Component Value Date/Time  ? WBC 6.8 01/13/2022 1110  ? WBC 7.0 03/09/2020 1017  ? RBC 4.64 01/13/2022 1110  ? RBC 4.70 03/09/2020 1017  ? HGB 14.6 01/13/2022 1110  ?  HGB 14.9 10/08/2013 0853  ? HCT 42.4 01/13/2022 1110  ? HCT 43.2 10/08/2013 0853  ? PLT 277 01/13/2022 1110  ? MCV 91 01/13/2022 1110  ? MCV 92.8 10/08/2013 0853  ? MCH 31.5 01/13/2022 1110  ? MCH 31.1 03/09/2020 1017  ? MCHC 34.4 01/13/2022 1110  ? MCHC 32.7 03/09/2020 1017  ? RDW 12.5 01/13/2022 1110  ? RDW 12.8 10/08/2013 0853  ? LYMPHSABS 2.2 01/13/2022 1110  ? LYMPHSABS 2.1 10/08/2013 0853  ? MONOABS 0.4 03/07/2018 1046  ? MONOABS 0.4 10/08/2013 0853  ? EOSABS 0.2 01/13/2022 1110  ? BASOSABS 0.1 01/13/2022 1110  ? BASOSABS 0.1 10/08/2013 0853  ? ? ?BMET ?   ?Component Value Date/Time  ? NA 141 01/13/2022 1110  ? NA 142 10/08/2013 0853  ? K 4.5 01/13/2022 1110  ? K 3.9 10/08/2013 0853  ? CL 103 01/13/2022 1110  ? CO2 25 01/13/2022 1110  ? CO2 23 10/08/2013 0853  ? GLUCOSE 115 (H) 01/13/2022 1110  ? GLUCOSE 109 (H) 02/02/2021 1028  ? GLUCOSE 100 12/20/2013 1124  ? BUN 9 01/13/2022 1110  ? BUN 10.4 10/08/2013 0853  ? CREATININE 0.56 (L) 01/13/2022 1110  ? CREATININE 0.61 11/23/2016 0952  ? CREATININE 0.7 10/08/2013 0853  ? CALCIUM 9.6 01/13/2022 1110  ? CALCIUM 9.7 10/08/2013 0853  ? GFRNONAA 94 10/05/2020 1608  ? GFRNONAA >89 11/23/2016 1601  ? GFRAA 108 10/05/2020 1608  ? GFRAA >89 11/23/2016 0952  ? ? ?BNP ?No results found for: BNP ? ?ProBNP ?No results found for: PROBNP ? ?Imaging: ?No results found. ? ? ?Assessment & Plan:  ? ?OSA (obstructive sleep apnea) ?- NSPG 07/3017>> severe OSA, AHI 32.5/hr with SpO2 low 80%. Weight 168lbs. CPAP titration  study on 10/28/2017 showed optimal pressure 12cm h20. She was supplied CPAP from the hospital, never set up with DME company. Reports compliance with CPAP use but issues with pressure setting. Continues to have snoring symptoms

## 2022-02-21 ENCOUNTER — Ambulatory Visit: Payer: No Typology Code available for payment source | Admitting: Family Medicine

## 2022-02-21 ENCOUNTER — Other Ambulatory Visit: Payer: Self-pay

## 2022-02-21 ENCOUNTER — Ambulatory Visit: Payer: Self-pay | Attending: Family Medicine | Admitting: Family Medicine

## 2022-02-21 ENCOUNTER — Encounter: Payer: Self-pay | Admitting: Family Medicine

## 2022-02-21 VITALS — BP 132/81 | HR 80 | Ht 59.0 in | Wt 176.6 lb

## 2022-02-21 DIAGNOSIS — G8929 Other chronic pain: Secondary | ICD-10-CM

## 2022-02-21 DIAGNOSIS — I10 Essential (primary) hypertension: Secondary | ICD-10-CM

## 2022-02-21 DIAGNOSIS — M546 Pain in thoracic spine: Secondary | ICD-10-CM

## 2022-02-21 DIAGNOSIS — R1084 Generalized abdominal pain: Secondary | ICD-10-CM

## 2022-02-21 MED ORDER — GABAPENTIN 100 MG PO CAPS
ORAL_CAPSULE | Freq: Two times a day (BID) | ORAL | 1 refills | Status: DC
  Filled 2022-02-21: qty 180, 90d supply, fill #0

## 2022-02-21 MED ORDER — LISINOPRIL 10 MG PO TABS
10.0000 mg | ORAL_TABLET | Freq: Every day | ORAL | 1 refills | Status: DC
  Filled 2022-02-21: qty 90, 90d supply, fill #0
  Filled 2022-06-20: qty 90, 90d supply, fill #1

## 2022-02-21 NOTE — Progress Notes (Signed)
States that abdominal pain is getting better. ? ?

## 2022-02-21 NOTE — Progress Notes (Signed)
? ?Subjective:  ?Patient ID: Susan Lopez, female    DOB: August 10, 1961  Age: 61 y.o. MRN: [id number] ? ?CC: Abdominal Pain ? ? ?HPI ?Susan Lopez is a 61 y.o. year old female with a history of hypertension, hyperlipidemia, history of D&C/hysteroscopy and MyoSure polypectomy in 01/2021 who presents today for chronic disease management ?  ? ?Interval History: ?At her last visit she had complained of abdominal pain and was placed on a PPI as well as Linzess for treatment of both GERD and constipation but states today symptoms have improved. ?She ran out of her Linzess and omeprazole and was not aware she had refills at the pharmacy. ? ?She has a visit with Dr Joya Gaskins last month and Lisinipril was changed to Valsartan due to suspicion for ACEi induced cough.  She was also treated for acute sinusitis at that visit.  She states cough has resolved and she would like to resume lisinopril. ?Past Medical History:  ?Diagnosis Date  ? Anxiety   ? Arthritis   ? Chronic gastritis   ? Chronic thoracic spine pain   ? Depression   ? GERD (gastroesophageal reflux disease)   ? Hemorrhoids, external 10-21-11  ? occ. bothersome  ? History of colon polyps   ? Hyperlipidemia   ? Hypertension   ? Iron deficiency anemia   ? OSA on CPAP   ? study in epic 08-29-2017 severe osa (AHI 32.5), per pt daughter uses nightly  ? PMB (postmenopausal bleeding)   ? PONV (postoperative nausea and vomiting)   ? Pre-diabetes   ? ? ?Past Surgical History:  ?Procedure Laterality Date  ? [place]  ? CHOLECYSTECTOMY  10/27/2011  ? Procedure: LAPAROSCOPIC CHOLECYSTECTOMY WITH INTRAOPERATIVE CHOLANGIOGRAM;  Surgeon: Adin Hector, MD;  Location: [facility];  Service: General;  Laterality: N/A;  Laparoscopic Chole w/ IOC Single Site  ? COLONOSCOPY WITH PROPOFOL  last one 05-09-2018  ? DILATATION & CURETTAGE/HYSTEROSCOPY WITH MYOSURE N/A 03/11/2020  ? Procedure: DILATATION AND CURETTAGE /HYSTEROSCOPY WITH MYOSURE;  Surgeon: Mora Bellman, MD;  Location: [place];  Service: Gynecology;  Laterality: N/A;  ? DILATATION & CURETTAGE/HYSTEROSCOPY WITH MYOSURE N/A 02/02/2021  ? Procedure: DILATATION & CURETTAGE/HYSTEROSCOPY WITH MYOSURE POLYPECTOMY;  Surgeon: Mora Bellman, MD;  Location: [place];  Service: Gynecology;  Laterality: N/A;  ? ESOPHAGOGASTRODUODENOSCOPY (EGD) WITH PROPOFOL  last one 12-30-2019  dr Loletha Carrow  ? EYE SURGERY  10/21/2011  ? bil. for tissue growth-laser surgery  ? HARDWARE REMOVAL Left 03/16/2016  ? Procedure: LEFT ANKLE HARDWARE REMOVAL;  Surgeon: Leandrew Koyanagi, MD;  Location: [place];  Service: Orthopedics;  Laterality: Left;  ? MM BREAST STEREO BIOPSY LEFT ([place] HX) Left 2015  ? ORIF ANKLE FRACTURE Left 11/13/2015  ? Procedure: OPEN REDUCTION INTERNAL FIXATION (ORIF) LEFT BIMALLEOLAR ANKLE FRACTURE;  Surgeon: Leandrew Koyanagi, MD;  Location: [place];  Service: Orthopedics;  Laterality: Left;  ? ? ?Family History  ?Problem Relation Age of Onset  ? Hypertension Mother   ? Cancer Mother   ? Hypertension Father   ? Arthritis Brother   ? Hypertension Brother   ? Intellectual disability Paternal Aunt   ? Colon cancer Neg Hx   ? ? ?Social History  ? ?Socioeconomic History  ? Marital status: Single  ?  Spouse name: Not on file  ? Number of children: 2  ? Years of education: Not on file  ? Highest education level: 4th grade  ?Occupational History  ?  Occupation: unemployed  ?Tobacco Use  ? Smoking status: Former  ?  Packs/day: 0.25  ?  Years: 10.00  ?  Pack years: 2.50  ?  Types: Cigarettes  ?  Quit date: 2008  ?  Years since quitting: 15.3  ? Smokeless tobacco: Never  ?Vaping Use  ? Vaping Use: Never used  ?Substance and Sexual Activity  ? Alcohol use: No  ? Drug use: Never  ? Sexual activity: Not Currently  ?  Birth control/protection: Post-menopausal  ?Other Topics Concern  ? Not on file  ?Social History Narrative  ? Not on file  ? ?Social Determinants of Health  ? ?Financial Resource  Strain: Not on file  ?Food Insecurity: Not on file  ?Transportation Needs: Not on file  ?Physical Activity: Not on file  ?Stress: Not on file  ?Social Connections: Not on file  ? ? ?Allergies  ?Allergen Reactions  ? Hydrocodone Nausea And Vomiting  ? Tramadol Nausea Only  ? Oxycodone Rash  ? ? ?Outpatient Medications Prior to Visit  ?Medication Sig Dispense Refill  ? albuterol (VENTOLIN HFA) 108 (90 Base) MCG/ACT inhaler Inhale 2 puffs into the lungs every 6 (six) hours as needed for wheezing or shortness of breath. 8.5 g 0  ? atorvastatin (LIPITOR) 20 MG tablet TAKE 1 TABLET (20 MG TOTAL) BY MOUTH DAILY. 90 tablet 1  ? fluticasone (FLONASE) 50 MCG/ACT nasal spray Place 2 sprays into both nostrils daily. 16 g 6  ? hydrocortisone-pramoxine (ANALPRAM HC) 2.5-1 % rectal cream Place 1 application rectally 3 (three) times daily. 30 g 2  ? linaclotide (LINZESS) 72 MCG capsule Take 1 capsule (72 mcg total) by mouth daily before breakfast. 30 capsule 1  ? meloxicam (MOBIC) 7.5 MG tablet TAKE 1 TABLET (7.5 MG TOTAL) BY MOUTH DAILY. 90 tablet 1  ? Multiple Vitamin (MULTIVITAMIN WITH MINERALS) TABS tablet Take 1 tablet by mouth daily.    ? omeprazole (PRILOSEC) 40 MG capsule TOME 1 PASTILLA POR VIA ORAL DOS VECES AL DIA A REDUCIR EL ACIDO DEL ESTOMAGO 90 capsule 1  ? polyethylene glycol powder (GLYCOLAX/MIRALAX) 17 GM/SCOOP powder Take 17 g by mouth daily. As needed for constipation relief 3350 g 2  ? Probiotic Product (PROBIOTIC PO) Take by mouth daily.    ? psyllium (METAMUCIL) 58.6 % powder Take 1 packet by mouth as needed. 2 x weekly    ? UNABLE TO FIND Calcium, magnesium & zinc (all in 1), once daily    ? gabapentin (NEURONTIN) 100 MG capsule TAKE 1 CAPSULE BY MOUTH 2 TIMES DAILY. 90 capsule 0  ? valsartan (DIOVAN) 40 MG tablet Take 1 tablet (40 mg total) by mouth daily. 60 tablet 3  ? oxyCODONE-acetaminophen (PERCOCET/ROXICET) 5-325 MG tablet Take 1 tablet by mouth every 4 (four) hours as needed. (Patient not taking:  Reported on 02/21/2022) 15 tablet 0  ? azithromycin (ZITHROMAX) 250 MG tablet Take two tabs by mouth once day 1 , then take one tab daily x 4 days until gone (Patient not taking: Reported on 02/18/2022) 6 tablet 0  ? benzonatate (TESSALON) 100 MG capsule Take 1 capsule (100 mg total) by mouth 2 (two) times daily as needed for cough. (Patient not taking: Reported on 02/21/2022) 40 capsule 0  ? clotrimazole-betamethasone (LOTRISONE) cream Apply 1 application topically 2 (two) times daily. As needed for skin irritation (Patient not taking: Reported on 02/18/2022) 30 g 2  ? predniSONE (DELTASONE) 10 MG tablet Take 4 tablets daily for 5 days then stop (Patient not  taking: Reported on 02/18/2022) 20 tablet 0  ? ?No facility-administered medications prior to visit.  ? ? ? ?ROS ?Review of Systems  ?Constitutional:  Negative for activity change, appetite change and fatigue.  ?HENT:  Negative for congestion, sinus pressure and sore throat.   ?Eyes:  Negative for visual disturbance.  ?Respiratory:  Negative for cough, chest tightness, shortness of breath and wheezing.   ?Cardiovascular:  Negative for chest pain and palpitations.  ?Gastrointestinal:  Negative for abdominal distention, abdominal pain and constipation.  ?Endocrine: Negative for polydipsia.  ?Genitourinary:  Negative for dysuria and frequency.  ?Musculoskeletal:  Negative for arthralgias and back pain.  ?Skin:  Negative for rash.  ?Neurological:  Negative for tremors, light-headedness and numbness.  ?Hematological:  Does not bruise/bleed easily.  ?Psychiatric/Behavioral:  Negative for agitation and behavioral problems.   ? ?Objective:  ?BP 132/81   Pulse 80   Ht '4\' 11"'$  (1.499 m)   Wt 176 lb 9.6 oz (80.1 kg)   LMP 10/08/2015 (Exact Date)   SpO2 96%   BMI 35.67 kg/m?  ? ? ?  02/21/2022  ?  9:19 AM 02/18/2022  ?  2:57 PM 01/13/2022  ? 10:14 AM  ?BP/Weight  ?Systolic BP 253 664 403  ?Diastolic BP 81 66 75  ?Wt. (Lbs) 176.6 174.6 175.2  ?BMI 35.67 kg/m2 35.26 kg/m2 35.39  kg/m2  ? ? ? ? ?Physical Exam ?Constitutional:   ?   Appearance: She is well-developed.  ?Cardiovascular:  ?   Rate and Rhythm: Normal rate.  ?   Heart sounds: Normal heart sounds. No murmur heard. ?Pulmonary:

## 2022-02-23 ENCOUNTER — Telehealth: Payer: Self-pay | Admitting: Primary Care

## 2022-02-23 NOTE — Telephone Encounter (Signed)
Patient and her daughter came into the office today. She stated that they took the machine to [facility] and they were able to get a download from her machine. However, they did not provide her with a SD card.  ? ?She had her cpap machine with her. She has a Copy or S9 device which does not support a SD card. It has its own separate chip that we do not have the equipment in office to read.  ? ?Per patient, she has had the machine for about 4 years.  ? ?I have placed a copy of the download in B Pod for Beth to review.  ?

## 2022-02-23 NOTE — Telephone Encounter (Signed)
Ok, I will review. She will just need to bring machine in to [facility] when needed for compliance download. She can get a new machine at 5 years

## 2022-02-28 ENCOUNTER — Ambulatory Visit: Payer: No Typology Code available for payment source | Admitting: Critical Care Medicine

## 2022-03-30 ENCOUNTER — Encounter: Payer: No Typology Code available for payment source | Admitting: Family Medicine

## 2022-04-02 ENCOUNTER — Encounter (HOSPITAL_COMMUNITY): Payer: Self-pay

## 2022-04-02 ENCOUNTER — Ambulatory Visit (HOSPITAL_COMMUNITY)
Admission: EM | Admit: 2022-04-02 | Discharge: 2022-04-02 | Disposition: A | Discharge status: Home / Self Care | Payer: No Typology Code available for payment source

## 2022-04-02 DIAGNOSIS — S30860A Insect bite (nonvenomous) of lower back and pelvis, initial encounter: Secondary | ICD-10-CM

## 2022-04-02 DIAGNOSIS — W57XXXA Bitten or stung by nonvenomous insect and other nonvenomous arthropods, initial encounter: Secondary | ICD-10-CM

## 2022-04-02 MED ORDER — LIDOCAINE-EPINEPHRINE 1 %-1:100000 IJ SOLN
INTRAMUSCULAR | Status: AC
  Filled 2022-04-02: qty 1

## 2022-04-02 NOTE — ED Triage Notes (Signed)
C/o tick bite on lower back x 2-3 days.

## 2022-04-02 NOTE — Discharge Instructions (Addendum)
Tick mouthparts removed in office. Use Vaseline over area and keep covered with Band-Aid until healed. Monitor for any signs of rash, fever, headache, body aches.

## 2022-04-02 NOTE — ED Provider Notes (Signed)
[place]   MRN: [id number] DOB: 03-05-1961  Subjective:   Susan Lopez is a 61 y.o. female presenting for complaints of tick bite R mid back. Her daughter is here to help with translation.  Daughter reports that she was able to remove the tick with tweezers approximately 2 days ago, but the head was not present when she pulled the tick off.  The tick was flat with a white spot on its back.  Patient has been very concerned about tickborne illness with the mouthparts possibly still being present and would like this area removed.  She denies any fever or chills.  No headache or dizziness.  No joint pain.  No chest pain or shortness of breath no other symptoms today.  No current facility-administered medications for this encounter.  Current Outpatient Medications:    albuterol (VENTOLIN HFA) 108 (90 Base) MCG/ACT inhaler, Inhale 2 puffs into the lungs every 6 (six) hours as needed for wheezing or shortness of breath., Disp: 8.5 g, Rfl: 0   atorvastatin (LIPITOR) 20 MG tablet, TAKE 1 TABLET (20 MG TOTAL) BY MOUTH DAILY., Disp: 90 tablet, Rfl: 1   fluticasone (FLONASE) 50 MCG/ACT nasal spray, Place 2 sprays into both nostrils daily., Disp: 16 g, Rfl: 6   gabapentin (NEURONTIN) 100 MG capsule, TAKE 1 CAPSULE BY MOUTH 2 TIMES DAILY., Disp: 180 capsule, Rfl: 1   hydrocortisone-pramoxine (ANALPRAM HC) 2.5-1 % rectal cream, Place 1 application rectally 3 (three) times daily., Disp: 30 g, Rfl: 2   linaclotide (LINZESS) 72 MCG capsule, Take 1 capsule (72 mcg total) by mouth daily before breakfast., Disp: 30 capsule, Rfl: 1   lisinopril (ZESTRIL) 10 MG tablet, Take 1 tablet (10 mg total) by mouth daily., Disp: 90 tablet, Rfl: 1   meloxicam (MOBIC) 7.5 MG tablet, TAKE 1 TABLET (7.5 MG TOTAL) BY MOUTH DAILY., Disp: 90 tablet, Rfl: 1   Multiple Vitamin (MULTIVITAMIN WITH MINERALS) TABS tablet, Take 1 tablet by mouth daily., Disp: , Rfl:    omeprazole (PRILOSEC) 40 MG capsule,  TOME 1 PASTILLA POR VIA ORAL DOS VECES AL DIA A REDUCIR EL ACIDO DEL ESTOMAGO, Disp: 90 capsule, Rfl: 1   oxyCODONE-acetaminophen (PERCOCET/ROXICET) 5-325 MG tablet, Take 1 tablet by mouth every 4 (four) hours as needed. (Patient not taking: Reported on 02/21/2022), Disp: 15 tablet, Rfl: 0   polyethylene glycol powder (GLYCOLAX/MIRALAX) 17 GM/SCOOP powder, Take 17 g by mouth daily. As needed for constipation relief, Disp: 3350 g, Rfl: 2   Probiotic Product (PROBIOTIC PO), Take by mouth daily., Disp: , Rfl:    psyllium (METAMUCIL) 58.6 % powder, Take 1 packet by mouth as needed. 2 x weekly, Disp: , Rfl:    UNABLE TO FIND, Calcium, magnesium & zinc (all in 1), once daily, Disp: , Rfl:    Allergies  Allergen Reactions   Hydrocodone Nausea And Vomiting   Tramadol Nausea Only   Oxycodone Rash    Past Medical History:  Diagnosis Date   Anxiety    Arthritis    Chronic gastritis    Chronic thoracic spine pain    Depression    GERD (gastroesophageal reflux disease)    Hemorrhoids, external 10-21-11   occ. bothersome   History of colon polyps    Hyperlipidemia    Hypertension    Iron deficiency anemia    OSA on CPAP    study in epic 08-29-2017 severe osa (AHI 32.5), per pt daughter uses nightly   PMB (postmenopausal bleeding)  PONV (postoperative nausea and vomiting)    Pre-diabetes      Past Surgical History:  Procedure Laterality Date   [place], 1992   CHOLECYSTECTOMY  10/27/2011   Procedure: LAPAROSCOPIC CHOLECYSTECTOMY WITH INTRAOPERATIVE CHOLANGIOGRAM;  Surgeon: Adin Hector, MD;  Location: [facility];  Service: General;  Laterality: N/A;  Laparoscopic Chole w/ IOC Single Site   COLONOSCOPY WITH PROPOFOL  last one 05-09-2018   DILATATION & CURETTAGE/HYSTEROSCOPY WITH MYOSURE N/A 03/11/2020   Procedure: DILATATION AND CURETTAGE /HYSTEROSCOPY WITH MYOSURE;  Surgeon: Mora Bellman, MD;  Location: [place];  Service: Gynecology;  Laterality: N/A;    DILATATION & CURETTAGE/HYSTEROSCOPY WITH MYOSURE N/A 02/02/2021   Procedure: DILATATION & CURETTAGE/HYSTEROSCOPY WITH MYOSURE POLYPECTOMY;  Surgeon: Mora Bellman, MD;  Location: [place];  Service: Gynecology;  Laterality: N/A;   ESOPHAGOGASTRODUODENOSCOPY (EGD) WITH PROPOFOL  last one 12-30-2019  dr Loletha Carrow   EYE SURGERY  10/21/2011   bil. for tissue growth-laser surgery   HARDWARE REMOVAL Left 03/16/2016   Procedure: LEFT ANKLE HARDWARE REMOVAL;  Surgeon: Leandrew Koyanagi, MD;  Location: [facility];  Service: Orthopedics;  Laterality: Left;   MM BREAST STEREO BIOPSY LEFT ([facility] HX) Left 2015   ORIF ANKLE FRACTURE Left 11/13/2015   Procedure: OPEN REDUCTION INTERNAL FIXATION (ORIF) LEFT BIMALLEOLAR ANKLE FRACTURE;  Surgeon: Leandrew Koyanagi, MD;  Location: [facility];  Service: Orthopedics;  Laterality: Left;    Family History  Problem Relation Age of Onset   Hypertension Mother    Cancer Mother    Hypertension Father    Arthritis Brother    Hypertension Brother    Intellectual disability Paternal Aunt    Colon cancer Neg Hx     Social History   Tobacco Use   Smoking status: Former    Packs/day: 0.25    Years: 10.00    Pack years: 2.50    Types: Cigarettes    Quit date: 2008    Years since quitting: 15.4   Smokeless tobacco: Never  Vaping Use   Vaping Use: Never used  Substance Use Topics   Alcohol use: No   Drug use: Never    ROS REFER TO HPI FOR PERTINENT POSITIVES AND NEGATIVES   Objective:   Vitals: BP 136/72 (BP Location: Left Arm)   Pulse 75   Temp 98.4 F (36.9 C) (Oral)   Resp 16   LMP 10/08/2015 (Exact Date)   SpO2 100%   Physical Exam Constitutional:      General: She is not in acute distress.    Appearance: Normal appearance. She is not ill-appearing.  Cardiovascular:     Rate and Rhythm: Normal rate and regular rhythm.  Pulmonary:     Effort: Pulmonary effort is normal.     Breath sounds: Normal breath sounds.  Skin:       Neurological:     Mental Status: She is alert.  Psychiatric:        Mood and Affect: Mood normal.    No results found for this or any previous visit (from the past 24 hour(s)).  Assessment and Plan :   PDMP not reviewed this encounter.  1. Tick bite of back, initial encounter   Patient has the tick body in a zip-lock bag with her today. Flat, small tick with white spot on back. Informed patient this is probably what is called a [place] tick.  May need to monitor for alpha-gal symptoms in the future. Patient and  daughter both requesting area of skin to be removed to try to remove any remaining mouthparts. Did not feel antibiotics were necessary at this time as tick is flat, not-engorged & asymptomatic patient. AVS provided.   PROCEDURE: Verbal consent obtained from the patient.  Area cleaned in usual manner with alcohol pads.  Local anesthesia with 2 cc of 1% lidocaine with epinephrine.  Used an 11 blade to remove a circumferential area around noted area of tick bite.  Used hemostats to remove this area of skin.  Bleeding well controlled.  Patient tolerated procedure well.  Band-Aid applied and aftercare instructions provided.  Kailyn Dubie, PA-C        Rontrell Moquin, Randa Evens, PA-C 04/02/22 1319

## 2022-04-07 ENCOUNTER — Other Ambulatory Visit (HOSPITAL_COMMUNITY)
Admission: RE | Admit: 2022-04-07 | Discharge: 2022-04-07 | Disposition: A | Discharge status: Home / Self Care | Payer: Self-pay | Source: Ambulatory Visit | Attending: Family Medicine | Admitting: Family Medicine

## 2022-04-07 ENCOUNTER — Encounter: Payer: Self-pay | Admitting: Family Medicine

## 2022-04-07 ENCOUNTER — Ambulatory Visit: Payer: Self-pay | Attending: Family Medicine | Admitting: Family Medicine

## 2022-04-07 VITALS — BP 129/79 | HR 77 | Temp 98.1°F | Ht 59.0 in | Wt 177.6 lb

## 2022-04-07 DIAGNOSIS — Z124 Encounter for screening for malignant neoplasm of cervix: Secondary | ICD-10-CM

## 2022-04-07 DIAGNOSIS — Z1231 Encounter for screening mammogram for malignant neoplasm of breast: Secondary | ICD-10-CM

## 2022-04-07 DIAGNOSIS — Z0000 Encounter for general adult medical examination without abnormal findings: Secondary | ICD-10-CM

## 2022-04-07 DIAGNOSIS — Z0001 Encounter for general adult medical examination with abnormal findings: Secondary | ICD-10-CM

## 2022-04-07 DIAGNOSIS — Z1159 Encounter for screening for other viral diseases: Secondary | ICD-10-CM

## 2022-04-07 DIAGNOSIS — N644 Mastodynia: Secondary | ICD-10-CM

## 2022-04-07 NOTE — Progress Notes (Signed)
CPE Having vaginal discomfort.

## 2022-04-07 NOTE — Patient Instructions (Signed)

## 2022-04-07 NOTE — Progress Notes (Signed)
Subjective:  Patient ID: Susan Lopez, female    DOB: 1961/03/20  Age: 61 y.o. MRN: [id number]  CC: Annual Exam and Gynecologic Exam   HPI Louanna Vanliew Laurian Brim is a 61 y.o. year old female with a history of hypertension, hyperlipidemia, history of D&C/hysteroscopy and MyoSure polypectomy in 01/2021 who presents today for an annual physical exam  Interval History: Healthcare maintenance: PAP smear : ASCUS, HPV on 11/26/2020 - repeat in 1 year recommended by GYN Mammogram: No evidence of malignancy from 01/2021 Colonoscopy: 04/2018 two sessile polyps retrieved, pathology tubular adenoma with no high-grade dysplasia; repeat due in 04/2013  Diet: Adherence to low-sodium diet Exercise: Walks regularly  She complains of pain in her L breast x1 year which is intermittent but denies presence of breast lumps. Past Medical History:  Diagnosis Date   Anxiety    Arthritis    Chronic gastritis    Chronic thoracic spine pain    Depression    GERD (gastroesophageal reflux disease)    Hemorrhoids, external 10-21-11   occ. bothersome   History of colon polyps    Hyperlipidemia    Hypertension    Iron deficiency anemia    OSA on CPAP    study in epic 08-29-2017 severe osa (AHI 32.5), per pt daughter uses nightly   PMB (postmenopausal bleeding)    PONV (postoperative nausea and vomiting)    Pre-diabetes     Past Surgical History:  Procedure Laterality Date   [place]  10/27/2011   Procedure: LAPAROSCOPIC CHOLECYSTECTOMY WITH INTRAOPERATIVE CHOLANGIOGRAM;  Surgeon: Adin Hector, MD;  Location: [facility];  Service: General;  Laterality: N/A;  Laparoscopic Chole w/ IOC Single Site   COLONOSCOPY WITH PROPOFOL  last one 05-09-2018   DILATATION & CURETTAGE/HYSTEROSCOPY WITH MYOSURE N/A 03/11/2020   Procedure: DILATATION AND CURETTAGE /HYSTEROSCOPY WITH MYOSURE;  Surgeon: Mora Bellman, MD;  Location: [place];  Service:  Gynecology;  Laterality: N/A;   DILATATION & CURETTAGE/HYSTEROSCOPY WITH MYOSURE N/A 02/02/2021   Procedure: DILATATION & CURETTAGE/HYSTEROSCOPY WITH MYOSURE POLYPECTOMY;  Surgeon: Mora Bellman, MD;  Location: [place];  Service: Gynecology;  Laterality: N/A;   ESOPHAGOGASTRODUODENOSCOPY (EGD) WITH PROPOFOL  last one 12-30-2019  dr Loletha Carrow   EYE SURGERY  10/21/2011   bil. for tissue growth-laser surgery   HARDWARE REMOVAL Left 03/16/2016   Procedure: LEFT ANKLE HARDWARE REMOVAL;  Surgeon: Leandrew Koyanagi, MD;  Location: [facility];  Service: Orthopedics;  Laterality: Left;   MM BREAST STEREO BIOPSY LEFT ([facility] HX) Left 2015   ORIF ANKLE FRACTURE Left 11/13/2015   Procedure: OPEN REDUCTION INTERNAL FIXATION (ORIF) LEFT BIMALLEOLAR ANKLE FRACTURE;  Surgeon: Leandrew Koyanagi, MD;  Location: [facility];  Service: Orthopedics;  Laterality: Left;    Family History  Problem Relation Age of Onset   Hypertension Mother    Cancer Mother    Hypertension Father    Arthritis Brother    Hypertension Brother    Intellectual disability Paternal Aunt    Colon cancer Neg Hx     Social History   Socioeconomic History   Marital status: Single    Spouse name: Not on file   Number of children: 2   Years of education: Not on file   Highest education level: 4th grade  Occupational History   Occupation: unemployed  Tobacco Use   Smoking status: Former    Packs/day: 0.25    Years: 10.00    Total pack  years: 2.50    Types: Cigarettes    Quit date: 2008    Years since quitting: 15.4   Smokeless tobacco: Never  Vaping Use   Vaping Use: Never used  Substance and Sexual Activity   Alcohol use: No   Drug use: Never   Sexual activity: Not Currently    Birth control/protection: Post-menopausal  Other Topics Concern   Not on file  Social History Narrative   Not on file   Social Determinants of Health   Financial Resource Strain: Not on file  Food Insecurity: Not on file   Transportation Needs: Unmet Transportation Needs (02/04/2021)   PRAPARE - Transportation    Lack of Transportation (Medical): Yes    Lack of Transportation (Non-Medical): Yes  Physical Activity: Insufficiently Active (09/28/2017)   Exercise Vital Sign    Days of Exercise per Week: 7 days    Minutes of Exercise per Session: 20 min  Stress: Not on file  Social Connections: Somewhat Isolated (09/28/2017)   Social Connection and Isolation Panel [NHANES]    Frequency of Communication with Friends and Family: More than three times a week    Frequency of Social Gatherings with Friends and Family: Once a week    Attends Religious Services: More than 4 times per year    Active Member of [employer] or Organizations: No    Attends Archivist Meetings: Never    Marital Status: Separated    Allergies  Allergen Reactions   Hydrocodone Nausea And Vomiting   Tramadol Nausea Only   Oxycodone Rash    Outpatient Medications Prior to Visit  Medication Sig Dispense Refill   albuterol (VENTOLIN HFA) 108 (90 Base) MCG/ACT inhaler Inhale 2 puffs into the lungs every 6 (six) hours as needed for wheezing or shortness of breath. 8.5 g 0   atorvastatin (LIPITOR) 20 MG tablet TAKE 1 TABLET (20 MG TOTAL) BY MOUTH DAILY. 90 tablet 1   fluticasone (FLONASE) 50 MCG/ACT nasal spray Place 2 sprays into both nostrils daily. 16 g 6   gabapentin (NEURONTIN) 100 MG capsule TAKE 1 CAPSULE BY MOUTH 2 TIMES DAILY. 180 capsule 1   hydrocortisone-pramoxine (ANALPRAM HC) 2.5-1 % rectal cream Place 1 application rectally 3 (three) times daily. 30 g 2   linaclotide (LINZESS) 72 MCG capsule Take 1 capsule (72 mcg total) by mouth daily before breakfast. 30 capsule 1   lisinopril (ZESTRIL) 10 MG tablet Take 1 tablet (10 mg total) by mouth daily. 90 tablet 1   meloxicam (MOBIC) 7.5 MG tablet TAKE 1 TABLET (7.5 MG TOTAL) BY MOUTH DAILY. 90 tablet 1   Multiple Vitamin (MULTIVITAMIN WITH MINERALS) TABS tablet Take 1 tablet by  mouth daily.     omeprazole (PRILOSEC) 40 MG capsule TOME 1 PASTILLA POR VIA ORAL DOS VECES AL DIA A REDUCIR EL ACIDO DEL ESTOMAGO 90 capsule 1   polyethylene glycol powder (GLYCOLAX/MIRALAX) 17 GM/SCOOP powder Take 17 g by mouth daily. As needed for constipation relief 3350 g 2   Probiotic Product (PROBIOTIC PO) Take by mouth daily.     psyllium (METAMUCIL) 58.6 % powder Take 1 packet by mouth as needed. 2 x weekly     UNABLE TO FIND Calcium, magnesium & zinc (all in 1), once daily     oxyCODONE-acetaminophen (PERCOCET/ROXICET) 5-325 MG tablet Take 1 tablet by mouth every 4 (four) hours as needed. (Patient not taking: Reported on 02/21/2022) 15 tablet 0   No facility-administered medications prior to visit.     ROS Review  of Systems  Constitutional:  Negative for activity change, appetite change and fatigue.  HENT:  Negative for congestion, sinus pressure and sore throat.   Eyes:  Negative for visual disturbance.  Respiratory:  Negative for cough, chest tightness, shortness of breath and wheezing.   Cardiovascular:  Negative for chest pain and palpitations.  Gastrointestinal:  Negative for abdominal distention, abdominal pain and constipation.  Endocrine: Negative for polydipsia.  Genitourinary:  Negative for dysuria and frequency.  Musculoskeletal:  Negative for arthralgias and back pain.  Skin:  Negative for rash.  Neurological:  Negative for tremors, light-headedness and numbness.  Hematological:  Does not bruise/bleed easily.  Psychiatric/Behavioral:  Negative for agitation and behavioral problems.     Objective:  BP 129/79   Pulse 77   Temp 98.1 F (36.7 C) (Oral)   Ht '4\' 11"'$  (1.499 m)   Wt 177 lb 9.6 oz (80.6 kg)   LMP 10/08/2015 (Exact Date)   SpO2 96%   BMI 35.87 kg/m      04/07/2022   10:15 AM 04/02/2022   12:14 PM 02/21/2022    9:19 AM  BP/Weight  Systolic BP 852 778 242  Diastolic BP 79 72 81  Wt. (Lbs) 177.6  176.6  BMI 35.87 kg/m2  35.67 kg/m2       Physical Exam Exam conducted with a chaperone present.  Constitutional:      General: She is not in acute distress.    Appearance: She is well-developed. She is not diaphoretic.  HENT:     Head: Normocephalic.     Right Ear: External ear normal.     Left Ear: External ear normal.     Nose: Nose normal.  Eyes:     Conjunctiva/sclera: Conjunctivae normal.     Pupils: Pupils are equal, round, and reactive to light.  Neck:     Vascular: No JVD.  Cardiovascular:     Rate and Rhythm: Normal rate and regular rhythm.     Heart sounds: Normal heart sounds. No murmur heard.    No gallop.  Pulmonary:     Effort: Pulmonary effort is normal. No respiratory distress.     Breath sounds: Normal breath sounds. No wheezing or rales.  Chest:     Chest wall: No tenderness.  Breasts:    Right: Normal. No mass, nipple discharge or tenderness.     Left: Tenderness (lateral aspect) present. No mass or nipple discharge.  Abdominal:     General: Bowel sounds are normal. There is no distension.     Palpations: Abdomen is soft. There is no mass.     Tenderness: There is no abdominal tenderness.     Hernia: There is no hernia in the left inguinal area or right inguinal area.  Genitourinary:    General: Normal vulva.     Pubic Area: No rash.      Labia:        Right: No rash.        Left: No rash.      Vagina: Normal.     Cervix: Normal.     Uterus: Normal.      Adnexa: Right adnexa normal and left adnexa normal.       Right: No tenderness.         Left: No tenderness.    Musculoskeletal:        General: No tenderness. Normal range of motion.     Cervical back: Normal range of motion. No tenderness.  Lymphadenopathy:  Upper Body:     Right upper body: No supraclavicular or axillary adenopathy.     Left upper body: No supraclavicular or axillary adenopathy.  Skin:    General: Skin is warm and dry.  Neurological:     Mental Status: She is alert and oriented to person, place, and  time.     Deep Tendon Reflexes: Reflexes are normal and symmetric.        Latest Ref Rng & Units 01/13/2022   11:10 AM 02/02/2021   10:28 AM 10/05/2020    4:08 PM  CMP  Glucose 70 - 99 mg/dL 115  109  132   BUN 8 - 27 mg/dL '9  10  10   '$ Creatinine 0.57 - 1.00 mg/dL 0.56  0.40  0.71   Sodium 134 - 144 mmol/L 141  143  142   Potassium 3.5 - 5.2 mmol/L 4.5  3.8  3.9   Chloride 96 - 106 mmol/L 103  104  109   CO2 20 - 29 mmol/L 25   18   Calcium 8.7 - 10.3 mg/dL 9.6   8.9   Total Protein 6.0 - 8.5 g/dL 7.4   6.6   Total Bilirubin 0.0 - 1.2 mg/dL 0.6   0.3   Alkaline Phos 44 - 121 IU/L 148   110   AST 0 - 40 IU/L 19   13   ALT 0 - 32 IU/L 22   22     Lipid Panel     Component Value Date/Time   CHOL 146 01/13/2022 1110   TRIG 127 01/13/2022 1110   HDL 46 01/13/2022 1110   CHOLHDL 3.8 12/13/2017 1150   CHOLHDL 5.3 (H) 11/23/2016 0952   VLDL 61 (H) 11/23/2016 0952   LDLCALC 77 01/13/2022 1110    CBC    Component Value Date/Time   WBC 6.8 01/13/2022 1110   WBC 7.0 03/09/2020 1017   RBC 4.64 01/13/2022 1110   RBC 4.70 03/09/2020 1017   HGB 14.6 01/13/2022 1110   HGB 14.9 10/08/2013 0853   HCT 42.4 01/13/2022 1110   HCT 43.2 10/08/2013 0853   PLT 277 01/13/2022 1110   MCV 91 01/13/2022 1110   MCV 92.8 10/08/2013 0853   MCH 31.5 01/13/2022 1110   MCH 31.1 03/09/2020 1017   MCHC 34.4 01/13/2022 1110   MCHC 32.7 03/09/2020 1017   RDW 12.5 01/13/2022 1110   RDW 12.8 10/08/2013 0853   LYMPHSABS 2.2 01/13/2022 1110   LYMPHSABS 2.1 10/08/2013 0853   MONOABS 0.4 03/07/2018 1046   MONOABS 0.4 10/08/2013 0853   EOSABS 0.2 01/13/2022 1110   BASOSABS 0.1 01/13/2022 1110   BASOSABS 0.1 10/08/2013 0853    Lab Results  Component Value Date   HGBA1C 6.0 (H) 01/13/2022    Assessment & Plan:  1. Annual physical exam Counseled on 150 minutes of exercise per week, healthy eating (including decreased daily intake of saturated fats, cholesterol, added sugars, sodium), STI  prevention, routine healthcare maintenance.   2. Encounter for screening mammogram for malignant neoplasm of breast - MM DIAG BREAST TOMO BILATERAL; Future - US BREAST LTD UNI LEFT INC AXILLA; Future  3. Screening for cervical cancer - Cytology - PAP  4. Screening for viral disease - HIV Antibody (routine testing w rflx)  5. Mastodynia of left breast - MM DIAG BREAST TOMO BILATERAL; Future - US BREAST LTD UNI LEFT INC AXILLA; Future   No orders of the defined types were placed in this encounter.  Follow-up: Return in about 3 months (around 07/08/2022) for Chronic medical conditions.       Charlott Rakes, MD, FAAFP. [facility] and [place], [place]   04/07/2022, 10:41 AM

## 2022-04-08 ENCOUNTER — Telehealth: Payer: Self-pay | Admitting: *Deleted

## 2022-04-08 LAB — HIV ANTIBODY (ROUTINE TESTING W REFLEX): HIV Screen 4th Generation wRfx: NONREACTIVE

## 2022-04-08 NOTE — Telephone Encounter (Signed)
Called and spoke with the [facility] office and Danielle with [employer].  I was advised that she does not have her CPAP machine.  I called the patient using the Language Line, I verified that she has not received her CPAP machine.  Advised her that there is no reason to have the appointment since she has not received the CPAP machine.  She said she is not working and does not have insurance that will cover the CPAP machine.  Advised her to call us back when she is able to get the machine and in the mean time I will call [employer] and see if we can get her a refurbished machine.  She verbalized understanding.  Appointment cancelled.

## 2022-04-09 IMAGING — MR MR ABDOMEN WO/W CM
19 series · 48 of 48 positions shown · IV contrast (Gadavist)
Comparison: CT abdomen pelvis, 02/22/2021

CLINICAL DATA: Characterize right renal lesion, possible
hemorrhagic cyst, right-sided back pain

EXAM:
MRI ABDOMEN WITHOUT AND WITH CONTRAST
TECHNIQUE: Multiplanar multisequence MR imaging of the abdomen was performed
both before and after the administration of intravenous contrast.
CONTRAST:  8mL GADAVIST GADOBUTROL 1 MMOL/ML IV SOLN

[Series 4: cor haste · coronal · 6.0mm · 1.25mm/px · 2 of 36 slices shown]
[im 1/36]
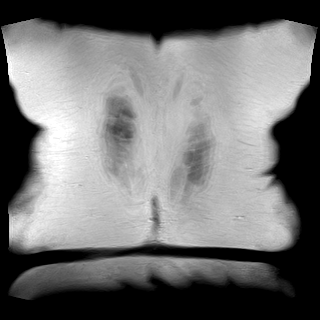
[im 36/36]
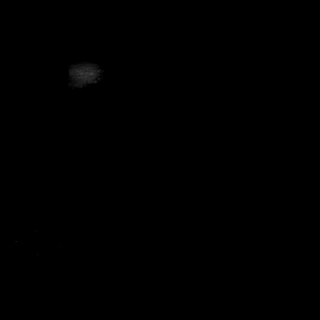

[Series 5: ax haste · axial · 6.0mm · 1.25mm/px · z∈[-34,-5]mm · 2 of 3 slices shown (1 of 2)]
[im 1/3]
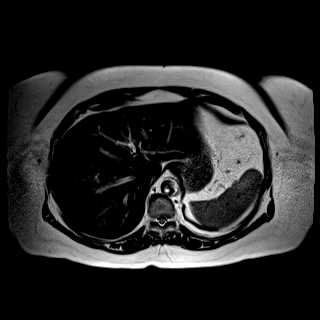
[im 3/3]
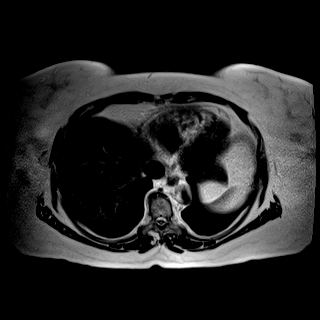

[Series 6: ax haste · axial · 6.0mm · 1.25mm/px · 1 of 39 slices shown (2 of 2)]
[im 1/39]
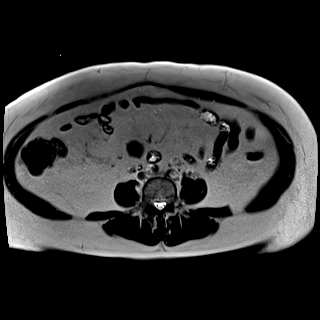

[Series 9: T2 fat-sat · axial · 6.0mm · 1.31mm/px · 1 of 35 slices shown]
[im 1/35]
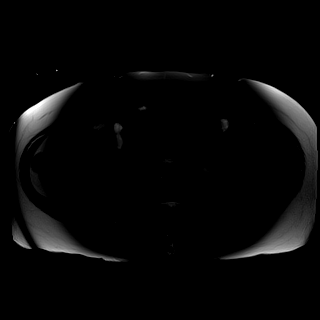

[Series 11: t1_vibe_opp-in_tra_p4_bh · axial · 3.0mm · 1.31mm/px · z∈[-229,+8]mm · 3 of 80 slices shown (1 of 2)]
[im 1/80]
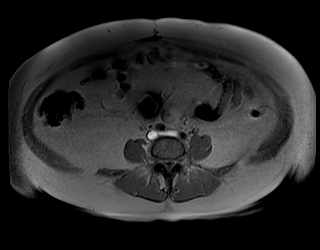
[im 40/80]
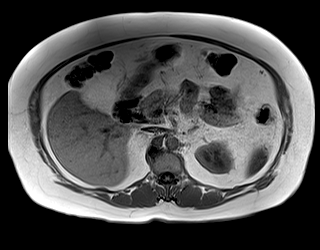
[im 80/80]
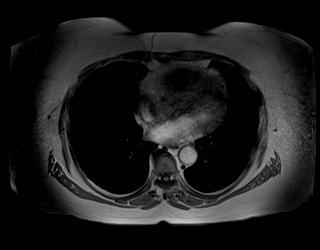

[Series 11: t1_vibe_opp-in_tra_p4_bh · axial · 3.0mm · 1.31mm/px · z∈[-229,+8]mm · 3 of 80 slices shown (2 of 2)]
[im 1/80]
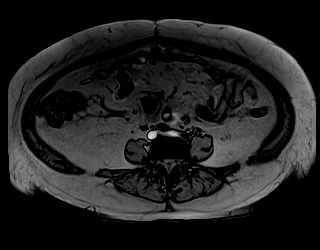
[im 40/80]
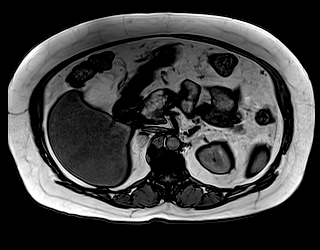
[im 80/80]
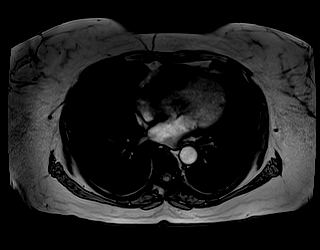

[Series 12: DWI · axial · 6.0mm · 1.57mm/px · z∈[-205,+47]mm · 4 of 108 slices shown (1 of 2)]
[im 1/108]
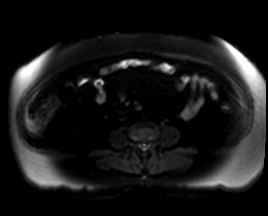
[im 36/108]
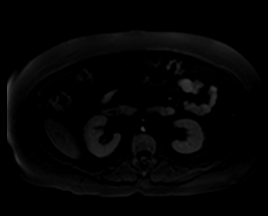
[im 72/108]
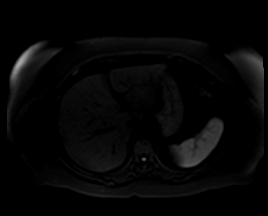
[im 108/108]
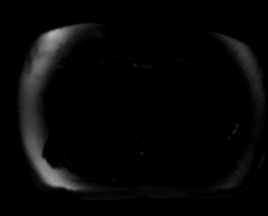

[Series 13: DWI · axial · 6.0mm · 1.57mm/px · 1 of 36 slices shown (2 of 2)]
[im 1/36]
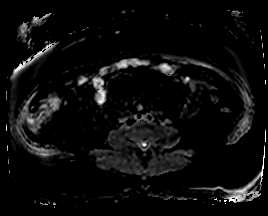

[Series 14: bSSFP · axial · 6.0mm · 0.82mm/px · 1 of 33 slices shown]
[im 1/33]
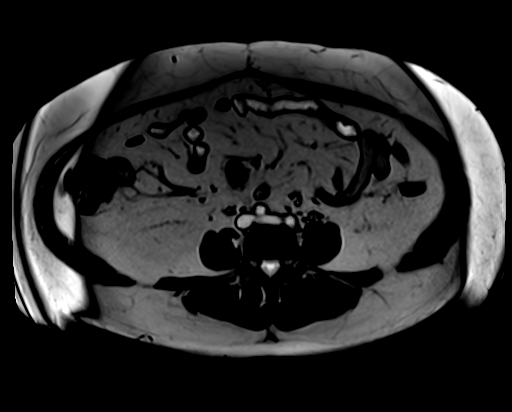

[Series 15: t1_vibe_fs_tra_p4_bh_pre · axial · 3.0mm · 1.31mm/px · z∈[-229,+8]mm · 3 of 80 slices shown]
[im 1/80]
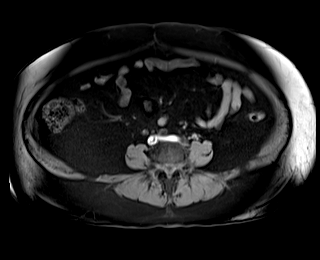
[im 40/80]
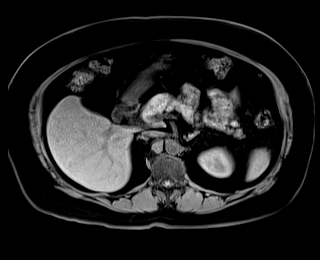
[im 80/80]
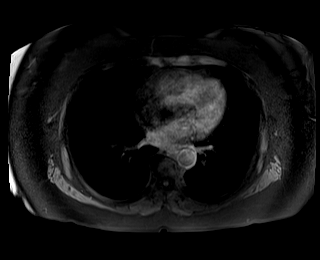

[Series 17: t1_vibe_fs_tra_p4_bh_post · axial · 3.0mm · 1.31mm/px · z∈[-229,+8]mm · 3 of 80 slices shown (1 of 4)]
[im 1/80]
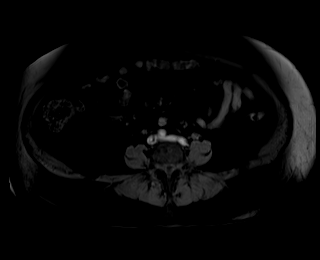
[im 40/80]
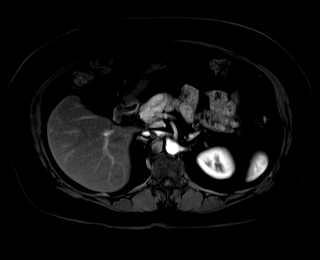
[im 80/80]
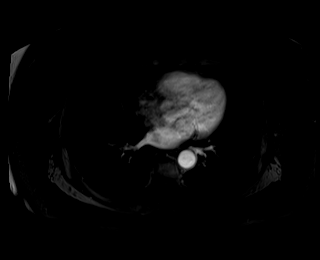

[Series 18: t1_vibe_fs_tra_p4_bh_post_sub · axial · 3.0mm · 1.31mm/px · z∈[-229,+8]mm · 3 of 80 slices shown (1 of 4)]
[im 1/80]
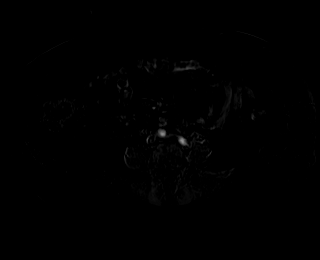
[im 40/80]
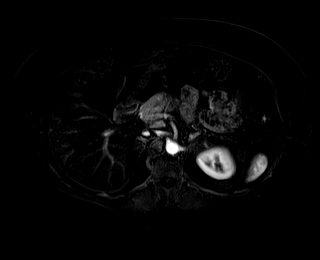
[im 80/80]
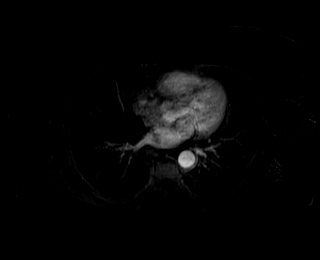

[Series 19: t1_vibe_fs_tra_p4_bh_post · axial · 3.0mm · 1.31mm/px · z∈[-229,+8]mm · 3 of 80 slices shown (2 of 4)]
[im 1/80]
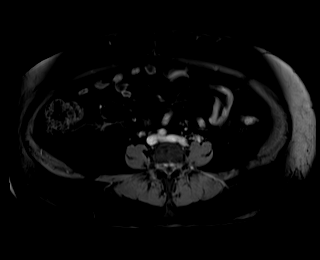
[im 40/80]
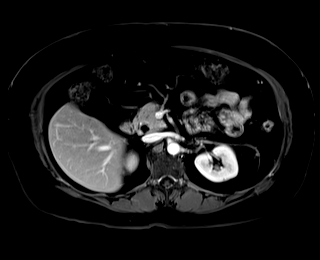
[im 80/80]
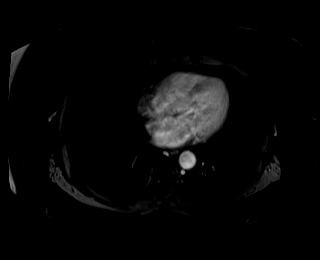

[Series 20: t1_vibe_fs_tra_p4_bh_post_sub · axial · 3.0mm · 1.31mm/px · z∈[-229,+8]mm · 3 of 80 slices shown (2 of 4)]
[im 1/80]
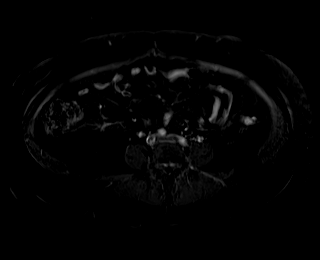
[im 40/80]
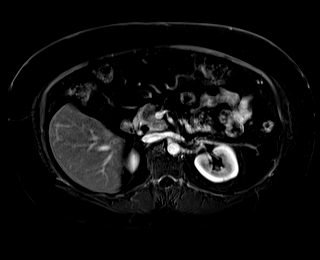
[im 80/80]
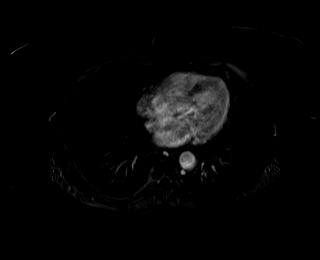

[Series 21: t1_vibe_fs_tra_p4_bh_post · axial · 3.0mm · 1.31mm/px · z∈[-229,+8]mm · 3 of 80 slices shown (3 of 4)]
[im 1/80]
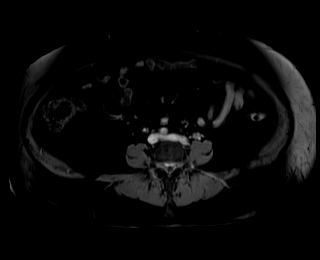
[im 40/80]
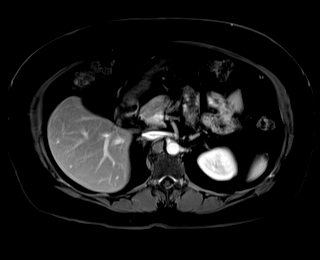
[im 80/80]
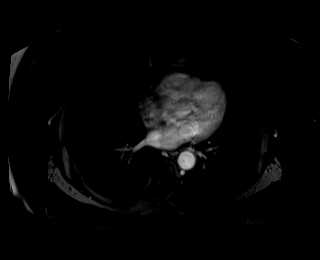

[Series 22: t1_vibe_fs_tra_p4_bh_post_sub · axial · 3.0mm · 1.31mm/px · z∈[-229,+8]mm · 3 of 80 slices shown (3 of 4)]
[im 1/80]
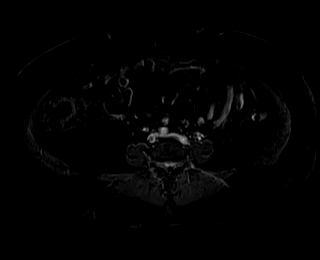
[im 40/80]
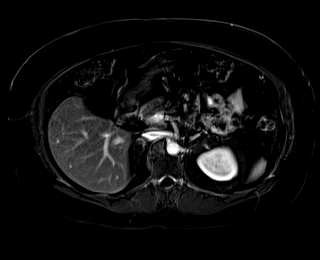
[im 80/80]
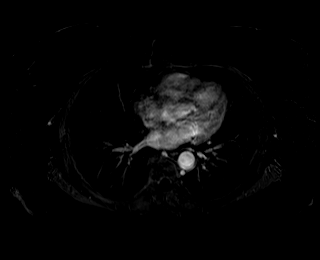

[Series 23: t1_vibe_fs_tra_p4_bh_post · axial · 3.0mm · 1.31mm/px · z∈[-229,+8]mm · 3 of 80 slices shown (4 of 4)]
[im 1/80]
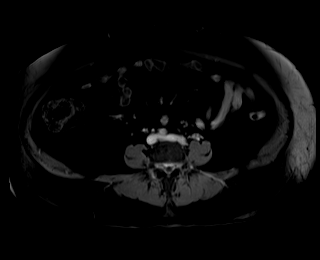
[im 40/80]
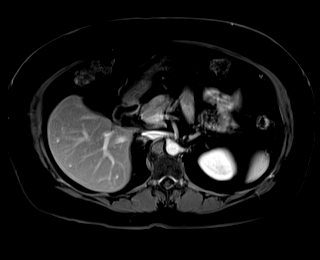
[im 80/80]
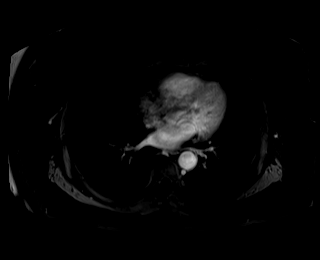

[Series 24: t1_vibe_fs_tra_p4_bh_post_sub · axial · 3.0mm · 1.31mm/px · z∈[-229,+8]mm · 3 of 80 slices shown (4 of 4)]
[im 1/80]
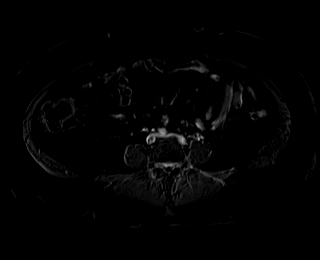
[im 40/80]
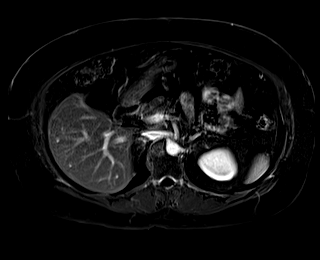
[im 80/80]
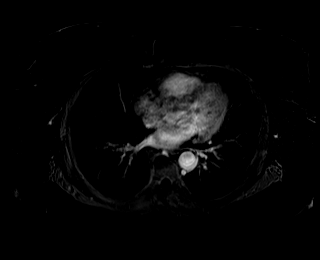

[Series 25: T1 dynamic post-contrast · coronal · 3.0mm · 1.31mm/px · 3 of 88 slices shown]
[im 1/88]
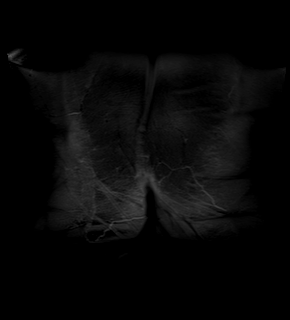
[im 44/88]
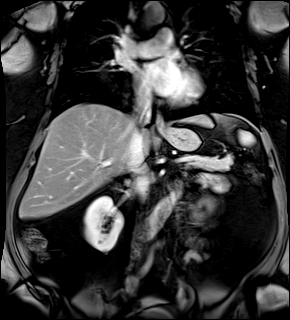
[im 88/88]
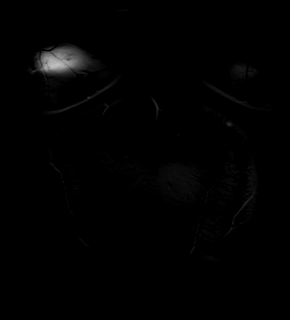

[48 of 48 positions shown; findings below may reference images not displayed]

FINDINGS: Lower chest: No acute findings.

Hepatobiliary: Hepatic steatosis. Unchanged subcapsular hemangioma
of the inferior left lobe of the liver (series 17, image 32). No
solid mass or other parenchymal abnormality identified. Status post
cholecystectomy. Postoperative biliary ductal dilatation.

Pancreas: No mass, inflammatory changes, or other parenchymal
abnormality identified. Incidental note of pancreas divisum. No
pancreatic ductal dilatation.

Spleen:  Within normal limits in size and appearance.

Adrenals/Urinary Tract: No masses identified. There is a T1
hyperintense, T2 hyperintense exophytic hemorrhagic or proteinaceous
cyst of the inferior pole of the right kidney measuring 0.8 cm
(series 15, image 75). No associated solid component or contrast
enhancement. (No evidence of hydronephrosis.

Stomach/Bowel: Visualized portions within the abdomen are
unremarkable.

Vascular/Lymphatic: No pathologically enlarged lymph nodes
identified. No abdominal aortic aneurysm demonstrated.

Other:  None.

Musculoskeletal: No suspicious bone lesions identified.
IMPRESSION: 1. 0.8 cm exophytic hemorrhagic or proteinaceous cyst of the
inferior pole of the right kidney. No associated solid component or
contrast enhancement. No further follow-up or characterization is
required for this definitively benign cyst.
2. Hepatic steatosis.
3. Status post cholecystectomy.

## 2022-04-11 ENCOUNTER — Ambulatory Visit: Payer: No Typology Code available for payment source | Admitting: Primary Care

## 2022-04-11 LAB — CYTOLOGY - PAP
Comment: NEGATIVE
Diagnosis: NEGATIVE
High risk HPV: NEGATIVE

## 2022-04-21 ENCOUNTER — Other Ambulatory Visit: Payer: Self-pay | Admitting: *Deleted

## 2022-04-21 DIAGNOSIS — Z1231 Encounter for screening mammogram for malignant neoplasm of breast: Secondary | ICD-10-CM

## 2022-05-11 NOTE — Addendum Note (Signed)
Addended by: Demetrius Revel on: 05/11/2022 04:06 PM   Modules accepted: Orders

## 2022-05-11 NOTE — Addendum Note (Signed)
Addended by: Demetrius Revel on: 05/11/2022 03:41 PM   Modules accepted: Orders

## 2022-05-26 ENCOUNTER — Ambulatory Visit: Payer: Self-pay | Admitting: *Deleted

## 2022-05-26 ENCOUNTER — Ambulatory Visit
Admission: RE | Admit: 2022-05-26 | Discharge: 2022-05-26 | Disposition: A | Discharge status: Home / Self Care | Payer: No Typology Code available for payment source | Source: Ambulatory Visit | Attending: Obstetrics and Gynecology | Admitting: Obstetrics and Gynecology

## 2022-05-26 VITALS — BP 132/82 | Wt 178.1 lb

## 2022-05-26 DIAGNOSIS — Z1239 Encounter for other screening for malignant neoplasm of breast: Secondary | ICD-10-CM

## 2022-05-26 DIAGNOSIS — Z1231 Encounter for screening mammogram for malignant neoplasm of breast: Secondary | ICD-10-CM

## 2022-05-26 NOTE — Progress Notes (Signed)
Susan Lopez is a 61 y.o. female who presents to [facility] clinic today with complaint of left outer breast pain since 2014 that comes and goes. Patient rates the pain at a 4-5 out of 10. Patient stated the pain was the same as noted on exam 09/28/2017 and 02/04/2021. Follow-up Diagnostic mammogram was completed 09/28/2017 and negative. .    Pap Smear: Pap smear not completed today. Last Pap smear was 04/07/2022 at [facility] and was normal with negative HPV. Patient has history of one abnormal Pap smear 11/26/2020 that was ASCUS with negative HPV that her most recent Pap smear was her follow up. Last two Pap smear results are available in Epic.   Physical exam: Breasts Breasts symmetrical. No skin abnormalities bilateral breasts. No nipple retraction bilateral breasts. No nipple discharge bilateral breasts. No lymphadenopathy. No lumps palpated bilateral breasts. Complaints of left outer breast pain on exam.      MS DIGITAL SCREENING TOMO BILATERAL  Result Date: 02/06/2021 CLINICAL DATA:  Screening. EXAM: DIGITAL SCREENING BILATERAL MAMMOGRAM WITH TOMOSYNTHESIS AND CAD TECHNIQUE: Bilateral screening digital craniocaudal and mediolateral oblique mammograms were obtained. Bilateral screening digital breast tomosynthesis was performed. The images were evaluated with computer-aided detection. COMPARISON:  Previous exam(s). ACR Breast Density Category b: There are scattered areas of fibroglandular density. FINDINGS: There are no findings suspicious for malignancy. The images were evaluated with computer-aided detection. IMPRESSION: No mammographic evidence of malignancy. A result letter of this screening mammogram will be mailed directly to the patient. RECOMMENDATION: Screening mammogram in one year. (Code:SM-B-01Y) BI-RADS CATEGORY  1: Negative. Electronically Signed   By: Ammie Ferrier M.D.   On: 02/06/2021 08:27   MS DIGITAL SCREENING TOMO BILATERAL  Result Date:  01/01/2020 CLINICAL DATA:  Screening. EXAM: DIGITAL SCREENING BILATERAL MAMMOGRAM WITH TOMO AND CAD COMPARISON:  Previous exam(s). ACR Breast Density Category b: There are scattered areas of fibroglandular density. FINDINGS: There are no findings suspicious for malignancy. Images were processed with CAD. IMPRESSION: No mammographic evidence of malignancy. A result letter of this screening mammogram will be mailed directly to the patient. RECOMMENDATION: Screening mammogram in one year. (Code:SM-B-01Y) BI-RADS CATEGORY  1: Negative. Electronically Signed   By: Kristopher Oppenheim M.D.   On: 01/01/2020 12:58   MM DIAG BREAST TOMO BILATERAL  Result Date: 09/28/2017 CLINICAL DATA:  Two areas of concern in the left breast. Possible palpable abnormality and pain at each site. EXAM: 2D DIGITAL DIAGNOSTIC BILATERAL MAMMOGRAM WITH CAD AND ADJUNCT TOMO ULTRASOUND LEFT BREAST COMPARISON:  12/25/2015 and earlier ACR Breast Density Category b: There are scattered areas of fibroglandular density. FINDINGS: No suspicious mass, distortion, or microcalcifications are identified to suggest presence of malignancy. Spot tangential views are performed over the areas of concern to the patient. These views show normal appearing fibrofatty tissue without suspicious mass or distortion. Mammographic images were processed with CAD. Targeted ultrasound is performed, showing normal appearing fibroglandular tissue in the far lateral portion of the left breast. Normal appearing fibroglandular tissue is also imaged in the 1 o'clock location of the left breast. No mass, distortion, or acoustic shadowing is demonstrated with ultrasound. IMPRESSION: No mammographic or ultrasound evidence for malignancy. RECOMMENDATION: Screening mammogram in one year.(Code:SM-B-01Y) I have discussed the findings and recommendations with the patient with the assistance of an interpreter. Results were also provided in writing at the conclusion of the visit. If  applicable, a reminder letter will be sent to the patient regarding the next appointment. BI-RADS CATEGORY  1: Negative. Electronically Signed   By: Nolon Nations M.D.   On: 09/28/2017 15:46     Pelvic/Bimanual Pap is not indicated today per BCCCP guidelines.   Smoking History: Patient is a former smoker that quit in 2008 per patient.   Patient Navigation: Patient education provided. Access to services provided for patient through [facility]. Spanish interpreter Susan Lopez from [facility] provided. Transportation provided home from [employer] appointment.  Colorectal Cancer Screening: Per patient has had colonoscopy completed on 05/09/2018. No complaints today.    Breast and Cervical Cancer Risk Assessment: Patient does not have family history of breast cancer, known genetic mutations, or radiation treatment to the chest before age 40. Patient does not have history of cervical dysplasia, immunocompromised, or DES exposure in-utero.  Risk Assessment     Risk Scores       05/26/2022 02/04/2021   Last edited by: Demetrius Revel, LPN Royston Bake, CMA   5-year risk: 0.8 % 0.8 %   Lifetime risk: 4.2 % 4.3 %            A: BCCCP exam without pap smear Complaint of left outer breast pain.  P: Referred patient to the [facility] for a screening mammogram on the mobile unit. Appointment scheduled Thursday, May 26, 2022 at 1050.  Loletta Parish, RN 05/26/2022 10:42 AM

## 2022-05-26 NOTE — Patient Instructions (Signed)
Explained breast self awareness with Susan Lopez. Patient did not need a Pap smear today due to last Pap smear and HPV typing was 04/07/2022. Let her know BCCCP will cover Pap smears and HPV typing every 5 years unless has a history of abnormal Pap smears. Referred patient to the [facility] for a screening mammogram on the mobile unit. Appointment scheduled Thursday, May 26, 2022 at 1050. Patient aware of appointment and will be there. Let [facility] know [facility] will follow up with her within the next couple weeks with results of her mammogram by letter or phone. Susan Lopez verbalized understanding.  10:42 AM

## 2022-06-20 ENCOUNTER — Other Ambulatory Visit: Payer: Self-pay

## 2022-07-29 ENCOUNTER — Telehealth: Payer: Self-pay | Admitting: Family Medicine

## 2022-07-29 NOTE — Telephone Encounter (Signed)
Pt is calling to schedule appt with financial counselor to renew her financial assistance. [phone]

## 2022-07-29 NOTE — Telephone Encounter (Signed)
Pt was called and informed to come to office to pick up application and mail all documents.

## 2022-11-17 ENCOUNTER — Emergency Department (HOSPITAL_COMMUNITY)
Admission: EM | Admit: 2022-11-17 | Discharge: 2022-11-18 | Disposition: A | Discharge status: Home / Self Care | Payer: No Typology Code available for payment source | Attending: Emergency Medicine | Admitting: Emergency Medicine

## 2022-11-17 ENCOUNTER — Encounter (HOSPITAL_COMMUNITY): Payer: Self-pay | Admitting: Emergency Medicine

## 2022-11-17 ENCOUNTER — Other Ambulatory Visit: Payer: Self-pay

## 2022-11-17 ENCOUNTER — Emergency Department (HOSPITAL_COMMUNITY): Payer: No Typology Code available for payment source

## 2022-11-17 DIAGNOSIS — R079 Chest pain, unspecified: Secondary | ICD-10-CM | POA: Insufficient documentation

## 2022-11-17 DIAGNOSIS — Z1152 Encounter for screening for COVID-19: Secondary | ICD-10-CM | POA: Insufficient documentation

## 2022-11-17 DIAGNOSIS — I10 Essential (primary) hypertension: Secondary | ICD-10-CM

## 2022-11-17 DIAGNOSIS — M791 Myalgia, unspecified site: Secondary | ICD-10-CM

## 2022-11-17 LAB — RESP PANEL BY RT-PCR (RSV, FLU A&B, COVID)  RVPGX2
Influenza A by PCR: NEGATIVE
Influenza B by PCR: NEGATIVE
Resp Syncytial Virus by PCR: NEGATIVE
SARS Coronavirus 2 by RT PCR: NEGATIVE

## 2022-11-17 LAB — CBC
HCT: 42.8 % (ref 36.0–46.0)
Hemoglobin: 14.4 g/dL (ref 12.0–15.0)
MCH: 31.4 pg (ref 26.0–34.0)
MCHC: 33.6 g/dL (ref 30.0–36.0)
MCV: 93.2 fL (ref 80.0–100.0)
Platelets: 230 10*3/uL (ref 150–400)
RBC: 4.59 MIL/uL (ref 3.87–5.11)
RDW: 12.5 % (ref 11.5–15.5)
WBC: 6.5 10*3/uL (ref 4.0–10.5)
nRBC: 0 % (ref 0.0–0.2)

## 2022-11-17 LAB — BASIC METABOLIC PANEL
Anion gap: 9 (ref 5–15)
BUN: 9 mg/dL (ref 8–23)
CO2: 25 mmol/L (ref 22–32)
Calcium: 8.9 mg/dL (ref 8.9–10.3)
Chloride: 104 mmol/L (ref 98–111)
Creatinine, Ser: 0.57 mg/dL (ref 0.44–1.00)
GFR, Estimated: >60 mL/min (ref >60)
Glucose, Bld: 200 mg/dL — ABNORMAL HIGH (ref 70–99)
Potassium: 3.7 mmol/L (ref 3.5–5.1)
Sodium: 138 mmol/L (ref 135–145)

## 2022-11-17 LAB — TROPONIN I (HIGH SENSITIVITY)
Troponin I (High Sensitivity): <2 ng/L (ref <18)
Troponin I (High Sensitivity): <2 ng/L (ref <18)

## 2022-11-17 NOTE — Discharge Instructions (Signed)
Follow up with your Physician for recheck  

## 2022-11-17 NOTE — ED Triage Notes (Signed)
Pt woke at 5am with left sided chest pain non radiating. Denies n/v/d. Slight non productive cough. Denies SOB.

## 2022-11-17 NOTE — ED Provider Triage Note (Signed)
Emergency Medicine Provider Triage Evaluation Note  Susan Lopez , a 62 y.o. female  was evaluated in triage.  Pt complains of chest pain that has been intermittent and sharp since 0500 today. Denies any shortness or breath or diaphoresis. Reports feeling tired.  Review of Systems  Positive:  Negative:   Physical Exam  BP (!) 147/91 (BP Location: Right Arm)   Pulse 84   Temp 98.1 F (36.7 C) (Oral)   Resp 16   Ht '4\' 11"'$  (1.499 m)   Wt 80 kg   LMP 10/08/2015 (Exact Date)   SpO2 98%   BMI 35.62 kg/m  Gen:   Awake, no distress   Resp:  Normal effort  MSK:   Moves extremities without difficulty  Other:  RRR.  Equal radial pulses bilaterally.  Patient in no acute distress.  Medical Decision Making  Medically screening exam initiated at 2:52 PM.  Appropriate orders placed.  Ingri Diemer was informed that the remainder of the evaluation will be completed by another provider, this initial triage assessment does not replace that evaluation, and the importance of remaining in the ED until their evaluation is complete.  Cardiac labs ordered.  EKG.  Chest x-ray.   Sherrell Puller, PA-C 11/17/22 1454

## 2022-11-18 MED ORDER — LISINOPRIL 10 MG PO TABS: 10.0000 mg | ORAL_TABLET | Freq: Every day | ORAL | 1 refills | Status: DC

## 2022-11-18 NOTE — ED Provider Notes (Signed)
[facility] Provider Note   CSN: [id number] Arrival date & time: 11/17/22  1403     History  Chief Complaint  Patient presents with   Chest Pain    Susan Lopez is a 62 y.o. female.  Patient complains of a cough and congestion.  Patient reports she had some discomfort in her chest today that concerned her.  Patient reports she has had similar symptoms in the past.  It sounds as if symptoms are usually associated with GERD.  Patient reports that she noticed the pain around 5 AM.  Patient reports she has had a mild cough and congestion  The history is provided by a relative. A language interpreter was used.  Chest Pain Pain location:  Unable to specify Pain quality: aching   Progression:  Partially resolved Context: not breathing   Relieved by:  Nothing Worsened by:  Nothing      Home Medications Prior to Admission medications   Medication Sig Start Date End Date Taking? Authorizing Provider  albuterol (VENTOLIN HFA) 108 (90 Base) MCG/ACT inhaler Inhale 2 puffs into the lungs every 6 (six) hours as needed for wheezing or shortness of breath. 01/13/22   Elsie Stain, MD  atorvastatin (LIPITOR) 20 MG tablet TAKE 1 TABLET (20 MG TOTAL) BY MOUTH DAILY. 01/11/22 01/11/23  Charlott Rakes, MD  fluticasone (FLONASE) 50 MCG/ACT nasal spray Place 2 sprays into both nostrils daily. 01/13/22   Elsie Stain, MD  gabapentin (NEURONTIN) 100 MG capsule TAKE 1 CAPSULE BY MOUTH 2 TIMES DAILY. 02/21/22 02/21/23  Charlott Rakes, MD  hydrocortisone-pramoxine (ANALPRAM HC) 2.5-1 % rectal cream Place 1 application rectally 3 (three) times daily. 02/16/21   Charlott Rakes, MD  linaclotide (LINZESS) 72 MCG capsule Take 1 capsule (72 mcg total) by mouth daily before breakfast. 01/11/22   Charlott Rakes, MD  lisinopril (ZESTRIL) 10 MG tablet Take 1 tablet (10 mg total) by mouth daily. 11/18/22   Fransico Meadow, PA-C  Multiple Vitamin  (MULTIVITAMIN WITH MINERALS) TABS tablet Take 1 tablet by mouth daily.    [provider]  omeprazole (PRILOSEC) 40 MG capsule TOME 1 PASTILLA POR VIA ORAL DOS VECES AL DIA A REDUCIR EL ACIDO DEL ESTOMAGO 01/11/22 01/11/23  Charlott Rakes, MD  oxyCODONE-acetaminophen (PERCOCET/ROXICET) 5-325 MG tablet Take 1 tablet by mouth every 4 (four) hours as needed. Patient not taking: Reported on 02/21/2022 02/02/21   Constant, Peggy, MD  polyethylene glycol powder (GLYCOLAX/MIRALAX) 17 GM/SCOOP powder Take 17 g by mouth daily. As needed for constipation relief 02/16/21   Charlott Rakes, MD  Probiotic Product (PROBIOTIC PO) Take by mouth daily.    [provider]  psyllium (METAMUCIL) 58.6 % powder Take 1 packet by mouth as needed. 2 x weekly    [provider]  UNABLE TO FIND Calcium, magnesium & zinc (all in 1), once daily    [provider]      Allergies    Hydrocodone, Tramadol, and Oxycodone    Review of Systems   Review of Systems  Cardiovascular:  Positive for chest pain.  All other systems reviewed and are negative.   Physical Exam Updated Vital Signs BP 124/89   Pulse 61   Temp 98.4 F (36.9 C) (Oral)   Resp 19   Ht '4\' 11"'$  (1.499 m)   Wt 80 kg   LMP 10/08/2015 (Exact Date)   SpO2 100%   BMI 35.62 kg/m  Physical Exam Vitals and nursing note  reviewed.  Constitutional:      General: She is not in acute distress.    Appearance: She is well-developed.  HENT:     Head: Normocephalic and atraumatic.  Eyes:     Conjunctiva/sclera: Conjunctivae normal.  Cardiovascular:     Rate and Rhythm: Normal rate and regular rhythm.     Heart sounds: Normal heart sounds. No murmur heard. Pulmonary:     Effort: Pulmonary effort is normal. No respiratory distress.     Breath sounds: Normal breath sounds.  Abdominal:     Palpations: Abdomen is soft.     Tenderness: There is no abdominal tenderness.  Musculoskeletal:        General: No swelling.     Cervical  back: Neck supple.  Skin:    General: Skin is warm and dry.     Capillary Refill: Capillary refill takes less than 2 seconds.  Neurological:     General: No focal deficit present.     Mental Status: She is alert.  Psychiatric:        Mood and Affect: Mood normal.     ED Results / Procedures / Treatments   Labs (all labs ordered are listed, but only abnormal results are displayed) Labs Reviewed  BASIC METABOLIC PANEL - Abnormal; Notable for the following components:      Result Value   Glucose, Bld 200 (*)    All other components within normal limits  RESP PANEL BY RT-PCR (RSV, FLU A&B, COVID)  [id number]  CBC  TROPONIN I (HIGH SENSITIVITY)  TROPONIN I (HIGH SENSITIVITY)    EKG EKG Interpretation  Date/Time:  Thursday November 17 2022 14:10:07 EST Ventricular Rate:  80 PR Interval:  166 QRS Duration: 88 QT Interval:  352 QTC Calculation: 405 R Axis:   7 Text Interpretation: Normal sinus rhythm Cannot rule out Anterior infarct , age undetermined Abnormal ECG When compared with ECG of 09-Mar-2020 07:35, No significant change since last tracing Confirmed by Aletta Edouard [id number]) on 11/17/2022 7:43:44 PM  Radiology DG Chest 2 View  Result Date: 11/17/2022 CLINICAL DATA:  Chest pain EXAM: CHEST - 2 VIEW COMPARISON:  Chest x-ray dated December 16, 2020 FINDINGS: The heart size and mediastinal contours are within normal limits. Both lungs are clear. The visualized skeletal structures are unremarkable. IMPRESSION: No active cardiopulmonary disease. Electronically Signed   By: Yetta Glassman M.D.   On: 11/17/2022 15:08    Procedures Procedures    Medications Ordered in ED Medications - No data to display  ED Course/ Medical Decision Making/ A&P                             Medical Decision Making Patient complains of discomfort in her chest since 5 AM patient has also had a cough  Amount and/or Complexity of Data Reviewed Independent Historian: caregiver    Details: And  is here with her daughter who is supportive External Data Reviewed: notes.    Details: Primary notes reviewed Labs: ordered. Decision-making details documented in ED Course.    Details: Labs ordered reviewed and interpreted patient has troponin that is negative x 2 patient has a negative COVID flu and RSV test Radiology: ordered.    Details: Chest x-ray shows no acute abnormality ECG/medicine tests: ordered and independent interpretation performed.    Details: EKG is nonacute  Risk Prescription drug management. Risk Details: I discussed with patient has her symptoms I think symptoms are most likely  related to upper respiratory infection probable viral illness chest pain is very nonspecific and has completely resolved this is reassuring in combination of negative troponin test.  Patient is advised to schedule to follow-up with her cardiologist.  Patient states she is out of her lisinopril and request refill this is sent to patient's pharmacy.           Final Clinical Impression(s) / ED Diagnoses Final diagnoses:  Myalgia  Nonspecific chest pain    Rx / DC Orders ED Discharge Orders          Ordered    lisinopril (ZESTRIL) 10 MG tablet  Daily       Note to Pharmacy: Discontinue Valsartan   11/18/22 0002           An After Visit Summary was printed and given to the patient.    Fransico Meadow, [place] 11/18/22 1104    Hayden Rasmussen, MD 11/18/22 1323

## 2022-11-18 NOTE — ED Notes (Signed)
RN reviewed discharge instructions with pt. Pt verbalized understanding and had no further questions. VSS upon discharge. ?

## 2023-01-25 ENCOUNTER — Ambulatory Visit: Payer: Self-pay

## 2023-01-25 NOTE — Telephone Encounter (Signed)
  Chief Complaint: abdominal pain Symptoms: abdominal pain comes and goes 6-8/10, rectal pain d/t hemorrhoids and vaginal pain Frequency: 1 week but has had this problem before  Pertinent Negatives: NA Disposition: [] ED /[] Urgent Care (no appt availability in office) / [x] Appointment(In office/virtual)/ []  [facility]/ [] Home Care/ [] Refused Recommended Disposition /[]  Mobile Bus/ []  Follow-up with PCP Additional Notes: pt states she has had this issue for years and comes and goes but has been worse this time for 1 week. Has BM 2-3x/day depending on what she eats and feels like she has a lot of inflammation in her bowels. Offered appt for today but d/t pt not driving and needing to find a ride scheduled OV for tomorrow at 1110 with Levada Dy, [place] so pt has time to find a ride. Care advice given and pt verbalized understanding.   Reason for Disposition  [1] MODERATE pain (e.g., interferes with normal activities) AND [2] pain comes and goes (cramps) AND [3] present > 24 hours  (Exception: Pain with Vomiting or Diarrhea - see that Guideline.)  Answer Assessment - Initial Assessment Questions 1. LOCATION: "Where does it hurt?"      Abdominal pain 2. RADIATION: "Does the pain shoot anywhere else?" (e.g., chest, back)     Rectal and vaginal pain 3. ONSET: "When did the pain begin?" (e.g., minutes, hours or days ago)      1 week  5. PATTERN "Does the pain come and go, or is it constant?"    - If it comes and goes: "How long does it last?" "Do you have pain now?"     (Note: Comes and goes means the pain is intermittent. It goes away completely between bouts.)    - If constant: "Is it getting better, staying the same, or getting worse?"      (Note: Constant means the pain never goes away completely; most serious pain is constant and gets worse.)      Comes goes  6. SEVERITY: "How bad is the pain?"  (e.g., Scale 1-10; mild, moderate, or severe)    - MILD (1-3): Doesn't interfere  with normal activities, abdomen soft and not tender to touch.     - MODERATE (4-7): Interferes with normal activities or awakens from sleep, abdomen tender to touch.     - SEVERE (8-10): Excruciating pain, doubled over, unable to do any normal activities.       6-8, stabbing pain at times  7. RECURRENT SYMPTOM: "Have you ever had this type of stomach pain before?" If Yes, ask: "When was the last time?" and "What happened that time?"      Had problem for a long time but comes and goes  8. CAUSE: "What do you think is causing the stomach pain?"     Feels like colon issues 9. RELIEVING/AGGRAVATING FACTORS: "What makes it better or worse?" (e.g., antacids, bending or twisting motion, bowel movement)     Green juices helps but not helping right now  10. OTHER SYMPTOMS: "Do you have any other symptoms?" (e.g., back pain, diarrhea, fever, urination pain, vomiting)       Hemorrhoids, rectal pain  Protocols used: Abdominal Pain - Female-A-AH

## 2023-01-26 ENCOUNTER — Encounter: Payer: Self-pay | Admitting: Physician Assistant

## 2023-01-26 ENCOUNTER — Ambulatory Visit: Payer: Self-pay | Attending: Physician Assistant | Admitting: Physician Assistant

## 2023-01-26 ENCOUNTER — Other Ambulatory Visit: Payer: Self-pay

## 2023-01-26 VITALS — BP 122/79 | HR 105 | Wt 174.6 lb

## 2023-01-26 DIAGNOSIS — I10 Essential (primary) hypertension: Secondary | ICD-10-CM

## 2023-01-26 DIAGNOSIS — E785 Hyperlipidemia, unspecified: Secondary | ICD-10-CM

## 2023-01-26 DIAGNOSIS — R140 Abdominal distension (gaseous): Secondary | ICD-10-CM

## 2023-01-26 DIAGNOSIS — R739 Hyperglycemia, unspecified: Secondary | ICD-10-CM

## 2023-01-26 DIAGNOSIS — K219 Gastro-esophageal reflux disease without esophagitis: Secondary | ICD-10-CM

## 2023-01-26 DIAGNOSIS — R1084 Generalized abdominal pain: Secondary | ICD-10-CM

## 2023-01-26 LAB — POCT URINALYSIS DIP (CLINITEK)
Bilirubin, UA: NEGATIVE
Glucose, UA: NEGATIVE mg/dL
Ketones, POC UA: NEGATIVE mg/dL
Leukocytes, UA: NEGATIVE
Nitrite, UA: NEGATIVE
POC PROTEIN,UA: 100 — AB
Spec Grav, UA: >=1.03 — AB (ref 1.010–1.025)
Urobilinogen, UA: 0.2 E.U./dL
pH, UA: 6 (ref 5.0–8.0)

## 2023-01-26 MED ORDER — SIMETHICONE 80 MG PO CHEW
160.0000 mg | CHEWABLE_TABLET | Freq: Four times a day (QID) | ORAL | 0 refills | Status: DC | PRN
  Filled 2023-01-26: qty 60, 8d supply, fill #0

## 2023-01-26 MED ORDER — ATORVASTATIN CALCIUM 20 MG PO TABS
20.0000 mg | ORAL_TABLET | Freq: Every day | ORAL | 1 refills | Status: DC
  Filled 2023-01-26: qty 90, 90d supply, fill #0

## 2023-01-26 MED ORDER — LISINOPRIL 10 MG PO TABS
10.0000 mg | ORAL_TABLET | Freq: Every day | ORAL | 1 refills | Status: DC
  Filled 2023-01-26: qty 90, 90d supply, fill #0

## 2023-01-26 MED ORDER — OMEPRAZOLE 40 MG PO CPDR
40.0000 mg | DELAYED_RELEASE_CAPSULE | Freq: Every day | ORAL | 1 refills | Status: DC
  Filled 2023-01-26: qty 90, 90d supply, fill #0

## 2023-01-26 NOTE — Patient Instructions (Addendum)
Drink 80-100 ounces water daily  Eliminate sugars and starches from your diet.    Add exercise and activity to help reduce weight.    Beba entre 80 y 100 onzas de agua al da  [employer] azcares y almidones de tu dieta.  Agregue ejercicio y [place] para ayudar a reducir [employer].

## 2023-01-26 NOTE — Addendum Note (Signed)
Addended by: Gasper Lloyd on: 01/26/2023 12:44 PM   Modules accepted: Orders

## 2023-01-26 NOTE — Progress Notes (Signed)
Patient ID: Susan Lopez, female   DOB: 04-Jun-1961, 62 y.o.   MRN: [id number]   Susan Lopez, is a 62 y.o. female  [id number]  [id number]  DOB - [birth year]  Chief Complaint  Patient presents with   Abdominal Pain   Nausea   Dizziness       Subjective:   Susan Lopez is a 62 y.o. female here today for RUQ pain and LLQ pain.  This has been going on for a long time but she has had more bloating over the last week or 2.  Occasional nausea.  No vomiting.  No melena/hematochezia.  She does have occasional hemorrhoids.  No change in appetite.  Seen by GI about 4 years for similar.  +some constipation.    Pain with bending over for about 6 months.  Pain is also in lower abdominal area.  Pap June 2023 was  [facility] translating.      No problems updated.  ALLERGIES: Allergies  Allergen Reactions   Hydrocodone Nausea And Vomiting   Tramadol Nausea Only   Oxycodone Rash    PAST MEDICAL HISTORY: Past Medical History:  Diagnosis Date   Anxiety    Arthritis    Chronic gastritis    Chronic thoracic spine pain    Depression    GERD (gastroesophageal reflux disease)    Hemorrhoids, external 10-21-11   occ. bothersome   History of colon polyps    Hyperlipidemia    Hypertension    Iron deficiency anemia    OSA on CPAP    study in epic 08-29-2017 severe osa (AHI 32.5), per pt daughter uses nightly   PMB (postmenopausal bleeding)    PONV (postoperative nausea and vomiting)    Pre-diabetes     MEDICATIONS AT HOME: Prior to Admission medications   Medication Sig Start Date End Date Taking? Authorizing Provider  albuterol (VENTOLIN HFA) 108 (90 Base) MCG/ACT inhaler Inhale 2 puffs into the lungs every 6 (six) hours as needed for wheezing or shortness of breath. 01/13/22  Yes Elsie Stain, MD  fluticasone (FLONASE) 50 MCG/ACT nasal spray Place 2 sprays into both nostrils daily. 01/13/22  Yes Elsie Stain, MD  gabapentin (NEURONTIN) 100 MG  capsule TAKE 1 CAPSULE BY MOUTH 2 TIMES DAILY. 02/21/22 02/21/23 Yes Newlin, Charlane Ferretti, MD  hydrocortisone-pramoxine (ANALPRAM HC) 2.5-1 % rectal cream Place 1 application rectally 3 (three) times daily. 02/16/21  Yes Charlott Rakes, MD  linaclotide (LINZESS) 72 MCG capsule Take 1 capsule (72 mcg total) by mouth daily before breakfast. 01/11/22  Yes Newlin, Enobong, MD  lisinopril (ZESTRIL) 10 MG tablet Take 1 tablet (10 mg total) by mouth daily. 11/18/22  Yes Fransico Meadow, PA-C  Multiple Vitamin (MULTIVITAMIN WITH MINERALS) TABS tablet Take 1 tablet by mouth daily.   Yes [provider]  oxyCODONE-acetaminophen (PERCOCET/ROXICET) 5-325 MG tablet Take 1 tablet by mouth every 4 (four) hours as needed. 02/02/21  Yes Constant, Peggy, MD  polyethylene glycol powder (GLYCOLAX/MIRALAX) 17 GM/SCOOP powder Take 17 g by mouth daily. As needed for constipation relief 02/16/21  Yes Charlott Rakes, MD  Probiotic Product (PROBIOTIC PO) Take by mouth daily.   Yes [provider]  psyllium (METAMUCIL) 58.6 % powder Take 1 packet by mouth as needed. 2 x weekly   Yes [provider]  simethicone (GAS-X) 80 MG chewable tablet Chew 2 tablets (160 mg total) by mouth every 6 (six) hours as needed for flatulence. Gas/bloating 01/26/23  Yes Aldo Sondgeroth, Dionne Bucy, PA-C  UNABLE  TO FIND Calcium, magnesium & zinc (all in 1), once daily   Yes [provider]  atorvastatin (LIPITOR) 20 MG tablet Take 1 tablet (20 mg total) by mouth daily. 01/26/23   Argentina Donovan, PA-C  omeprazole (PRILOSEC) 40 MG capsule TOME 1 PASTILLA POR VIA ORAL DOS VECES AL DIA A REDUCIR EL ACIDO DEL ESTOMAGO 01/11/22 01/11/23  Newlin, Charlane Ferretti, MD    ROS: Neg HEENT Neg resp Neg cardiac Neg GI Neg GU Neg MS Neg psych Neg neuro  Objective:   Vitals:   01/26/23 1109  BP: 122/79  Pulse: (!) 105  SpO2: 97%  Weight: 174 lb 9.6 oz (79.2 kg)   Exam General appearance : Awake, alert, not in any distress. Speech Clear.  Not toxic looking HEENT: Atraumatic and Normocephalic, pupils equally reactive to light and accomodation Neck: Supple, no JVD. No cervical lymphadenopathy.  Chest: Good air entry bilaterally, CTAB.  No rales/rhonchi/wheezing CVS: S1 S2 regular, no murmurs.  Abdomen: Bowel sounds present, very obese. Generalized tenderness all over but no localized tenderness with no gaurding, rigidity or rebound. Extremities: B/L Lower Ext shows no edema, both legs are warm to touch Neurology: Awake alert, and oriented X 3, CN II-XII intact, Non focal Skin: No Rash  Data Review Lab Results  Component Value Date   HGBA1C 6.0 (H) 01/13/2022   HGBA1C 6.1 (H) 05/24/2021   HGBA1C 5.8 (A) 10/05/2020    Assessment & Plan   1. Generalized abdominal pain No red flags/non-acute abdomen - POCT URINALYSIS DIP (CLINITEK) - Ambulatory referral to Gastroenterology - simethicone (GAS-X) 80 MG chewable tablet; Chew 2 tablets (160 mg total) by mouth every 6 (six) hours as needed for flatulence. Gas/bloating  Dispense: 60 tablet; Refill: 0 - Comprehensive metabolic panel - CBC with Differential/Platelet  2. Dyslipidemia - atorvastatin (LIPITOR) 20 MG tablet; Take 1 tablet (20 mg total) by mouth daily.  Dispense: 90 tablet; Refill: 1  3. Bloating - simethicone (GAS-X) 80 MG chewable tablet; Chew 2 tablets (160 mg total) by mouth every 6 (six) hours as needed for flatulence. Gas/bloating  Dispense: 60 tablet; Refill: 0  4. Hyperglycemia-noticed on previous labs I have had a lengthy discussion and provided education about insulin resistance and the intake of too much sugar/refined carbohydrates.  I have advised the patient to work at a goal of eliminating sugary drinks, candy, desserts, sweets, refined sugars, processed foods, and white carbohydrates.  The patient expresses understanding.  - Hemoglobin A1c  [employer] interpreters used and additional time performing visit was required.   Return in about 3 months (around  04/28/2023) for PCP for chronic conditions.  The patient was given clear instructions to go to ER or return to medical center if symptoms don't improve, worsen or new problems develop. The patient verbalized understanding. The patient was told to call to get lab results if they haven't heard anything in the next week.      Freeman Caldron, PA-C [facility] [place], [place]   01/26/2023, 11:38 AM

## 2023-01-27 LAB — CBC WITH DIFFERENTIAL/PLATELET
Basophils Absolute: 0.1 10*3/uL (ref 0.0–0.2)
Basos: 1 %
EOS (ABSOLUTE): 0.1 10*3/uL (ref 0.0–0.4)
Eos: 1 %
Hematocrit: 45.5 % (ref 34.0–46.6)
Hemoglobin: 15.2 g/dL (ref 11.1–15.9)
Immature Grans (Abs): 0 10*3/uL (ref 0.0–0.1)
Immature Granulocytes: 0 %
Lymphocytes Absolute: 1.8 10*3/uL (ref 0.7–3.1)
Lymphs: 25 %
MCH: 31.2 pg (ref 26.6–33.0)
MCHC: 33.4 g/dL (ref 31.5–35.7)
MCV: 93 fL (ref 79–97)
Monocytes Absolute: 0.4 10*3/uL (ref 0.1–0.9)
Monocytes: 6 %
Neutrophils Absolute: 4.7 10*3/uL (ref 1.4–7.0)
Neutrophils: 67 %
Platelets: 280 10*3/uL (ref 150–450)
RBC: 4.87 x10E6/uL (ref 3.77–5.28)
RDW: 12.7 % (ref 11.7–15.4)
WBC: 7.2 10*3/uL (ref 3.4–10.8)

## 2023-01-27 LAB — COMPREHENSIVE METABOLIC PANEL
ALT: 53 IU/L — ABNORMAL HIGH (ref 0–32)
AST: 28 IU/L (ref 0–40)
Albumin/Globulin Ratio: 1.5 (ref 1.2–2.2)
Albumin: 4.3 g/dL (ref 3.9–4.9)
Alkaline Phosphatase: 136 IU/L — ABNORMAL HIGH (ref 44–121)
BUN/Creatinine Ratio: 15 (ref 12–28)
BUN: 11 mg/dL (ref 8–27)
Bilirubin Total: 0.3 mg/dL (ref 0.0–1.2)
CO2: 24 mmol/L (ref 20–29)
Calcium: 9.7 mg/dL (ref 8.7–10.3)
Chloride: 104 mmol/L (ref 96–106)
Creatinine, Ser: 0.72 mg/dL (ref 0.57–1.00)
Globulin, Total: 2.8 g/dL (ref 1.5–4.5)
Glucose: 148 mg/dL — ABNORMAL HIGH (ref 70–99)
Potassium: 4.3 mmol/L (ref 3.5–5.2)
Sodium: 144 mmol/L (ref 134–144)
Total Protein: 7.1 g/dL (ref 6.0–8.5)
eGFR: 94 mL/min/{1.73_m2} (ref >59)

## 2023-01-27 LAB — HEMOGLOBIN A1C
Est. average glucose Bld gHb Est-mCnc: 120 mg/dL
Hgb A1c MFr Bld: 5.8 % — ABNORMAL HIGH (ref 4.8–5.6)

## 2023-01-30 ENCOUNTER — Telehealth: Payer: Self-pay

## 2023-03-22 ENCOUNTER — Other Ambulatory Visit: Payer: Self-pay

## 2023-03-22 ENCOUNTER — Encounter: Payer: Self-pay | Admitting: Family Medicine

## 2023-03-22 ENCOUNTER — Ambulatory Visit: Payer: Self-pay | Attending: Family Medicine | Admitting: Family Medicine

## 2023-03-22 ENCOUNTER — Telehealth: Payer: Self-pay | Admitting: Gastroenterology

## 2023-03-22 VITALS — BP 122/78 | HR 97 | Ht 59.0 in | Wt 172.0 lb

## 2023-03-22 DIAGNOSIS — R1013 Epigastric pain: Secondary | ICD-10-CM

## 2023-03-22 DIAGNOSIS — R5383 Other fatigue: Secondary | ICD-10-CM

## 2023-03-22 DIAGNOSIS — K649 Unspecified hemorrhoids: Secondary | ICD-10-CM

## 2023-03-22 MED ORDER — HYDROCORT-PRAMOXINE (PERIANAL) 2.5-1 % EX CREA
1.0000 | TOPICAL_CREAM | Freq: Three times a day (TID) | CUTANEOUS | 2 refills | Status: DC
  Filled 2023-03-22: qty 30, 10d supply, fill #0

## 2023-03-22 NOTE — Telephone Encounter (Signed)
Friend called on patient's behalf to schedule patient. States that patient has been having a fever, fatigue, inflammation in the stomach, and RUQ abdominal pain. Patient is scheduled for the next available appoint on 08/05. Patient would like a call back to discuss what could be done before then to help with her symptoms. Needs interpreter. Please advise, thank you.

## 2023-03-22 NOTE — Patient Instructions (Signed)
Hemorroides Hemorrhoids Las hemorroides son venas hinchadas en el recto o la zona que lo rodea, o en la abertura de las nalgas (ano). Hay dos tipos de hemorroides: Internas. Se forman en las venas del interior del recto. Pueden abultarse hacia afuera, irritarse y doler. Externas. Estas se producen en las venas que estn fuera del ano. Pueden sentirse como una hinchazn o un bulto duro dolorosos cerca del ano. La mayora de las hemorroides no causan problemas graves. A menudo, pueden tratarse en casa con cambios en la dieta y el estilo de vida. Si los tratamientos caseros no ayudan, quizs necesite un procedimiento para reducir o extirpar las hemorroides. Cules son las causas? Las hemorroides son causadas por la presin cerca del ano. Esta presin puede deberse a lo siguiente: Estreimiento o diarrea. Hace esfuerzo para eliminar heces. Embarazo. Obesidad. Estar sentado o andar en bicicleta durante mucho tiempo. Levantar objetos pesados u otras cosas que impliquen esfuerzo. Sexo anal. Cules son los signos o sntomas? Los sntomas de esta afeccin incluyen: Dolor. Picazn o irritacin anal. Sangrado que proviene del recto. Fuga de materia fecal (heces). Hinchazn del ano. Uno o ms bultos alrededor del ano. Cmo se diagnostica? Las hemorroides se diagnostican frecuentemente a travs de un examen visual. Posiblemente le realicen otros tipos de pruebas o estudios, como los siguientes: Examen rectal digital. Esto lo realiza el mdico mediante tacto rectal con un dedo enguantado. Anoscopia. Este es un examen del ano que se hace con un tubo pequeo. Anlisis de sangre si ha perdido mucha sangre. Sigmoidoscopa o colonoscopa. Estos son estudios para observar el interior del colon a travs de un tubo que tiene una cmara en el extremo. Cmo se trata? En la mayora de los casos, las hemorroides se pueden tratar en casa con cambios en la dieta y el estilo de vida. Si estos cambios no  resultan eficaces, tal vez deba someterse a un procedimiento. Estos procedimientos pueden reducir las hemorroides o extirparlas completamente. Algunos de los procedimientos ms frecuentes son los siguientes: Ligadura con banda elstica. Las bandas elsticas se colocan en la base de las hemorroides para interrumpir su irrigacin de sangre. Escleroterapia. Se pone un medicamento dentro de las hemorroides para reducir su tamao. Coagulacin con luz infrarroja. Se utiliza un tipo de energa lumnica para eliminar las hemorroides. Hemorroidectoma. Las hemorroides se extirpan durante la ciruga. Luego, las venas que las irrigan se atan para cerrarlas. Hemorroidopexia con grapas. La base de las hemorroides se engrapa a la pared del recto. Siga estas instrucciones en su casa: Medicamentos Use los medicamentos de venta libre y los recetados solamente como se lo haya indicado el mdico. Use cremas medicadas o medicamentos medicinales que se ponen en el recto (supositorios) como se lo haya indicado el mdico. Comida y bebida  Consumir alimentos ricos en fibra, como frijoles, cereales integrales, y frutas y verduras frescas. Pregntele a su mdico acerca de tomar productos con fibra aadida (suplementos de fibra). Disminuya la cantidad de grasa de la dieta. Esto se puede lograr consumiendo productos lcteos con bajo contenido de grasas, ingiriendo menor cantidad de carnes rojas y evitando los alimentos procesados. Beber suficiente lquido para mantener el pis (orina) de color amarillo plido. Control del dolor y la hinchazn  Tome baos de asiento tibios durante 20 minutos, 3 a 4 veces por da. Esto puede ayudar a reducir el dolor y el malestar. Puede hacer esto en una baera o usar un dispositivo porttil para bao de asiento que se coloca sobre el inodoro. Si se   lo indican, aplique hielo en la zona afectada. El uso de compresas de hielo entre baos de asiento puede ser de ayuda. Ponga el hielo en una  bolsa plstica. Coloque una toalla entre la piel y la bolsa. Aplique el hielo durante 20 minutos, 2 a 3 veces por da. Si la piel se le pone de color rojo brillante, retire el hielo de inmediato para evitar daos en la piel. El riesgo de dao es mayor si no puede sentir dolor, calor o fro. Instrucciones generales Actividad fsica. Consulte al mdico qu cantidad y qu tipo de ejercicio es mejor para usted. En general, debe realizar al menos 30 minutos de ejercicio moderado la mayora de los das de la semana (150 minutos cada semana). Es recomendable que pruebe con caminar, andar en bicicleta o practicar yoga. Vaya al bao cuando sienta ganas de defecar. No espere. Evite hacer esfuerzos para defecar. Mantenga el ano limpio y seco. Use papel higinico hmedo o toallitas humedecidas despus de defecar. No pase mucho tiempo sentado en el inodoro. Esto puede aumentar la afluencia de sangre y el dolor. Dnde buscar ms informacin National Institute of Diabetes and Digestive and Kidney Diseases (Instituto Nacional de la Diabetes y las Enfermedades Digestivas y Renales): niddk.nih.gov Comunquese con un mdico si: Tiene ms dolor e hinchazn que no mejoran con el tratamiento. Tiene dificultad para defecar o no puede hacerlo. Siente dolor o tiene inflamacin fuera de la zona de las hemorroides. Solicite ayuda de inmediato si: Tiene sangrado del recto y no puede detenerlo. Esta informacin no tiene como fin reemplazar el consejo del mdico. Asegrese de hacerle al mdico cualquier pregunta que tenga. Document Revised: 08/04/2022 Document Reviewed: 08/04/2022 Elsevier Patient Education  2023 Elsevier Inc.     

## 2023-03-22 NOTE — Telephone Encounter (Signed)
The pt was last seen in 2021 by Dr Christella Hartigan. She has had several months of RUQ pain, bloating, fever, and fatigue.  She was seen by her PCP and referred to our office for follow up.  Appt made with Mike Gip for next available.  However, the pt states (using interpreter service) that she does not feel very well and has intense abd pain with fever and fatigue.  I have advised that she be seen in the ED, UC or PCP in the meantime.  She agrees and will keep follow up appt with our office. She will call back if recommended to be seen sooner.

## 2023-03-22 NOTE — Progress Notes (Signed)
Subjective:  Patient ID: Susan Lopez, female    DOB: [birth year]  Age: 62 y.o. MRN: [id number]  CC: Hypertension   HPI Matthew Daggett is a 62 y.o. year old female with a history of hypertension, hyperlipidemia, history of D&C/hysteroscopy and MyoSure polypectomy in 01/2021    Interval History:  She Complains of hemorrhoids with no bleeding but it is painful and sometimes burns. She does have medications on her chart for constipation but she denies being constipated but on further questioning admits to being constipated once/week. States she moves her bowels daily. Also Complains of epigastric pain which radiates to her RUQ and RLQ. States she has 'a lump in her epigastric region' which 'bothers her'. She also has reflux symptoms She endorses adherence with Omeprazole. Endorses presence of excessive flatulence.  MRI Abdomen 07/2021: IMPRESSION: 1. 0.8 cm exophytic hemorrhagic or proteinaceous cyst of the inferior pole of the right kidney. No associated solid component or contrast enhancement. No further follow-up or characterization is required for this definitively benign cyst. 2. Hepatic steatosis. 3. Status post cholecystectomy.    She Complains that over the last few days she has not wanted to do anything besides sleep Past Medical History:  Diagnosis Date   Anxiety    Arthritis    Chronic gastritis    Chronic thoracic spine pain    Depression    GERD (gastroesophageal reflux disease)    Hemorrhoids, external 10-21-11   occ. bothersome   History of colon polyps    Hyperlipidemia    Hypertension    Iron deficiency anemia    OSA on CPAP    study in epic 08-29-2017 severe osa (AHI 32.5), per pt daughter uses nightly   PMB (postmenopausal bleeding)    PONV (postoperative nausea and vomiting)    Pre-diabetes     Past Surgical History:  Procedure Laterality Date   BREAST BIOPSY Left    CESAREAN SECTION  1985, 1992   CHOLECYSTECTOMY  10/27/2011    Procedure: LAPAROSCOPIC CHOLECYSTECTOMY WITH INTRAOPERATIVE CHOLANGIOGRAM;  Surgeon: Ardeth Sportsman, MD;  Location: [facility];  Service: General;  Laterality: N/A;  Laparoscopic Chole w/ IOC Single Site   COLONOSCOPY WITH PROPOFOL  last one 05-09-2018   DILATATION & CURETTAGE/HYSTEROSCOPY WITH MYOSURE N/A 03/11/2020   Procedure: DILATATION AND CURETTAGE /HYSTEROSCOPY WITH MYOSURE;  Surgeon: Catalina Antigua, MD;  Location: [facility];  Service: Gynecology;  Laterality: N/A;   DILATATION & CURETTAGE/HYSTEROSCOPY WITH MYOSURE N/A 02/02/2021   Procedure: DILATATION & CURETTAGE/HYSTEROSCOPY WITH MYOSURE POLYPECTOMY;  Surgeon: Catalina Antigua, MD;  Location: [facility];  Service: Gynecology;  Laterality: N/A;   ESOPHAGOGASTRODUODENOSCOPY (EGD) WITH PROPOFOL  last one 12-30-2019  dr Myrtie Neither   EYE SURGERY  10/21/2011   bil. for tissue growth-laser surgery   HARDWARE REMOVAL Left 03/16/2016   Procedure: LEFT ANKLE HARDWARE REMOVAL;  Surgeon: Tarry Kos, MD;  Location: [facility];  Service: Orthopedics;  Laterality: Left;   MM BREAST STEREO BIOPSY LEFT ([facility] HX) Left 2015   ORIF ANKLE FRACTURE Left 11/13/2015   Procedure: OPEN REDUCTION INTERNAL FIXATION (ORIF) LEFT BIMALLEOLAR ANKLE FRACTURE;  Surgeon: Tarry Kos, MD;  Location: [facility];  Service: Orthopedics;  Laterality: Left;    Family History  Problem Relation Age of Onset   Hypertension Mother    Cancer Mother    Hypertension Father    Arthritis Brother    Hypertension Brother    Intellectual disability Paternal Aunt    Colon cancer  Neg Hx     Social History   Socioeconomic History   Marital status: Single    Spouse name: Not on file   Number of children: 2   Years of education: Not on file   Highest education level: 4th grade  Occupational History   Occupation: unemployed  Tobacco Use   Smoking status: Former    Packs/day: 0.25    Years: 10.00    Additional pack years: 0.00    Total  pack years: 2.50    Types: Cigarettes    Quit date: 2008    Years since quitting: 16.4   Smokeless tobacco: Never  Vaping Use   Vaping Use: Never used  Substance and Sexual Activity   Alcohol use: No   Drug use: Never   Sexual activity: Not Currently    Birth control/protection: Post-menopausal  Other Topics Concern   Not on file  Social History Narrative   Not on file   Social Determinants of Health   Financial Resource Strain: Not on file  Food Insecurity: No Food Insecurity (05/26/2022)   Hunger Vital Sign    Worried About Running Out of Food in the Last Year: Never true    Ran Out of Food in the Last Year: Never true  Transportation Needs: Unmet Transportation Needs (05/26/2022)   PRAPARE - Transportation    Lack of Transportation (Medical): Yes    Lack of Transportation (Non-Medical): Yes  Physical Activity: Insufficiently Active (09/28/2017)   Exercise Vital Sign    Days of Exercise per Week: 7 days    Minutes of Exercise per Session: 20 min  Stress: No Stress Concern Present (09/28/2017)   [employer] of Occupational Health - Occupational Stress Questionnaire    Feeling of Stress : Only a little  Social Connections: Somewhat Isolated (09/28/2017)   Social Connection and Isolation Panel [NHANES]    Frequency of Communication with Friends and Family: More than three times a week    Frequency of Social Gatherings with Friends and Family: Once a week    Attends Religious Services: More than 4 times per year    Active Member of [employer] or Organizations: No    Attends Banker Meetings: Never    Marital Status: Separated    Allergies  Allergen Reactions   Hydrocodone Nausea And Vomiting   Tramadol Nausea Only   Oxycodone Rash    Outpatient Medications Prior to Visit  Medication Sig Dispense Refill   albuterol (VENTOLIN HFA) 108 (90 Base) MCG/ACT inhaler Inhale 2 puffs into the lungs every 6 (six) hours as needed for wheezing or shortness of breath.  8.5 g 0   atorvastatin (LIPITOR) 20 MG tablet Take 1 tablet (20 mg total) by mouth daily. 90 tablet 1   fluticasone (FLONASE) 50 MCG/ACT nasal spray Place 2 sprays into both nostrils daily. 16 g 6   linaclotide (LINZESS) 72 MCG capsule Take 1 capsule (72 mcg total) by mouth daily before breakfast. 30 capsule 1   lisinopril (ZESTRIL) 10 MG tablet Take 1 tablet (10 mg total) by mouth daily. 90 tablet 1   Multiple Vitamin (MULTIVITAMIN WITH MINERALS) TABS tablet Take 1 tablet by mouth daily.     omeprazole (PRILOSEC) 40 MG capsule TOME 1 PASTILLA POR VIA ORAL DOS VECES AL DIA A REDUCIR EL ACIDO DEL ESTOMAGO 90 capsule 1   polyethylene glycol powder (GLYCOLAX/MIRALAX) 17 GM/SCOOP powder Take 17 g by mouth daily. As needed for constipation relief 3350 g 2   Probiotic Product (PROBIOTIC  PO) Take by mouth daily.     psyllium (METAMUCIL) 58.6 % powder Take 1 packet by mouth as needed. 2 x weekly     simethicone (GAS-X) 80 MG chewable tablet Chew 2 tablets (160 mg total) by mouth every 6 (six) hours as needed for flatulence. Gas/bloating 60 tablet 0   UNABLE TO FIND Calcium, magnesium & zinc (all in 1), once daily     hydrocortisone-pramoxine (ANALPRAM HC) 2.5-1 % rectal cream Place 1 application rectally 3 (three) times daily. 30 g 2   gabapentin (NEURONTIN) 100 MG capsule TAKE 1 CAPSULE BY MOUTH 2 TIMES DAILY. 180 capsule 1   oxyCODONE-acetaminophen (PERCOCET/ROXICET) 5-325 MG tablet Take 1 tablet by mouth every 4 (four) hours as needed. (Patient not taking: Reported on 03/22/2023) 15 tablet 0   No facility-administered medications prior to visit.     ROS Review of Systems  Constitutional:  Negative for activity change and appetite change.  HENT:  Negative for sinus pressure and sore throat.   Respiratory:  Negative for chest tightness, shortness of breath and wheezing.   Cardiovascular:  Negative for chest pain and palpitations.  Gastrointestinal:  Negative for abdominal distention, abdominal pain  and constipation.  Genitourinary: Negative.   Musculoskeletal: Negative.   Psychiatric/Behavioral:  Negative for behavioral problems and dysphoric mood.     Objective:  BP 122/78   Pulse 97   Ht 4\' 11"  (1.499 m)   Wt 172 lb (78 kg)   LMP 10/08/2015 (Exact Date)   SpO2 98%   BMI 34.74 kg/m      03/22/2023    4:03 PM 01/26/2023   11:09 AM 11/18/2022   12:13 AM  BP/Weight  Systolic BP 122 122 124  Diastolic BP 78 79 89  Wt. (Lbs) 172 174.6   BMI 34.74 kg/m2 35.26 kg/m2       Physical Exam Constitutional:      Appearance: She is well-developed.  Cardiovascular:     Rate and Rhythm: Normal rate.     Heart sounds: Normal heart sounds. No murmur heard. Pulmonary:     Effort: Pulmonary effort is normal.     Breath sounds: Normal breath sounds. No wheezing or rales.  Chest:     Chest wall: No tenderness.  Abdominal:     General: Bowel sounds are normal. There is no distension.     Palpations: Abdomen is soft. There is no mass.     Tenderness: There is no abdominal tenderness.  Musculoskeletal:        General: Normal range of motion.     Right lower leg: No edema.     Left lower leg: No edema.  Neurological:     Mental Status: She is alert and oriented to person, place, and time.  Psychiatric:        Mood and Affect: Mood normal.        Latest Ref Rng & Units 01/26/2023    1:43 PM 11/17/2022    2:28 PM 01/13/2022   11:10 AM  CMP  Glucose 70 - 99 mg/dL 161  096  045   BUN 8 - 27 mg/dL 11  9  9    Creatinine 0.57 - 1.00 mg/dL 4.09  8.11  9.14   Sodium 134 - 144 mmol/L 144  138  141   Potassium 3.5 - 5.2 mmol/L 4.3  3.7  4.5   Chloride 96 - 106 mmol/L 104  104  103   CO2 20 - 29 mmol/L 24  25  25   Calcium 8.7 - 10.3 mg/dL 9.7  8.9  9.6   Total Protein 6.0 - 8.5 g/dL 7.1   7.4   Total Bilirubin 0.0 - 1.2 mg/dL 0.3   0.6   Alkaline Phos 44 - 121 IU/L 136   148   AST 0 - 40 IU/L 28   19   ALT 0 - 32 IU/L 53   22     Lipid Panel     Component Value Date/Time    CHOL 146 01/13/2022 1110   TRIG 127 01/13/2022 1110   HDL 46 01/13/2022 1110   CHOLHDL 3.8 12/13/2017 1150   CHOLHDL 5.3 (H) 11/23/2016 0952   VLDL 61 (H) 11/23/2016 0952   LDLCALC 77 01/13/2022 1110    CBC    Component Value Date/Time   WBC 7.2 01/26/2023 1343   WBC 6.5 11/17/2022 1428   RBC 4.87 01/26/2023 1343   RBC 4.59 11/17/2022 1428   HGB 15.2 01/26/2023 1343   HGB 14.9 10/08/2013 0853   HCT 45.5 01/26/2023 1343   HCT 43.2 10/08/2013 0853   PLT 280 01/26/2023 1343   MCV 93 01/26/2023 1343   MCV 92.8 10/08/2013 0853   MCH 31.2 01/26/2023 1343   MCH 31.4 11/17/2022 1428   MCHC 33.4 01/26/2023 1343   MCHC 33.6 11/17/2022 1428   RDW 12.7 01/26/2023 1343   RDW 12.8 10/08/2013 0853   LYMPHSABS 1.8 01/26/2023 1343   LYMPHSABS 2.1 10/08/2013 0853   MONOABS 0.4 03/07/2018 1046   MONOABS 0.4 10/08/2013 0853   EOSABS 0.1 01/26/2023 1343   BASOSABS 0.1 01/26/2023 1343   BASOSABS 0.1 10/08/2013 0853    Lab Results  Component Value Date   HGBA1C 5.8 (H) 01/26/2023    Lab Results  Component Value Date   TSH 1.270 10/05/2020    Assessment & Plan:  1. Epigastric pain Abdominal exam is unrevealing MRI abdomen from 2022 revealed hepatic steatosis Continue PPI Will check for H. pylori antibody - H. pylori breath test  2. Other fatigue Will workup for possible underlying causes - VITAMIN D 25 Hydroxy (Vit-D Deficiency, Fractures) - TSH - T4, free - CBC with Differential/Platelet  3. Hemorrhoids, unspecified hemorrhoid type Counseled on working to avoid constipation Encouraged to perform sitz bath - hydrocortisone-pramoxine (ANALPRAM HC) 2.5-1 % rectal cream; Place 1 Application rectally 3 (three) times daily.  Dispense: 30 g; Refill: 2    Meds ordered this encounter  Medications   hydrocortisone-pramoxine (ANALPRAM HC) 2.5-1 % rectal cream    Sig: Place 1 Application rectally 3 (three) times daily.    Dispense:  30 g    Refill:  2    Follow-up: Return  in about 3 months (around 06/22/2023) for Chronic medical conditions.       Hoy Register, MD, FAAFP. [facility] [place], [place] [phone]   03/22/2023, 4:37 PM

## 2023-03-22 NOTE — Progress Notes (Signed)
Abdominal pain Hemorrhoids

## 2023-03-23 LAB — TSH: TSH: 1.64 u[IU]/mL (ref 0.450–4.500)

## 2023-03-23 LAB — CBC WITH DIFFERENTIAL/PLATELET
Basophils Absolute: 0 10*3/uL (ref 0.0–0.2)
Basos: 0 %
EOS (ABSOLUTE): 0 10*3/uL (ref 0.0–0.4)
Eos: 0 %
Hematocrit: 41.5 % (ref 34.0–46.6)
Hemoglobin: 13.8 g/dL (ref 11.1–15.9)
Immature Grans (Abs): 0.1 10*3/uL (ref 0.0–0.1)
Immature Granulocytes: 1 %
Lymphocytes Absolute: 1 10*3/uL (ref 0.7–3.1)
Lymphs: 10 %
MCH: 30.4 pg (ref 26.6–33.0)
MCHC: 33.3 g/dL (ref 31.5–35.7)
MCV: 91 fL (ref 79–97)
Monocytes Absolute: 0.5 10*3/uL (ref 0.1–0.9)
Monocytes: 5 %
Neutrophils Absolute: 8 10*3/uL — ABNORMAL HIGH (ref 1.4–7.0)
Neutrophils: 84 %
Platelets: 281 10*3/uL (ref 150–450)
RBC: 4.54 x10E6/uL (ref 3.77–5.28)
RDW: 14.4 % (ref 11.7–15.4)
WBC: 9.6 10*3/uL (ref 3.4–10.8)

## 2023-03-23 LAB — T4, FREE: Free T4: 2 ng/dL — ABNORMAL HIGH (ref 0.82–1.77)

## 2023-03-23 LAB — VITAMIN D 25 HYDROXY (VIT D DEFICIENCY, FRACTURES): Vit D, 25-Hydroxy: 35.9 ng/mL (ref 30.0–100.0)

## 2023-03-24 LAB — H. PYLORI BREATH TEST: H pylori Breath Test: NEGATIVE

## 2023-03-24 NOTE — Telephone Encounter (Signed)
Patient's friend is calling wanting to speak with a nurse regarding Dr Baxter Flattery recommendations regarding procedures. Please advise

## 2023-03-24 NOTE — Telephone Encounter (Signed)
The pt has been advised that she will need to keep appt to discuss any procedures or treatments using interpreter service.

## 2023-05-02 ENCOUNTER — Other Ambulatory Visit (HOSPITAL_COMMUNITY)
Admission: RE | Admit: 2023-05-02 | Discharge: 2023-05-02 | Disposition: A | Discharge status: Home / Self Care | Payer: Self-pay | Source: Ambulatory Visit | Attending: Family Medicine | Admitting: Family Medicine

## 2023-05-02 ENCOUNTER — Ambulatory Visit: Payer: Self-pay | Attending: Family Medicine | Admitting: Family Medicine

## 2023-05-02 ENCOUNTER — Ambulatory Visit (HOSPITAL_COMMUNITY)
Admission: RE | Admit: 2023-05-02 | Discharge: 2023-05-02 | Disposition: A | Discharge status: Home / Self Care | Payer: Self-pay | Source: Ambulatory Visit | Attending: Family Medicine | Admitting: Family Medicine

## 2023-05-02 ENCOUNTER — Other Ambulatory Visit: Payer: Self-pay

## 2023-05-02 ENCOUNTER — Encounter: Payer: Self-pay | Admitting: Family Medicine

## 2023-05-02 VITALS — BP 142/79 | HR 79 | Ht 59.0 in | Wt 171.4 lb

## 2023-05-02 DIAGNOSIS — R102 Pelvic and perineal pain: Secondary | ICD-10-CM

## 2023-05-02 DIAGNOSIS — E785 Hyperlipidemia, unspecified: Secondary | ICD-10-CM

## 2023-05-02 DIAGNOSIS — K219 Gastro-esophageal reflux disease without esophagitis: Secondary | ICD-10-CM

## 2023-05-02 DIAGNOSIS — I10 Essential (primary) hypertension: Secondary | ICD-10-CM

## 2023-05-02 MED ORDER — OMEPRAZOLE 40 MG PO CPDR
40.0000 mg | DELAYED_RELEASE_CAPSULE | Freq: Every day | ORAL | 1 refills | Status: DC
  Filled 2023-05-02: qty 90, 90d supply, fill #0

## 2023-05-02 MED ORDER — LISINOPRIL 10 MG PO TABS
10.0000 mg | ORAL_TABLET | Freq: Every day | ORAL | 1 refills | Status: DC
  Filled 2023-05-02: qty 90, 90d supply, fill #0

## 2023-05-02 MED ORDER — ATORVASTATIN CALCIUM 20 MG PO TABS
20.0000 mg | ORAL_TABLET | Freq: Every day | ORAL | 1 refills | Status: DC
  Filled 2023-05-02: qty 90, 90d supply, fill #0

## 2023-05-02 MED ORDER — MELOXICAM 7.5 MG PO TABS
7.5000 mg | ORAL_TABLET | Freq: Every day | ORAL | 1 refills | Status: AC
  Filled 2023-05-02: qty 90, 90d supply, fill #0

## 2023-05-02 NOTE — Patient Instructions (Signed)
Pelvic Pain, Female Pelvic pain is pain in your lower belly (abdomen), below your belly button and between your hips. The pain may: Start all of a sudden (be acute). Keep coming back (be recurring). Last a long time (become chronic). Pelvic pain that lasts longer than 6 months is called chronic pelvic pain. There are many causes of pelvic pain. Sometimes the cause of pelvic pain is not known. Follow these instructions at home:  Take over-the-counter and prescription medicines only as told by your doctor. Rest as told by your doctor. Do not have sex if it hurts. Keep a journal of your pelvic pain. Write down: When the pain started. Where the pain is located. What seems to make the pain better or worse, such as food or your monthly period (menstrual cycle). Any symptoms you have along with the pain. Keep all follow-up visits. Contact a doctor if: Medicine does not help your pain, or your pain comes back. You have new symptoms. You have unusual discharge or bleeding from your vagina. You have a fever or chills. You are having trouble pooping (constipation). You have blood in your pee (urine) or poop (stool). Your pee smells bad. You feel weak or light-headed. Get help right away if: You have sudden pain that is very bad. You have very bad pain and also have any of these symptoms: A fever. Feeling like you may vomit (nauseous). Vomiting. Being very sweaty. You faint. These symptoms may be an emergency. Get help right away. Call your local emergency services (911 in the [place] Do not wait to see if the symptoms will go away. Do not drive yourself to the hospital. Summary Pelvic pain is pain in your lower belly (abdomen), below your belly button and between your hips. There are many causes of pelvic pain. Keep a journal of your pelvic pain. This information is not intended to replace advice given to you by your health care provider. Make sure you discuss any questions you have with  your health care provider. Document Revised: 02/23/2021 Document Reviewed: 02/23/2021 Elsevier Patient Education  2024 [employer] Inc.  

## 2023-05-02 NOTE — Progress Notes (Unsigned)
Having pelvic pain.

## 2023-05-02 NOTE — Progress Notes (Unsigned)
Subjective:  Patient ID: Susan Lopez, female    DOB: 03-25-1961  Age: 62 y.o. MRN: [id number]  CC: Hypertension   HPI Susan Lopez is a 62 y.o. year old female with a history of hypertension, hyperlipidemia, history of D&C/hysteroscopy and MyoSure polypectomy in 01/2021, GERD.   Interval History:  She Complains of 1 year history of pelvic pain which is intermittent but absent at the moment. Prolonged standing worsens her symptoms. Tylenol provides some relief. She also complains that pain shoots into her vagina.  She denies presence of vaginal discharge, itching. While mopping the floor and moving she does notice pain is also worse in her pelvic region.  Endorses adherence with her statin and antihypertensive.          Past Medical History:  Diagnosis Date   Anxiety    Arthritis    Chronic gastritis    Chronic thoracic spine pain    Depression    GERD (gastroesophageal reflux disease)    Hemorrhoids, external 10-21-11   occ. bothersome   History of colon polyps    Hyperlipidemia    Hypertension    Iron deficiency anemia    OSA on CPAP    study in epic 08-29-2017 severe osa (AHI 32.5), per pt daughter uses nightly   PMB (postmenopausal bleeding)    PONV (postoperative nausea and vomiting)    Pre-diabetes     Past Surgical History:  Procedure Laterality Date   BREAST BIOPSY Left    CESAREAN SECTION  1985, 1992   CHOLECYSTECTOMY  10/27/2011   Procedure: LAPAROSCOPIC CHOLECYSTECTOMY WITH INTRAOPERATIVE CHOLANGIOGRAM;  Surgeon: Ardeth Sportsman, MD;  Location: [facility];  Service: General;  Laterality: N/A;  Laparoscopic Chole w/ IOC Single Site   COLONOSCOPY WITH PROPOFOL  last one 05-09-2018   DILATATION & CURETTAGE/HYSTEROSCOPY WITH MYOSURE N/A 03/11/2020   Procedure: DILATATION AND CURETTAGE /HYSTEROSCOPY WITH MYOSURE;  Surgeon: Catalina Antigua, MD;  Location: [facility];  Service: Gynecology;  Laterality: N/A;   DILATATION &  CURETTAGE/HYSTEROSCOPY WITH MYOSURE N/A 02/02/2021   Procedure: DILATATION & CURETTAGE/HYSTEROSCOPY WITH MYOSURE POLYPECTOMY;  Surgeon: Catalina Antigua, MD;  Location: [facility];  Service: Gynecology;  Laterality: N/A;   ESOPHAGOGASTRODUODENOSCOPY (EGD) WITH PROPOFOL  last one 12-30-2019  dr Myrtie Neither   EYE SURGERY  10/21/2011   bil. for tissue growth-laser surgery   HARDWARE REMOVAL Left 03/16/2016   Procedure: LEFT ANKLE HARDWARE REMOVAL;  Surgeon: Tarry Kos, MD;  Location: [facility];  Service: Orthopedics;  Laterality: Left;   MM BREAST STEREO BIOPSY LEFT ([facility] HX) Left 2015   ORIF ANKLE FRACTURE Left 11/13/2015   Procedure: OPEN REDUCTION INTERNAL FIXATION (ORIF) LEFT BIMALLEOLAR ANKLE FRACTURE;  Surgeon: Tarry Kos, MD;  Location: [facility];  Service: Orthopedics;  Laterality: Left;    Family History  Problem Relation Age of Onset   Hypertension Mother    Cancer Mother    Hypertension Father    Arthritis Brother    Hypertension Brother    Intellectual disability Paternal Aunt    Colon cancer Neg Hx     Social History   Socioeconomic History   Marital status: Single    Spouse name: Not on file   Number of children: 2   Years of education: Not on file   Highest education level: 4th grade  Occupational History   Occupation: unemployed  Tobacco Use   Smoking status: Former    Packs/day: 0.25    Years: 10.00  Additional pack years: 0.00    Total pack years: 2.50    Types: Cigarettes    Quit date: 2008    Years since quitting: 16.5   Smokeless tobacco: Never  Vaping Use   Vaping Use: Never used  Substance and Sexual Activity   Alcohol use: No   Drug use: Never   Sexual activity: Not Currently    Birth control/protection: Post-menopausal  Other Topics Concern   Not on file  Social History Narrative   Not on file   Social Determinants of Health   Financial Resource Strain: Not on file  Food Insecurity: No Food Insecurity  (05/26/2022)   Hunger Vital Sign    Worried About Running Out of Food in the Last Year: Never true    Ran Out of Food in the Last Year: Never true  Transportation Needs: Unmet Transportation Needs (05/26/2022)   PRAPARE - Transportation    Lack of Transportation (Medical): Yes    Lack of Transportation (Non-Medical): Yes  Physical Activity: Insufficiently Active (09/28/2017)   Exercise Vital Sign    Days of Exercise per Week: 7 days    Minutes of Exercise per Session: 20 min  Stress: No Stress Concern Present (09/28/2017)   [employer] of Occupational Health - Occupational Stress Questionnaire    Feeling of Stress : Only a little  Social Connections: Somewhat Isolated (09/28/2017)   Social Connection and Isolation Panel [NHANES]    Frequency of Communication with Friends and Family: More than three times a week    Frequency of Social Gatherings with Friends and Family: Once a week    Attends Religious Services: More than 4 times per year    Active Member of [employer] or Organizations: No    Attends Banker Meetings: Never    Marital Status: Separated    Allergies  Allergen Reactions   Hydrocodone Nausea And Vomiting   Tramadol Nausea Only   Oxycodone Rash    Outpatient Medications Prior to Visit  Medication Sig Dispense Refill   albuterol (VENTOLIN HFA) 108 (90 Base) MCG/ACT inhaler Inhale 2 puffs into the lungs every 6 (six) hours as needed for wheezing or shortness of breath. 8.5 g 0   fluticasone (FLONASE) 50 MCG/ACT nasal spray Place 2 sprays into both nostrils daily. 16 g 6   hydrocortisone-pramoxine (ANALPRAM HC) 2.5-1 % rectal cream Place 1 Application rectally 3 (three) times daily. 30 g 2   linaclotide (LINZESS) 72 MCG capsule Take 1 capsule (72 mcg total) by mouth daily before breakfast. 30 capsule 1   Multiple Vitamin (MULTIVITAMIN WITH MINERALS) TABS tablet Take 1 tablet by mouth daily.     polyethylene glycol powder (GLYCOLAX/MIRALAX) 17 GM/SCOOP  powder Take 17 g by mouth daily. As needed for constipation relief 3350 g 2   psyllium (METAMUCIL) 58.6 % powder Take 1 packet by mouth as needed. 2 x weekly     simethicone (GAS-X) 80 MG chewable tablet Chew 2 tablets (160 mg total) by mouth every 6 (six) hours as needed for flatulence. Gas/bloating 60 tablet 0   UNABLE TO FIND Calcium, magnesium & zinc (all in 1), once daily     gabapentin (NEURONTIN) 100 MG capsule TAKE 1 CAPSULE BY MOUTH 2 TIMES DAILY. 180 capsule 1   oxyCODONE-acetaminophen (PERCOCET/ROXICET) 5-325 MG tablet Take 1 tablet by mouth every 4 (four) hours as needed. (Patient not taking: Reported on 03/22/2023) 15 tablet 0   Probiotic Product (PROBIOTIC PO) Take by mouth daily. (Patient not taking: Reported on  05/02/2023)     atorvastatin (LIPITOR) 20 MG tablet Take 1 tablet (20 mg total) by mouth daily. (Patient not taking: Reported on 05/02/2023) 90 tablet 1   lisinopril (ZESTRIL) 10 MG tablet Take 1 tablet (10 mg total) by mouth daily. (Patient not taking: Reported on 05/02/2023) 90 tablet 1   omeprazole (PRILOSEC) 40 MG capsule TOME 1 PASTILLA POR VIA ORAL DOS VECES AL DIA A REDUCIR EL ACIDO DEL ESTOMAGO (Patient not taking: Reported on 05/02/2023) 90 capsule 1   No facility-administered medications prior to visit.     ROS Review of Systems  Constitutional:  Negative for activity change and appetite change.  HENT:  Negative for sinus pressure and sore throat.   Respiratory:  Negative for chest tightness, shortness of breath and wheezing.   Cardiovascular:  Negative for chest pain and palpitations.  Gastrointestinal:  Negative for abdominal distention, abdominal pain and constipation.  Genitourinary: Negative.   Musculoskeletal: Negative.   Psychiatric/Behavioral:  Negative for behavioral problems and dysphoric mood.     Objective:  BP (!) 142/79   Pulse 79   Ht 4\' 11"  (1.499 m)   Wt 171 lb 6.4 oz (77.7 kg)   LMP 10/08/2015 (Exact Date)   SpO2 97%   BMI 34.62 kg/m       05/02/2023   12:15 PM 05/02/2023   11:33 AM 03/22/2023    4:03 PM  BP/Weight  Systolic BP 142 147 122  Diastolic BP 79 80 78  Wt. (Lbs)  171.4 172  BMI  34.62 kg/m2 34.74 kg/m2      Physical Exam Constitutional:      Appearance: She is well-developed.  Cardiovascular:     Rate and Rhythm: Normal rate.     Heart sounds: Normal heart sounds. No murmur heard. Pulmonary:     Effort: Pulmonary effort is normal.     Breath sounds: Normal breath sounds. No wheezing or rales.  Chest:     Chest wall: No tenderness.  Abdominal:     General: Bowel sounds are normal. There is no distension.     Palpations: Abdomen is soft. There is no mass.     Tenderness: There is abdominal tenderness (TTP lower abdomen and pelvic region).     Comments: Healed vertical lower abdominal scar  Musculoskeletal:        General: Normal range of motion.     Right lower leg: No edema.     Left lower leg: No edema.  Neurological:     Mental Status: She is alert and oriented to person, place, and time.  Psychiatric:        Mood and Affect: Mood normal.        Latest Ref Rng & Units 01/26/2023    1:43 PM 11/17/2022    2:28 PM 01/13/2022   11:10 AM  CMP  Glucose 70 - 99 mg/dL 161  096  045   BUN 8 - 27 mg/dL 11  9  9    Creatinine 0.57 - 1.00 mg/dL 4.09  8.11  9.14   Sodium 134 - 144 mmol/L 144  138  141   Potassium 3.5 - 5.2 mmol/L 4.3  3.7  4.5   Chloride 96 - 106 mmol/L 104  104  103   CO2 20 - 29 mmol/L 24  25  25    Calcium 8.7 - 10.3 mg/dL 9.7  8.9  9.6   Total Protein 6.0 - 8.5 g/dL 7.1   7.4   Total Bilirubin 0.0 - 1.2  mg/dL 0.3   0.6   Alkaline Phos 44 - 121 IU/L 136   148   AST 0 - 40 IU/L 28   19   ALT 0 - 32 IU/L 53   22     Lipid Panel     Component Value Date/Time   CHOL 146 01/13/2022 1110   TRIG 127 01/13/2022 1110   HDL 46 01/13/2022 1110   CHOLHDL 3.8 12/13/2017 1150   CHOLHDL 5.3 (H) 11/23/2016 0952   VLDL 61 (H) 11/23/2016 0952   LDLCALC 77 01/13/2022 1110    CBC     Component Value Date/Time   WBC 9.6 03/22/2023 1704   WBC 6.5 11/17/2022 1428   RBC 4.54 03/22/2023 1704   RBC 4.59 11/17/2022 1428   HGB 13.8 03/22/2023 1704   HGB 14.9 10/08/2013 0853   HCT 41.5 03/22/2023 1704   HCT 43.2 10/08/2013 0853   PLT 281 03/22/2023 1704   MCV 91 03/22/2023 1704   MCV 92.8 10/08/2013 0853   MCH 30.4 03/22/2023 1704   MCH 31.4 11/17/2022 1428   MCHC 33.3 03/22/2023 1704   MCHC 33.6 11/17/2022 1428   RDW 14.4 03/22/2023 1704   RDW 12.8 10/08/2013 0853   LYMPHSABS 1.0 03/22/2023 1704   LYMPHSABS 2.1 10/08/2013 0853   MONOABS 0.4 03/07/2018 1046   MONOABS 0.4 10/08/2013 0853   EOSABS 0.0 03/22/2023 1704   BASOSABS 0.0 03/22/2023 1704   BASOSABS 0.1 10/08/2013 0853    Lab Results  Component Value Date   HGBA1C 5.8 (H) 01/26/2023    Assessment & Plan:  1. Dyslipidemia Controlled Low-cholesterol diet - atorvastatin (LIPITOR) 20 MG tablet; Take 1 tablet (20 mg total) by mouth daily.  Dispense: 90 tablet; Refill: 1  2. Essential hypertension Minimally above goal Will make no regimen changes today Counseled on blood pressure goal of less than 130/80, low-sodium, DASH diet, medication compliance, 150 minutes of moderate intensity exercise per week. Discussed medication compliance, adverse effects. - lisinopril (ZESTRIL) 10 MG tablet; Take 1 tablet (10 mg total) by mouth daily.  Dispense: 90 tablet; Refill: 1  3. Gastroesophageal reflux disease, unspecified whether esophagitis present Stable - omeprazole (PRILOSEC) 40 MG capsule; TOME 1 PASTILLA POR VIA ORAL DOS VECES AL DIA A REDUCIR EL ACIDO DEL ESTOMAGO  Dispense: 90 capsule; Refill: 1  4. Vaginal pain - Cervicovaginal ancillary only  5. Pelvic pain Previous history of  D&C/hysteroscopy and MyoSure polypectomy in 01/2021 Pain could be as a result of previous scar tissue. - DG Pelvis 1-2 Views; Future - meloxicam (MOBIC) 7.5 MG tablet; Take 1 tablet (7.5 mg total) by mouth daily.  Dispense:  90 tablet; Refill: 1                 Meds ordered this encounter  Medications   atorvastatin (LIPITOR) 20 MG tablet    Sig: Take 1 tablet (20 mg total) by mouth daily.    Dispense:  90 tablet    Refill:  1   lisinopril (ZESTRIL) 10 MG tablet    Sig: Take 1 tablet (10 mg total) by mouth daily.    Dispense:  90 tablet    Refill:  1   omeprazole (PRILOSEC) 40 MG capsule    Sig: TOME 1 PASTILLA POR VIA ORAL DOS VECES AL DIA A REDUCIR EL ACIDO DEL ESTOMAGO    Dispense:  90 capsule    Refill:  1   meloxicam (MOBIC) 7.5 MG tablet    Sig: Take 1 tablet (  7.5 mg total) by mouth daily.    Dispense:  90 tablet    Refill:  1    Follow-up: Return in about 6 months (around 11/02/2023) for Chronic medical conditions.       Hoy Register, MD, FAAFP. [facility] [place], [place] [phone]   05/03/2023, 1:08 PM

## 2023-05-03 LAB — CERVICOVAGINAL ANCILLARY ONLY
Bacterial Vaginitis (gardnerella): NEGATIVE
Candida Glabrata: NEGATIVE
Candida Vaginitis: NEGATIVE
Chlamydia: NEGATIVE
Comment: NEGATIVE
Comment: NEGATIVE
Comment: NEGATIVE
Comment: NEGATIVE
Comment: NEGATIVE
Comment: NORMAL
Neisseria Gonorrhea: NEGATIVE
Trichomonas: NEGATIVE

## 2023-06-05 ENCOUNTER — Ambulatory Visit: Payer: Self-pay | Admitting: Physician Assistant

## 2023-08-28 ENCOUNTER — Ambulatory Visit (HOSPITAL_COMMUNITY)
Admission: EM | Admit: 2023-08-28 | Discharge: 2023-08-28 | Disposition: A | Discharge status: Home / Self Care | Payer: Self-pay | Attending: Family Medicine | Admitting: Family Medicine

## 2023-08-28 ENCOUNTER — Encounter (HOSPITAL_COMMUNITY): Payer: Self-pay | Admitting: *Deleted

## 2023-08-28 DIAGNOSIS — R200 Anesthesia of skin: Secondary | ICD-10-CM | POA: Insufficient documentation

## 2023-08-28 DIAGNOSIS — R202 Paresthesia of skin: Secondary | ICD-10-CM | POA: Insufficient documentation

## 2023-08-28 DIAGNOSIS — R290 Tetany: Secondary | ICD-10-CM | POA: Insufficient documentation

## 2023-08-28 LAB — COMPREHENSIVE METABOLIC PANEL
ALT: 28 U/L (ref 0–44)
AST: 20 U/L (ref 15–41)
Albumin: 3.6 g/dL (ref 3.5–5.0)
Alkaline Phosphatase: 104 U/L (ref 38–126)
Anion gap: 10 (ref 5–15)
BUN: 5 mg/dL — ABNORMAL LOW (ref 8–23)
CO2: 24 mmol/L (ref 22–32)
Calcium: 8.7 mg/dL — ABNORMAL LOW (ref 8.9–10.3)
Chloride: 106 mmol/L (ref 98–111)
Creatinine, Ser: 0.58 mg/dL (ref 0.44–1.00)
GFR, Estimated: >60 mL/min (ref >60)
Glucose, Bld: 114 mg/dL — ABNORMAL HIGH (ref 70–99)
Potassium: 3.7 mmol/L (ref 3.5–5.1)
Sodium: 140 mmol/L (ref 135–145)
Total Bilirubin: 0.4 mg/dL (ref 0.3–1.2)
Total Protein: 6.9 g/dL (ref 6.5–8.1)

## 2023-08-28 LAB — CBC
HCT: 42.8 % (ref 36.0–46.0)
Hemoglobin: 14.2 g/dL (ref 12.0–15.0)
MCH: 30.3 pg (ref 26.0–34.0)
MCHC: 33.2 g/dL (ref 30.0–36.0)
MCV: 91.5 fL (ref 80.0–100.0)
Platelets: 264 10*3/uL (ref 150–400)
RBC: 4.68 MIL/uL (ref 3.87–5.11)
RDW: 12 % (ref 11.5–15.5)
WBC: 6.8 10*3/uL (ref 4.0–10.5)
nRBC: 0 % (ref 0.0–0.2)

## 2023-08-28 LAB — TSH: TSH: 1.35 u[IU]/mL (ref 0.350–4.500)

## 2023-08-28 LAB — VITAMIN B12: Vitamin B-12: 450 pg/mL (ref 180–914)

## 2023-08-28 NOTE — ED Provider Notes (Signed)
[facility]    CSN: [id number] Arrival date & time: 08/28/23  1732      History   Chief Complaint Chief Complaint  Patient presents with   Numbness   Headache    HPI Susan Lopez is a 62 y.o. female.    Headache Here for headache and muscle twitching and spasms in her right face.  Symptoms began about 1 week ago.  She feels numb on the right side of her face but does not have any trouble smiling or moving her facial muscles and she does not have any trouble keeping her eyes closed if she needs to.  Her muscle twitches some in her cheek just below her right eye and sometimes her right side of her face will draw up and spasm.  No fever and no cough or congestion.  She does note some ear pain but it turns out that is a chronic problem  She notes some right side/flank pain, but no dysuria.  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  That did improve and resolved.  She has prediabetes and hypertension.  She states Tylenol is helping her headache  No syncope and no fall.  Past Medical History:  Diagnosis Date   Anxiety    Arthritis    Chronic gastritis    Chronic thoracic spine pain    Depression    GERD (gastroesophageal reflux disease)    Hemorrhoids, external 10-21-11   occ. bothersome   History of colon polyps    Hyperlipidemia    Hypertension    Iron deficiency anemia    OSA on CPAP    study in epic 08-29-2017 severe osa (AHI 32.5), per pt daughter uses nightly   PMB (postmenopausal bleeding)    PONV (postoperative nausea and vomiting)    Pre-diabetes     Patient Active Problem List   Diagnosis Date Noted   Mild intermittent asthma with acute exacerbation 01/13/2022   Allergic rhinitis with postnasal drip 01/13/2022   Acute recurrent sinusitis 01/13/2022   ACE-inhibitor cough 01/13/2022   Atherosclerosis of aorta (HCC) 05/18/2021    Endometrial mass    Snoring 07/05/2017   OSA (obstructive sleep apnea) 07/05/2017   Submucous leiomyoma of uterus 02/08/2017   Left eye injury 11/23/2016   Urinary incontinence in female 05/19/2016   Bimalleolar fracture of left ankle 11/13/2015   S/P ORIF (open reduction internal fixation) fracture 11/13/2015   Healthcare maintenance 10/08/2015   Postmenopausal bleeding 10/08/2015   Pain in joint, ankle and foot 10/08/2015   Headache disorder 10/08/2015   Atypical chest pain 10/08/2015   Essential hypertension 10/08/2015   Neck pain on right side 08/12/2015   Grief reaction 03/09/2015   Chronic thoracic spine pain 12/11/2014   Depression 08/12/2014   Dyslipidemia 08/12/2014   Headache 07/17/2014   UTI (urinary tract infection) 04/16/2014   Cystocele with rectocele 04/16/2014   Hyperlipidemia with target low density lipoprotein (LDL) cholesterol less than 100 mg/dL 08/65/7846   Breast lump on left side at 12 o'clock position 10/22/2013   Chronic constipation 10/09/2013   Bone lesion 10/04/2013   Mammogram abnormal 10/04/2013   Abdominal pain 10/04/2013   Essential hypertension 06/10/2013   Chest pain, unspecified 06/10/2013   Chronic throat pain 06/10/2013   Back pain 11/14/2011   Obesity (BMI 30-39.9) 11/14/2011   GERD (gastroesophageal reflux disease) 09/19/2011   Calculus of gallbladder with  acute and chronic cholecystitis without obstruction, s/p lap chole [id number] 09/19/2011   Hemorrhoids, internal, with bleeding 09/19/2011   ANEMIA-IRON DEFICIENCY 03/19/2009   CHEST PAIN, ATYPICAL 03/19/2009    Past Surgical History:  Procedure Laterality Date   BREAST BIOPSY Left    CESAREAN SECTION  1985, 1992   CHOLECYSTECTOMY  10/27/2011   Procedure: LAPAROSCOPIC CHOLECYSTECTOMY WITH INTRAOPERATIVE CHOLANGIOGRAM;  Surgeon: Ardeth Sportsman, MD;  Location: [facility];  Service: General;  Laterality: N/A;  Laparoscopic Chole w/ IOC Single Site   COLONOSCOPY WITH PROPOFOL  last one  05-09-2018   DILATATION & CURETTAGE/HYSTEROSCOPY WITH MYOSURE N/A 03/11/2020   Procedure: DILATATION AND CURETTAGE /HYSTEROSCOPY WITH MYOSURE;  Surgeon: Catalina Antigua, MD;  Location: [facility];  Service: Gynecology;  Laterality: N/A;   DILATATION & CURETTAGE/HYSTEROSCOPY WITH MYOSURE N/A 02/02/2021   Procedure: DILATATION & CURETTAGE/HYSTEROSCOPY WITH MYOSURE POLYPECTOMY;  Surgeon: Catalina Antigua, MD;  Location: [facility];  Service: Gynecology;  Laterality: N/A;   ESOPHAGOGASTRODUODENOSCOPY (EGD) WITH PROPOFOL  last one 12-30-2019  dr Myrtie Neither   EYE SURGERY  10/21/2011   bil. for tissue growth-laser surgery   HARDWARE REMOVAL Left 03/16/2016   Procedure: LEFT ANKLE HARDWARE REMOVAL;  Surgeon: Tarry Kos, MD;  Location: [facility];  Service: Orthopedics;  Laterality: Left;   MM BREAST STEREO BIOPSY LEFT ([facility] HX) Left 2015   ORIF ANKLE FRACTURE Left 11/13/2015   Procedure: OPEN REDUCTION INTERNAL FIXATION (ORIF) LEFT BIMALLEOLAR ANKLE FRACTURE;  Surgeon: Tarry Kos, MD;  Location[facility];  Service: Orthopedics;  Laterality: Left;    OB History     Gravida  4   Para  3   Term  3   Preterm  0   AB  1   Living  2      SAB  1   IAB  0   Ectopic  0   Multiple  1   Live Births               Home Medications    Prior to Admission medications   Medication Sig Start Date End Date Taking? Authorizing Provider  atorvastatin (LIPITOR) 20 MG tablet Take 1 tablet (20 mg total) by mouth daily. 05/02/23  Yes Hoy Register, MD  lisinopril (ZESTRIL) 10 MG tablet Take 1 tablet (10 mg total) by mouth daily. 05/02/23  Yes Newlin, Odette Horns, MD  gabapentin (NEURONTIN) 100 MG capsule TAKE 1 CAPSULE BY MOUTH 2 TIMES DAILY. 02/21/22 02/21/23  Hoy Register, MD  meloxicam (MOBIC) 7.5 MG tablet Take 1 tablet (7.5 mg total) by mouth daily. 05/02/23   Hoy Register, MD    Family History Family History  Problem Relation Age of Onset    Hypertension Mother    Cancer Mother    Hypertension Father    Arthritis Brother    Hypertension Brother    Intellectual disability Paternal Aunt    Colon cancer Neg Hx     Social History Social History   Tobacco Use   Smoking status: Former    Current packs/day: 0.00    Average packs/day: 0.3 packs/day for 10.0 years (2.5 ttl pk-yrs)    Types: Cigarettes    Start date: 21    Quit date: 2008    Years since quitting: 16.8   Smokeless tobacco: Never  Vaping Use   Vaping status: Never Used  Substance Use Topics   Alcohol use: No   Drug use: Never     Allergies   Hydrocodone,  Tramadol, and Oxycodone   Review of Systems Review of Systems  Neurological:  Positive for headaches.     Physical Exam Triage Vital Signs ED Triage Vitals [08/28/23 1903]  Encounter Vitals Group     BP 115/78     Systolic BP Percentile      Diastolic BP Percentile      Pulse Rate 79     Resp 18     Temp 98.2 F (36.8 C)     Temp Source Oral     SpO2 96 %     Weight      Height      Head Circumference      Peak Flow      Pain Score      Pain Loc      Pain Education      Exclude from Growth Chart    No data found.  Updated Vital Signs BP 115/78 (BP Location: Right Arm)   Pulse 79   Temp 98.2 F (36.8 C) (Oral)   Resp 18   LMP 10/08/2015 (Exact Date)   SpO2 96%   Visual Acuity Right Eye Distance:   Left Eye Distance:   Bilateral Distance:    Right Eye Near:   Left Eye Near:    Bilateral Near:     Physical Exam Vitals reviewed.  Constitutional:      General: She is not in acute distress.    Appearance: She is not toxic-appearing.  HENT:     Head:     Comments: There is no facial droop.  She is able to keep her eyes closed against resistance.  She wrinkles her forehead without trouble bilaterally.    Right Ear: Ear canal normal.     Left Ear: Tympanic membrane and ear canal normal.     Ears:     Comments: Right tympanic membrane is gray and dull.  There is no  erythema    Nose: Nose normal.     Mouth/Throat:     Mouth: Mucous membranes are moist.     Pharynx: No oropharyngeal exudate or posterior oropharyngeal erythema.  Eyes:     Extraocular Movements: Extraocular movements intact.     Conjunctiva/sclera: Conjunctivae normal.     Pupils: Pupils are equal, round, and reactive to light.  Cardiovascular:     Rate and Rhythm: Normal rate and regular rhythm.     Heart sounds: No murmur heard. Pulmonary:     Effort: Pulmonary effort is normal. No respiratory distress.     Breath sounds: No stridor. No wheezing, rhonchi or rales.  Abdominal:     Palpations: Abdomen is soft.     Tenderness: There is no abdominal tenderness.  Musculoskeletal:     Cervical back: Neck supple.  Lymphadenopathy:     Cervical: No cervical adenopathy.  Skin:    Capillary Refill: Capillary refill takes less than 2 seconds.     Coloration: Skin is not jaundiced or pale.  Neurological:     General: No focal deficit present.     Mental Status: She is alert and oriented to person, place, and time.     Cranial Nerves: No cranial nerve deficit.     Motor: No weakness.     Coordination: Coordination normal.     Gait: Gait normal.     Deep Tendon Reflexes: Reflexes normal.     Comments: Possibly there is decreased light touch sensation to the right cheek.  Psychiatric:  Behavior: Behavior normal.      UC Treatments / Results  Labs (all labs ordered are listed, but only abnormal results are displayed) Labs Reviewed  CBC  COMPREHENSIVE METABOLIC PANEL  TSH  VITAMIN B12    EKG   Radiology No results found.  Procedures Procedures (including critical care time)  Medications Ordered in UC Medications - No data to display  Initial Impression / Assessment and Plan / UC Course  I have reviewed the triage vital signs and the nursing notes.  Pertinent labs & imaging results that were available during my care of the patient were reviewed by me and  considered in my medical decision making (see chart for details).     I discussed with her that she does not have any paralysis symptoms at this time.  I do not think she is having a major CVA and she is not having Bell's palsy.  I discussed with her that if she had come to [name] within the first 24 hours of having the symptoms I would probably have asked her to go to the emergency room for advanced imaging such.  Since this is a week into her symptoms, we are going to do blood work to assess.  She is having some numbness and tingling in her fingers also. CBC, CMP, TSH, and B12 are drawn today.  We will notify her of any significant abnormalities.   She will continue to take Tylenol as needed for the headache.  She does have primary care and can follow-up with them If she worsens anyway she is to go to the emergency room  Final Clinical Impressions(s) / UC Diagnoses   Final diagnoses:  Numbness  Paresthesia  Tetany     Discharge Instructions      We have drawn blood to check for anemia, thyroid function and kidney and liver function, electrolytes, and B12 level.  Our staff will notify you if anything is significantly abnormal. (Le hemos extrado sangre para comprobar si hay anemia, funcin tiroidea y funcin renal y heptica, electrolitos y nivel de B12.  Nuestro Gaffer notificar si hay algo significativamente anormal.)  Continue to take Tylenol as needed.   (Contine tomando Tylenol segn sea necesario.  )  If you worsen in any way, please go to the emergency room. (Si empeora de Ubaldo Glassing, por favor vaya a urgencias.)  Please follow-up with your primary care provider about this issue. (Haga un seguimiento con su proveedor de atencin primaria sobre este problema.)       ED Prescriptions   None    PDMP not reviewed this encounter.   Zenia Resides, MD 08/28/23 [phone]

## 2023-08-28 NOTE — ED Triage Notes (Addendum)
Pt states that she has right sided facial numbness and headache only on the right side X 1 week. She states that her face starts twitching some too. She hasn't taken any pain meds fo rthe headache.   Pt states this happened years ago and she was dx with facial paralysis which she needed physical therapy to correct.

## 2023-08-28 NOTE — Discharge Instructions (Addendum)
We have drawn blood to check for anemia, thyroid function and kidney and liver function, electrolytes, and B12 level.  Our staff will notify you if anything is significantly abnormal. (Le hemos extrado sangre para comprobar si hay anemia, funcin tiroidea y funcin renal y heptica, electrolitos y nivel de B12.  Nuestro Gaffer notificar si hay algo significativamente anormal.)  Continue to take Tylenol as needed.   (Contine tomando Tylenol segn sea necesario.  )  If you worsen in any way, please go to the emergency room. (Si empeora de Susan Lopez, por favor vaya a urgencias.)  Please follow-up with your primary care provider about this issue. Government social research officer un seguimiento con su proveedor de atencin primaria sobre este problema.)

## 2023-11-07 ENCOUNTER — Other Ambulatory Visit: Payer: Self-pay | Admitting: Family Medicine

## 2023-11-07 ENCOUNTER — Other Ambulatory Visit: Payer: Self-pay

## 2023-11-07 ENCOUNTER — Ambulatory Visit: Payer: Self-pay | Attending: Family Medicine | Admitting: Family Medicine

## 2023-11-07 VITALS — BP 140/79 | HR 85 | Ht 59.0 in | Wt 170.8 lb

## 2023-11-07 DIAGNOSIS — R109 Unspecified abdominal pain: Secondary | ICD-10-CM

## 2023-11-07 DIAGNOSIS — G8929 Other chronic pain: Secondary | ICD-10-CM

## 2023-11-07 DIAGNOSIS — R7303 Prediabetes: Secondary | ICD-10-CM

## 2023-11-07 DIAGNOSIS — N2889 Other specified disorders of kidney and ureter: Secondary | ICD-10-CM

## 2023-11-07 DIAGNOSIS — I10 Essential (primary) hypertension: Secondary | ICD-10-CM

## 2023-11-07 DIAGNOSIS — Z1211 Encounter for screening for malignant neoplasm of colon: Secondary | ICD-10-CM

## 2023-11-07 DIAGNOSIS — K219 Gastro-esophageal reflux disease without esophagitis: Secondary | ICD-10-CM

## 2023-11-07 DIAGNOSIS — N281 Cyst of kidney, acquired: Secondary | ICD-10-CM

## 2023-11-07 DIAGNOSIS — E785 Hyperlipidemia, unspecified: Secondary | ICD-10-CM

## 2023-11-07 LAB — POCT URINALYSIS DIP (CLINITEK)
Bilirubin, UA: NEGATIVE
Glucose, UA: NEGATIVE mg/dL
Ketones, POC UA: NEGATIVE mg/dL
Leukocytes, UA: NEGATIVE
Nitrite, UA: NEGATIVE
POC PROTEIN,UA: NEGATIVE
Spec Grav, UA: 1.02 (ref 1.010–1.025)
Urobilinogen, UA: 0.2 U/dL
pH, UA: 6.5 (ref 5.0–8.0)

## 2023-11-07 MED ORDER — ATORVASTATIN CALCIUM 20 MG PO TABS
20.0000 mg | ORAL_TABLET | Freq: Every day | ORAL | 1 refills | Status: DC
  Filled 2023-11-07: qty 90, 90d supply, fill #0

## 2023-11-07 MED ORDER — LISINOPRIL 10 MG PO TABS
10.0000 mg | ORAL_TABLET | Freq: Every day | ORAL | 1 refills | Status: DC
  Filled 2023-11-07: qty 90, 90d supply, fill #0
  Filled 2024-05-06: qty 90, 90d supply, fill #1

## 2023-11-07 NOTE — Progress Notes (Signed)
 Subjective:  Patient ID: Susan Lopez, female    DOB: [birth year]  Age: 63 y.o. MRN: [id number]  CC: Medical Management of Chronic Issues   HPI Susan Lopez is a 63 y.o. year old female with a history of hypertension, hyperlipidemia, history of D&C/hysteroscopy and MyoSure polypectomy in 01/2021, GERD.     Interval History: Discussed the use of AI scribe software for clinical note transcription with the patient, who gave verbal consent to proceed.  She presents with right-sided pain that has been persistent for two to three weeks. The pain is described as starting from the back and radiating to the front. The patient reports a history of a small cyst in the right kidney, diagnosed in 2022. The patient denies any hematuria.  CT abdomen and pelvis from 01/2021 revealed: IMPRESSION: 1. No acute findings in the abdomen or pelvis. 2. Hepatic steatosis with stable hepatic hemangioma. 3. Small intermediate density lesion only minimally changed since 2015 arising from the lower pole of the RIGHT kidney, may represent a cyst having undergone hemorrhagic transformation. Consider 6-12 month follow-up abdominal MRI with and without contrast for complete assessment. 4. Aortic atherosclerosis.   She is currently on medication for blood pressure and cholesterol, which she takes at night.       Past Medical History:  Diagnosis Date   Anxiety    Arthritis    Chronic gastritis    Chronic thoracic spine pain    Depression    GERD (gastroesophageal reflux disease)    Hemorrhoids, external 10-21-11   occ. bothersome   History of colon polyps    Hyperlipidemia    Hypertension    Iron deficiency anemia    OSA on CPAP    study in epic 08-29-2017 severe osa (AHI 32.5), per pt daughter uses nightly   PMB (postmenopausal bleeding)    PONV (postoperative nausea and vomiting)    Pre-diabetes     Past Surgical History:  Procedure Laterality Date   BREAST BIOPSY Left     CESAREAN SECTION  1985, 1992   CHOLECYSTECTOMY  10/27/2011   Procedure: LAPAROSCOPIC CHOLECYSTECTOMY WITH INTRAOPERATIVE CHOLANGIOGRAM;  Surgeon: Elspeth KYM Schultze, MD;  Location: [facility];  Service: General;  Laterality: N/A;  Laparoscopic Chole w/ IOC Single Site   COLONOSCOPY WITH PROPOFOL   last one 05-09-2018   DILATATION & CURETTAGE/HYSTEROSCOPY WITH MYOSURE N/A 03/11/2020   Procedure: DILATATION AND CURETTAGE /HYSTEROSCOPY WITH MYOSURE;  Surgeon: Alger Gong, MD;  Location: [facility];  Service: Gynecology;  Laterality: N/A;   DILATATION & CURETTAGE/HYSTEROSCOPY WITH MYOSURE N/A 02/02/2021   Procedure: DILATATION & CURETTAGE/HYSTEROSCOPY WITH MYOSURE POLYPECTOMY;  Surgeon: Alger Gong, MD;  Location: [facility];  Service: Gynecology;  Laterality: N/A;   ESOPHAGOGASTRODUODENOSCOPY (EGD) WITH PROPOFOL   last one 12-30-2019  dr legrand   EYE SURGERY  10/21/2011   bil. for tissue growth-laser surgery   HARDWARE REMOVAL Left 03/16/2016   Procedure: LEFT ANKLE HARDWARE REMOVAL;  Surgeon: Kay CHRISTELLA Cummins, MD;  Location: [facility];  Service: Orthopedics;  Laterality: Left;   MM BREAST STEREO BIOPSY LEFT ([facility] HX) Left 2015   ORIF ANKLE FRACTURE Left 11/13/2015   Procedure: OPEN REDUCTION INTERNAL FIXATION (ORIF) LEFT BIMALLEOLAR ANKLE FRACTURE;  Surgeon: Kay CHRISTELLA Cummins, MD;  Location: [facility];  Service: Orthopedics;  Laterality: Left;    Family History  Problem Relation Age of Onset   Hypertension Mother    Cancer Mother    Hypertension Father    Arthritis Brother  Hypertension Brother    Intellectual disability Paternal Aunt    Colon cancer Neg Hx     Social History   Socioeconomic History   Marital status: Single    Spouse name: Not on file   Number of children: 2   Years of education: Not on file   Highest education level: 4th grade  Occupational History   Occupation: unemployed  Tobacco Use   Smoking status: Former    Current  packs/day: 0.00    Average packs/day: 0.3 packs/day for 10.0 years (2.5 ttl pk-yrs)    Types: Cigarettes    Start date: 81    Quit date: 2008    Years since quitting: 17.0   Smokeless tobacco: Never  Vaping Use   Vaping status: Never Used  Substance and Sexual Activity   Alcohol use: No   Drug use: Never   Sexual activity: Not Currently    Birth control/protection: Post-menopausal  Other Topics Concern   Not on file  Social History Narrative   Not on file   Social Drivers of Health   Financial Resource Strain: Not on file  Food Insecurity: No Food Insecurity (05/26/2022)   Hunger Vital Sign    Worried About Running Out of Food in the Last Year: Never true    Ran Out of Food in the Last Year: Never true  Transportation Needs: Unmet Transportation Needs (05/26/2022)   PRAPARE - Transportation    Lack of Transportation (Medical): Yes    Lack of Transportation (Non-Medical): Yes  Physical Activity: Insufficiently Active (09/28/2017)   Exercise Vital Sign    Days of Exercise per Week: 7 days    Minutes of Exercise per Session: 20 min  Stress: No Stress Concern Present (09/28/2017)   [employer] of Occupational Health - Occupational Stress Questionnaire    Feeling of Stress : Only a little  Social Connections: Somewhat Isolated (09/28/2017)   Social Connection and Isolation Panel [NHANES]    Frequency of Communication with Friends and Family: More than three times a week    Frequency of Social Gatherings with Friends and Family: Once a week    Attends Religious Services: More than 4 times per year    Active Member of [employer] or Organizations: No    Attends Banker Meetings: Never    Marital Status: Separated    Allergies  Allergen Reactions   Hydrocodone Nausea And Vomiting   Tramadol  Nausea Only   Oxycodone  Rash    Outpatient Medications Prior to Visit  Medication Sig Dispense Refill   atorvastatin  (LIPITOR) 20 MG tablet Take 1 tablet (20 mg total)  by mouth daily. 90 tablet 1   lisinopril  (ZESTRIL ) 10 MG tablet Take 1 tablet (10 mg total) by mouth daily. 90 tablet 1   gabapentin  (NEURONTIN ) 100 MG capsule TAKE 1 CAPSULE BY MOUTH 2 TIMES DAILY. 180 capsule 1   meloxicam  (MOBIC ) 7.5 MG tablet Take 1 tablet (7.5 mg total) by mouth daily. (Patient not taking: Reported on 11/07/2023) 90 tablet 1   No facility-administered medications prior to visit.     ROS Review of Systems  Constitutional:  Negative for activity change and appetite change.  HENT:  Negative for sinus pressure and sore throat.   Respiratory:  Negative for chest tightness, shortness of breath and wheezing.   Cardiovascular:  Negative for chest pain and palpitations.  Gastrointestinal:  Negative for abdominal distention, abdominal pain and constipation.  Genitourinary:  Positive for flank pain.  Psychiatric/Behavioral:  Negative for behavioral  problems and dysphoric mood.     Objective:  BP (!) 140/79   Pulse 85   Ht 4' 11 (1.499 m)   Wt 170 lb 12.8 oz (77.5 kg)   LMP 10/08/2015 (Exact Date)   SpO2 98%   BMI 34.50 kg/m      11/07/2023   10:52 AM 08/28/2023    7:03 PM 05/02/2023   12:15 PM  BP/Weight  Systolic BP 140 115 142  Diastolic BP 79 78 79  Wt. (Lbs) 170.8    BMI 34.5 kg/m2        Physical Exam Constitutional:      Appearance: She is well-developed.  Cardiovascular:     Rate and Rhythm: Normal rate.     Heart sounds: Normal heart sounds. No murmur heard. Pulmonary:     Effort: Pulmonary effort is normal.     Breath sounds: Normal breath sounds. No wheezing or rales.  Chest:     Chest wall: No tenderness.  Abdominal:     General: Bowel sounds are normal. There is no distension.     Palpations: Abdomen is soft. There is no mass.     Tenderness: There is no abdominal tenderness.  Musculoskeletal:        General: Normal range of motion.     Right lower leg: No edema.     Left lower leg: No edema.  Neurological:     Mental Status: She is  alert and oriented to person, place, and time.  Psychiatric:        Mood and Affect: Mood normal.        Latest Ref Rng & Units 08/28/2023    7:25 PM 01/26/2023    1:43 PM 11/17/2022    2:28 PM  CMP  Glucose 70 - 99 mg/dL 885  851  799   BUN 8 - 23 mg/dL 5  11  9    Creatinine 0.44 - 1.00 mg/dL 9.41  9.27  9.42   Sodium 135 - 145 mmol/L 140  144  138   Potassium 3.5 - 5.1 mmol/L 3.7  4.3  3.7   Chloride 98 - 111 mmol/L 106  104  104   CO2 22 - 32 mmol/L 24  24  25    Calcium  8.9 - 10.3 mg/dL 8.7  9.7  8.9   Total Protein 6.5 - 8.1 g/dL 6.9  7.1    Total Bilirubin 0.3 - 1.2 mg/dL 0.4  0.3    Alkaline Phos 38 - 126 U/L 104  136    AST 15 - 41 U/L 20  28    ALT 0 - 44 U/L 28  53      Lipid Panel     Component Value Date/Time   CHOL 146 01/13/2022 1110   TRIG 127 01/13/2022 1110   HDL 46 01/13/2022 1110   CHOLHDL 3.8 12/13/2017 1150   CHOLHDL 5.3 (H) 11/23/2016 0952   VLDL 61 (H) 11/23/2016 0952   LDLCALC 77 01/13/2022 1110    CBC    Component Value Date/Time   WBC 6.8 08/28/2023 1925   RBC 4.68 08/28/2023 1925   HGB 14.2 08/28/2023 1925   HGB 13.8 03/22/2023 1704   HGB 14.9 10/08/2013 0853   HCT 42.8 08/28/2023 1925   HCT 41.5 03/22/2023 1704   HCT 43.2 10/08/2013 0853   PLT 264 08/28/2023 1925   PLT 281 03/22/2023 1704   MCV 91.5 08/28/2023 1925   MCV 91 03/22/2023 1704   MCV 92.8 10/08/2013 0853  MCH 30.3 08/28/2023 1925   MCHC 33.2 08/28/2023 1925   RDW 12.0 08/28/2023 1925   RDW 14.4 03/22/2023 1704   RDW 12.8 10/08/2013 0853   LYMPHSABS 1.0 03/22/2023 1704   LYMPHSABS 2.1 10/08/2013 0853   MONOABS 0.4 03/07/2018 1046   MONOABS 0.4 10/08/2013 0853   EOSABS 0.0 03/22/2023 1704   BASOSABS 0.0 03/22/2023 1704   BASOSABS 0.1 10/08/2013 9146    Lab Results  Component Value Date   HGBA1C 5.8 (H) 01/26/2023    Assessment & Plan:      Right Flank Pain/  right renal cyst History of right renal cyst noted in 2022. Urinalysis shows slight hematuria, no  UTI. -Order MRI to evaluate right renal cyst given persistent pain and presence of hematuria.  Hypertension Blood pressure slightly elevated at today's visit. Patient reports taking antihypertensive medication at night. -Continue current antihypertensive regimen.  Hyperlipidemia Patient due for cholesterol check, but not fasting at today's visit. -Schedule fasting lipid panel for next Monday.  Diabetes -Last A1c of 5.8 -Will check again today.   Colon Cancer Screening Due for annual fecal occult blood test. -Provide fecal occult blood test kit for patient to complete at home.  Influenza Vaccination Patient due for annual flu shot. -Administer flu shot today.          Meds ordered this encounter  Medications   atorvastatin  (LIPITOR) 20 MG tablet    Sig: Take 1 tablet (20 mg total) by mouth daily.    Dispense:  90 tablet    Refill:  1   lisinopril  (ZESTRIL ) 10 MG tablet    Sig: Take 1 tablet (10 mg total) by mouth daily.    Dispense:  90 tablet    Refill:  1    Follow-up: Return in about 6 months (around 05/06/2024) for Chronic medical conditions.       Corrina Sabin, MD, FAAFP. [facility] [place], [place] [phone]   11/07/2023, 12:18 PM

## 2023-11-07 NOTE — Patient Instructions (Signed)
 Dolor en la dolor en la fosa lumbar en adultos Flank Pain, Adult El dolor en la fosa lumbar es aquel dolor que se siente en un lado del cuerpo. El flanco es la zona que se localiza en un lado del cuerpo, entre la parte superior del vientre (abdomen) y Landscape architect. El Software engineer en un perodo corto de [place] o puede durar mucho tiempo o reaparecer con frecuencia (crnico). Puede ser leve o muy intenso. El dolor en esta zona puede tener diferentes causas. Siga estas instrucciones en su casa:  Beba suficiente lquido para mantener el pis (la orina) de color amarillo plido. Haga reposo como se lo haya indicado el mdico. Use los medicamentos de venta libre y los recetados solamente como se lo haya indicado el mdico. Realice un seguimiento por escrito de lo siguiente: Qu le caus dolor en la fosa lumbar. Qu ha hecho que Chief Technology Officer en el flanco mejore. Concurra a todas las visitas de seguimiento. Comunquese con un mdico si: Los medicamentos no Tourist information centre manager. Aparecen nuevos sntomas. El dolor [place]. Los sntomas duran ms de 2 a [address]. Tiene dificultad para orinar. Orina con ms frecuencia que lo normal. Solicite ayuda de inmediato si: Tiene dificultad para respirar. Le falta el aire. Le duele el vientre, o este est hinchado o enrojecido. Tiene ganas de vomitar (nuseas). Vomita. Siente que podra desmayarse o se desmaya. Observa sangre en la orina. Tiene dolor en la fosa lumbar y fiebre. Estos sntomas pueden Customer service manager. Solicite ayuda de inmediato. Comunquese con el servicio de emergencias de su localidad (911 en los [place]). No espere a ver si los sntomas desaparecen. No conduzca por sus propios medios [employer]. Resumen El dolor en la fosa lumbar es aquel dolor que se siente en un lado del cuerpo. El flanco es la zona que se localiza en un lado del cuerpo, entre la parte superior del vientre (abdomen) y Landscape architect. El dolor  en la fosa lumbar puede aparecer en un perodo corto de tiempo (agudo) o puede durar mucho tiempo o reaparecer con frecuencia (crnico). Puede ser leve o muy intenso. El dolor en esta zona puede tener diferentes causas. Comunquese con su mdico si los sntomas empeoran o si duran ms de 2 a 3 das. Esta informacin no tiene Theme park manager el consejo del mdico. Asegrese de hacerle al mdico cualquier pregunta que tenga. Document Revised: 01/19/2021 Document Reviewed: 01/19/2021 Elsevier Patient Education  2024 [employer].

## 2023-11-08 ENCOUNTER — Other Ambulatory Visit: Payer: Self-pay

## 2023-11-08 MED ORDER — GABAPENTIN 100 MG PO CAPS
100.0000 mg | ORAL_CAPSULE | Freq: Two times a day (BID) | ORAL | 1 refills | Status: DC
  Filled 2023-11-08: qty 180, 90d supply, fill #0

## 2023-11-08 MED ORDER — OMEPRAZOLE 40 MG PO CPDR
40.0000 mg | DELAYED_RELEASE_CAPSULE | Freq: Every day | ORAL | 1 refills | Status: DC
  Filled 2023-11-08 – 2023-11-09 (×2): qty 90, 90d supply, fill #0
  Filled 2024-05-06: qty 90, 90d supply, fill #1

## 2023-11-09 ENCOUNTER — Other Ambulatory Visit: Payer: Self-pay

## 2023-11-13 ENCOUNTER — Other Ambulatory Visit: Payer: Self-pay

## 2023-11-13 ENCOUNTER — Ambulatory Visit: Payer: Self-pay | Attending: Family Medicine

## 2023-11-13 DIAGNOSIS — E785 Hyperlipidemia, unspecified: Secondary | ICD-10-CM

## 2023-11-13 DIAGNOSIS — I10 Essential (primary) hypertension: Secondary | ICD-10-CM

## 2023-11-13 DIAGNOSIS — R7303 Prediabetes: Secondary | ICD-10-CM

## 2023-11-14 LAB — LP+NON-HDL CHOLESTEROL
Cholesterol, Total: 229 mg/dL — ABNORMAL HIGH (ref 100–199)
HDL: 46 mg/dL (ref >39)
LDL Chol Calc (NIH): 134 mg/dL — ABNORMAL HIGH (ref 0–99)
Total Non-HDL-Chol (LDL+VLDL): 183 mg/dL — ABNORMAL HIGH (ref 0–129)
Triglycerides: 272 mg/dL — ABNORMAL HIGH (ref 0–149)
VLDL Cholesterol Cal: 49 mg/dL — ABNORMAL HIGH (ref 5–40)

## 2023-11-14 LAB — CMP14+EGFR
ALT: 20 [IU]/L (ref 0–32)
AST: 12 [IU]/L (ref 0–40)
Albumin: 4.1 g/dL (ref 3.9–4.9)
Alkaline Phosphatase: 130 [IU]/L — ABNORMAL HIGH (ref 44–121)
BUN/Creatinine Ratio: 14 (ref 12–28)
BUN: 9 mg/dL (ref 8–27)
Bilirubin Total: 0.4 mg/dL (ref 0.0–1.2)
CO2: 24 mmol/L (ref 20–29)
Calcium: 9.6 mg/dL (ref 8.7–10.3)
Chloride: 105 mmol/L (ref 96–106)
Creatinine, Ser: 0.64 mg/dL (ref 0.57–1.00)
Globulin, Total: 2.8 g/dL (ref 1.5–4.5)
Glucose: 104 mg/dL — ABNORMAL HIGH (ref 70–99)
Potassium: 4.8 mmol/L (ref 3.5–5.2)
Sodium: 143 mmol/L (ref 134–144)
Total Protein: 6.9 g/dL (ref 6.0–8.5)
eGFR: 100 mL/min/{1.73_m2} (ref >59)

## 2023-11-14 LAB — HEMOGLOBIN A1C
Est. average glucose Bld gHb Est-mCnc: 126 mg/dL
Hgb A1c MFr Bld: 6 % — ABNORMAL HIGH (ref 4.8–5.6)

## 2023-11-15 ENCOUNTER — Other Ambulatory Visit: Payer: Self-pay | Admitting: Family Medicine

## 2023-11-15 ENCOUNTER — Other Ambulatory Visit: Payer: Self-pay

## 2023-11-15 DIAGNOSIS — E785 Hyperlipidemia, unspecified: Secondary | ICD-10-CM

## 2023-11-15 MED ORDER — ATORVASTATIN CALCIUM 40 MG PO TABS
40.0000 mg | ORAL_TABLET | Freq: Every day | ORAL | 1 refills | Status: DC
  Filled 2023-11-15 – 2024-05-06 (×2): qty 90, 90d supply, fill #0

## 2023-11-17 ENCOUNTER — Ambulatory Visit (HOSPITAL_COMMUNITY)
Admission: RE | Admit: 2023-11-17 | Discharge: 2023-11-17 | Disposition: A | Discharge status: Home / Self Care | Payer: Self-pay | Source: Ambulatory Visit | Attending: Family Medicine | Admitting: Family Medicine

## 2023-11-17 ENCOUNTER — Ambulatory Visit: Payer: Self-pay | Admitting: *Deleted

## 2023-11-17 DIAGNOSIS — N2889 Other specified disorders of kidney and ureter: Secondary | ICD-10-CM | POA: Insufficient documentation

## 2023-11-17 DIAGNOSIS — N281 Cyst of kidney, acquired: Secondary | ICD-10-CM | POA: Insufficient documentation

## 2023-11-17 MED ORDER — GADOBUTROL 1 MMOL/ML IV SOLN
7.0000 mL | Freq: Once | INTRAVENOUS | Status: AC | PRN
Start: 2023-11-17 — End: 2023-11-17
  Administered 2023-11-17: 7 mL via INTRAVENOUS

## 2023-11-17 NOTE — Telephone Encounter (Signed)
Pt given lab results per interpreter [facility] ID # [phone] per notes of Dr. Alvis Lemmings from 11/15/23 on 11/17/23. Pt verbalized understanding : Please inform her that kidney and liver functions are normal, cholesterol is elevated and I have sent an increased dose of atorvastatin 40 mg to her pharmacy.  She can take 2 of her 20 mg tablets to make a total of 40 mg until she runs out of her medications.  Labs reveal prediabetes with an A1c of 6.9.  Working on a low carbohydrate diet, exercise, weight loss is recommended in order to prevent progression to type 2 diabetes mellitus.Thanks.   Patient also requesting results from CT scan today . No review documentation from PCP at this time. Reviewed with patient once PCP has review results she will be notified. Patient verbalized understanding .

## 2023-11-24 ENCOUNTER — Other Ambulatory Visit: Payer: Self-pay

## 2024-01-10 ENCOUNTER — Telehealth: Payer: Self-pay | Admitting: Family Medicine

## 2024-01-10 ENCOUNTER — Encounter: Payer: Self-pay | Admitting: Neurology

## 2024-01-10 ENCOUNTER — Other Ambulatory Visit: Payer: Self-pay

## 2024-01-10 ENCOUNTER — Ambulatory Visit: Payer: Self-pay | Attending: Physician Assistant | Admitting: Physician Assistant

## 2024-01-10 VITALS — BP 141/84 | HR 79 | Temp 98.0°F | Ht 59.0 in | Wt 172.2 lb

## 2024-01-10 DIAGNOSIS — G500 Trigeminal neuralgia: Secondary | ICD-10-CM

## 2024-01-10 DIAGNOSIS — H6991 Unspecified Eustachian tube disorder, right ear: Secondary | ICD-10-CM

## 2024-01-10 DIAGNOSIS — Z603 Acculturation difficulty: Secondary | ICD-10-CM

## 2024-01-10 DIAGNOSIS — Z758 Other problems related to medical facilities and other health care: Secondary | ICD-10-CM

## 2024-01-10 MED ORDER — FLUTICASONE PROPIONATE 50 MCG/ACT NA SUSP
2.0000 | Freq: Every day | NASAL | 6 refills | Status: AC
  Filled 2024-01-10: qty 16, 30d supply, fill #0

## 2024-01-10 MED ORDER — GABAPENTIN 300 MG PO CAPS
300.0000 mg | ORAL_CAPSULE | Freq: Three times a day (TID) | ORAL | 3 refills | Status: AC
  Filled 2024-01-10: qty 90, 30d supply, fill #0

## 2024-01-10 NOTE — Progress Notes (Signed)
 Patient ID: Susan Lopez, female   DOB: 26-Jun-1961, 63 y.o.   MRN: [id number]     Susan Lopez, is a 63 y.o. female  MVH:[id number]  [id number]:[id number]  DOB - 02-18-1961  No chief complaint on file.      Subjective:   Susan Lopez is a 63 y.o. female here today for Pain in R side of head and face for 2  to 5 months with R side of face is twitching and seems to be worsening.  She was seen at [facility] for this in October.  The s/sx have waxed and waned since then.  She c/o R ear and lower face and headache pain.  Sometimes increase in lacrimation of R eye.  Tylenol with minimal help.  No fevers.  No injury.  No vision changes.  No extremity weakness.  No changes in appetite or swallowing.  Seems to have worsened over the last month.    Susan Lopez #[id number]  ALLERGIES: Allergies  Allergen Reactions   Hydrocodone Nausea And Vomiting   Tramadol Nausea Only   Oxycodone Rash    PAST MEDICAL HISTORY: Past Medical History:  Diagnosis Date   Anxiety    Arthritis    Chronic gastritis    Chronic thoracic spine pain    Depression    GERD (gastroesophageal reflux disease)    Hemorrhoids, external 10-21-11   occ. bothersome   History of colon polyps    Hyperlipidemia    Hypertension    Iron deficiency anemia    OSA on CPAP    study in epic 08-29-2017 severe osa (AHI 32.5), per pt daughter uses nightly   PMB (postmenopausal bleeding)    PONV (postoperative nausea and vomiting)    Pre-diabetes     MEDICATIONS AT HOME: Prior to Admission medications   Medication Sig Start Date End Date Taking? Authorizing Provider  fluticasone (FLONASE) 50 MCG/ACT nasal spray Place 2 sprays into both nostrils daily. 01/10/24  Yes Anders Simmonds, PA-C  gabapentin (NEURONTIN) 300 MG capsule Take 1 capsule (300 mg total) by mouth 3 (three) times daily. 01/10/24  Yes Georgian Co M, PA-C  atorvastatin (LIPITOR) 40 MG tablet Take 1 tablet (40 mg total) by mouth daily. 11/15/23   Hoy Register, MD  lisinopril (ZESTRIL) 10 MG tablet Take 1 tablet (10 mg total) by mouth daily. 11/07/23   Hoy Register, MD  meloxicam (MOBIC) 7.5 MG tablet Take 1 tablet (7.5 mg total) by mouth daily. Patient not taking: Reported on 11/07/2023 05/02/23   Hoy Register, MD  omeprazole (PRILOSEC) 40 MG capsule TOME 1 PASTILLA POR VIA ORAL DOS VECES AL DIA A REDUCIR EL ACIDO DEL ESTOMAGO 11/08/23   Newlin, Enobong, MD    ROS: Neg resp Neg cardiac Neg GI Neg GU Neg MS Neg psych  Objective:   Vitals:   01/10/24 0900  BP: (!) 141/84  Pulse: 79  Temp: 98 F (36.7 C)  SpO2: 98%  Weight: 172 lb 3.2 oz (78.1 kg)  Height: 4\' 11"  (1.499 m)   Exam General appearance : Awake, alert, not in any distress. Speech Clear. Not toxic looking HEENT: Atraumatic and Normocephalic, pupils equally reactive to light and accomodation, EOM intact.  There is a visual muscle spasm/nerve twitching in the R lower face.  Mouth-WNL throat with PND.  R TM bulging without infection.   Neck: Supple, no JVD. No cervical lymphadenopathy.  Chest: Good air entry bilaterally, CTAB.  No rales/rhonchi/wheezing CVS: S1 S2 regular, no murmurs.  Extremities: B/L  Lower Ext shows no edema, both legs are warm to touch Neurology: Awake alert, and oriented X 3 face with visible contractions on the R side of lower face.  U&L extremites full ROM.  No other asymmetry.  No palsy Skin: No Rash  Data Review Lab Results  Component Value Date   HGBA1C 6.0 (H) 11/13/2023   HGBA1C 5.8 (H) 01/26/2023   HGBA1C 6.0 (H) 01/13/2022    Assessment & Plan   1. Trigeminal neuralgia of right side of face (Primary)(suspected) HA warnings to ED.  Patient verbalized understanding- CT HEAD WO CONTRAST ( ); Future - Ambulatory referral to Neurology - gabapentin (NEURONTIN) 300 MG capsule; Take 1 capsule (300 mg total) by mouth 3 (three) times daily.  Dispense: 90 capsule; Refill: 3 Continue tylenol  2. Language barrier AMN interpreters Efrain Sella  [phone] used and additional time performing visit was required.   3. Dysfunction of right eustachian tube Related to #1 - fluticasone (FLONASE) 50 MCG/ACT nasal spray; Place 2 sprays into both nostrils daily.  Dispense: 16 g; Refill: 6    Return for July apt with Dr Alvis Lemmings.  The patient was given clear instructions to go to ER or return to medical center if symptoms don't improve, worsen or new problems develop. The patient verbalized understanding. The patient was told to call to get lab results if they haven't heard anything in the next week.      Georgian Co, PA-C [facility] [place], [place] [phone]   01/10/2024, 9:50 AM

## 2024-01-10 NOTE — Patient Instructions (Addendum)
 Neuralgia del trigmino Trigeminal Neuralgia  La neuralgia del trigmino es un trastorno nervioso que causa dolor intenso en un lado de la cara. El dolor puede durar entre unos segundos y varios minutos, pero puede ocurrir cientos de Immunologist. Habitualmente, el dolor aparece en un lado de la cara. Los sntomas pueden presentarse [address], semanas o meses, y Clinical biochemist por meses o aos. El dolor puede volver y ser peor que antes. Cules son las causas? Esta afeccin puede ser causada por lo siguiente: Gardiner Ramus o presin en un nervio de la cabeza que se llama nervio trigmino. Las siguientes acciones pueden desencadenar una crisis: Systems developer. Maquillarse. Lavarse, afeitarse o tocarse la cara. Cepillarse los dientes. Las rfagas de aire fro o caliente. Trastornos desmielinizantes primarios, como la esclerosis mltiple. Tumores. Qu incrementa el riesgo? Es ms probable que sufra esta afeccin si: Tiene entre 50 y 51 aos de edad. Es mujer. Cules son los signos o sntomas? El sntoma principal de esta afeccin es un dolor intenso en la [place], los labios, los ojos, la [place], el cuero cabelludo, la frente y Recruitment consultant. Cmo se diagnostica? Esta afeccin se diagnostica mediante un examen fsico. Se puede realizar una exploracin por tomografa computarizada (TC) o una resonancia magntica (RM) para descartar la presencia de otros trastornos que pueden causar dolor facial. Cmo se trata? El tratamiento de esta afeccin puede incluir: Medidas para evitar las cosas que desencadenan los sntomas. Medicamentos recetados, [employer]. Procedimientos como ablacin, tratamiento trmico o radioterapia. Terapia cognitiva o conductual. Tratamientos complementarios, tales como: Ejercicio suave y regular o yoga. Meditacin. Aromaterapia. Acupuntura. Ciruga. Esta puede realizarse [employer] casos son graves, si otro tratamiento mdico no brinda alivio. El  tratamiento puede demorar hasta un mes en comenzar a Engineer, materials. Siga estas indicaciones en su casa: Control del dolor  Infrmese lo ms que pueda sobre cmo Runner, broadcasting/film/video. Pregntele al mdico si sera til recurrir a Dance movement psychotherapist. Considere hablar con un mdico especializado en salud mental sobre cmo lidiar con Chief Technology Officer. Considere la posibilidad de Advertising account planner en un grupo de apoyo para Chief Technology Officer. Indicaciones generales Use los medicamentos de venta libre y los recetados solamente como se lo haya indicado el mdico. Evite las cosas que desencadenan los sntomas. Puede ser de ayuda: Masticar del lado de la boca que no est afectado. Evitar tocarse el rostro. Evitar las rfagas de aire fro o caliente. Concurra a todas las visitas de seguimiento. Dnde buscar ms informacin Facial Pain Association (Asociacin del dolor facial): facepain.org Comunquese con un mdico si: Los medicamentos que toma no [employer]. Los medicamentos que toma para el tratamiento le causan efectos secundarios. Le aparecen sntomas nuevos que no se pueden explicar, por ejemplo: Visin doble. Debilidad o adormecimiento facial. Cambios en la audicin o el equilibrio. Se siente deprimida. Solicite ayuda de inmediato si: El dolor es intenso y no mejora. Tiene pensamientos suicidas. Si alguna vez siente que puede lastimarse o Physicist, medical a Economist, o tiene pensamientos de poner fin a su vida, busque ayuda de inmediato. Dirjase al servicio de urgencias ms cercano o: Comunquese con el servicio de emergencias de su localidad (911 en los [place]). Llame a una lnea de [place] para crisis suicidas, como la National Suicide Prevention Lifeline (Lnea Telefnica Nacional para la Prevencin del Suicidio) al (701)[phone] o al 988 en los [place]. Esta lnea atiende las 24 horas del da en los [place]. Enve un mensaje de  texto a la lnea para casos de crisis al [phone] (en los EE.  UU.). Resumen La neuralgia del trigmino es un trastorno nervioso que causa dolor intenso en un lado de la cara. El dolor puede durar de unos segundos a varios minutos. La causa de esta afeccin es el dao o la presin en un nervio de la cabeza que se llama trigmino. El tratamiento puede incluir evitar las cosas que desencadenan los sntomas, tomar medicamentos, o someterse a [place] o procedimientos. El tratamiento puede demorar hasta un mes en comenzar a Engineer, materials. Concurra a todas las visitas de seguimiento. Esta informacin no tiene Theme park manager el consejo del mdico. Asegrese de hacerle al mdico cualquier pregunta que tenga. Document Revised: 05/06/2021 Document Reviewed: 05/06/2021 Elsevier Patient Education  2024 Elsevier Inc.  Disfuncin de la trompa de Eustaquio Eustachian Tube Dysfunction  Disfuncin de la trompa de Eustaquio hace referencia a una afeccin en la que se forma una obstruccin en el pasaje estrecho que conecta el odo medio con la parte posterior de la nariz (trompa de [place]). La trompa de [employer] regula la presin del aire en el odo medio al permitir que el aire se mueva entre el odo y la Darene Lamer. Tambin ayuda a Forensic psychologist lquido del espacio del odo [place]. La disfuncin de la trompa de Eustaquio puede afectar a uno o ambos odos. Cuando la trompa de Eustaquio no funciona correctamente, la presin de aire, de lquido, o ambas pueden acumularse en el odo medio. Cules son las causas? Esta afeccin se produce cuando la trompa de Eustaquio se obstruye o no puede abrirse normalmente. Las causas ms frecuentes de esta afeccin incluyen las siguientes: Infecciones en los odos. Resfriados y otras infecciones que afectan la nariz, la boca y la garganta (vas respiratorias superiores). Alergias. Irritacin por el humo del cigarrillo. Irritacin por el retroceso de cido estomacal hacia el esfago (reflujo gastroesofgico). El esfago es el rgano del  cuerpo que transporta los alimentos desde la boca al [place]. Cambios sbitos en la presin del aire, como cuando baja el avin o cuando se practica buceo. Crecimientos anormales en la nariz o la garganta, por ejemplo: Crecimientos en el interior de la nariz (plipos nasales). Crecimiento anormal de clulas (tumores). Agrandamiento de tejido en la parte posterior de la garganta (adenoides). Qu incrementa el riesgo? Es ms probable que desarrolle esta afeccin si: Fuma. Tiene sobrepeso. Es un nio que tiene: Ciertos defectos congnitos de la boca, como fisura Therapist, music. Amgdalas o adenoides grandes. Cules son los signos o sntomas? Los sntomas frecuentes de esta afeccin incluyen los siguientes: Sensacin de que el odo est lleno. Dolor de odo. Ruidos de chasquidos o estallidos en el odo. Zumbido en el odo (acfenos). Prdida auditiva. Prdida del equilibrio. Mareos. Los sntomas pueden empeorar cuando la presin del aire que lo rodea cambia, como cuando viaja a una zona de gran elevacin, vuela en avin o practica buceo. Cmo se diagnostica? Esta afeccin se puede diagnosticar en funcin de lo siguiente: Sus sntomas. Un examen fsico de los odos, la nariz y Administrator. Pruebas, como por ejemplo aquellos que miden: El movimiento del tmpano. Su audicin Allie Bossier). Cmo se trata? El tratamiento depende de la causa y de la gravedad de Astronomer. En los casos leves, es posible [employer] sntomas con el movimiento de aire [place] odos. Esto se denomina "destaparse los odos". En los casos ms graves, o si tiene sntomas de lquido Teachers Insurance and An[employer]ity Association odos, el tratamiento puede incluir: Medicamentos para  aliviar la congestin (descongestivos). Medicamentos para tratar alergias (antihistamnicos). Aerosoles nasales o gotas ticas que contengan medicamentos que reduzcan la inflamacin (corticoesteroides). Un procedimiento para drenar el lquido del tmpano. En este  procedimiento, se coloca un tubo pequeo en el tmpano para: Drenar el lquido. Restablecer el aire en el espacio del odo medio. Un procedimiento para insertar un dispositivo de baln a travs de la nariz para inflar la abertura de la trompa de Eustaquio (dilatacin con baln). Siga estas instrucciones en su casa: Estilo de vida No haga ninguna de estas cosas hasta que el mdico lo autorice: Viajar a grandes alturas. Viajar en avin. Devona Konig en Neomia Dear cabina o una habitacin presurizada. Practicar buceo. No consuma ningn producto que contenga nicotina o tabaco. Estos productos incluyen cigarrillos, tabaco para Theatre manager y aparatos de vapeo, como los Administrator, Civil Service. Si necesita ayuda para dejar de fumar, consulte al mdico. Mantenga los odos secos. Use tapones para los odos que le calcen bien al ducharse y baarse. Despus squese bien los odos. Instrucciones generales Use los medicamentos de venta libre y los recetados solamente como se lo haya indicado el mdico. Use las tcnicas que le haya recomendado el mdico para ayudar a destaparse los odos. Pueden incluir: Engineer, mining. Bostezos. Tragar vigorosamente con frecuencia. Cerrar la boca, taparse la nariz y soplar suavemente como si estuviera tratando de soplar aire por la Darene Lamer. Concurra a todas las visitas de seguimiento. Esto es importante. Comunquese con un mdico si: Sus sntomas no desaparecen despus del tratamiento. Los sntomas regresan despus del tratamiento. No logra destaparse los odos. Tiene los siguientes sntomas: Grant Ruts. Dolor en el odo. Dolor en la cabeza o el cuello. Lquido que le supura del odo. La audicin le cambia de repente. Se siente muy mareado. Pierde el equilibrio. Solicite ayuda de inmediato si: Tiene un aumento repentino y grave de cualquiera de los sntomas. Resumen Disfuncin de la trompa de Eustaquio hace referencia a una afeccin en la que se forma una obstruccin en la trompa  de [place]. Puede ser consecuencia de infecciones del odo, alergias, inhalacin de sustancias irritantes o crecimientos anormales en la nariz o la garganta. Los sntomas incluyen dolor de odo o sensacin de que el odo est lleno, prdida de la audicin o zumbidos Teachers Insurance and Annuity Association odos. Los casos leves se tratan con tcnicas para desbloquear las orejas, como bostezar o Education administrator. Los casos ms graves se tratan con medicamentos o procedimientos. Esta informacin no tiene Theme park manager el consejo del mdico. Asegrese de hacerle al mdico cualquier pregunta que tenga. Document Revised: 01/19/2021 Document Reviewed: 01/19/2021 Elsevier Patient Education  2024 [employer].

## 2024-01-10 NOTE — Telephone Encounter (Signed)
 Pt is requesting cologuard order. Please advise

## 2024-01-10 NOTE — Telephone Encounter (Signed)
 error

## 2024-01-11 ENCOUNTER — Ambulatory Visit
Admission: RE | Admit: 2024-01-11 | Discharge: 2024-01-11 | Discharge status: Home / Self Care | Payer: Self-pay | Source: Ambulatory Visit | Attending: Physician Assistant | Admitting: Physician Assistant

## 2024-01-11 DIAGNOSIS — G500 Trigeminal neuralgia: Secondary | ICD-10-CM

## 2024-01-11 NOTE — Telephone Encounter (Signed)
 Patient does not have insurance fecal kit order was placed at last office visit.

## 2024-02-06 ENCOUNTER — Encounter: Payer: Self-pay | Admitting: Neurology

## 2024-02-06 ENCOUNTER — Ambulatory Visit (INDEPENDENT_AMBULATORY_CARE_PROVIDER_SITE_OTHER): Payer: Self-pay | Admitting: Neurology

## 2024-02-06 VITALS — BP 142/73 | HR 77 | Ht 59.0 in | Wt 168.0 lb

## 2024-02-06 DIAGNOSIS — G5131 Clonic hemifacial spasm, right: Secondary | ICD-10-CM

## 2024-02-06 DIAGNOSIS — R519 Headache, unspecified: Secondary | ICD-10-CM

## 2024-02-06 NOTE — Progress Notes (Signed)
 [facility] Neurology Division Clinic Note - Initial Visit   Date: 02/06/2024   Susan Lopez MRN: [id number] DOB: 08/26/1961   Dear Dr. Alvis Lemmings:  Thank you for your kind referral of Susan Lopez for consultation of right facial pain and twitching. Although her history is well known to you, please allow [name] to reiterate it for the purpose of our medical record. The patient was accompanied to the clinic by Spanish interpretor who also provides collateral information.     Susan Lopez is a 63 y.o. right-handed female with hyperlipidemia, hypertension, and GERD presenting for evaluation of right facial pain and twitching.   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  Pain is not typical for trigeminal neuralgia and maybe more muscular as it tends to occur when her face has been twitching more.    - MRI brain wwo contrast  - Refer to Dr. Arbutus Leas for consideration of Botox   ------------------------------------------------------------- History of present illness: Starting around January 2025,  she began having right facial twitching involving the eye and side of the mouth.  She reports having pulling sensation at the corner of her mouth and cheek.  Her eyes will also involuntarily close on the right side. It occurs multiple times per day, including at night time.  After the twitching subsides, she has pressure-like pai on the right side of the face.  Her PCP started gabapentin 300mg  TID which makes her sleepy.  It helps with the pain, but does not relieve any facial twitching.    Out-side paper records, electronic medical record, and images have been reviewed where available and summarized as:  CT head wo contrast 01/26/2024: No evidence of acute intracranial abnormality.  Lab Results  Component Value Date   HGBA1C 6.0 (H) 11/13/2023   Lab Results  Component Value Date   VITAMINB12 450 08/28/2023   Lab Results  Component Value  Date   TSH 1.350 08/28/2023   Lab Results  Component Value Date   ESRSEDRATE 1 07/13/2010    Past Medical History:  Diagnosis Date   Anxiety    Arthritis    Chronic gastritis    Chronic thoracic spine pain    Depression    GERD (gastroesophageal reflux disease)    Hemorrhoids, external 10-21-11   occ. bothersome   History of colon polyps    Hyperlipidemia    Hypertension    Iron deficiency anemia    OSA on CPAP    study in epic 08-29-2017 severe osa (AHI 32.5), per pt daughter uses nightly   PMB (postmenopausal bleeding)    PONV (postoperative nausea and vomiting)    Pre-diabetes     Past Surgical History:  Procedure Laterality Date   BREAST BIOPSY Left    CESAREAN SECTION  1985, 1992   CHOLECYSTECTOMY  10/27/2011   Procedure: LAPAROSCOPIC CHOLECYSTECTOMY WITH INTRAOPERATIVE CHOLANGIOGRAM;  Surgeon: Ardeth Sportsman, MD;  Location: [facility];  Service: General;  Laterality: N/A;  Laparoscopic Chole w/ IOC Single Site   COLONOSCOPY WITH PROPOFOL  last one 05-09-2018   DILATATION & CURETTAGE/HYSTEROSCOPY WITH MYOSURE N/A 03/11/2020   Procedure: DILATATION AND CURETTAGE /HYSTEROSCOPY WITH MYOSURE;  Surgeon: Catalina Antigua, MD;  Location: [facility];  Service: Gynecology;  Laterality: N/A;   DILATATION & CURETTAGE/HYSTEROSCOPY WITH MYOSURE N/A 02/02/2021   Procedure: DILATATION & CURETTAGE/HYSTEROSCOPY WITH MYOSURE POLYPECTOMY;  Surgeon: Catalina Antigua, MD;  Location: [facility];  Service: Gynecology;  Laterality: N/A;   ESOPHAGOGASTRODUODENOSCOPY (EGD) WITH PROPOFOL  last one 12-30-2019  dr Myrtie Neither   EYE SURGERY  10/21/2011   bil. for tissue growth-laser surgery   HARDWARE REMOVAL Left 03/16/2016   Procedure: LEFT ANKLE HARDWARE REMOVAL;  Surgeon: Tarry Kos, MD;  Location: [facility];  Service: Orthopedics;  Laterality: Left;   MM BREAST STEREO BIOPSY LEFT ([facility] HX) Left 2015   ORIF ANKLE FRACTURE Left 11/13/2015   Procedure:  OPEN REDUCTION INTERNAL FIXATION (ORIF) LEFT BIMALLEOLAR ANKLE FRACTURE;  Surgeon: Tarry Kos, MD;  Location: [facility];  Service: Orthopedics;  Laterality: Left;     Medications:  Outpatient Encounter Medications as of 02/06/2024  Medication Sig   atorvastatin (LIPITOR) 40 MG tablet Take 1 tablet (40 mg total) by mouth daily.   fluticasone (FLONASE) 50 MCG/ACT nasal spray Place 2 sprays into both nostrils daily.   gabapentin (NEURONTIN) 300 MG capsule Take 1 capsule (300 mg total) by mouth 3 (three) times daily.   lisinopril (ZESTRIL) 10 MG tablet Take 1 tablet (10 mg total) by mouth daily.   omeprazole (PRILOSEC) 40 MG capsule TOME 1 PASTILLA POR VIA ORAL DOS VECES AL DIA A REDUCIR EL ACIDO DEL ESTOMAGO   meloxicam (MOBIC) 7.5 MG tablet Take 1 tablet (7.5 mg total) by mouth daily. (Patient not taking: Reported on 02/06/2024)   No facility-administered encounter medications on file as of 02/06/2024.    Allergies:  Allergies  Allergen Reactions   Hydrocodone Nausea And Vomiting   Tramadol Nausea Only   Oxycodone Rash    Family History: Family History  Problem Relation Age of Onset   Hypertension Mother    Cancer Mother    Hypertension Father    Arthritis Brother    Hypertension Brother    Intellectual disability Paternal Aunt    Colon cancer Neg Hx     Social History: Social History   Tobacco Use   Smoking status: Former    Current packs/day: 0.00    Average packs/day: 0.3 packs/day for 10.0 years (2.5 ttl pk-yrs)    Types: Cigarettes    Start date: 60    Quit date: 2008    Years since quitting: 17.2   Smokeless tobacco: Never  Vaping Use   Vaping status: Never Used  Substance Use Topics   Alcohol use: No   Drug use: Never   Social History   Social History Narrative   Are you right handed or left handed? Right Handed    Are you currently employed ? No    What is your current occupation? none   Do you live at home alone? Yes    Who lives with you?    What type  of home do you live in: 1 story or 2 story? Lives in a one story home        Vital Signs:  BP (!) 142/73   Pulse 77   Ht 4\' 11"  (1.499 m)   Wt 168 lb (76.2 kg)   LMP 10/08/2015 (Exact Date)   SpO2 95%   BMI 33.93 kg/m   Neurological Exam: MENTAL STATUS including orientation to time, place, person, recent and remote memory, attention span and concentration, language, and fund of knowledge is normal.  Speech is not dysarthric.  CRANIAL NERVES: II:  No visual field defects.     III-IV-VI: Pupils equal round and reactive to light.  Normal conjugate, extra-ocular eye movements in all directions of gaze.  No nystagmus.  No ptosis.   V:  Normal facial sensation.  VII:  There is involuntary and repetitive spasm of the right side of the face, involving the eyelid, cheek, and corner of the mouth which was triggered by facial muscle testing.  No facial spasm is present on the left side.  VIII:  Normal hearing and vestibular function.   IX-X:  Normal palatal movement.   XI:  Normal shoulder shrug and head rotation.   XII:  Normal tongue strength and range of motion, no deviation or fasciculation.  MOTOR:  Motor strength is 5/5 throughout.  No atrophy, fasciculations or abnormal movements.  No pronator drift.   MSRs:                                           Right        Left brachioradialis 2+  2+  biceps 2+  2+  triceps 2+  2+  patellar 3+  3+  ankle jerk 2+  2+  Hoffman no  no  plantar response down  down   SENSORY:  Normal and symmetric perception of light touch, temperature and vibration.  COORDINATION/GAIT: Normal finger-to- nose-finger..  Intact rapid alternating movements bilaterally.  Gait narrow based and stable.    Thank you for allowing me to participate in patient's care.  If I can answer any additional questions, I would be pleased to do so.    Sincerely,    Bill Yohn K. Allena Katz, DO

## 2024-02-06 NOTE — Patient Instructions (Signed)
 MRI brain will be ordered.  [facility] will contact you to schedule this.  We will refer you to see Dr. Arbutus Leas for evaluation of facial spasms

## 2024-02-20 NOTE — Progress Notes (Unsigned)
 Assessment/Plan:   1.  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  Botox is certainly the best treatment for this.  We talked about the risks and benefits of Botox, including the black box warning.  We talked about potential for Botox to cause facial asymmetry, but also talked about the fact that chronic hemifacial spasm alone can cause facial asymmetry.    - She is pending MRI brain and I am going to add an MRA brain as well.  Once those are completed, we will go ahead and schedule the Botox. Subjective:   Susan Lopez was seen today in neurologic consultation at the request of Dr. Lydia Sams.  Dr. Lydia Sams saw the patient recently and I had the opportunity to talk with her about the patient.  I have also reviewed records.  Patient is sent here for hemifacial spasm.  Patient has had symptoms since October 2024.  She describes right facial twitching and pulling and closure of the eye on the right.  It came on slowly but its gotten worse with time.  She has tried gabapentin  without relief of the symptoms but it it caused sleepiness. If the twitch is really strong, she feels some on the left side of the face.  She doesn't drive so doesn't affect ability to drive.  It affects ability to eat/talk and pronounce words right.  When the face twitches, she notes some ear pain.  She will use "lubricant" for the ear but thinks that caused the face twitching more.  It did help ear pain.   Dr. Lydia Sams ordered an MRI brain which has not yet been completed.    ALLERGIES:   Allergies  Allergen Reactions   Hydrocodone Nausea And Vomiting   Tramadol  Nausea Only   Oxycodone  Rash    CURRENT MEDICATIONS:  Outpatient Encounter Medications as of 02/22/2024  Medication Sig   atorvastatin  (LIPITOR) 40 MG tablet Take 1 tablet (40 mg total) by mouth daily.   fluticasone  (FLONASE ) 50 MCG/ACT nasal spray Place 2 sprays into both  nostrils daily.   gabapentin  (NEURONTIN ) 300 MG capsule Take 1 capsule (300 mg total) by mouth 3 (three) times daily.   lisinopril  (ZESTRIL ) 10 MG tablet Take 1 tablet (10 mg total) by mouth daily.   meloxicam  (MOBIC ) 7.5 MG tablet Take 1 tablet (7.5 mg total) by mouth daily.   omeprazole  (PRILOSEC) 40 MG capsule TOME 1 PASTILLA POR VIA ORAL DOS VECES AL DIA A REDUCIR EL ACIDO DEL ESTOMAGO   No facility-administered encounter medications on file as of 02/22/2024.    Objective:   PHYSICAL EXAMINATION:    VITALS:   Vitals:   02/22/24 1026  BP: (!) 143/81  Pulse: 71  SpO2: 97%  Weight: 168 lb (76.2 kg)  Height: 4\' 11"  (1.499 m)    GEN:  Normal appears female in no acute distress.  Appears stated age. HEENT:  Normocephalic, atraumatic. The mucous membranes are moist. The superficial temporal arteries are without ropiness or tenderness. Cardiovascular: Regular rate and rhythm. Lungs: Clear to auscultation bilaterally. Neck/Heme: There are no carotid bruits noted bilaterally.  NEUROLOGICAL: Orientation:  The patient is alert and oriented x 3.   Cranial nerves: There is good facial symmetry.  She does have difficulty holding air in the cheek on the right.  She also has mild trouble raising the forehead on the right.  Extraocular muscles are intact and visual fields  are full to confrontational testing.  She has hemifacial spasm around right orbicularis oculi, zygomaticus and just a little bit around orbicularis oris on the right.  She does have some mild blepharospasm.  She does not speak this examiners language to assess fluency/clarity, but translator is present and reports language is good.  Soft palate rises symmetrically and there is no tongue deviation. Hearing is intact to conversational tone. Tone: Tone is good throughout. Sensation: Sensation is intact to light touch and pinprick throughout (facial, trunk, extremities). Vibration is intact at the bilateral big toe. There is no  extinction with double simultaneous stimulation. There is no sensory dermatomal level identified. Coordination:  The patient has no difficulty with RAM's or FNF bilaterally. Motor: Strength is 5/5 in the bilateral upper and lower extremities.  Shoulder shrug is equal and symmetric. There is no pronator drift.  There are no fasciculations noted. DTR's: Deep tendon reflexes are 2/4 at the bilateral biceps, triceps, brachioradialis, 3+patella and 2/4 at the bilateral achilles.  Plantar responses are downgoing bilaterally. Gait and Station: The patient is able to ambulate without difficulty.    Total time spent on today's visit was 42 minutes, including both face-to-face time and nonface-to-face time.  Time included that spent on review of records (prior notes available to me/labs/imaging if pertinent), discussing treatment and goals, answering patient's questions and coordinating care.   Cc:  Newlin, Enobong, MD

## 2024-02-22 ENCOUNTER — Telehealth: Payer: Self-pay

## 2024-02-22 ENCOUNTER — Encounter: Payer: Self-pay | Admitting: Neurology

## 2024-02-22 ENCOUNTER — Other Ambulatory Visit: Payer: Self-pay

## 2024-02-22 ENCOUNTER — Ambulatory Visit (INDEPENDENT_AMBULATORY_CARE_PROVIDER_SITE_OTHER): Payer: Self-pay | Admitting: Neurology

## 2024-02-22 VITALS — BP 143/81 | HR 71 | Ht 59.0 in | Wt 168.0 lb

## 2024-02-22 DIAGNOSIS — G5131 Clonic hemifacial spasm, right: Secondary | ICD-10-CM

## 2024-02-22 DIAGNOSIS — G245 Blepharospasm: Secondary | ICD-10-CM

## 2024-02-22 NOTE — Telephone Encounter (Signed)
 This patient is under [facility] financial assistance. We verified that since it is a medical Botox that she should be covered. I have never done one of these. Dr. Winferd Hatter wants to do Xeomin and 50 units. Do we now need to go through [facility] for a formal approval to cover meds because they will have to be buy and bill

## 2024-02-22 NOTE — Patient Instructions (Signed)
 Call me in 2 weeks if you haven't heard from the MRI.  Once we have that done, we will get the botox scheduled.

## 2024-02-22 NOTE — Telephone Encounter (Signed)
 Susan Lopez at [facility] [phone] in regards to getting patient scheduled for her MRI and MRA. Had to leave a message for a call back.   Called Constantino Demark at his second number [phone] and left a message for a call back.

## 2024-02-23 ENCOUNTER — Telehealth: Payer: Self-pay | Admitting: Neurology

## 2024-02-23 NOTE — Telephone Encounter (Signed)
 Pt's daughter called in returning Mahina's call. She stated it is best to call the patient with an interpreter. [phone]

## 2024-02-23 NOTE — Telephone Encounter (Signed)
 Called and spoke to [facility] Pre-Service Specialist. She informed me that patient has [facility] Financial Assistance until July 2025 and all we need to do now is Holiday representative at [phone] to get patient scheduled.   Called [facility] Scheduling and spoke with Nettie Barb [phone] to schedule MRI Brain W/ W/O and MRA Head WO.  MRI Brain W/ W/O: Scheduled 12:00 pm  MRA Head WO:  Scheduled 11:00 am  Arrival Time of 10:30 am please use entrance A (where valet parking is and check in at front desk)   Called patients daughter Cato Cockayne and left a message for a call back.

## 2024-02-23 NOTE — Telephone Encounter (Signed)
 Called [facility] Financial Assistance [phone] and spoke to [facility] and he informed me that we will need to call the  Pre service center at [phone].

## 2024-02-23 NOTE — Telephone Encounter (Signed)
 Hi there. We aren't familiar with how that process works. Since it is not going through insurance, please contact financial assistance for final verification.

## 2024-02-26 NOTE — Telephone Encounter (Signed)
 Called patient with interpreter Addison [phone]. Had to leave a message for a call back.

## 2024-02-26 NOTE — Telephone Encounter (Signed)
 Called Financial assistance and spoke to [employer]. She informed me we just need to do buy and bill and when entered it should drop in billing work que to be written off. If patient were to receive a bill she needs to call them at financial assistance and they will write it off for her

## 2024-02-28 NOTE — Telephone Encounter (Signed)
 Called patient and interpreter [place] [phone] and was able to get a hold of patient and patient has been informed of upcoming MRI/MRA appointments:   MRI Brain W/ W/O: Scheduled 12:00 pm   MRA Head WO:  Scheduled 11:00 am   Arrival Time of 10:30 am please use entrance A (where valet parking is and check in at front desk)  Patient verbalized understanding and had no further questions or concerns.

## 2024-03-06 ENCOUNTER — Ambulatory Visit (HOSPITAL_COMMUNITY)
Admission: RE | Admit: 2024-03-06 | Discharge: 2024-03-06 | Disposition: A | Discharge status: Home / Self Care | Source: Ambulatory Visit | Attending: Neurology

## 2024-03-06 ENCOUNTER — Ambulatory Visit (HOSPITAL_COMMUNITY)
Admission: RE | Admit: 2024-03-06 | Discharge: 2024-03-06 | Disposition: A | Discharge status: Home / Self Care | Payer: Self-pay | Source: Ambulatory Visit | Attending: Neurology | Admitting: Neurology

## 2024-03-06 DIAGNOSIS — R519 Headache, unspecified: Secondary | ICD-10-CM | POA: Insufficient documentation

## 2024-03-06 DIAGNOSIS — G5131 Clonic hemifacial spasm, right: Secondary | ICD-10-CM

## 2024-03-06 MED ORDER — GADOBUTROL 1 MMOL/ML IV SOLN
7.5000 mL | Freq: Once | INTRAVENOUS | Status: AC | PRN
Start: 2024-03-06 — End: 2024-03-06
  Administered 2024-03-06: 7.5 mL via INTRAVENOUS

## 2024-03-13 ENCOUNTER — Ambulatory Visit: Payer: Self-pay | Admitting: Neurology

## 2024-03-15 ENCOUNTER — Ambulatory Visit: Payer: Self-pay | Admitting: Neurology

## 2024-04-03 NOTE — Progress Notes (Signed)
Called and no answer left VM

## 2024-04-10 ENCOUNTER — Telehealth: Payer: Self-pay | Admitting: Neurology

## 2024-04-10 ENCOUNTER — Other Ambulatory Visit (HOSPITAL_COMMUNITY): Payer: Self-pay | Admitting: Interventional Radiology

## 2024-04-10 ENCOUNTER — Other Ambulatory Visit: Payer: Self-pay

## 2024-04-10 DIAGNOSIS — I671 Cerebral aneurysm, nonruptured: Secondary | ICD-10-CM

## 2024-04-10 NOTE — Telephone Encounter (Signed)
 Called patient and put orders in for this patient

## 2024-04-10 NOTE — Telephone Encounter (Signed)
 Pt. Would like results and only speaks spanish, you will need interp for spanish

## 2024-04-11 ENCOUNTER — Ambulatory Visit (HOSPITAL_COMMUNITY)
Admission: RE | Admit: 2024-04-11 | Discharge: 2024-04-11 | Disposition: A | Discharge status: Home / Self Care | Source: Ambulatory Visit | Attending: Interventional Radiology | Admitting: Interventional Radiology

## 2024-04-11 ENCOUNTER — Other Ambulatory Visit

## 2024-04-11 DIAGNOSIS — I671 Cerebral aneurysm, nonruptured: Secondary | ICD-10-CM

## 2024-04-15 ENCOUNTER — Telehealth: Payer: Self-pay | Admitting: Neurology

## 2024-04-15 NOTE — Telephone Encounter (Signed)
 Pt's daughter called in and left a message on 04/14/24 with the after hours service. Caller states the pt had MRI that showed aneurysm and that she would need surgery as soon as possible. States the pt's face is trembling- right side. Doctor told her that she would need botox before the surgery. Trembling has worsened and has a constant headache.  Full access nurse report is in Dr. Jamel Mc box

## 2024-04-15 NOTE — Telephone Encounter (Signed)
 Called Dr. Alvira Josephs office to get patient notes and to see if he does recommend Botox before surgery for aneurysm

## 2024-04-15 NOTE — Telephone Encounter (Signed)
 Susan Lopez and daughter [name] came in to talk to us . She stated the doctor told the Susan Lopez she needs to have the botox before the surgery because it will help reduce the inflammation.

## 2024-04-26 ENCOUNTER — Encounter (HOSPITAL_COMMUNITY): Payer: Self-pay | Admitting: Interventional Radiology

## 2024-05-06 ENCOUNTER — Encounter: Payer: Self-pay | Admitting: Family Medicine

## 2024-05-06 ENCOUNTER — Other Ambulatory Visit: Payer: Self-pay

## 2024-05-06 ENCOUNTER — Ambulatory Visit: Payer: Self-pay | Attending: Family Medicine | Admitting: Family Medicine

## 2024-05-06 VITALS — BP 133/79 | HR 70 | Ht 59.0 in | Wt 170.6 lb

## 2024-05-06 DIAGNOSIS — E785 Hyperlipidemia, unspecified: Secondary | ICD-10-CM

## 2024-05-06 DIAGNOSIS — I10 Essential (primary) hypertension: Secondary | ICD-10-CM

## 2024-05-06 DIAGNOSIS — R079 Chest pain, unspecified: Secondary | ICD-10-CM

## 2024-05-06 DIAGNOSIS — G5131 Clonic hemifacial spasm, right: Secondary | ICD-10-CM

## 2024-05-06 DIAGNOSIS — R7303 Prediabetes: Secondary | ICD-10-CM

## 2024-05-06 DIAGNOSIS — Z1211 Encounter for screening for malignant neoplasm of colon: Secondary | ICD-10-CM

## 2024-05-06 DIAGNOSIS — I671 Cerebral aneurysm, nonruptured: Secondary | ICD-10-CM

## 2024-05-06 DIAGNOSIS — K219 Gastro-esophageal reflux disease without esophagitis: Secondary | ICD-10-CM

## 2024-05-06 MED ORDER — ASPIRIN 81 MG PO TBEC: 81.0000 mg | DELAYED_RELEASE_TABLET | Freq: Every day | ORAL | 1 refills | Status: AC

## 2024-05-06 NOTE — Progress Notes (Signed)
 Subjective:  Patient ID: Susan Lopez, female    DOB: 1961-10-15  Age: 63 y.o. MRN: [id number]  CC: Medical Management of Chronic Issues (Chest pain/)     Discussed the use of AI scribe software for clinical note transcription with the patient, who gave verbal consent to proceed.  History of Present Illness The patient, with a history of hypertension, hyperlipidemia, history of D&C/hysteroscopy and MyoSure polypectomy in 01/2021, GERD, prediabetes, OSA, right hemifacial spasms, supraclinoid left ICA  aneurysm, presents with chest pain.  She has experienced chest pain for two weeks, occurring unpredictably at rest and during activities. The pain has gradually subsided, and she currently denies chest pain. Shortness of breath occurs separately from the chest pain.  She uses a CPAP machine for obstructive sleep apnea. She takes atorvastatin  for high cholesterol and lisinopril  for blood pressure management.  Last lipid panel revealed uncontrolled cholesterol.  She is monitored for prediabetes and is on diet control.  She is under neurosurgical care for a cerebral aneurysm on the left side of her brain, awaiting surgery. She has an upcoming appointment for Botox injections for facial spasms on the right side.    Past Medical History:  Diagnosis Date   Anxiety    Arthritis    Chronic gastritis    Chronic thoracic spine pain    Depression    GERD (gastroesophageal reflux disease)    Hemorrhoids, external 10-21-11   occ. bothersome   History of colon polyps    Hyperlipidemia    Hypertension    Iron deficiency anemia    OSA on CPAP    study in epic 08-29-2017 severe osa (AHI 32.5), per pt daughter uses nightly   PMB (postmenopausal bleeding)    PONV (postoperative nausea and vomiting)    Pre-diabetes     Past Surgical History:  Procedure Laterality Date   BREAST BIOPSY Left    CESAREAN SECTION  1985, 1992   CHOLECYSTECTOMY  10/27/2011   Procedure: LAPAROSCOPIC  CHOLECYSTECTOMY WITH INTRAOPERATIVE CHOLANGIOGRAM;  Surgeon: Elspeth KYM Schultze, MD;  Location: [facility];  Service: General;  Laterality: N/A;  Laparoscopic Chole w/ IOC Single Site   COLONOSCOPY WITH PROPOFOL   last one 05-09-2018   DILATATION & CURETTAGE/HYSTEROSCOPY WITH MYOSURE N/A 03/11/2020   Procedure: DILATATION AND CURETTAGE /HYSTEROSCOPY WITH MYOSURE;  Surgeon: Alger Gong, MD;  Location: [facility];  Service: Gynecology;  Laterality: N/A;   DILATATION & CURETTAGE/HYSTEROSCOPY WITH MYOSURE N/A 02/02/2021   Procedure: DILATATION & CURETTAGE/HYSTEROSCOPY WITH MYOSURE POLYPECTOMY;  Surgeon: Alger Gong, MD;  Location: [facility];  Service: Gynecology;  Laterality: N/A;   ESOPHAGOGASTRODUODENOSCOPY (EGD) WITH PROPOFOL   last one 12-30-2019  dr legrand   EYE SURGERY  10/21/2011   bil. for tissue growth-laser surgery   HARDWARE REMOVAL Left 03/16/2016   Procedure: LEFT ANKLE HARDWARE REMOVAL;  Surgeon: Kay CHRISTELLA Cummins, MD;  Location: [facility];  Service: Orthopedics;  Laterality: Left;   IR RADIOLOGIST EVAL & MGMT  04/12/2024   MM BREAST STEREO BIOPSY LEFT ([facility] HX) Left 2015   ORIF ANKLE FRACTURE Left 11/13/2015   Procedure: OPEN REDUCTION INTERNAL FIXATION (ORIF) LEFT BIMALLEOLAR ANKLE FRACTURE;  Surgeon: Kay CHRISTELLA Cummins, MD;  Location: [facility];  Service: Orthopedics;  Laterality: Left;    Family History  Problem Relation Age of Onset   Hypertension Mother    Cancer Mother    Hypertension Father    Arthritis Brother    Hypertension Brother    Intellectual disability Paternal  Aunt    Colon cancer Neg Hx     Social History   Socioeconomic History   Marital status: Single    Spouse name: Not on file   Number of children: 2   Years of education: Not on file   Highest education level: 4th grade  Occupational History   Occupation: unemployed  Tobacco Use   Smoking status: Former    Current packs/day: 0.00    Average packs/day: 0.3  packs/day for 10.0 years (2.5 ttl pk-yrs)    Types: Cigarettes    Start date: 34    Quit date: 2008    Years since quitting: 17.5   Smokeless tobacco: Never  Vaping Use   Vaping status: Never Used  Substance and Sexual Activity   Alcohol use: No   Drug use: Never   Sexual activity: Not Currently    Birth control/protection: Post-menopausal  Other Topics Concern   Not on file  Social History Narrative   Are you right handed or left handed? Right Handed    Are you currently employed ? No    What is your current occupation? none   Do you live at home alone? Yes    Who lives with you?    What type of home do you live in: 1 story or 2 story? Lives in a one story home       Social Drivers of Health   Financial Resource Strain: Not on file  Food Insecurity: No Food Insecurity (05/26/2022)   Hunger Vital Sign    Worried About Running Out of Food in the Last Year: Never true    Ran Out of Food in the Last Year: Never true  Transportation Needs: Unmet Transportation Needs (05/26/2022)   PRAPARE - Transportation    Lack of Transportation (Medical): Yes    Lack of Transportation (Non-Medical): Yes  Physical Activity: Insufficiently Active (09/28/2017)   Exercise Vital Sign    Days of Exercise per Week: 7 days    Minutes of Exercise per Session: 20 min  Stress: No Stress Concern Present (09/28/2017)   [employer] of Occupational Health - Occupational Stress Questionnaire    Feeling of Stress : Only a little  Social Connections: Somewhat Isolated (09/28/2017)   Social Connection and Isolation Panel    Frequency of Communication with Friends and Family: More than three times a week    Frequency of Social Gatherings with Friends and Family: Once a week    Attends Religious Services: More than 4 times per year    Active Member of [employer] or Organizations: No    Attends Banker Meetings: Never    Marital Status: Separated    Allergies  Allergen Reactions    Hydrocodone Nausea And Vomiting   Tramadol  Nausea Only   Oxycodone  Rash    Outpatient Medications Prior to Visit  Medication Sig Dispense Refill   atorvastatin  (LIPITOR) 40 MG tablet Take 1 tablet (40 mg total) by mouth daily. 90 tablet 1   fluticasone  (FLONASE ) 50 MCG/ACT nasal spray Place 2 sprays into both nostrils daily. 16 g 6   gabapentin  (NEURONTIN ) 300 MG capsule Take 1 capsule (300 mg total) by mouth 3 (three) times daily. 90 capsule 3   lisinopril  (ZESTRIL ) 10 MG tablet Take 1 tablet (10 mg total) by mouth daily. 90 tablet 1   meloxicam  (MOBIC ) 7.5 MG tablet Take 1 tablet (7.5 mg total) by mouth daily. 90 tablet 1   omeprazole  (PRILOSEC) 40 MG capsule TOME 1 PASTILLA  POR VIA ORAL DOS VECES AL DIA A REDUCIR EL ACIDO DEL ESTOMAGO 90 capsule 1   No facility-administered medications prior to visit.     ROS Review of Systems  Constitutional:  Negative for activity change and appetite change.  HENT:  Negative for sinus pressure and sore throat.   Respiratory:  Negative for chest tightness, shortness of breath and wheezing.   Cardiovascular:  Positive for chest pain. Negative for palpitations.  Gastrointestinal:  Negative for abdominal distention, abdominal pain and constipation.  Genitourinary: Negative.   Musculoskeletal: Negative.   Psychiatric/Behavioral:  Negative for behavioral problems and dysphoric mood.     Objective:  BP 133/79   Pulse 70   Ht 4' 11 (1.499 m)   Wt 170 lb 9.6 oz (77.4 kg)   LMP 10/08/2015 (Exact Date)   SpO2 95%   BMI 34.46 kg/m      05/06/2024   11:19 AM 02/22/2024   10:26 AM 02/06/2024   10:04 AM  BP/Weight  Systolic BP 133 143 142  Diastolic BP 79 81 73  Wt. (Lbs) 170.6 168 168  BMI 34.46 kg/m2 33.93 kg/m2 33.93 kg/m2      Physical Exam Constitutional:      Appearance: She is well-developed.  Cardiovascular:     Rate and Rhythm: Normal rate.     Heart sounds: Normal heart sounds. No murmur heard. Pulmonary:     Effort: Pulmonary  effort is normal.     Breath sounds: Normal breath sounds. No wheezing or rales.  Chest:     Chest wall: No tenderness.  Abdominal:     General: Bowel sounds are normal. There is no distension.     Palpations: Abdomen is soft. There is no mass.     Tenderness: There is no abdominal tenderness.  Musculoskeletal:        General: Normal range of motion.     Right lower leg: No edema.     Left lower leg: No edema.  Neurological:     Mental Status: She is alert and oriented to person, place, and time.  Psychiatric:        Mood and Affect: Mood normal.        Latest Ref Rng & Units 11/13/2023    9:23 AM 08/28/2023    7:25 PM 01/26/2023    1:43 PM  CMP  Glucose 70 - 99 mg/dL 895  885  851   BUN 8 - 27 mg/dL 9  5  11    Creatinine 0.57 - 1.00 mg/dL 9.35  9.41  9.27   Sodium 134 - 144 mmol/L 143  140  144   Potassium 3.5 - 5.2 mmol/L 4.8  3.7  4.3   Chloride 96 - 106 mmol/L 105  106  104   CO2 20 - 29 mmol/L 24  24  24    Calcium  8.7 - 10.3 mg/dL 9.6  8.7  9.7   Total Protein 6.0 - 8.5 g/dL 6.9  6.9  7.1   Total Bilirubin 0.0 - 1.2 mg/dL 0.4  0.4  0.3   Alkaline Phos 44 - 121 IU/L 130  104  136   AST 0 - 40 IU/L 12  20  28    ALT 0 - 32 IU/L 20  28  53     Lipid Panel     Component Value Date/Time   CHOL 229 (H) 11/13/2023 0923   TRIG 272 (H) 11/13/2023 0923   HDL 46 11/13/2023 0923   CHOLHDL 3.8 12/13/2017 1150  CHOLHDL 5.3 (H) 11/23/2016 0952   VLDL 61 (H) 11/23/2016 0952   LDLCALC 134 (H) 11/13/2023 0923    CBC    Component Value Date/Time   WBC 6.8 08/28/2023 1925   RBC 4.68 08/28/2023 1925   HGB 14.2 08/28/2023 1925   HGB 13.8 03/22/2023 1704   HGB 14.9 10/08/2013 0853   HCT 42.8 08/28/2023 1925   HCT 41.5 03/22/2023 1704   HCT 43.2 10/08/2013 0853   PLT 264 08/28/2023 1925   PLT 281 03/22/2023 1704   MCV 91.5 08/28/2023 1925   MCV 91 03/22/2023 1704   MCV 92.8 10/08/2013 0853   MCH 30.3 08/28/2023 1925   MCHC 33.2 08/28/2023 1925   RDW 12.0 08/28/2023  1925   RDW 14.4 03/22/2023 1704   RDW 12.8 10/08/2013 0853   LYMPHSABS 1.0 03/22/2023 1704   LYMPHSABS 2.1 10/08/2013 0853   MONOABS 0.4 03/07/2018 1046   MONOABS 0.4 10/08/2013 0853   EOSABS 0.0 03/22/2023 1704   BASOSABS 0.0 03/22/2023 1704   BASOSABS 0.1 10/08/2013 9146    Lab Results  Component Value Date   HGBA1C 6.0 (H) 11/13/2023       Assessment & Plan Chest Pain EKG revealed nonspecific ST changes in anterolateral leads which is new compared to EKG of 10/2022 -Chest pain is absent at the moment - Advised to go to the ED if chest pain occurs - Refer to cardiologist for evaluation and stress test. - Advise daily aspirin  intake. - Check cholesterol levels. - Order blood work for kidney and liver function.  Hyperlipidemia -Uncontrolled from last set of labs High cholesterol managed with atorvastatin . Potential need for dosage increase if levels remain high. - Check cholesterol levels. - Consider increasing atorvastatin  dosage if cholesterol levels remain high.  Supraclinoid left ICA aneurysm Known left cerebral aneurysm under neurosurgical care. Botox for right facial spasms. Awaiting surgery. - Continue neurosurgical follow-up and Botox treatment. - Await surgery scheduling.  Hypertension Blood pressure well-controlled with lisinopril . - Continue lisinopril . -Counseled on blood pressure goal of less than 130/80, low-sodium, DASH diet, medication compliance, 150 minutes of moderate intensity exercise per week. Discussed medication compliance, adverse effects.   Obstructive Sleep Apnea Managed with CPAP therapy.  Prediabetes Last A1c was 6.0 Monitored for prediabetes with dietary management focus. - Order diabetes screening test. - Provide dietary counseling.  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 Swallow whole.    Dispense:  90 tablet    Refill:  1    Follow-up: Return in about 6 months (around 11/06/2024) for Chronic medical conditions.       Corrina Sabin, MD, FAAFP. [facility] [place], [place] [phone]   05/06/2024, 12:06 PM

## 2024-05-06 NOTE — Patient Instructions (Signed)
 Prediabetes: Plan de alimentacin Prediabetes: Eating Plan La prediabetes es cuando los niveles de azcar en la sangre, tambin llamada glucosa, son ms altos de lo normal. Esto puede ponerlo en riesgo de desarrollar diabetes tipo 2. Cuando tiene prediabetes, hacer cambios saludables puede ayudar a evitar que desarrolle diabetes. Esto incluye hacer cambios en la dieta. Trabaje con su mdico o con un experto en alimentacin saludable llamado nutricionista. Ellos pueden ayudarlo a crear un plan de alimentacin saludable. Este plan puede ser de ayuda para: Controlar los niveles de Banker. Mejorar los niveles de [place]. Controlar la presin arterial. Consejos para seguir este plan? Lea las etiquetas de los alimentos Lea las etiquetas de los alimentos envasados para controlar la cantidad de grasa y International aid/development worker que contienen. Evite los alimentos que contengan lo siguiente: Grasas saturadas. Grasas trans. Azcares agregados. Revise la cantidad de sal (sodio) en las etiquetas de los alimentos. Evite los alimentos que contengan ms de 300 miligramos (mg) de sal por porcin. Limite el consumo de sal a menos de 2,300 mg por da. Al ir de compras Evite comprar alimentos procesados y preelaborados. Evite comprar bebidas con azcar agregada. Al cocinar Cocine con aceite de oliva. No use: Mantequilla. Manteca de cerdo. Mantequilla clarificada. Cocine los alimentos al horno, a la parrilla, asados, al vapor o hervidos. Evite frerlos. Planificacin de las comidas  Trabaje con el nutricionista para crear un plan de alimentacin que sea adecuado para usted. Esto puede incluir el seguimiento de cuntas caloras ingiere al da. Use un registro de alimentos, un cuaderno o una aplicacin mvil para anotar lo que comi en cada comida. Considere la posibilidad de seguir Clinical cytogeneticist. Esto incluye: Comer muchas porciones de frutas y verduras frescas por Futures trader. Pescado al [employer] veces por  semana. Comer una porcin de cereales integrales, frijoles, frutos secos y semillas por da. Aceite de Location manager de otras grasas. Limitar el consumo de alcohol. Limitar la carne roja. Usar productos lcteos descremados o con bajo contenido de [place]. Considere seguir Neomia Dear dieta a base de vegetales. Esto significa comer principalmente: Verduras y frutas. Granos. Frijoles. Frutos secos y semillas. Si tiene hipertensin arterial, quizs deba limitar el consumo de sal o seguir una dieta como el plan de alimentacin de Enfoques Alimentarios para Detener la Hipertensin (Dietary Approaches to Stop Hypertension, DASH). El plan de alimentacin DASH puede ayudar a disminuir la presin arterial alta. Estilo de vida Establezca metas para bajar de peso con la ayuda de su equipo de atencin mdica. Bajar el 7 % del peso corporal es un buen objetivo para la mayora de las personas con prediabetes. Haga al menos 30 minutos de ejercicio, 5 o ms das a la semana. Para obtener apoyo, piense en unirse a un grupo de apoyo o Heritage manager con un consejero de salud mental. Tome los medicamentos nicamente como se lo hayan indicado. Qu alimentos se recomiendan? Nils Pyle Bayas. Bananas. Manzanas. Naranjas. Uvas. Papaya. Mango. Granada. Kiwi. Pomelo. Cerezas. Hoover Brunette Deatra James. Espinaca. Guisantes. Remolachas. Coliflor. Repollo. Brcoli. Zanahorias. Tomates. Calabaza. Christella Noa. Hierbas. Pimientos. Cebollas. Pepinos. Coles de Bruselas. Granos Cereales integrales, como pan o pasta de trigo integral o de otros cereales integrales. Avena sin azcar. Trigo burgol. Cebada. Quinua. Arroz integral. Tacos o tortillas de harina de maz o de salvado. Carnes y otras protenas Frutos de mar. Carne de ave sin piel. Cortes magros de cerdo y carne de res. Tofu. Huevos. Frutos secos. Frijoles. Lcteos Productos lcteos descremados o semidescremados, como yogur, queso cottage y [place]. Bebidas  Agua. T. Caf. Gaseosas sin azcar o  dietticas. Soda. Leche descremada o con bajo contenido de Piqua. Productos alternativos a la [place], como [place] de soja o de [place]. Grasas y aceites Aceite de [place]. Aceite de canola. Aceite de girasol. Aceite de semillas de uva. Aguacate. Nueces. Dulces y postres Pudin sin azcar o con bajo contenido de [place]. Helado y otros postres congelados sin azcar o con bajo contenido de [place]. Alios y condimentos Hierbas. Especias sin sal. Mostaza. Salsa de pepinillos. Ktchup con bajo contenido de sal y de International aid/development worker. Salsa barbacoa con bajo contenido de sal y de azcar. Mayonesa con bajo contenido de grasa o sin grasa. Es posible que los productos que se enumeran ms arriba no sean todos los alimentos y las bebidas que puede consumir. Consulte a su nutricionista para obtener ms informacin. Qu alimentos no se recomiendan? Nils Pyle Frutas enlatadas al almbar. Verduras Verduras enlatadas. Verduras congeladas con mantequilla o salsa de crema. Granos Productos elaborados con [place] y [place], como panes, pastas, bocadillos y cereales. Carnes y [address] de carne con alto contenido de Holiday representative. Carne de ave con piel. Carne empanizada o frita. Carnes procesadas. Lcteos Yogur, queso o [employer]. Bebidas Bebidas azucaradas, como t helado y [place]. Grasas y Barnes [employer] Noble. Manteca de cerdo. Mantequilla clarificada. Dulces y [employer], como pasteles, [place], [place], galletas dulces y tarta de [place]. Alios y condimentos Mezclas de especias con sal agregada. Ktchup. Salsa barbacoa. Mayonesa. Es posible que los productos que se enumeran ms arriba no sean todos los alimentos y las bebidas que Personnel officer. Consulte a su nutricionista para obtener ms informacin. Dnde obtener ms informacin American Diabetes Association (Asociacin Estadounidense de la Diabetes): diabetes.org/food-nutrition Esta informacin no tiene Theme park manager  el consejo del mdico. Asegrese de hacerle al mdico cualquier pregunta que tenga. Document Revised: 06/22/2023 Document Reviewed: 06/22/2023 Elsevier Patient Education  2024 [employer].

## 2024-05-07 LAB — CMP14+EGFR
ALT: 23 IU/L (ref 0–32)
AST: 16 IU/L (ref 0–40)
Albumin: 4.3 g/dL (ref 3.9–4.9)
Alkaline Phosphatase: 130 IU/L — ABNORMAL HIGH (ref 44–121)
BUN/Creatinine Ratio: 14 (ref 12–28)
BUN: 8 mg/dL (ref 8–27)
Bilirubin Total: 0.6 mg/dL (ref 0.0–1.2)
CO2: 21 mmol/L (ref 20–29)
Calcium: 8.9 mg/dL (ref 8.7–10.3)
Chloride: 107 mmol/L — ABNORMAL HIGH (ref 96–106)
Creatinine, Ser: 0.58 mg/dL (ref 0.57–1.00)
Globulin, Total: 2.6 g/dL (ref 1.5–4.5)
Glucose: 103 mg/dL — ABNORMAL HIGH (ref 70–99)
Potassium: 4.1 mmol/L (ref 3.5–5.2)
Sodium: 143 mmol/L (ref 134–144)
Total Protein: 6.9 g/dL (ref 6.0–8.5)
eGFR: 102 mL/min/1.73 (ref >59)

## 2024-05-07 LAB — HEMOGLOBIN A1C
Est. average glucose Bld gHb Est-mCnc: 126 mg/dL
Hgb A1c MFr Bld: 6 % — ABNORMAL HIGH (ref 4.8–5.6)

## 2024-05-07 LAB — LP+NON-HDL CHOLESTEROL
Cholesterol, Total: 161 mg/dL (ref 100–199)
HDL: 48 mg/dL (ref >39)
LDL Chol Calc (NIH): 88 mg/dL (ref 0–99)
Total Non-HDL-Chol (LDL+VLDL): 113 mg/dL (ref 0–129)
Triglycerides: 145 mg/dL (ref 0–149)
VLDL Cholesterol Cal: 25 mg/dL (ref 5–40)

## 2024-05-08 ENCOUNTER — Ambulatory Visit: Payer: Self-pay | Admitting: Family Medicine

## 2024-05-08 ENCOUNTER — Other Ambulatory Visit: Payer: Self-pay

## 2024-05-08 DIAGNOSIS — K635 Polyp of colon: Secondary | ICD-10-CM

## 2024-05-08 DIAGNOSIS — E785 Hyperlipidemia, unspecified: Secondary | ICD-10-CM

## 2024-05-08 MED ORDER — ATORVASTATIN CALCIUM 40 MG PO TABS
40.0000 mg | ORAL_TABLET | Freq: Every day | ORAL | 1 refills | Status: AC
  Filled 2024-05-08 – 2024-08-30 (×2): qty 90, 90d supply, fill #0
  Filled 2024-12-03: qty 90, 90d supply, fill #1

## 2024-05-10 ENCOUNTER — Ambulatory Visit (INDEPENDENT_AMBULATORY_CARE_PROVIDER_SITE_OTHER): Payer: Self-pay | Admitting: Neurology

## 2024-05-10 DIAGNOSIS — G245 Blepharospasm: Secondary | ICD-10-CM

## 2024-05-10 MED ORDER — INCOBOTULINUMTOXINA 50 UNITS IM SOLR
50.0000 [IU] | INTRAMUSCULAR | Status: AC
  Administered 2024-05-10: 35 [IU] via INTRAMUSCULAR

## 2024-05-10 NOTE — Procedures (Signed)
 [facility]   History:  Diagnosis: blepharospasm (significantly more blepharospasm today than last visit, although the right is certainly more than the left)   Result History  N/A, initial visit  Consent obtained from: The patient (translated by medical translator, who is present) Benefits discussed included, but were not limited to decreased muscle tightness, increased joint range of motion, and decreased pain.  Risk discussed included, but were not limited pain and discomfort, bleeding, bruising, excessive weakness, venous thrombosis, muscle atrophy and dysphagia.  A copy of the patient medication guide was given to the patient which explains the blackbox warning.  Patients identity and treatment sites confirmed Yes.  .  Details of Procedure: Skin was cleaned with alcohol.  A 27-gauge, half inch needle was injected into the target muscle.  Prior to injection, the needle plunger was aspirated to make sure the needle was not within a blood vessel.  There was no blood retrieved on aspiration.    Following is a summary of the muscles injected  And the amount of incobotulinumtoxin A used:  Injections  Location Left  Right Units Number of sites        Corrugator      Frontalis      Lower Lid, Lateral 2.5 2.5 5   Lower Lid Medial  2.5 2.5   Upper Lid, Lateral 2.5 2.5 5   Upper Lid, Medial      Canthus 5.0 5.0 10 1  nasalis  2.5 2.5   Masseter      Procerus      Zygomaticus Major 2.5/2.5 2.5/2.5 10   TOTAL UNITS:   35    Agent: Incobotulinumtoxin A (Xeomin ).  1 vials of Botox were used, each containing 50 units and freshly diluted with 2 mL of sterile, non-perserved saline   Total injected (Units): 35  Total wasted (Units): 15 Pt tolerated procedure well without complications.   Reinjection is anticipated in 3 months.

## 2024-05-11 LAB — FECAL OCCULT BLOOD, IMMUNOCHEMICAL: Fecal Occult Bld: NEGATIVE

## 2024-05-13 NOTE — Addendum Note (Signed)
 Addended by: Celita Aron on: 05/13/2024 12:24 PM   Modules accepted: Orders

## 2024-05-14 ENCOUNTER — Telehealth (HOSPITAL_COMMUNITY): Payer: Self-pay

## 2024-05-14 NOTE — Telephone Encounter (Signed)
 Returned pt's call, no answer, left vm. AB

## 2024-05-17 ENCOUNTER — Telehealth: Payer: Self-pay

## 2024-05-17 ENCOUNTER — Ambulatory Visit: Admitting: Neurology

## 2024-05-17 NOTE — Telephone Encounter (Signed)
 Noted.    Copied from CRM [id number]. Topic: Clinical - Request for Lab/Test Order >> May 17, 2024  2:04 PM Turkey B wrote: Reason for CRM: pt called in, I gave her the lab results

## 2024-05-29 ENCOUNTER — Telehealth: Payer: Self-pay | Admitting: Family Medicine

## 2024-05-29 NOTE — Telephone Encounter (Signed)
 Copied from CRM [id number]. Topic: General - Other >> May 29, 2024  4:40 PM Delon DASEN wrote:  Reason for CRM: returning call from office, no message

## 2024-05-29 NOTE — Telephone Encounter (Signed)
 Noted

## 2024-05-30 ENCOUNTER — Telehealth (HOSPITAL_COMMUNITY): Payer: Self-pay

## 2024-05-30 ENCOUNTER — Other Ambulatory Visit (HOSPITAL_COMMUNITY): Payer: Self-pay | Admitting: Radiology

## 2024-05-30 DIAGNOSIS — I671 Cerebral aneurysm, nonruptured: Secondary | ICD-10-CM

## 2024-05-30 NOTE — Telephone Encounter (Signed)
 Pt called to schedule treatment. I informed her that Dr. Dolphus is leaving and that we would get her referred to another physician. She was fine with this plan. AB

## 2024-06-13 ENCOUNTER — Telehealth: Payer: Self-pay | Admitting: Neurology

## 2024-06-13 NOTE — Telephone Encounter (Signed)
 Pt came in and wanted to leave Dr. Evonnie a message through an interpreter. She is supposed to go see [facility] and they do not accept the [employer]. She is wondering if she can be referred somewhere that takes that?

## 2024-06-14 NOTE — Telephone Encounter (Signed)
 Called pateint with a spanish interpreter spoke to daughter but she was at work. Called patient and she did not answer went to voicemail left voicemail

## 2024-07-02 ENCOUNTER — Encounter: Payer: Self-pay | Admitting: Family Medicine

## 2024-07-09 NOTE — Progress Notes (Unsigned)
 Assessment/Plan:   1.  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  Her first injections were May 10, 2024.  The blepharospasm much better.  She noted benefit but still has significant twitch over the R zygomaticus.  We will increase the dose.  She has baseline facial asymmetry because of hemifacial spasm.  She knows that both hemifacial spasm and Botox can cause facial droop.  2.  Supraclinoid aneurysm, left  -Refer to Dr. Lester Subjective:   Susan Lopez was seen today in follow up after her first Botox which was done on July 11.  Medical translator present.  When I saw her at the time of Botox, she had quite a bit of blepharospasm, as opposed to just her primary complaint of hemifacial spasm.  Botox was injected on May 10, 2024.  She reports today that the spasms are a less than before the botox but still present. 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 Dolphus.  She reports that she went to the appt and then they cx the appt due to her lack of insurance    CURRENT MEDICATIONS:  Outpatient Encounter Medications as of 07/11/2024  Medication Sig   aspirin  EC 81 MG tablet Take 1 tablet (81 mg total) by mouth daily. Swallow whole.   atorvastatin  (LIPITOR) 40 MG tablet Take 1 tablet (40 mg total) by mouth daily.   fluticasone  (FLONASE ) 50 MCG/ACT nasal spray Place 2 sprays into both nostrils daily.   gabapentin  (NEURONTIN ) 300 MG capsule Take 1 capsule (300 mg total) by mouth 3 (three) times daily.   lisinopril  (ZESTRIL ) 10 MG tablet Take 1 tablet (10 mg total) by mouth daily.   magnesium oxide (MAG-OX) 400 (240 Mg) MG tablet Take 250 mg by mouth daily.   meloxicam  (MOBIC ) 7.5 MG tablet Take 1 tablet (7.5 mg total) by mouth daily.   omeprazole  (PRILOSEC)  40 MG capsule TOME 1 PASTILLA POR VIA ORAL DOS VECES AL DIA A REDUCIR EL ACIDO DEL ESTOMAGO   Facility-Administered Encounter Medications as of 07/11/2024  Medication   incobotulinumtoxinA  (XEOMIN ) 50 units injection 50 Units     Objective:   PHYSICAL EXAMINATION:    VITALS:   Vitals:   07/11/24 1308  BP: 114/72  Pulse: 90  SpO2: 95%  Weight: 171 lb (77.6 kg)    GEN:  The patient appears stated age and is in NAD. HEENT:  Normocephalic, atraumatic.  The mucous membranes are moist. The superficial temporal arteries are without ropiness or tenderness. CV:  RRR Lungs:  CTAB Neck/HEME:  There are no carotid bruits bilaterally.  Neurological examination:  Orientation: The patient is alert and oriented x3. Cranial nerves: There is good facial symmetry.she has difficulty holding air in the cheek on the right (occurred prior to Botox injections).  She does not speak this examiner's language to assess fluency and clarity.. Soft palate rises symmetrically and there is no tongue deviation. Hearing is intact to conversational tone.  She has spasm around right zygomaticus, but no longer see the blepharospasm or the spasm around orbicularis oculi. Sensation: Sensation is intact to light touch throughout Motor: Strength is at least antigravity x4.  Movement examination: Tone: There is normal tone in the UE/LE Abnormal movements:  no tremor.  No myoclonus.  No asterixis.  Coordination:  There is no decremation with RAM's. Gait and Station: The patient has no difficulty arising out of a deep-seated chair without the use of the hands. The patient's stride length is good.      Total time spent on today's visit was 30 minutes, including both face-to-face time and nonface-to-face time.  Time included that spent on review of records (prior notes available to me/labs/imaging if pertinent), discussing treatment and goals, answering patient's questions and coordinating care.  Cc:  Newlin, Enobong,  MD

## 2024-07-11 ENCOUNTER — Ambulatory Visit: Payer: Self-pay | Admitting: Neurology

## 2024-07-11 ENCOUNTER — Encounter: Payer: Self-pay | Admitting: Neurology

## 2024-07-11 VITALS — BP 114/72 | HR 90 | Wt 171.0 lb

## 2024-07-11 DIAGNOSIS — I671 Cerebral aneurysm, nonruptured: Secondary | ICD-10-CM

## 2024-07-11 DIAGNOSIS — G245 Blepharospasm: Secondary | ICD-10-CM

## 2024-07-11 DIAGNOSIS — G5131 Clonic hemifacial spasm, right: Secondary | ICD-10-CM

## 2024-07-17 ENCOUNTER — Ambulatory Visit (INDEPENDENT_AMBULATORY_CARE_PROVIDER_SITE_OTHER): Payer: Self-pay | Admitting: Neuroradiology

## 2024-07-17 ENCOUNTER — Encounter: Payer: Self-pay | Admitting: Neurology

## 2024-07-17 ENCOUNTER — Encounter: Payer: Self-pay | Admitting: Neuroradiology

## 2024-07-17 ENCOUNTER — Telehealth: Payer: Self-pay | Admitting: Neurology

## 2024-07-17 VITALS — BP 147/85 | HR 83 | Ht 59.0 in | Wt 170.0 lb

## 2024-07-17 DIAGNOSIS — I671 Cerebral aneurysm, nonruptured: Secondary | ICD-10-CM

## 2024-07-17 NOTE — Telephone Encounter (Signed)
 Pt came in today and stated that her doctor Nancyann Burns  wanted Doctor  Tat to prescribe Aneurysm prescription. Thanks

## 2024-07-17 NOTE — Progress Notes (Signed)
 I had the pleasure of meeting this nice lady in the office today.  She is from [place].  She has been troubled by a very painful right hemifacial spasm for almost a year.  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  This is on the posterior surface of the artery, proximal to the posterior communicating artery.  It has a wide neck and smooth.  I have reviewed the natural history of incidentally detected unruptured brain aneurysms.  I explained that this type of aneurysm has a relatively low risk of bleeding, estimated at most 1 %/year, possibly less.  I have explained that the aneurysm can be treated with endovascular surgery, with a risk of a serious complication around 1 to 2%.  She has had 1 Botox injection which helped for a few days, but the effect was not durable.  Otherwise is still having severe symptoms from the hemifacial spasm.  I reviewed her medical history which is notable for hypertension and dyslipidemia.  She has no history of heart disease or lung disease.  I have reviewed her MRI and MRA in detail.  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  The aneurysm is unrelated to the right hemifacial spasm.  Recommendation:  I have explained the diagnosis of an unruptured aneurysm.  I have explained that aneurysms may bleed (rupture), and if this happens it can be fatal or disabling.  I have also explained that some aneurysms have a very low statistical risk of bleeding, in which case the risk of surgery may be higher than the risk of no surgery.  In this particular case, I think the risk of bleeding of the aneurysm is sufficiently low that observation is a good option.  I have  explained that I am unable to provide any guarantees that the aneurysm will not bleed, and as such I did offer to provide surgical treatment if understanding the above information that was still her preference.  She is comfortable with the plan of observation.  We will check an MRI in 1 year.

## 2024-07-29 ENCOUNTER — Ambulatory Visit (HOSPITAL_COMMUNITY): Payer: Self-pay

## 2024-08-01 ENCOUNTER — Encounter: Payer: Self-pay | Admitting: Cardiology

## 2024-08-01 ENCOUNTER — Ambulatory Visit: Payer: Self-pay | Attending: Cardiology | Admitting: Cardiology

## 2024-08-01 ENCOUNTER — Other Ambulatory Visit: Payer: Self-pay

## 2024-08-01 VITALS — BP 154/84 | HR 71 | Ht 59.0 in | Wt 170.8 lb

## 2024-08-01 DIAGNOSIS — I119 Hypertensive heart disease without heart failure: Secondary | ICD-10-CM

## 2024-08-01 DIAGNOSIS — Z01812 Encounter for preprocedural laboratory examination: Secondary | ICD-10-CM

## 2024-08-01 DIAGNOSIS — R9431 Abnormal electrocardiogram [ECG] [EKG]: Secondary | ICD-10-CM

## 2024-08-01 DIAGNOSIS — R072 Precordial pain: Secondary | ICD-10-CM

## 2024-08-01 DIAGNOSIS — R0609 Other forms of dyspnea: Secondary | ICD-10-CM

## 2024-08-01 MED ORDER — LISINOPRIL 10 MG PO TABS
10.0000 mg | ORAL_TABLET | Freq: Two times a day (BID) | ORAL | 3 refills | Status: AC
  Filled 2024-08-01: qty 60, 30d supply, fill #0
  Filled 2024-08-30: qty 180, 90d supply, fill #1
  Filled 2024-12-03: qty 120, 60d supply, fill #2

## 2024-08-01 MED ORDER — METOPROLOL TARTRATE 100 MG PO TABS
ORAL_TABLET | ORAL | 0 refills | Status: AC
  Filled 2024-08-01: qty 1, 1d supply, fill #0

## 2024-08-01 NOTE — Progress Notes (Signed)
 Cardiology Office Note:    Date:  08/01/2024   ID:  Susan Lopez, DOB 1961-07-28, MRN [id number]  PCP:  Delbert Clam, MD  Cardiologist:  Aggie Douse, DO  Electrophysiologist:  None   Referring MD: Delbert Clam, MD    I am having chest pain   History of Present Illness:    Susan Lopez is a 63 y.o. female with a hx of aortic aneurysm, she follows with both neurology and neurosurgery, GERD, depression, hypertension, hyperlipidemia, OSA on CPAP, prediabetes, and morbid obesity. She was referred by her primary care physician Dr. Newlin for chest discomfort.  Discussed the use of AI scribe software for clinical note transcription with the patient, who gave verbal consent to proceed  She experiences chest pain described as a sensation of pressure on the chest, accompanied by episodes of tachycardia and shortness of breath. She attempts to alleviate these symptoms by opening her arms and taking deep breaths.  The biggest concern is fact that the sensation is becoming more frequent.  An EKG performed in July showed abnormalities. She has not previously seen a cardiologist.  She has high blood pressure, managed with lisinopril  10 mg at night. Blood pressure readings have been consistently high since January, with a recent reading of 154/86.    Past Medical History:  Diagnosis Date   Anxiety    Arthritis    Chronic gastritis    Chronic thoracic spine pain    Depression    GERD (gastroesophageal reflux disease)    Hemorrhoids, external 10-21-11   occ. bothersome   History of colon polyps    Hyperlipidemia    Hypertension    Iron deficiency anemia    OSA on CPAP    study in epic 08-29-2017 severe osa (AHI 32.5), per pt daughter uses nightly   PMB (postmenopausal bleeding)    PONV (postoperative nausea and vomiting)    Pre-diabetes     Past Surgical History:  Procedure Laterality Date   BREAST BIOPSY Left    CESAREAN SECTION  1985, 1992    CHOLECYSTECTOMY  10/27/2011   Procedure: LAPAROSCOPIC CHOLECYSTECTOMY WITH INTRAOPERATIVE CHOLANGIOGRAM;  Surgeon: Elspeth KYM Schultze, MD;  Location: [facility];  Service: General;  Laterality: N/A;  Laparoscopic Chole w/ IOC Single Site   COLONOSCOPY WITH PROPOFOL   last one 05-09-2018   DILATATION & CURETTAGE/HYSTEROSCOPY WITH MYOSURE N/A 03/11/2020   Procedure: DILATATION AND CURETTAGE /HYSTEROSCOPY WITH MYOSURE;  Surgeon: Alger Gong, MD;  Location: [facility];  Service: Gynecology;  Laterality: N/A;   DILATATION & CURETTAGE/HYSTEROSCOPY WITH MYOSURE N/A 02/02/2021   Procedure: DILATATION & CURETTAGE/HYSTEROSCOPY WITH MYOSURE POLYPECTOMY;  Surgeon: Alger Gong, MD;  Location: [facility];  Service: Gynecology;  Laterality: N/A;   ESOPHAGOGASTRODUODENOSCOPY (EGD) WITH PROPOFOL   last one 12-30-2019  dr legrand   EYE SURGERY  10/21/2011   bil. for tissue growth-laser surgery   HARDWARE REMOVAL Left 03/16/2016   Procedure: LEFT ANKLE HARDWARE REMOVAL;  Surgeon: Kay CHRISTELLA Cummins, MD;  Location:  SURGERY CENTER;  Service: Orthopedics;  Laterality: Left;   IR RADIOLOGIST EVAL & MGMT  04/12/2024   MM BREAST STEREO BIOPSY LEFT ([facility] HX) Left 2015   ORIF ANKLE FRACTURE Left 11/13/2015   Procedure: OPEN REDUCTION INTERNAL FIXATION (ORIF) LEFT BIMALLEOLAR ANKLE FRACTURE;  Surgeon: Kay CHRISTELLA Cummins, MD;  Location: [facility];  Service: Orthopedics;  Laterality: Left;    Current Medications: Current Meds  Medication Sig   aspirin  EC 81 MG tablet Take 1 tablet (81  mg total) by mouth daily. Swallow whole.   atorvastatin  (LIPITOR) 40 MG tablet Take 1 tablet (40 mg total) by mouth daily.   fluticasone  (FLONASE ) 50 MCG/ACT nasal spray Place 2 sprays into both nostrils daily.   gabapentin  (NEURONTIN ) 300 MG capsule Take 1 capsule (300 mg total) by mouth 3 (three) times daily.   lisinopril  (ZESTRIL ) 10 MG tablet Take 1 tablet (10 mg total) by mouth 2 (two) times daily.   magnesium  oxide (MAG-OX) 400 (240 Mg) MG tablet Take 250 mg by mouth daily.   meloxicam  (MOBIC ) 7.5 MG tablet Take 1 tablet (7.5 mg total) by mouth daily.   metoprolol tartrate (LOPRESSOR) 100 MG tablet Take 2 hours prior to CT   Multiple Vitamin (MULTIVITAMIN) tablet Take 1 tablet by mouth daily.   omeprazole  (PRILOSEC) 40 MG capsule TOME 1 PASTILLA POR VIA ORAL DOS VECES AL DIA A REDUCIR EL ACIDO DEL ESTOMAGO   [DISCONTINUED] lisinopril  (ZESTRIL ) 10 MG tablet Take 1 tablet (10 mg total) by mouth daily.   Current Facility-Administered Medications for the 08/01/24 encounter (Office Visit) with Taleeya Blondin, DO  Medication   incobotulinumtoxinA  (XEOMIN ) 50 units injection 50 Units     Allergies:   Hydrocodone, Tramadol , and Oxycodone    Social History   Socioeconomic History   Marital status: Single    Spouse name: Not on file   Number of children: 2   Years of education: Not on file   Highest education level: 4th grade  Occupational History   Occupation: unemployed  Tobacco Use   Smoking status: Former    Current packs/day: 0.00    Average packs/day: 0.3 packs/day for 10.0 years (2.5 ttl pk-yrs)    Types: Cigarettes    Start date: 17    Quit date: 2008    Years since quitting: 17.7   Smokeless tobacco: Never  Vaping Use   Vaping status: Never Used  Substance and Sexual Activity   Alcohol use: No   Drug use: Never   Sexual activity: Not Currently    Birth control/protection: Post-menopausal  Other Topics Concern   Not on file  Social History Narrative   Are you right handed or left handed? Right Handed    Are you currently employed ? No    What is your current occupation? none   Do you live at home alone? Yes    Who lives with you?    What type of home do you live in: 1 story or 2 story? Lives in a one story home       Social Drivers of Health   Financial Resource Strain: Not on file  Food Insecurity: No Food Insecurity (05/26/2022)   Hunger Vital Sign    Worried About  Running Out of Food in the Last Year: Never true    Ran Out of Food in the Last Year: Never true  Transportation Needs: Unmet Transportation Needs (05/26/2022)   PRAPARE - Transportation    Lack of Transportation (Medical): Yes    Lack of Transportation (Non-Medical): Yes  Physical Activity: Insufficiently Active (09/28/2017)   Exercise Vital Sign    Days of Exercise per Week: 7 days    Minutes of Exercise per Session: 20 min  Stress: No Stress Concern Present (09/28/2017)   [employer] of Occupational Health - Occupational Stress Questionnaire    Feeling of Stress : Only a little  Social Connections: Somewhat Isolated (09/28/2017)   Social Connection and Isolation Panel    Frequency of Communication with Friends  and Family: More than three times a week    Frequency of Social Gatherings with Friends and Family: Once a week    Attends Religious Services: More than 4 times per year    Active Member of [employer] or Organizations: No    Attends Banker Meetings: Never    Marital Status: Separated     Family History: The patient's family history includes Arthritis in her brother; Cancer in her mother; Hypertension in her brother, father, and mother; Intellectual disability in her paternal aunt. There is no history of Colon cancer.  ROS:   Review of Systems  Constitution: Negative for decreased appetite, fever and weight gain.  HENT: Negative for congestion, ear discharge, hoarse voice and sore throat.   Eyes: Negative for discharge, redness, vision loss in right eye and visual halos.  Cardiovascular: Negative for chest pain, dyspnea on exertion, leg swelling, orthopnea and palpitations.  Respiratory: Negative for cough, hemoptysis, shortness of breath and snoring.   Endocrine: Negative for heat intolerance and polyphagia.  Hematologic/Lymphatic: Negative for bleeding problem. Does not bruise/bleed easily.  Skin: Negative for flushing, nail changes, rash and suspicious  lesions.  Musculoskeletal: Negative for arthritis, joint pain, muscle cramps, myalgias, neck pain and stiffness.  Gastrointestinal: Negative for abdominal pain, bowel incontinence, diarrhea and excessive appetite.  Genitourinary: Negative for decreased libido, genital sores and incomplete emptying.  Neurological: Negative for brief paralysis, focal weakness, headaches and loss of balance.  Psychiatric/Behavioral: Negative for altered mental status, depression and suicidal ideas.  Allergic/Immunologic: Negative for HIV exposure and persistent infections.    EKGs/Labs/Other Studies Reviewed:    The following studies were reviewed today:   EKG:  The ekg ordered today demonstrates   Recent Labs: 08/28/2023: Hemoglobin 14.2; Platelets 264; TSH 1.350 05/06/2024: ALT 23; BUN 8; Creatinine, Ser 0.58; Potassium 4.1; Sodium 143  Recent Lipid Panel    Component Value Date/Time   CHOL 161 05/06/2024 1205   TRIG 145 05/06/2024 1205   HDL 48 05/06/2024 1205   CHOLHDL 3.8 12/13/2017 1150   CHOLHDL 5.3 (H) 11/23/2016 0952   VLDL 61 (H) 11/23/2016 0952   LDLCALC 88 05/06/2024 1205    Physical Exam:    VS:  BP (!) 154/84   Pulse 71   Ht 4' 11 (1.499 m)   Wt 170 lb 12.8 oz (77.5 kg)   LMP 10/08/2015 (Exact Date)   SpO2 98%   BMI 34.50 kg/m     Wt Readings from Last 3 Encounters:  08/01/24 170 lb 12.8 oz (77.5 kg)  07/17/24 170 lb (77.1 kg)  07/11/24 171 lb (77.6 kg)     GEN: Well nourished, well developed in no acute distress HEENT: Normal NECK: No JVD; No carotid bruits LYMPHATICS: No lymphadenopathy CARDIAC: S1S2 noted,RRR, no murmurs, rubs, gallops RESPIRATORY:  Clear to auscultation without rales, wheezing or rhonchi  ABDOMEN: Soft, non-tender, non-distended, +bowel sounds, no guarding. EXTREMITIES: No edema, No cyanosis, no clubbing MUSCULOSKELETAL:  No deformity  SKIN: Warm and dry NEUROLOGIC:  Alert and oriented x 3, non-focal PSYCHIATRIC:  Normal affect, good  insight  ASSESSMENT:    1. Precordial pain   2. Abnormal electrocardiogram   3. DOE (dyspnea on exertion)   4. Hypertensive heart disease, unspecified whether heart failure present   5. Pre-procedure lab exam   6. Morbid obesity (HCC)    PLAN:    Chest pain with abnormal EKG, the symptoms chest pain is concerning, this patient does have intermediate risk for coronary artery disease and  at this time I would like to pursue an ischemic evaluation in this patient.  Shared decision a coronary CTA at this time is appropriate.  I have discussed with the patient about the testing.  The patient has no IV contrast allergy and is agreeable to proceed with this test. Intermittent chest pain with abnormal EKG suggests possible coronary artery disease. Further evaluation required.  For dyspnea on exertion and longstanding hypertension will concern for understanding if there is any structural abnormalities or diastolic dysfunction we will get an echocardiogram  Essential hypertension, uncontrolled Blood pressure uncontrolled on current lisinopril  regimen. Requires tighter control especially given history of cerebral aneurysm. - Increase lisinopril  to 10 mg twice daily. - Send refill for lisinopril  prescription.  Cerebral aneurysm Blood pressure control critical to prevent aneurysm complications. - Ensure tight control of blood pressure with adjusted lisinopril  regimen.  She follows with neurology and neurosurgery  Varicose veins of left lower extremity Varicose veins present, likely due to venous insufficiency. No significant pain or swelling unless prolonged standing.  The patient understands the need to lose weight with diet and exercise. We have discussed specific strategies for this.  The patient is in agreement with the above plan. The patient left the office in stable condition.  The patient will follow up in   Medication Adjustments/Labs and Tests Ordered: Current medicines are reviewed  at length with the patient today.  Concerns regarding medicines are outlined above.  Orders Placed This Encounter  Procedures   CT CORONARY MORPH W/CTA COR W/SCORE W/CA W/CM &/OR WO/CM   Basic Metabolic Panel (BMET)   Magnesium   ECHOCARDIOGRAM COMPLETE   Meds ordered this encounter  Medications   metoprolol tartrate (LOPRESSOR) 100 MG tablet    Sig: Take 2 hours prior to CT    Dispense:  1 tablet    Refill:  0   lisinopril  (ZESTRIL ) 10 MG tablet    Sig: Take 1 tablet (10 mg total) by mouth 2 (two) times daily.    Dispense:  90 tablet    Refill:  3    Patient Instructions  Medication Instructions:  Your physician has recommended you make the following change in your medication:  INCREASE: Lisinopril  10 mg twice daily  Please take your blood pressure daily for 2 weeks and send in a MyChart message. Please include heart rates. (One message at the end of the 2 weeks).   HOW TO TAKE YOUR BLOOD PRESSURE: Rest 5 minutes before taking your blood pressure. Don't smoke or drink caffeinated beverages for at least 30 minutes before. Take your blood pressure before (not after) you eat. Sit comfortably with your back supported and both feet on the floor (don't cross your legs). Elevate your arm to heart level on a table or a desk. Use the proper sized cuff. It should fit smoothly and snugly around your bare upper arm. There should be enough room to slip a fingertip under the cuff. The bottom edge of the cuff should be 1 inch above the crease of the elbow. Ideally, take 3 measurements at one sitting and record the average. *If you need a refill on your cardiac medications before your next appointment, please call your pharmacy*  Lab Work: BMET, Mag If you have labs (blood work) drawn today and your tests are completely normal, you will receive your results only by: MyChart Message (if you have MyChart) OR A paper copy in the mail If you have any lab test that is abnormal or we need to  change your treatment, we will call you to review the results.  Testing/Procedures: Your physician has requested that you have an echocardiogram. Echocardiography is a painless test that uses sound waves to create images of your heart. It provides your doctor with information about the size and shape of your heart and how well your heart's chambers and valves are working. This procedure takes approximately one hour. There are no restrictions for this procedure. Please do NOT wear cologne, perfume, aftershave, or lotions (deodorant is allowed). Please arrive 15 minutes prior to your appointment time.  Please note: We ask at that you not bring children with you during ultrasound (echo/ vascular) testing. Due to room size and safety concerns, children are not allowed in the ultrasound rooms during exams. Our front office staff cannot provide observation of children in our lobby area while testing is being conducted. An adult accompanying a patient to their appointment will only be allowed in the ultrasound room at the discretion of the ultrasound technician under special circumstances. We apologize for any inconvenience.    Your cardiac CT will be scheduled at one of the below locations:   [facility] [address]  [place], [place] [address] [phone]  If scheduled at the Heart and Vascular Tower at [facility], please enter the parking lot using the [address] entrance and use the FREE valet service at the patient drop-off area. Enter the building and check-in with registration on the main floor.    Please follow these instructions carefully (unless otherwise directed):  An IV will be required for this test and Nitroglycerin will be given.   On the Night Before the Test: Be sure to Drink plenty of water . Do not consume any caffeinated/decaffeinated beverages or chocolate 12 hours prior to your test. Do not take any antihistamines 12 hours prior  to your test.   On the Day of the Test: Drink plenty of water  until 1 hour prior to the test. Do not eat any food 1 hour prior to test. You may take your regular medications prior to the test.  Take metoprolol (Lopressor) two hours prior to test. If you take Furosemide/Hydrochlorothiazide/Spironolactone/Chlorthalidone, please HOLD on the morning of the test. Patients who wear a continuous glucose monitor MUST remove the device  prior to scanning. FEMALES- please wear underwire-free bra if available, avoid dresses & tight clothing      After the Test: Drink plenty of water . After receiving IV contrast, you may experience a mild flushed feeling. This is normal. On occasion, you may experience a mild rash up to 24 hours after the test. This is not dangerous. If this occurs, you can take Benadryl  25 mg, Zyrtec, Claritin, or Allegra and increase your fluid intake. (Patients taking Tikosyn should avoid Benadryl , and may take Zyrtec, Claritin, or Allegra) If you experience trouble breathing, this can be serious. If it is severe call 911 IMMEDIATELY. If it is mild, please call our office.  We will call to schedule your test 2-4 weeks out understanding that some insurance companies will need an authorization prior to the service being performed.   For more information and frequently asked questions, please visit our website : [email or web address]  For non-scheduling related questions, please contact the cardiac imaging nurse navigator should you have any questions/concerns: Cardiac Imaging Nurse Navigators Direct Office Dial: [phone]   For scheduling needs, including cancellations and rescheduling, please call [place], [phone].   Follow-Up: At [facility], you and your health needs  are our priority.  As part of our continuing mission to provide you with exceptional heart care, our providers are all part of one team.  This team includes your primary Cardiologist  (physician) and Advanced Practice Providers or APPs (Physician Assistants and Nurse Practitioners) who all work together to provide you with the care you need, when you need it.  Your next appointment:   16 week(s)  Provider:   Chariti Havel, DO               Adopting a Healthy Lifestyle.  Know what a healthy weight is for you (roughly BMI <25) and aim to maintain this   Aim for 7+ servings of fruits and vegetables daily   65-80+ fluid ounces of water  or unsweet tea for healthy kidneys   Limit to max 1 drink of alcohol per day; avoid smoking/tobacco   Limit animal fats in diet for cholesterol and heart health - choose grass fed whenever available   Avoid highly processed foods, and foods high in saturated/trans fats   Aim for low stress - take time to unwind and care for your mental health   Aim for 150 min of moderate intensity exercise weekly for heart health, and weights twice weekly for bone health   Aim for 7-9 hours of sleep daily   When it comes to diets, agreement about the perfect plan isnt easy to find, even among the experts. Experts at the [facility] [employer] developed an idea known as the Healthy Eating Plate. Just imagine a plate divided into logical, healthy portions.   The emphasis is on diet quality:   Load up on vegetables and fruits - one-half of your plate: Aim for color and variety, and remember that potatoes dont count.   Go for whole grains - one-quarter of your plate: Whole wheat, barley, wheat berries, quinoa, oats, brown rice, and foods made with them. If you want pasta, go with whole wheat pasta.   Protein power - one-quarter of your plate: Fish, chicken, beans, and nuts are all healthy, versatile protein sources. Limit red meat.   The diet, however, does go beyond the plate, offering a few other suggestions.   Use healthy plant oils, such as olive, canola, soy, corn, sunflower and peanut. Check the labels, and avoid partially  hydrogenated oil, which have unhealthy trans fats.   If youre thirsty, drink water . Coffee and tea are good in moderation, but skip sugary drinks and limit milk and dairy products to one or two daily servings.   The type of carbohydrate in the diet is more important than the amount. Some sources of carbohydrates, such as vegetables, fruits, whole grains, and beans-are healthier than others.   Finally, stay active  Signed, Dub Huntsman, DO  08/01/2024 1:11 PM    [facility]

## 2024-08-01 NOTE — Patient Instructions (Addendum)
 Medication Instructions:  Your physician has recommended you make the following change in your medication:  INCREASE: Lisinopril  10 mg twice daily  Please take your blood pressure daily for 2 weeks and send in a MyChart message. Please include heart rates. (One message at the end of the 2 weeks).   HOW TO TAKE YOUR BLOOD PRESSURE: Rest 5 minutes before taking your blood pressure. Don't smoke or drink caffeinated beverages for at least 30 minutes before. Take your blood pressure before (not after) you eat. Sit comfortably with your back supported and both feet on the floor (don't cross your legs). Elevate your arm to heart level on a table or a desk. Use the proper sized cuff. It should fit smoothly and snugly around your bare upper arm. There should be enough room to slip a fingertip under the cuff. The bottom edge of the cuff should be 1 inch above the crease of the elbow. Ideally, take 3 measurements at one sitting and record the average. *If you need a refill on your cardiac medications before your next appointment, please call your pharmacy*  Lab Work: BMET, Mag If you have labs (blood work) drawn today and your tests are completely normal, you will receive your results only by: MyChart Message (if you have MyChart) OR A paper copy in the mail If you have any lab test that is abnormal or we need to change your treatment, we will call you to review the results.  Testing/Procedures: Your physician has requested that you have an echocardiogram. Echocardiography is a painless test that uses sound waves to create images of your heart. It provides your doctor with information about the size and shape of your heart and how well your heart's chambers and valves are working. This procedure takes approximately one hour. There are no restrictions for this procedure. Please do NOT wear cologne, perfume, aftershave, or lotions (deodorant is allowed). Please arrive 15 minutes prior to your  appointment time.  Please note: We ask at that you not bring children with you during ultrasound (echo/ vascular) testing. Due to room size and safety concerns, children are not allowed in the ultrasound rooms during exams. Our front office staff cannot provide observation of children in our lobby area while testing is being conducted. An adult accompanying a patient to their appointment will only be allowed in the ultrasound room at the discretion of the ultrasound technician under special circumstances. We apologize for any inconvenience.    Your cardiac CT will be scheduled at one of the below locations:   [facility] [address]  [place], [place] [address] [phone]  If scheduled at the [facility] at [facility] street, please enter the parking lot using the [address] entrance and use the FREE valet service at the patient drop-off area. Enter the building and check-in with registration on the main floor.    Please follow these instructions carefully (unless otherwise directed):  An IV will be required for this test and Nitroglycerin will be given.   On the Night Before the Test: Be sure to Drink plenty of water . Do not consume any caffeinated/decaffeinated beverages or chocolate 12 hours prior to your test. Do not take any antihistamines 12 hours prior to your test.   On the Day of the Test: Drink plenty of water  until 1 hour prior to the test. Do not eat any food 1 hour prior to test. You may take your regular medications prior to the  test.  Take metoprolol (Lopressor) two hours prior to test. If you take Furosemide/Hydrochlorothiazide/Spironolactone/Chlorthalidone, please HOLD on the morning of the test. Patients who wear a continuous glucose monitor MUST remove the device  prior to scanning. FEMALES- please wear underwire-free bra if available, avoid dresses & tight clothing      After the Test: Drink plenty of  water . After receiving IV contrast, you may experience a mild flushed feeling. This is normal. On occasion, you may experience a mild rash up to 24 hours after the test. This is not dangerous. If this occurs, you can take Benadryl  25 mg, Zyrtec, Claritin, or Allegra and increase your fluid intake. (Patients taking Tikosyn should avoid Benadryl , and may take Zyrtec, Claritin, or Allegra) If you experience trouble breathing, this can be serious. If it is severe call 911 IMMEDIATELY. If it is mild, please call our office.  We will call to schedule your test 2-4 weeks out understanding that some insurance companies will need an authorization prior to the service being performed.   For more information and frequently asked questions, please visit our website : [email or web address]  For non-scheduling related questions, please contact the cardiac imaging nurse navigator should you have any questions/concerns: Cardiac Imaging Nurse Navigators Direct Office Dial: [phone]   For scheduling needs, including cancellations and rescheduling, please call [place], [phone].   Follow-Up: At [facility], you and your health needs are our priority.  As part of our continuing mission to provide you with exceptional heart care, our providers are all part of one team.  This team includes your primary Cardiologist (physician) and Advanced Practice Providers or APPs (Physician Assistants and Nurse Practitioners) who all work together to provide you with the care you need, when you need it.  Your next appointment:   16 week(s)  Provider:   Kardie Tobb, DO

## 2024-08-02 LAB — BASIC METABOLIC PANEL WITH GFR
BUN/Creatinine Ratio: 15 (ref 12–28)
BUN: 9 mg/dL (ref 8–27)
CO2: 21 mmol/L (ref 20–29)
Calcium: 9.3 mg/dL (ref 8.7–10.3)
Chloride: 105 mmol/L (ref 96–106)
Creatinine, Ser: 0.59 mg/dL (ref 0.57–1.00)
Glucose: 82 mg/dL (ref 70–99)
Potassium: 4.1 mmol/L (ref 3.5–5.2)
Sodium: 141 mmol/L (ref 134–144)
eGFR: 101 mL/min/1.73 (ref >59)

## 2024-08-02 LAB — MAGNESIUM: Magnesium: 2.4 mg/dL — ABNORMAL HIGH (ref 1.6–2.3)

## 2024-08-07 ENCOUNTER — Encounter (HOSPITAL_COMMUNITY): Payer: Self-pay

## 2024-08-09 ENCOUNTER — Ambulatory Visit (HOSPITAL_COMMUNITY)
Admission: RE | Admit: 2024-08-09 | Discharge: 2024-08-09 | Disposition: A | Discharge status: Home / Self Care | Payer: Self-pay | Source: Ambulatory Visit | Attending: Cardiology | Admitting: Cardiology

## 2024-08-09 DIAGNOSIS — R9431 Abnormal electrocardiogram [ECG] [EKG]: Secondary | ICD-10-CM | POA: Insufficient documentation

## 2024-08-09 DIAGNOSIS — R072 Precordial pain: Secondary | ICD-10-CM

## 2024-08-09 MED ORDER — NITROGLYCERIN 0.4 MG SL SUBL
0.8000 mg | SUBLINGUAL_TABLET | Freq: Once | SUBLINGUAL | Status: AC
  Administered 2024-08-09: 0.8 mg via SUBLINGUAL

## 2024-08-09 MED ORDER — IOHEXOL 350 MG/ML SOLN
100.0000 mL | Freq: Once | INTRAVENOUS | Status: AC | PRN
  Administered 2024-08-09: 100 mL via INTRAVENOUS

## 2024-08-11 ENCOUNTER — Telehealth: Payer: Self-pay | Admitting: *Deleted

## 2024-08-11 NOTE — Telephone Encounter (Signed)
 Team,  This pt cannot be cleared until the w/u for abnl EKG and CP is completed.  Thanks,  Norleen EMERSON Schillings

## 2024-08-12 NOTE — Telephone Encounter (Signed)
 Spoke with patients daughter. Made her aware of the recommendations. ECHO looks to be scheduled in November. They are aware after this procedure is preformed they can call back to be r/s. PV and colonoscopy have been cancelled at this time.

## 2024-08-13 ENCOUNTER — Ambulatory Visit: Payer: Self-pay | Admitting: Cardiology

## 2024-08-23 ENCOUNTER — Ambulatory Visit: Admitting: Neurology

## 2024-08-30 ENCOUNTER — Ambulatory Visit (INDEPENDENT_AMBULATORY_CARE_PROVIDER_SITE_OTHER): Payer: Self-pay | Admitting: Neurology

## 2024-08-30 ENCOUNTER — Other Ambulatory Visit: Payer: Self-pay

## 2024-08-30 ENCOUNTER — Other Ambulatory Visit: Payer: Self-pay | Admitting: Family Medicine

## 2024-08-30 DIAGNOSIS — G5131 Clonic hemifacial spasm, right: Secondary | ICD-10-CM

## 2024-08-30 DIAGNOSIS — K219 Gastro-esophageal reflux disease without esophagitis: Secondary | ICD-10-CM

## 2024-08-30 DIAGNOSIS — G245 Blepharospasm: Secondary | ICD-10-CM

## 2024-08-30 MED ORDER — INCOBOTULINUMTOXINA 50 UNITS IM SOLR
50.0000 [IU] | INTRAMUSCULAR | Status: AC
  Administered 2024-08-30: 50 [IU] via INTRAMUSCULAR

## 2024-08-30 MED ORDER — OMEPRAZOLE 40 MG PO CPDR
40.0000 mg | DELAYED_RELEASE_CAPSULE | Freq: Every day | ORAL | 1 refills | Status: AC
  Filled 2024-08-30: qty 90, 90d supply, fill #0

## 2024-08-30 NOTE — Procedures (Signed)
 [facility]   History:  Diagnosis: blepharospasm (significantly more blepharospasm today than last visit, although the right is certainly more than the left)   Result History  She initially did not think it was not helpful last visit, but today comes in and states that it was markedly helpful and she is really only having spasm when she lays the right side of the face on her pillow at night  Consent obtained from: The patient (translated by medical translator, who is present) Benefits discussed included, but were not limited to decreased muscle tightness, increased joint range of motion, and decreased pain.  Risk discussed included, but were not limited pain and discomfort, bleeding, bruising, excessive weakness, venous thrombosis, muscle atrophy and dysphagia.  A copy of the patient medication guide was given to the patient which explains the blackbox warning.  Patients identity and treatment sites confirmed Yes.  .  Details of Procedure: Skin was cleaned with alcohol.  A 27-gauge, half inch needle was injected into the target muscle.  Prior to injection, the needle plunger was aspirated to make sure the needle was not within a blood vessel.  There was no blood retrieved on aspiration.    Following is a summary of the muscles injected  And the amount of incobotulinumtoxin A used:  Injections  Location Left  Right Units Number of sites        Corrugator 2.5 2.5 5   Procerus   5   Lower Lid, Lateral orbicularis oculi  2.5 2.5   Lower Lid Medial orbicularis oculi      Upper Lid, Lateral orbicularis oculi, pretarsal  2.5 2.5   Upper Lid, Medial orbiularis oculi pretarsal  2.5 2.5   Orbicularis oculi, upper, lateral  2.5 2.5   Orbicularis oculi, lower, lateral  2.5 2.5   Canthus 5.0 5.0 10 1  nasalis  2.5 2.5   Masseter      Procerus      Zygomaticus Major 2.5/2.5 2.5x4 15   TOTAL UNITS:   50    Agent: Incobotulinumtoxin A (Xeomin ).  1 vials of Botox were used, each containing  50 units and freshly diluted with 2 mL of sterile, non-perserved saline   Total injected (Units): 50  Total wasted (Units): 0 Pt tolerated procedure well without complications.   Reinjection is anticipated in 3 months.

## 2024-09-09 ENCOUNTER — Other Ambulatory Visit: Payer: Self-pay

## 2024-09-10 ENCOUNTER — Telehealth: Payer: Self-pay | Admitting: Cardiology

## 2024-09-10 ENCOUNTER — Other Ambulatory Visit: Payer: Self-pay

## 2024-09-10 ENCOUNTER — Ambulatory Visit (HOSPITAL_COMMUNITY)
Admission: RE | Admit: 2024-09-10 | Discharge: 2024-09-10 | Disposition: A | Discharge status: Home / Self Care | Payer: Self-pay | Source: Ambulatory Visit | Attending: Cardiovascular Disease | Admitting: Cardiovascular Disease

## 2024-09-10 DIAGNOSIS — I119 Hypertensive heart disease without heart failure: Secondary | ICD-10-CM

## 2024-09-10 LAB — ECHOCARDIOGRAM COMPLETE
Area-P 1/2: 4.41 cm2
S' Lateral: 2.9 cm

## 2024-09-10 NOTE — Telephone Encounter (Signed)
 Paper Work Dropped Off: Patient dropped off blood pressure log from the last 2 weeks   Date: 09-10-2024  Location of paper:  Tobb's mailbox

## 2024-09-13 ENCOUNTER — Ambulatory Visit: Admitting: Neurology

## 2024-10-04 ENCOUNTER — Encounter: Admitting: Pediatrics

## 2024-10-11 NOTE — Progress Notes (Unsigned)
 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  This was very helpful -she does have R facial droop and discussed that both hemifacial spasm and toxin can do this.  She knows this.  Told her we could try and even up the other side but there are risks to that and she is not drooling, having food come out the mouth, etc so we will continue as is for now  2.  Supraclinoid aneurysm, left  -has seen dr lester. Subjective:   Susan Lopez was seen today in follow up for hemifacial spasm, last injections of Botox being August 30, 2024. Medical translator present. When I initially saw her after her last injection, she did not think it was that helpful.  However, at repeat injection, she noted that the injection was significantly helping and she was really only having spasm when she layed on the right side of her face.  This last botox was very helpful.  She no longer has the spasm when laying on the right side.      CURRENT MEDICATIONS:  Outpatient Encounter Medications as of 10/15/2024  Medication Sig   aspirin  EC 81 MG tablet Take 1 tablet (81 mg total) by mouth daily. Swallow whole.   atorvastatin  (LIPITOR) 40 MG tablet Take 1 tablet (40 mg total) by mouth daily.   fluticasone  (FLONASE ) 50 MCG/ACT nasal spray Place 2 sprays into both nostrils daily.   gabapentin  (NEURONTIN ) 300 MG capsule Take 1 capsule (300 mg total) by mouth 3 (three) times daily.   lisinopril  (ZESTRIL ) 10 MG tablet Take 1 tablet (10 mg total) by mouth 2 (two) times daily.   magnesium oxide (MAG-OX) 400 (240 Mg) MG tablet Take 250 mg by mouth daily.   meloxicam  (MOBIC ) 7.5 MG tablet Take 1 tablet (7.5 mg total) by mouth daily.   metoprolol  tartrate (LOPRESSOR ) 100 MG tablet Take 2 hours prior to CT   Multiple Vitamin (MULTIVITAMIN) tablet Take 1 tablet by mouth daily.   omeprazole  (PRILOSEC) 40 MG capsule TOME 1 PASTILLA POR VIA ORAL DOS VECES AL  DIA A REDUCIR EL ACIDO DEL ESTOMAGO   Facility-Administered Encounter Medications as of 10/15/2024  Medication   incobotulinumtoxinA  (XEOMIN ) 50 units injection 50 Units   incobotulinumtoxinA  (XEOMIN ) 50 units injection 50 Units     Objective:   PHYSICAL EXAMINATION:    VITALS:   Vitals:   10/15/24 1041  BP: 125/78  Pulse: 78  SpO2: 96%  Weight: 171 lb 9.6 oz (77.8 kg)     GEN:  The patient appears stated age and is in NAD. HEENT:  Normocephalic, atraumatic.  The mucous membranes are moist. The superficial temporal arteries are without ropiness or tenderness. CV:  RRR Lungs:  CTAB Neck/HEME:  There are no carotid bruits bilaterally.  Neurological examination:  Orientation: The patient is alert and oriented x3. Cranial nerves: There is R facial droop.  She has decreased NL fold on the R..   She does not speak this examiner's language to assess fluency and clarity.. Soft palate rises symmetrically and there is no tongue deviation. Hearing is intact to conversational tone.  She has no hemifacial spasm today Sensation: Sensation is intact to light touch throughout Motor: Strength is at least antigravity x4.  Movement examination: Tone: There is normal tone in the UE/LE Abnormal movements:  no tremor.  No myoclonus.  No asterixis.   Coordination:  There is no decremation with RAM's.  Gait and Station: The patient has no difficulty arising out of a deep-seated chair without the use of the hands. The patient's stride length is good.      Cc:  Newlin, Enobong, MD

## 2024-10-15 ENCOUNTER — Ambulatory Visit: Payer: Self-pay | Admitting: Neurology

## 2024-10-15 VITALS — BP 125/78 | HR 78 | Wt 171.6 lb

## 2024-10-15 DIAGNOSIS — G5131 Clonic hemifacial spasm, right: Secondary | ICD-10-CM

## 2024-11-29 ENCOUNTER — Ambulatory Visit: Payer: Self-pay | Admitting: Neurology

## 2024-11-29 DIAGNOSIS — G245 Blepharospasm: Secondary | ICD-10-CM

## 2024-11-29 DIAGNOSIS — G5131 Clonic hemifacial spasm, right: Secondary | ICD-10-CM

## 2024-11-29 MED ORDER — INCOBOTULINUMTOXINA 50 UNITS IM SOLR
50.0000 [IU] | INTRAMUSCULAR | Status: AC
  Administered 2024-11-29: 50 [IU] via INTRAMUSCULAR

## 2024-11-29 NOTE — Procedures (Signed)
 [facility]   History:  Diagnosis: blepharospasm (significantly more blepharospasm today than last visit, although the right is certainly more than the left)   Result History  Doing really well; spasms started coming back a few days ago  Consent obtained from: The patient (translated by medical translator, who is present) Benefits discussed included, but were not limited to decreased muscle tightness, increased joint range of motion, and decreased pain.  Risk discussed included, but were not limited pain and discomfort, bleeding, bruising, excessive weakness, venous thrombosis, muscle atrophy and dysphagia.  A copy of the patient medication guide was given to the patient which explains the blackbox warning.  Patients identity and treatment sites confirmed Yes.  .  Details of Procedure: Skin was cleaned with alcohol.  A 27-gauge, half inch needle was injected into the target muscle.  Prior to injection, the needle plunger was aspirated to make sure the needle was not within a blood vessel.  There was no blood retrieved on aspiration.    Following is a summary of the muscles injected  And the amount of incobotulinumtoxin A used:  Injections  Location Left  Right Units Number of sites        Corrugator 2.5 2.5 5   Procerus   5   Lower Lid, Lateral orbicularis oculi  2.5 2.5   Lower Lid Medial orbicularis oculi      Upper Lid, Lateral orbicularis oculi, pretarsal  2.5 2.5   Upper Lid, Medial orbiularis oculi pretarsal  2.5 2.5   Orbicularis oculi, upper, lateral  2.5 2.5   Orbicularis oculi, lower, lateral  2.5 2.5   Canthus 5.0 5.0 10 1  nasalis  2.5 2.5   Masseter      Procerus      Zygomaticus Major 2.5/2.5 2.5x4 15   TOTAL UNITS:   50    Agent: Incobotulinumtoxin A (Xeomin ).  1 vials of Botox were used, each containing 50 units and freshly diluted with 2 mL of sterile, non-perserved saline   Total injected (Units): 50  Total wasted (Units): 0 Pt tolerated procedure well  without complications.   Reinjection is anticipated in 3 months.

## 2024-12-03 ENCOUNTER — Other Ambulatory Visit: Payer: Self-pay

## 2024-12-03 ENCOUNTER — Ambulatory Visit: Payer: Self-pay | Admitting: Cardiology

## 2025-01-07 ENCOUNTER — Ambulatory Visit: Admitting: Neurology

## 2025-02-27 ENCOUNTER — Ambulatory Visit: Payer: Self-pay | Admitting: Cardiology

## 2025-02-28 ENCOUNTER — Ambulatory Visit: Payer: Self-pay | Admitting: Neurology

## 2025-06-03 ENCOUNTER — Ambulatory Visit: Admitting: Family Medicine

## 2025-07-10 ENCOUNTER — Other Ambulatory Visit (HOSPITAL_COMMUNITY): Payer: Self-pay

## 2025-07-17 ENCOUNTER — Ambulatory Visit: Admitting: Neuroradiology
